# Patient Record
Sex: Male | Born: 1960 | Race: Black or African American | Hispanic: No | Marital: Single | Smoking: Current every day smoker
Health system: Southern US, Community
[De-identification: ages and names within clinical notes are randomized; demographics above are authoritative.]

## PROBLEM LIST (undated history)

## (undated) DIAGNOSIS — F209 Schizophrenia, unspecified: Secondary | ICD-10-CM

## (undated) DIAGNOSIS — I10 Essential (primary) hypertension: Secondary | ICD-10-CM

## (undated) DIAGNOSIS — J449 Chronic obstructive pulmonary disease, unspecified: Secondary | ICD-10-CM

---

## 2002-07-06 ENCOUNTER — Emergency Department (HOSPITAL_COMMUNITY): Admission: EM | Admit: 2002-07-06 | Discharge: 2002-07-06 | Payer: Self-pay | Admitting: Diagnostic Radiology

## 2002-07-06 ENCOUNTER — Encounter: Payer: Self-pay | Admitting: Emergency Medicine

## 2002-10-04 ENCOUNTER — Ambulatory Visit (HOSPITAL_COMMUNITY): Admission: RE | Admit: 2002-10-04 | Discharge: 2002-10-04 | Payer: Self-pay | Admitting: Neurology

## 2002-10-04 ENCOUNTER — Encounter: Payer: Self-pay | Admitting: Neurology

## 2002-10-21 ENCOUNTER — Encounter: Admission: RE | Admit: 2002-10-21 | Discharge: 2002-11-05 | Payer: Self-pay | Admitting: Neurology

## 2003-07-27 ENCOUNTER — Emergency Department (HOSPITAL_COMMUNITY): Admission: EM | Admit: 2003-07-27 | Discharge: 2003-07-27 | Payer: Self-pay | Admitting: Emergency Medicine

## 2004-01-07 ENCOUNTER — Emergency Department (HOSPITAL_COMMUNITY): Admission: EM | Admit: 2004-01-07 | Discharge: 2004-01-08 | Payer: Self-pay | Admitting: Emergency Medicine

## 2004-03-26 ENCOUNTER — Inpatient Hospital Stay (HOSPITAL_COMMUNITY): Admission: EM | Admit: 2004-03-26 | Discharge: 2004-03-31 | Payer: Self-pay | Admitting: Emergency Medicine

## 2004-03-29 ENCOUNTER — Ambulatory Visit: Payer: Self-pay | Admitting: Internal Medicine

## 2004-08-13 ENCOUNTER — Emergency Department (HOSPITAL_COMMUNITY): Admission: EM | Admit: 2004-08-13 | Discharge: 2004-08-13 | Payer: Self-pay | Admitting: Emergency Medicine

## 2006-06-28 ENCOUNTER — Ambulatory Visit: Payer: Self-pay | Admitting: Gastroenterology

## 2006-07-06 ENCOUNTER — Ambulatory Visit (HOSPITAL_COMMUNITY): Admission: RE | Admit: 2006-07-06 | Discharge: 2006-07-06 | Payer: Self-pay | Admitting: Gastroenterology

## 2006-07-10 ENCOUNTER — Ambulatory Visit: Payer: Self-pay | Admitting: Gastroenterology

## 2006-08-23 ENCOUNTER — Ambulatory Visit: Payer: Self-pay | Admitting: Gastroenterology

## 2006-08-30 ENCOUNTER — Ambulatory Visit (HOSPITAL_COMMUNITY): Admission: RE | Admit: 2006-08-30 | Discharge: 2006-08-30 | Payer: Self-pay | Admitting: Gastroenterology

## 2006-09-18 ENCOUNTER — Ambulatory Visit (HOSPITAL_COMMUNITY): Admission: RE | Admit: 2006-09-18 | Discharge: 2006-09-18 | Payer: Self-pay | Admitting: Gastroenterology

## 2006-10-10 ENCOUNTER — Ambulatory Visit: Payer: Self-pay | Admitting: Gastroenterology

## 2009-08-13 ENCOUNTER — Emergency Department (HOSPITAL_COMMUNITY): Admission: EM | Admit: 2009-08-13 | Discharge: 2009-08-13 | Payer: Self-pay | Admitting: Emergency Medicine

## 2010-07-28 ENCOUNTER — Emergency Department (HOSPITAL_COMMUNITY)
Admission: EM | Admit: 2010-07-28 | Discharge: 2010-07-28 | Disposition: A | Discharge status: Home / Self Care | Payer: Medicaid Other | Attending: Emergency Medicine | Admitting: Emergency Medicine

## 2010-07-28 DIAGNOSIS — M545 Low back pain, unspecified: Secondary | ICD-10-CM | POA: Insufficient documentation

## 2010-07-28 LAB — DIFFERENTIAL
Basophils Absolute: 0.1 10*3/uL (ref 0.0–0.1)
Basophils Relative: 1 % (ref 0–1)
Eosinophils Absolute: 0.1 10*3/uL (ref 0.0–0.7)
Eosinophils Relative: 1 % (ref 0–5)
Lymphocytes Relative: 25 % (ref 12–46)
Lymphs Abs: 2.5 10*3/uL (ref 0.7–4.0)
Monocytes Absolute: 0.8 10*3/uL (ref 0.1–1.0)
Monocytes Relative: 8 % (ref 3–12)
Neutro Abs: 6.5 10*3/uL (ref 1.7–7.7)
Neutrophils Relative %: 65 % (ref 43–77)

## 2010-07-28 LAB — URINALYSIS, ROUTINE W REFLEX MICROSCOPIC
Bilirubin Urine: NEGATIVE
Glucose, UA: 100 mg/dL — AB
Glucose, UA: NEGATIVE mg/dL
Hgb urine dipstick: NEGATIVE
Hgb urine dipstick: NEGATIVE
Ketones, ur: 15 mg/dL — AB
Ketones, ur: 15 mg/dL — AB
Leukocytes, UA: NEGATIVE
Nitrite: NEGATIVE
Nitrite: NEGATIVE
Protein, ur: NEGATIVE mg/dL
Protein, ur: 30 mg/dL — AB
Specific Gravity, Urine: 1.017 (ref 1.005–1.030)
Specific Gravity, Urine: 1.03 (ref 1.005–1.030)
Urobilinogen, UA: 1 mg/dL (ref 0.0–1.0)
Urobilinogen, UA: 1 mg/dL (ref 0.0–1.0)
pH: 6 (ref 5.0–8.0)
pH: 6.5 (ref 5.0–8.0)

## 2010-07-28 LAB — CBC
HCT: 47.5 % (ref 39.0–52.0)
Hemoglobin: 15.5 g/dL (ref 13.0–17.0)
MCHC: 32.7 g/dL (ref 30.0–36.0)
MCV: 93.6 fL (ref 78.0–100.0)
Platelets: 124 10*3/uL — ABNORMAL LOW (ref 150–400)
RBC: 5.07 MIL/uL (ref 4.22–5.81)
RDW: 14.2 % (ref 11.5–15.5)
WBC: 10 10*3/uL (ref 4.0–10.5)

## 2010-07-28 LAB — POCT I-STAT, CHEM 8
BUN: 11 mg/dL (ref 6–23)
Calcium, Ion: 1.28 mmol/L (ref 1.12–1.32)
Chloride: 109 mEq/L (ref 96–112)
Creatinine, Ser: 1.2 mg/dL (ref 0.4–1.5)
Glucose, Bld: 88 mg/dL (ref 70–99)
HCT: 52 % (ref 39.0–52.0)
Hemoglobin: 17.7 g/dL — ABNORMAL HIGH (ref 13.0–17.0)
Potassium: 4.5 mEq/L (ref 3.5–5.1)
Sodium: 145 mEq/L (ref 135–145)
TCO2: 29 mmol/L (ref 0–100)

## 2010-07-28 LAB — URINE MICROSCOPIC-ADD ON

## 2010-07-28 LAB — VALPROIC ACID LEVEL: Valproic Acid Lvl: 96.4 ug/mL (ref 50.0–100.0)

## 2010-09-21 NOTE — Op Note (Signed)
NAMELENIX, KIDD             ACCOUNT NO.:  [id number]   MEDICAL RECORD NO.:  [id number]          PATIENT TYPE:  AMB   LOCATION:  ENDO                         FACILITY:  [facility]   PHYSICIAN:  Barbette Hair. Arlyce Dice, MD,FACGDATE OF BIRTH:  06/18/1960   DATE OF PROCEDURE:  09/18/2006  DATE OF DISCHARGE:  09/18/2006                               OPERATIVE REPORT   PROCEDURE:  Esophageal monometry.   HISTORY:  The patient has a history of heartburn, chest discomfort,  dysphagia, and nausea with vomiting.   Esophageal monometry was performed utilizing the usual pull through  technique.   FINDINGS:  LES resting pressure was 26.8 mmHg.  Percent relaxation was  59%.  Residual pressure was 10.9 with normal less than 8 mm.   In the esophageal body, there were 47% peristaltic contractions and 53%  retrograde contractions.  Most contractions were normal amplitude and  duration.  The UES upper esophageal sphincter pressure contractions  during relaxation were normal.   IMPRESSION:  Abnormal study with less than 50% peristaltic contractions  and incomplete relaxation of the lower esophageal sphincter.  Findings  raise the question of achalasia.      Barbette Hair. Arlyce Dice, MD,FACG  Electronically Signed     RDK/MEDQ  D:  10/05/2006  T:  10/05/2006  Job:  [id number]

## 2010-09-24 NOTE — H&P (Signed)
NAMEMOHD., DERFLINGER NO.:  [id number]   MEDICAL RECORD NO.:  [id number]          PATIENT TYPE:  EMS   LOCATION:  ED                           FACILITY:  [facility]   PHYSICIAN:  Michaelyn Barter, M.D. DATE OF BIRTH:  [birth year]   DATE OF ADMISSION:  03/25/2004  DATE OF DISCHARGE:                                HISTORY & PHYSICAL   PRIMARY CARE PHYSICIAN:  Dr. Britt Bottom Blunt.   CHIEF COMPLAINT:  Swollen right leg.   HISTORY OF PRESENT ILLNESS:  The patient is a 50 year old male with a past  medical history of schizophrenia who states that he noticed his right leg  swelling initially 2 days ago.  He denies having any trauma to the right  lower extremity.  However, he states that he does scratch the dorsal surface  of his right foot often.  He states that his distal right leg does not hurt  very much and said that he has never had similar swelling of the leg before.  He denies having any nausea.  No emesis.  No fevers or chills.  No diarrhea  or other changes in his bowel habits.  Again, the patient's primary care  physician is Dr. Mervyn Skeeters. Blunt.   PAST MEDICAL HISTORY:  Schizophrenia.   PAST SURGICAL HISTORY:  None.   ALLERGIES:  None.   FAMILY HISTORY:  Father:  No illnesses.  Mother:  No illnesses.   SOCIAL HISTORY:  Cigarettes:  15 cigarette sticks per day since 1980.  Alcohol:  The patient stopped in 1985.  Street drugs:  The patient denies.   HOME MEDICATIONS:  The patient cannot remember the names of his medications  for his schizophrenia.   REVIEW OF SYSTEMS:  As per HPI.  Otherwise all other systems are negative.   PHYSICAL EXAMINATION:  GENERAL:  No obvious distress.  VITAL SIGNS:  Upon admission, the patient's vitals were recorded as the  following:  Temperature 100.6, heart rate 95, respirations 18, SPO2 99% on  room air.  HEENT:  Muddy sclerae bilaterally.  NECK:  Supple, no lymphadenopathy.  Thyroid not palpable.  CARDIAC:  S1 and S2 present.   Regular rate and rhythm.  No murmurs, no  gallops, no rubs.  RESPIRATORY:  Breath sounds are clear bilaterally.  No crackles.  No  wheezes.  ABDOMEN:  Soft, nontender, nondistended.  Positive bowel sounds.  No  palpable organomegaly.  EXTREMITIES:  Right anterior and lateral distal leg is red/erythematous,  edematous, and warm to touch.  The area of erythema measures approximately  16 cm in length and again is pronounced over the anterior and lateral  surfaces of the right lower extremity.  The left leg does not have any  similar findings.  It appears to be normal in appearance.  The patient has  dry, scaly white plaque-like rash over both of his dorsal feet areas which  is more than likely eczema.   LABORATORY DATA:  White blood cell count 14.3.   ASSESSMENT AND PLAN:  Right leg cellulitis.  The patient received  ceftriaxone while in the emergency room.  Will initiate nafcillin  2 g IV  piggyback q.4h.  Will order blood cultures x2 and provide Tylenol extra-  strength 1-2 tablets p.o. q.8h. p.r.n. for pain.  Continue IV fluid  hydration also.     Orla   OR/MEDQ  D:  03/26/2004  T:  03/26/2004  Job:  [id number]   cc:   Dr. Britt Bottom Blunt

## 2010-09-24 NOTE — Letter (Signed)
June 28, 2006    [name]   RE:  BURLON, CENTRELLA  MRN:  [id number]  /  DOB:  [birth year]   Dear Windy Fast:   It is my pleasure to have treated you recently as a new patient in my  office.  I appreciate your confidence and the opportunity to participate  in your care.   Since I do have a busy inpatient endoscopy schedule and office schedule,  my office hours vary weekly.  I am, however, available for emergency  calls every day through my office.  If I cannot promptly meet an urgent  office appointment, another one of our gastroenterologists will be able  to assist you.   My well-trained staff are prepared to help you at all times.  For  emergencies after office hours, a physician from our gastroenterology  section is always available through my 24-hour answering service.   While you are under my care, I encourage discussion of your questions  and concerns, and I will be happy to return your calls as soon as I am  available.   Once again, I welcome you as a new patient and I look forward to a happy  and healthy relationship.    Sincerely,      Barbette Hair. Arlyce Dice, MD,FACG  Electronically Signed   RDK/MedQ  DD: 06/28/2006  DT: 06/28/2006  Job #: [id number]

## 2010-09-24 NOTE — Assessment & Plan Note (Signed)
[facility]                         GASTROENTEROLOGY OFFICE NOTE   GERAL, COKER                    MRN:          [id number]  DATE:08/23/2006                            DOB:          [birth year]    PROBLEM:  Dysphagia, wheeze.   HISTORY OF PRESENT ILLNESS:  Mr. Iglesia has returned following upper  endoscopy.  Exam demonstrated a distal esophageal stricture that was  dilated to 18 mm.  He also had mild esophagitis.  Mr. Vanover reports  that he is still having difficulty with swallowing.  I believe he is  describing dysphagia.  He also has frequent postprandial vomiting within  5 or 10 minutes or sometimes longer.  It is not preceded by nausea, per  se.  He denies abdominal pain.  He has lost 40 pounds over the last  several months.  He has also complained of some dizziness.   PHYSICAL EXAMINATION:  VITAL SIGNS:  Pulse 96, blood pressure 114.86,  weight 171.  ABDOMEN:  Is without masses, tenderness or organomegaly. There is no  succussion splash.   IMPRESSION:  Persistent dysphagia with immediate postprandial vomiting.  I am suspicious that this may be a functional disorder. Thick stricture  is unlikely as is a motility disorder. Finally, he could have  gastroparesis which ought to be ruled out.   RECOMMENDATIONS:  Barium swallow and gastric emptying scan.     Barbette Hair. Arlyce Dice, MD,FACG  Electronically Signed    RDK/MedQ  DD: 08/23/2006  DT: 08/23/2006  Job #: [id number]   cc:   Clyda Greener

## 2010-09-24 NOTE — Letter (Signed)
June 28, 2006    Clyda Greener, MD   RE:  NYHEIM, SEUFERT  MRN:  [id number]  /  DOB:  1961-04-06   Dear Dr. Bruna Potter:   Upon your kind referral, I had the pleasure of evaluating your patient  and I am pleased to offer my findings.  I saw Mark [name] in the  office today.  Enclosed is a copy of my progress note that details my  findings and recommendations.   Thank you for the opportunity to participate in your patient's care.    Sincerely,      Barbette Hair. Arlyce Dice, MD,FACG  Electronically Signed    RDK/MedQ  DD: 06/28/2006  DT: 06/28/2006  Job #: [id number]

## 2010-09-24 NOTE — Discharge Summary (Signed)
Mark [name], Mark [name]             ACCOUNT NO.:  [id number]   MEDICAL RECORD NO.:  [id number]          PATIENT TYPE:  INP   LOCATION:  0366                         FACILITY:  [facility]   PHYSICIAN:  Jonna L. Robb Matar, M.D.DATE OF BIRTH:  [birth year]   DATE OF ADMISSION:  03/26/2004  DATE OF DISCHARGE:  03/31/2004                                 DISCHARGE SUMMARY   PRIMARY CARE PHYSICIAN:  Dr. Bruna Potter.   FINAL DIAGNOSES:  1.  Right lower extremity cellulitis.  2.  Tenia pedis.  3.  Onychomycosis.  4.  Chronic schizophrenia.  5.  Drug induced Parkinson's disease.   CONSULTATIONS:  Cliffton Asters, M.D.   OPERATION:  None.   ALLERGIES:  None.   HISTORY OF PRESENT ILLNESS:  This 50 year old African-American male had a  two day history of right lower extremity swelling and pain. The patient also  has had a fungal infection on his feet for several weeks. He has had a  previous history of cellulitis in the right thigh in September.   PHYSICAL EXAMINATION:  Notable for Parkinson's tremor, an area on the right  shin that was red, swollen, tender and fluctuant.  Dry, scaly, fungal rash  on both feet right worse than left and bilateral onychomycosis as well.   HOSPITAL COURSE:  The patient was first started on ampicillin IV and then  changed to vancomycin.  He was started on Mycolog cream to the feet with  some improvement in both of these, however, the right shin continued to  remain swollen and tender, white count came down from 14.3 to 10.1.  Dr.  Orvan Falconer was called on infectious disease consult. He agreed with treating  the tinea but suggested using Lamisil instead of Diflucan and so it was  changed.  On November 22, the small abscess finally popped and drained with  decrease in the fluctuance and resolution of the cellulitis.  Gram stains  with Gram positive cocci in pairs.   DISPOSITION:  The patient will be discharged on his previous medications  which were clozapine, propranolol,  Prevacid, lithium, Depakote, Colace,  Zyprexa.  He should take Keflex 500 q.i.d. for a week and Lamisil 250 mg  q.d. for three months.  Prescriptions have been given to both of those.  He  should be seen in followup with mental health.  He can clean the localized area with peroxide, cover it with a nonstick pad  and will ask him to arrange for followup appointment with a regular medical  doctor within a week to double check on the culture.  If the abscess grows  out MRSA, he should be switched to doxycycline 100 mg b.i.d.      JLB/MEDQ  D:  03/31/2004  T:  03/31/2004  Job:  [id number]   cc:   Dr. Bruna Potter

## 2010-09-24 NOTE — Assessment & Plan Note (Signed)
[facility]                         GASTROENTEROLOGY OFFICE NOTE   SNYDER, COLAVITO                    MRN:          [id number]  DATE:06/28/2006                            DOB:          [birth year]    REFERRING PHYSICIAN:  Clyda Greener, MD   REASON FOR CONSULTATION:  Difficulty swallowing.   Mark [name] is a 50 year old African-American male referred through the  courtesy of Dr. Bruna Potter for evaluation. History is limited since the  patient is a poor historian. According to he and his father, he has been  having what sounds like dysphagia to solids. He has had regurgitation of  gastric contents and burning chest discomfort. He has lost 40 pounds in  the past 2-3 months due to poor appetite. They are unsure of his  medications though he is not aware of taking any stomach medicines.   PAST MEDICAL HISTORY:  Pertinent for schizophrenia.   FAMILY HISTORY:  Noncontributory.   MEDICATIONS:  Known medications include lithium, clonazepam, Depakote,  propranolol and Prevacid.   ALLERGIES:  Not known.   He smokes a pack a day, he does not drink. He is single and unemployed.   REVIEW OF SYSTEMS:  Positive for sore throat and back pain.   PHYSICAL EXAMINATION:  VITAL SIGNS:  Pulse 84, blood pressure 112/76,  weight 172.  HEENT: EOMI. PERRLA. Sclerae are anicteric.  Conjunctivae are pink.  NECK:  Supple without thyromegaly, adenopathy or carotid bruits.  CHEST:  Clear to auscultation and percussion without adventitious  sounds.  CARDIAC:  Regular rhythm; normal S1 S2.  There are no murmurs, gallops  or rubs.  ABDOMEN:  Bowel sounds are normoactive.  Abdomen is soft, non-tender and  non-distended.  There are no abdominal masses, tenderness, splenic  enlargement or hepatomegaly.  EXTREMITIES:  Full range of motion.  No cyanosis, clubbing or edema.  RECTAL:  Deferred.   IMPRESSION:  1. Possible dysphagia in the setting of gastroesophageal reflux      disease. A fixed stricture ought to be ruled out.  2. Symptomatic gastroesophageal reflux disease.   RECOMMENDATION:  Upper endoscopy with dilatation as indicated. I will  review his medications again when his father submits them at his next  exam and I will make changes accordingly.     Barbette Hair. Arlyce Dice, MD,FACG  Electronically Signed    RDK/MedQ  DD: 06/28/2006  DT: 06/28/2006  Job #: [id number]   cc:   Clyda Greener, MD

## 2011-04-21 ENCOUNTER — Emergency Department (HOSPITAL_COMMUNITY)
Admission: EM | Admit: 2011-04-21 | Discharge: 2011-04-21 | Disposition: A | Discharge status: Home / Self Care | Payer: Medicaid Other | Attending: Emergency Medicine | Admitting: Emergency Medicine

## 2011-04-21 ENCOUNTER — Emergency Department (HOSPITAL_COMMUNITY): Payer: Medicaid Other

## 2011-04-21 ENCOUNTER — Encounter: Payer: Self-pay | Admitting: *Deleted

## 2011-04-21 DIAGNOSIS — R3589 Other polyuria: Secondary | ICD-10-CM | POA: Insufficient documentation

## 2011-04-21 DIAGNOSIS — R358 Other polyuria: Secondary | ICD-10-CM | POA: Insufficient documentation

## 2011-04-21 DIAGNOSIS — Z79899 Other long term (current) drug therapy: Secondary | ICD-10-CM | POA: Insufficient documentation

## 2011-04-21 DIAGNOSIS — R631 Polydipsia: Secondary | ICD-10-CM | POA: Insufficient documentation

## 2011-04-21 DIAGNOSIS — I10 Essential (primary) hypertension: Secondary | ICD-10-CM | POA: Insufficient documentation

## 2011-04-21 DIAGNOSIS — R5383 Other fatigue: Secondary | ICD-10-CM

## 2011-04-21 DIAGNOSIS — Z8659 Personal history of other mental and behavioral disorders: Secondary | ICD-10-CM | POA: Insufficient documentation

## 2011-04-21 DIAGNOSIS — R079 Chest pain, unspecified: Secondary | ICD-10-CM | POA: Insufficient documentation

## 2011-04-21 DIAGNOSIS — R5381 Other malaise: Secondary | ICD-10-CM | POA: Insufficient documentation

## 2011-04-21 HISTORY — DX: Essential (primary) hypertension: I10

## 2011-04-21 HISTORY — DX: Schizophrenia, unspecified: F20.9

## 2011-04-21 LAB — COMPREHENSIVE METABOLIC PANEL
ALT: 19 U/L (ref 0–53)
AST: 22 U/L (ref 0–37)
Albumin: 3.2 g/dL — ABNORMAL LOW (ref 3.5–5.2)
Alkaline Phosphatase: 63 U/L (ref 39–117)
BUN: 12 mg/dL (ref 6–23)
CO2: 25 mEq/L (ref 19–32)
Calcium: 9.5 mg/dL (ref 8.4–10.5)
Chloride: 108 mEq/L (ref 96–112)
Creatinine, Ser: 1.12 mg/dL (ref 0.50–1.35)
GFR calc Af Amer: 87 mL/min — ABNORMAL LOW (ref >90)
GFR calc non Af Amer: 75 mL/min — ABNORMAL LOW (ref >90)
Glucose, Bld: 102 mg/dL — ABNORMAL HIGH (ref 70–99)
Potassium: 4 mEq/L (ref 3.5–5.1)
Sodium: 141 mEq/L (ref 135–145)
Total Bilirubin: 0.2 mg/dL — ABNORMAL LOW (ref 0.3–1.2)
Total Protein: 6.9 g/dL (ref 6.0–8.3)

## 2011-04-21 LAB — CBC
HCT: 45.8 % (ref 39.0–52.0)
Hemoglobin: 15.1 g/dL (ref 13.0–17.0)
MCH: 29.8 pg (ref 26.0–34.0)
MCHC: 33 g/dL (ref 30.0–36.0)
MCV: 90.3 fL (ref 78.0–100.0)
Platelets: 190 10*3/uL (ref 150–400)
RBC: 5.07 MIL/uL (ref 4.22–5.81)
RDW: 13.6 % (ref 11.5–15.5)
WBC: 7.1 10*3/uL (ref 4.0–10.5)

## 2011-04-21 LAB — DIFFERENTIAL
Basophils Absolute: 0 10*3/uL (ref 0.0–0.1)
Basophils Relative: 0 % (ref 0–1)
Eosinophils Absolute: 0.1 10*3/uL (ref 0.0–0.7)
Eosinophils Relative: 1 % (ref 0–5)
Lymphocytes Relative: 42 % (ref 12–46)
Lymphs Abs: 3 10*3/uL (ref 0.7–4.0)
Monocytes Absolute: 0.6 10*3/uL (ref 0.1–1.0)
Monocytes Relative: 9 % (ref 3–12)
Neutro Abs: 3.4 10*3/uL (ref 1.7–7.7)
Neutrophils Relative %: 47 % (ref 43–77)

## 2011-04-21 LAB — VALPROIC ACID LEVEL: Valproic Acid Lvl: 61.9 ug/mL (ref 50.0–100.0)

## 2011-04-21 LAB — CARDIAC PANEL(CRET KIN+CKTOT+MB+TROPI)
CK, MB: 2.7 ng/mL (ref 0.3–4.0)
CK, MB: 2.9 ng/mL (ref 0.3–4.0)
Relative Index: 0.6 (ref 0.0–2.5)
Relative Index: 0.7 (ref 0.0–2.5)
Total CK: 441 U/L — ABNORMAL HIGH (ref 7–232)
Total CK: 424 U/L — ABNORMAL HIGH (ref 7–232)
Troponin I: <0.3 ng/mL (ref <0.30)
Troponin I: <0.3 ng/mL (ref <0.30)

## 2011-04-21 NOTE — ED Notes (Signed)
Pt aware of need of urine specimen, urinal handed to pt

## 2011-04-21 NOTE — ED Notes (Signed)
Pt undressed, in gown, on monitor, continuous pulse oximetry and blood pressure cuff and EKG performed by Malachi Bonds, NT

## 2011-04-21 NOTE — ED Notes (Signed)
Patient states his legs hurt like walking up stairs a lot and this morning he started feeling very tired and when he went to lay down his chest started to hurt. Patient denies N/V and denies chest pain at this time. Patient states that his speech is normal for him, patient sounds 'thick tounged' when he talks. Patient is talking with staff and resting with NAD at this time.

## 2011-04-21 NOTE — ED Provider Notes (Signed)
History     CSN: [id number] Arrival date & time: 04/21/2011 11:26 AM   First MD Initiated Contact with Patient 04/21/11 1131      Chief Complaint  Patient presents with  . Weakness  . Chest Pain    (Consider location/radiation/quality/duration/timing/severity/associated sxs/prior treatment) HPI Comments: Patient is a 50 year old male coming from [facility] with a history of not acting himself. Patient has a history of schizophrenia and is not a good historian. Apparently per EMS staff says he was not acting himself his normal self which is very reactive and energetic. They state he seemed extremely fatigued and complained to them about vague chest pain. Patient cannot give a number of pain per his chest pain and he denies having any pain currently. He denies any shortness of breath, fever, abdominal pain, headache, localized weakness. He states intermittently his legs will hurt but they don't hurt right now. He denies any cardiac history and states he has not started any new medication.  The history is provided by the patient and the EMS personnel.    Past Medical History  Diagnosis Date  . Schizophrenia   . Hypertension     History reviewed. No pertinent past surgical history.  History reviewed. No pertinent family history.  History  Substance Use Topics  . Smoking status: Never Smoker   . Smokeless tobacco: Not on file  . Alcohol Use: No      Review of Systems  Constitutional: Negative for fever.  HENT: Negative for neck pain and sinus pressure.   Respiratory: Negative for cough and shortness of breath.   Cardiovascular: Positive for chest pain.  Gastrointestinal: Negative for nausea, vomiting and abdominal pain.  Genitourinary: Negative for dysuria.       Polyuria and polydipsia  All other systems reviewed and are negative.    Allergies  Review of patient's allergies indicates no known allergies.  Home Medications   Current Outpatient Rx  Name Route  Sig Dispense Refill  . CLONAZEPAM 1 MG PO TABS Oral Take 1 mg by mouth at bedtime.      [name] Kitchen CLOZAPINE 100 MG PO TABS Oral Take 300 mg by mouth 2 (two) times daily.      [name] Kitchen DIVALPROEX SODIUM ER 500 MG PO TB24 Oral Take 2,000 mg by mouth at bedtime.      [name] Kitchen PROPRANOLOL HCL 20 MG PO TABS Oral Take 20 mg by mouth every morning.        BP 115/77  Pulse 78  Temp(Src) 98.1 F (36.7 C) (Oral)  Resp 16  SpO2 98%  Physical Exam  Nursing note and vitals reviewed. Constitutional: He is oriented to person, place, and time. He appears well-developed and well-nourished. No distress.  HENT:  Head: Normocephalic and atraumatic.  Mouth/Throat: Oropharynx is clear and moist.  Eyes: Conjunctivae and EOM are normal. Pupils are equal, round, and reactive to light.  Neck: Normal range of motion. Neck supple.  Cardiovascular: Normal rate, regular rhythm and intact distal pulses.   No murmur heard. Pulmonary/Chest: Effort normal and breath sounds normal. No respiratory distress. He has no wheezes. He has no rales. He exhibits tenderness. He exhibits no crepitus, no deformity and no swelling.    Abdominal: Soft. He exhibits no distension. There is no tenderness. There is no rebound and no guarding.  Musculoskeletal: Normal range of motion. He exhibits no edema and no tenderness.  Neurological: He is alert and oriented to person, place, and time.  Skin: Skin is warm and dry. No  rash noted. No erythema.  Psychiatric: He has a normal mood and affect. His behavior is normal.    ED Course  Procedures (including critical care time)  Results for orders placed during the hospital encounter of 04/21/11  CBC      Component Value Range   WBC 7.1  4.0 - 10.5 (K/uL)   RBC 5.07  4.22 - 5.81 (MIL/uL)   Hemoglobin 15.1  13.0 - 17.0 (g/dL)   HCT 16.1  09.6 - 04.5 (%)   MCV 90.3  78.0 - 100.0 (fL)   MCH 29.8  26.0 - 34.0 (pg)   MCHC 33.0  30.0 - 36.0 (g/dL)   RDW 40.9  81.1 - 91.4 (%)   Platelets 190  150 - 400  (K/uL)  DIFFERENTIAL      Component Value Range   Neutrophils Relative 47  43 - 77 (%)   Neutro Abs 3.4  1.7 - 7.7 (K/uL)   Lymphocytes Relative 42  12 - 46 (%)   Lymphs Abs 3.0  0.7 - 4.0 (K/uL)   Monocytes Relative 9  3 - 12 (%)   Monocytes Absolute 0.6  0.1 - 1.0 (K/uL)   Eosinophils Relative 1  0 - 5 (%)   Eosinophils Absolute 0.1  0.0 - 0.7 (K/uL)   Basophils Relative 0  0 - 1 (%)   Basophils Absolute 0.0  0.0 - 0.1 (K/uL)  COMPREHENSIVE METABOLIC PANEL      Component Value Range   Sodium 141  135 - 145 (mEq/L)   Potassium 4.0  3.5 - 5.1 (mEq/L)   Chloride 108  96 - 112 (mEq/L)   CO2 25  19 - 32 (mEq/L)   Glucose, Bld 102 (*) 70 - 99 (mg/dL)   BUN 12  6 - 23 (mg/dL)   Creatinine, Ser 7.82  0.50 - 1.35 (mg/dL)   Calcium 9.5  8.4 - 95.6 (mg/dL)   Total Protein 6.9  6.0 - 8.3 (g/dL)   Albumin 3.2 (*) 3.5 - 5.2 (g/dL)   AST 22  0 - 37 (U/L)   ALT 19  0 - 53 (U/L)   Alkaline Phosphatase 63  39 - 117 (U/L)   Total Bilirubin 0.2 (*) 0.3 - 1.2 (mg/dL)   GFR calc non Af Amer 75 (*) >90 (mL/min)   GFR calc Af Amer 87 (*) >90 (mL/min)  CARDIAC PANEL(CRET KIN+CKTOT+MB+TROPI)      Component Value Range   Total CK 441 (*) 7 - 232 (U/L)   CK, MB 2.7  0.3 - 4.0 (ng/mL)   Troponin I <0.30  <0.30 (ng/mL)   Relative Index 0.6  0.0 - 2.5   VALPROIC ACID LEVEL      Component Value Range   Valproic Acid Lvl 61.9  50.0 - 100.0 (ug/mL)  CARDIAC PANEL(CRET KIN+CKTOT+MB+TROPI)      Component Value Range   Total CK 424 (*) 7 - 232 (U/L)   CK, MB 2.9  0.3 - 4.0 (ng/mL)   Troponin I <0.30  <0.30 (ng/mL)   Relative Index 0.7  0.0 - 2.5    Dg Chest 2 View  04/21/2011  *RADIOLOGY REPORT*  Clinical Data: Chest pain, hypertension  CHEST - 2 VIEW  Comparison: July 27, 2003  Findings: The cardiac silhouette, mediastinum, pulmonary vasculature are within normal limits.  Both lungs are clear. There is no acute bony abnormality.  IMPRESSION: There is no evidence of acute cardiac or pulmonary process.   Original Report Authenticated By: Brandon Melnick, M.D.  Date: 04/21/2011  Rate: 73  Rhythm: normal sinus rhythm  QRS Axis: normal  Intervals: normal  ST/T Wave abnormalities: normal  Conduction Disutrbances:none  Narrative Interpretation:   Old EKG Reviewed: none available    No diagnosis found.    MDM   Patient with a history of schizophrenia so no best historian who was brought in [facility] today due to not acting himself. Patient states he feels very tired and had to go lay down. He describes a very vague sensation of pain in his chest but cannot further explained. He states it is mildly tender when I palpate. He denies any shortness of breath, cough, fever, nausea, vomiting but does complain of polydipsia and polyuria. Patient denies any history of cardiac disease and denies any new medications. He is well-appearing on exam with normal vital signs and normal exam. EKG is normal. Will evaluate with CBC, CMP, cardiac enzymes, UA, chest x-ray, Depakote level.  3:09 PM  patient last within normal limits. Cardiac enzymes 0 and 3 hours negative. Patient feels well will discharge home.  Gwyneth Sprout, MD 04/21/11 [phone]

## 2011-04-21 NOTE — ED Notes (Signed)
Patient resting with  NAD at this time. Patient denies chest pain or leg pain.

## 2011-04-21 NOTE — ED Notes (Signed)
EMS-pt is from [facility]. Staff states pt is not acting himself. Pt with hx of schizophrenia. Reports pt is not as active as normal. Pt reports bilateral lower leg pain. Mentioned intermittent chest pain to staff. ekg is unremarkable. 134/76, 70, 16, 99% RA.

## 2011-04-21 NOTE — ED Notes (Signed)
Pt still unable to urinate 

## 2011-04-21 NOTE — ED Notes (Signed)
Patient discharged home.

## 2011-08-29 ENCOUNTER — Encounter (HOSPITAL_COMMUNITY): Payer: Self-pay

## 2011-08-29 ENCOUNTER — Emergency Department (HOSPITAL_COMMUNITY): Payer: Medicaid Other

## 2011-08-29 ENCOUNTER — Emergency Department (HOSPITAL_COMMUNITY)
Admission: EM | Admit: 2011-08-29 | Discharge: 2011-08-29 | Disposition: A | Discharge status: Home / Self Care | Payer: Medicaid Other | Attending: Emergency Medicine | Admitting: Emergency Medicine

## 2011-08-29 DIAGNOSIS — I10 Essential (primary) hypertension: Secondary | ICD-10-CM | POA: Insufficient documentation

## 2011-08-29 DIAGNOSIS — Z79899 Other long term (current) drug therapy: Secondary | ICD-10-CM | POA: Insufficient documentation

## 2011-08-29 DIAGNOSIS — R112 Nausea with vomiting, unspecified: Secondary | ICD-10-CM | POA: Insufficient documentation

## 2011-08-29 DIAGNOSIS — R42 Dizziness and giddiness: Secondary | ICD-10-CM | POA: Insufficient documentation

## 2011-08-29 DIAGNOSIS — R079 Chest pain, unspecified: Secondary | ICD-10-CM | POA: Insufficient documentation

## 2011-08-29 DIAGNOSIS — R05 Cough: Secondary | ICD-10-CM | POA: Insufficient documentation

## 2011-08-29 DIAGNOSIS — J189 Pneumonia, unspecified organism: Secondary | ICD-10-CM | POA: Insufficient documentation

## 2011-08-29 DIAGNOSIS — R509 Fever, unspecified: Secondary | ICD-10-CM | POA: Insufficient documentation

## 2011-08-29 DIAGNOSIS — M546 Pain in thoracic spine: Secondary | ICD-10-CM | POA: Insufficient documentation

## 2011-08-29 DIAGNOSIS — R5381 Other malaise: Secondary | ICD-10-CM | POA: Insufficient documentation

## 2011-08-29 DIAGNOSIS — Z8659 Personal history of other mental and behavioral disorders: Secondary | ICD-10-CM | POA: Insufficient documentation

## 2011-08-29 DIAGNOSIS — R059 Cough, unspecified: Secondary | ICD-10-CM | POA: Insufficient documentation

## 2011-08-29 LAB — URINALYSIS, ROUTINE W REFLEX MICROSCOPIC
Hgb urine dipstick: NEGATIVE
Specific Gravity, Urine: 1.016 (ref 1.005–1.030)
Urobilinogen, UA: 1 mg/dL (ref 0.0–1.0)
pH: 6 (ref 5.0–8.0)

## 2011-08-29 LAB — COMPREHENSIVE METABOLIC PANEL
AST: 17 U/L (ref 0–37)
Albumin: 3.4 g/dL — ABNORMAL LOW (ref 3.5–5.2)
CO2: 25 mEq/L (ref 19–32)
Calcium: 9.7 mg/dL (ref 8.4–10.5)
Creatinine, Ser: 0.99 mg/dL (ref 0.50–1.35)
GFR calc non Af Amer: >90 mL/min (ref >90)
Sodium: 140 mEq/L (ref 135–145)
Total Protein: 7.4 g/dL (ref 6.0–8.3)

## 2011-08-29 LAB — RAPID URINE DRUG SCREEN, HOSP PERFORMED
Benzodiazepines: NOT DETECTED
Opiates: NOT DETECTED

## 2011-08-29 LAB — CBC
MCH: 28.9 pg (ref 26.0–34.0)
MCHC: 33.3 g/dL (ref 30.0–36.0)
MCV: 86.8 fL (ref 78.0–100.0)
Platelets: 238 10*3/uL (ref 150–400)
RDW: 13.9 % (ref 11.5–15.5)

## 2011-08-29 LAB — ETHANOL: Alcohol, Ethyl (B): <11 mg/dL (ref 0–11)

## 2011-08-29 LAB — D-DIMER, QUANTITATIVE: D-Dimer, Quant: <0.22 ug/mL-FEU (ref 0.00–0.48)

## 2011-08-29 MED ORDER — ALBUTEROL SULFATE HFA 108 (90 BASE) MCG/ACT IN AERS
2.0000 | INHALATION_SPRAY | RESPIRATORY_TRACT | Status: DC | PRN
  Administered 2011-08-29: 2 via RESPIRATORY_TRACT
  Filled 2011-08-29: qty 6.7

## 2011-08-29 MED ORDER — SODIUM CHLORIDE 0.9 % IV SOLN
Freq: Once | INTRAVENOUS | Status: AC
  Administered 2011-08-29: 11:00:00 via INTRAVENOUS

## 2011-08-29 MED ORDER — AEROCHAMBER Z-STAT PLUS/MEDIUM MISC
1.0000 | Freq: Once | Status: AC
  Administered 2011-08-29: 1
  Filled 2011-08-29: qty 1

## 2011-08-29 MED ORDER — ALBUTEROL SULFATE (5 MG/ML) 0.5% IN NEBU
5.0000 mg | INHALATION_SOLUTION | Freq: Once | RESPIRATORY_TRACT | Status: AC
  Administered 2011-08-29: 5 mg via RESPIRATORY_TRACT
  Filled 2011-08-29: qty 1

## 2011-08-29 MED ORDER — AZITHROMYCIN 250 MG PO TABS: ORAL_TABLET | ORAL | Status: AC

## 2011-08-29 MED ORDER — SODIUM CHLORIDE 0.9 % IV SOLN: 20.0000 mL | INTRAVENOUS | Status: DC

## 2011-08-29 MED ORDER — IPRATROPIUM BROMIDE 0.02 % IN SOLN
0.5000 mg | Freq: Once | RESPIRATORY_TRACT | Status: AC
  Administered 2011-08-29: 0.5 mg via RESPIRATORY_TRACT
  Filled 2011-08-29: qty 2.5

## 2011-08-29 NOTE — ED Notes (Signed)
PER EMS[facility] with reports of mid upper back pain and central chest pain since 0900 this AM.  324 ASA given PTA.

## 2011-08-29 NOTE — Discharge Instructions (Signed)
Use the inhaler, 2 puffs every 4 hours.    Pneumonia, Adult Pneumonia is an infection of the lungs.  CAUSES Pneumonia may be caused by bacteria or a virus. Usually, these infections are caused by breathing infectious particles into the lungs (respiratory tract). SYMPTOMS   Cough.   Fever.   Chest pain.   Increased rate of breathing.   Wheezing.   Mucus production.  DIAGNOSIS  If you have the common symptoms of pneumonia, your caregiver will typically confirm the diagnosis with a chest X-ray. The X-ray will show an abnormality in the lung (pulmonary infiltrate) if you have pneumonia. Other tests of your blood, urine, or sputum may be done to find the specific cause of your pneumonia. Your caregiver may also do tests (blood gases or pulse oximetry) to see how well your lungs are working. TREATMENT  Some forms of pneumonia may be spread to other people when you cough or sneeze. You may be asked to wear a mask before and during your exam. Pneumonia that is caused by bacteria is treated with antibiotic medicine. Pneumonia that is caused by the influenza virus may be treated with an antiviral medicine. Most other viral infections must run their course. These infections will not respond to antibiotics.  PREVENTION A pneumococcal shot (vaccine) is available to prevent a common bacterial cause of pneumonia. This is usually suggested for:  People over 76 years old.   Patients on chemotherapy.   People with chronic lung problems, such as bronchitis or emphysema.   People with immune system problems.  If you are over 65 or have a high risk condition, you may receive the pneumococcal vaccine if you have not received it before. In some countries, a routine influenza vaccine is also recommended. This vaccine can help prevent some cases of pneumonia.You may be offered the influenza vaccine as part of your care. If you smoke, it is time to quit. You may receive instructions on how to stop smoking.  Your caregiver can provide medicines and counseling to help you quit. HOME CARE INSTRUCTIONS   Cough suppressants may be used if you are losing too much rest. However, coughing protects you by clearing your lungs. You should avoid using cough suppressants if you can.   Your caregiver may have prescribed medicine if he or she thinks your pneumonia is caused by a bacteria or influenza. Finish your medicine even if you start to feel better.   Your caregiver may also prescribe an expectorant. This loosens the mucus to be coughed up.   Only take over-the-counter or prescription medicines for pain, discomfort, or fever as directed by your caregiver.   Do not smoke. Smoking is a common cause of bronchitis and can contribute to pneumonia. If you are a smoker and continue to smoke, your cough may last several weeks after your pneumonia has cleared.   A cold steam vaporizer or humidifier in your room or home may help loosen mucus.   Coughing is often worse at night. Sleeping in a semi-upright position in a recliner or using a couple pillows under your head will help with this.   Get rest as you feel it is needed. Your body will usually let you know when you need to rest.  SEEK IMMEDIATE MEDICAL CARE IF:   Your illness becomes worse. This is especially true if you are elderly or weakened from any other disease.   You cannot control your cough with suppressants and are losing sleep.   You begin coughing up  blood.   You develop pain which is getting worse or is uncontrolled with medicines.   You have a fever.   Any of the symptoms which initially brought you in for treatment are getting worse rather than better.   You develop shortness of breath or chest pain.  MAKE SURE YOU:   Understand these instructions.   Will watch your condition.   Will get help right away if you are not doing well or get worse.  Document Released: 04/25/2005 Document Revised: 04/14/2011 Document Reviewed:  07/15/2010 [facility] Patient Information 2012 [place], [place].Pneumonia, Adult Pneumonia is an infection of the lungs.  CAUSES Pneumonia may be caused by bacteria or a virus. Usually, these infections are caused by breathing infectious particles into the lungs (respiratory tract). SYMPTOMS   Cough.   Fever.   Chest pain.   Increased rate of breathing.   Wheezing.   Mucus production.  DIAGNOSIS  If you have the common symptoms of pneumonia, your caregiver will typically confirm the diagnosis with a chest X-ray. The X-ray will show an abnormality in the lung (pulmonary infiltrate) if you have pneumonia. Other tests of your blood, urine, or sputum may be done to find the specific cause of your pneumonia. Your caregiver may also do tests (blood gases or pulse oximetry) to see how well your lungs are working. TREATMENT  Some forms of pneumonia may be spread to other people when you cough or sneeze. You may be asked to wear a mask before and during your exam. Pneumonia that is caused by bacteria is treated with antibiotic medicine. Pneumonia that is caused by the influenza virus may be treated with an antiviral medicine. Most other viral infections must run their course. These infections will not respond to antibiotics.  PREVENTION A pneumococcal shot (vaccine) is available to prevent a common bacterial cause of pneumonia. This is usually suggested for:  People over 78 years old.   Patients on chemotherapy.   People with chronic lung problems, such as bronchitis or emphysema.   People with immune system problems.  If you are over 65 or have a high risk condition, you may receive the pneumococcal vaccine if you have not received it before. In some countries, a routine influenza vaccine is also recommended. This vaccine can help prevent some cases of pneumonia.You may be offered the influenza vaccine as part of your care. If you smoke, it is time to quit. You may receive instructions on how  to stop smoking. Your caregiver can provide medicines and counseling to help you quit. HOME CARE INSTRUCTIONS   Cough suppressants may be used if you are losing too much rest. However, coughing protects you by clearing your lungs. You should avoid using cough suppressants if you can.   Your caregiver may have prescribed medicine if he or she thinks your pneumonia is caused by a bacteria or influenza. Finish your medicine even if you start to feel better.   Your caregiver may also prescribe an expectorant. This loosens the mucus to be coughed up.   Only take over-the-counter or prescription medicines for pain, discomfort, or fever as directed by your caregiver.   Do not smoke. Smoking is a common cause of bronchitis and can contribute to pneumonia. If you are a smoker and continue to smoke, your cough may last several weeks after your pneumonia has cleared.   A cold steam vaporizer or humidifier in your room or home may help loosen mucus.   Coughing is often worse at night. Sleeping  in a semi-upright position in a recliner or using a couple pillows under your head will help with this.   Get rest as you feel it is needed. Your body will usually let you know when you need to rest.  SEEK IMMEDIATE MEDICAL CARE IF:   Your illness becomes worse. This is especially true if you are elderly or weakened from any other disease.   You cannot control your cough with suppressants and are losing sleep.   You begin coughing up blood.   You develop pain which is getting worse or is uncontrolled with medicines.   You have a fever.   Any of the symptoms which initially brought you in for treatment are getting worse rather than better.   You develop shortness of breath or chest pain.  MAKE SURE YOU:   Understand these instructions.   Will watch your condition.   Will get help right away if you are not doing well or get worse.  Document Released: 04/25/2005 Document Revised: 04/14/2011 Document  Reviewed: 07/15/2010 [facility] Patient Information 2012 [place], [place].

## 2011-08-29 NOTE — ED Notes (Signed)
Jaundice noted to bilateral eyes.  Patient reporting mid chest pain with radiation to mid upper back. Vitals stable, no acute distress noted; patient resting quietly

## 2011-08-29 NOTE — ED Provider Notes (Signed)
History     CSN: [id number]  Arrival date & time 08/29/11  7846   First MD Initiated Contact with Patient 08/29/11 1007      Chief Complaint  Patient presents with  . Chest Pain  . Back Pain    (Consider location/radiation/quality/duration/timing/severity/associated sxs/prior treatment) HPI Comments: Mark [name] is a 51 y.o. Male who was on his way to his group home today on a bus when he developed chest pain. The chest pain is only with deep inspiration and is felt radiating to the mid upper back. He denies shortness of. He does not have persistent chest pain. He had a fever, cough, weakness, dizziness, nausea, vomiting. He is not taking medication for the problem. He's had this problem previously. He is unaware of his usual medicines, but states he is taking them.  Patient is a 51 y.o. male presenting with chest pain and back pain. The history is provided by the patient.  Chest Pain    Back Pain  Associated symptoms include chest pain.    Past Medical History  Diagnosis Date  . Schizophrenia   . Hypertension     History reviewed. No pertinent past surgical history.  No family history on file.  History  Substance Use Topics  . Smoking status: Never Smoker   . Smokeless tobacco: Not on file  . Alcohol Use: No      Review of Systems  Cardiovascular: Positive for chest pain.  Musculoskeletal: Positive for back pain.  All other systems reviewed and are negative.    Allergies  Review of patient's allergies indicates no known allergies.  Home Medications   Current Outpatient Rx  Name Route Sig Dispense Refill  . AZITHROMYCIN 250 MG PO TABS  2 tabs on 08/29/11, then one each until gone beginning 08/30/11 6 each 0  . CLONAZEPAM 1 MG PO TABS Oral Take 1 mg by mouth at bedtime.      [name] Kitchen CLOZAPINE 100 MG PO TABS Oral Take 300 mg by mouth 2 (two) times daily.      [name] Kitchen DIVALPROEX SODIUM ER 500 MG PO TB24 Oral Take 2,000 mg by mouth at bedtime.      [name] Kitchen PROPRANOLOL  HCL 20 MG PO TABS Oral Take 20 mg by mouth every morning.        BP 142/95  Pulse 83  Temp(Src) 97.7 F (36.5 C) (Oral)  Resp 16  SpO2 100%  Physical Exam  Nursing note and vitals reviewed. Constitutional: He is oriented to person, place, and time. He appears well-developed and well-nourished.  HENT:  Head: Normocephalic and atraumatic.  Right Ear: External ear normal.  Left Ear: External ear normal.  Eyes: Conjunctivae and EOM are normal. Pupils are equal, round, and reactive to light.  Neck: Normal range of motion and phonation normal. Neck supple.  Cardiovascular: Normal rate, regular rhythm, normal heart sounds and intact distal pulses.   Pulmonary/Chest: Effort normal. He exhibits no tenderness and no bony tenderness.       He takes shallow breaths secondary to pain with inspiration. He has decreased breath sounds bilaterally. There are no audible wheezes.  Abdominal: Soft. Normal appearance. There is no tenderness.  Musculoskeletal: Normal range of motion.       No tenderness of the thoracic spine or paravertebral musculature  Neurological: He is alert and oriented to person, place, and time. He has normal strength. No cranial nerve deficit or sensory deficit. He exhibits normal muscle tone. Coordination normal.  Skin: Skin is warm,  dry and intact.  Psychiatric: He has a normal mood and affect. His behavior is normal. Judgment and thought content normal.    ED Course  Procedures (including critical care time)   Date: 08/29/2011  Rate: 91  Rhythm: normal sinus rhythm  QRS Axis: normal  Intervals: normal  ST/T Wave abnormalities: nonspecific ST changes  Conduction Disutrbances:none  Narrative Interpretation:   Old EKG Reviewed: unchanged Department treatment: Nebulizer with albuterol and Atrovent 1:54 PM Reevaluation with update and discussion. After initial assessment and treatment, an updated evaluation reveals he is now able to breathe without having pain.  Ambra Haverstick L    Labs Reviewed  CBC - Abnormal; Notable for the following:    WBC 11.4 (*)    All other components within normal limits  COMPREHENSIVE METABOLIC PANEL - Abnormal; Notable for the following:    Glucose, Bld 113 (*)    Albumin 3.4 (*)    All other components within normal limits  URINALYSIS, ROUTINE W REFLEX MICROSCOPIC  D-DIMER, QUANTITATIVE  ETHANOL  URINE RAPID DRUG SCREEN (HOSP PERFORMED)   Dg Chest 2 View  08/29/2011  *RADIOLOGY REPORT*  Clinical Data: Chest and back pain.  CHEST - 2 VIEW  Comparison: 04/21/2011.  Findings: Small amount of airspace opacity in the inferior right upper lobe, laterally.  Otherwise, clear lungs.  Normal sized heart.  Unremarkable bones.  IMPRESSION: Right upper lobe pneumonia.  Original Report Authenticated By: Darrol Angel, M.D.     1. Community acquired pneumonia       MDM  Her pneumonia with bronchospasm. Doubt serious bacterial illness, metabolic instability or pulmonary embolus.  Plan: Home Medications- Zithromax z Pack; Home Treatments- Inhaler; Recommended follow up- PCP 1 week      Flint Melter, MD 08/29/11 1356

## 2014-03-11 ENCOUNTER — Encounter (HOSPITAL_COMMUNITY): Payer: Self-pay | Admitting: Emergency Medicine

## 2014-03-11 DIAGNOSIS — Y9301 Activity, walking, marching and hiking: Secondary | ICD-10-CM | POA: Insufficient documentation

## 2014-03-11 DIAGNOSIS — S8991XA Unspecified injury of right lower leg, initial encounter: Secondary | ICD-10-CM | POA: Diagnosis present

## 2014-03-11 DIAGNOSIS — Y9289 Other specified places as the place of occurrence of the external cause: Secondary | ICD-10-CM | POA: Insufficient documentation

## 2014-03-11 DIAGNOSIS — F209 Schizophrenia, unspecified: Secondary | ICD-10-CM | POA: Insufficient documentation

## 2014-03-11 DIAGNOSIS — Z79899 Other long term (current) drug therapy: Secondary | ICD-10-CM | POA: Diagnosis not present

## 2014-03-11 DIAGNOSIS — X58XXXA Exposure to other specified factors, initial encounter: Secondary | ICD-10-CM | POA: Insufficient documentation

## 2014-03-11 DIAGNOSIS — S86911A Strain of unspecified muscle(s) and tendon(s) at lower leg level, right leg, initial encounter: Secondary | ICD-10-CM | POA: Diagnosis not present

## 2014-03-11 DIAGNOSIS — I10 Essential (primary) hypertension: Secondary | ICD-10-CM | POA: Diagnosis not present

## 2014-03-11 NOTE — ED Notes (Addendum)
Pt states that he has had sudden R knee pain x 3 hours. Alert and oriented. Pt from [facility]. [address] [place], [place]

## 2014-03-12 ENCOUNTER — Emergency Department (HOSPITAL_COMMUNITY)
Admission: EM | Admit: 2014-03-12 | Discharge: 2014-03-12 | Disposition: A | Discharge status: Home / Self Care | Payer: Medicaid Other | Attending: Emergency Medicine | Admitting: Emergency Medicine

## 2014-03-12 ENCOUNTER — Emergency Department (HOSPITAL_COMMUNITY): Payer: Medicaid Other

## 2014-03-12 DIAGNOSIS — S86911A Strain of unspecified muscle(s) and tendon(s) at lower leg level, right leg, initial encounter: Secondary | ICD-10-CM

## 2014-03-12 DIAGNOSIS — M25561 Pain in right knee: Secondary | ICD-10-CM

## 2014-03-12 DIAGNOSIS — M25461 Effusion, right knee: Secondary | ICD-10-CM

## 2014-03-12 MED ORDER — HYDROCODONE-ACETAMINOPHEN 5-325 MG PO TABS
1.0000 | ORAL_TABLET | Freq: Once | ORAL | Status: AC
  Administered 2014-03-12: 1 via ORAL
  Filled 2014-03-12: qty 1

## 2014-03-12 MED ORDER — DICLOFENAC SODIUM 1 % TD GEL: 4.0000 g | Freq: Four times a day (QID) | TRANSDERMAL | Status: DC

## 2014-03-12 MED ORDER — HYDROCODONE-ACETAMINOPHEN 5-325 MG PO TABS: 1.0000 | ORAL_TABLET | Freq: Four times a day (QID) | ORAL | Status: DC | PRN

## 2014-03-12 NOTE — Discharge Instructions (Signed)
Knee Sprain °A knee sprain is a tear in one of the strong, fibrous tissues that connect the bones (ligaments) in your knee. The severity of the sprain depends on how much of the ligament is torn. The tear can be either partial or complete. °CAUSES  °Often, sprains are a result of a fall or injury. The force of the impact causes the fibers of your ligament to stretch too much. This excess tension causes the fibers of your ligament to tear. °SIGNS AND SYMPTOMS  °You may have some loss of motion in your knee. Other symptoms include: °· Bruising. °· Pain in the knee area. °· Tenderness of the knee to the touch. °· Swelling. °DIAGNOSIS  °To diagnose a knee sprain, your health care provider will physically examine your knee. Your health care provider may also suggest an X-ray exam of your knee to make sure no bones are broken. °TREATMENT  °If your ligament is only partially torn, treatment usually involves keeping the knee in a fixed position (immobilization) or bracing your knee for activities that require movement for several weeks. To do this, your health care provider will apply a bandage, cast, or splint to keep your knee from moving and to support your knee during movement until it heals. For a partially torn ligament, the healing process usually takes 4-6 weeks. °If your ligament is completely torn, depending on which ligament it is, you may need surgery to reconnect the ligament to the bone or reconstruct it. After surgery, a cast or splint may be applied and will need to stay on your knee for 4-6 weeks while your ligament heals. °HOME CARE INSTRUCTIONS °· Keep your injured knee elevated to decrease swelling. °· To ease pain and swelling, apply ice to the injured area: °¨ Put ice in a plastic bag. °¨ Place a towel between your skin and the bag. °¨ Leave the ice on for 20 minutes, 2-3 times a day. °· Only take medicine for pain as directed by your health care provider. °· Do not leave your knee unprotected until  pain and stiffness go away (usually 4-6 weeks). °· If you have a cast or splint, do not allow it to get wet. If you have been instructed not to remove it, cover it with a plastic bag when you shower or bathe. Do not swim. °· Your health care provider may suggest exercises for you to do during your recovery to prevent or limit permanent weakness and stiffness. °SEEK IMMEDIATE MEDICAL CARE IF: °· Your cast or splint becomes damaged. °· Your pain becomes worse. °· You have significant pain, swelling, or numbness below the cast or splint. °MAKE SURE YOU: °· Understand these instructions. °· Will watch your condition. °· Will get help right away if you are not doing well or get worse. °Document Released: 04/25/2005 Document Revised: 02/13/2013 Document Reviewed: 12/05/2012 °[employer]® Patient Information ©2015 [employer]. This information is not intended to replace advice given to you by your health care provider. Make sure you discuss any questions you have with your health care provider. ° ° ° °RICE: Routine Care for Injuries °The routine care of many injuries includes Rest, Ice, Compression, and Elevation (RICE). °HOME CARE INSTRUCTIONS °· Rest is needed to allow your body to heal. Routine activities can usually be resumed when comfortable. Injured tendons and bones can take up to 6 weeks to heal. Tendons are the cord-like structures that attach muscle to bone. °· Ice following an injury helps keep the swelling down and reduces pain. °¨   Put ice in a plastic bag. °¨ Place a towel between your skin and the bag. °¨ Leave the ice on for 15-20 minutes, 3-4 times a day, or as directed by your health care provider. Do this while awake, for the first 24 to 48 hours. After that, continue as directed by your caregiver. °· Compression helps keep swelling down. It also gives support and helps with discomfort. If an elastic bandage has been applied, it should be removed and reapplied every 3 to 4 hours. It should not be applied  tightly, but firmly enough to keep swelling down. Watch fingers or toes for swelling, bluish discoloration, coldness, numbness, or excessive pain. If any of these problems occur, remove the bandage and reapply loosely. Contact your caregiver if these problems continue. °· Elevation helps reduce swelling and decreases pain. With extremities, such as the arms, hands, legs, and feet, the injured area should be placed near or above the level of the heart, if possible. °SEEK IMMEDIATE MEDICAL CARE IF: °· You have persistent pain and swelling. °· You develop redness, numbness, or unexpected weakness. °· Your symptoms are getting worse rather than improving after several days. °These symptoms may indicate that further evaluation or further X-rays are needed. Sometimes, X-rays may not show a small broken bone (fracture) until 1 week or 10 days later. Make a follow-up appointment with your caregiver. Ask when your X-ray results will be ready. Make sure you get your X-ray results. °Document Released: 08/07/2000 Document Revised: 04/30/2013 Document Reviewed: 09/24/2010 °[employer]® Patient Information ©2015 [employer]. This information is not intended to replace advice given to you by your health care provider. Make sure you discuss any questions you have with your health care provider. ° °

## 2014-03-12 NOTE — ED Notes (Signed)
Pt is here with caregiver from group home where pt resides,he c/o right knee swelling and pain that gets worse with movement. Pt's caregiver is very upset that pt had to sit in the waiting room and that he is in hallway bed instead of room. Apologized to pt and caregiver for longer than usual wait time, pt voiced understanding, however caregiver is still upset and is trying to convince pt to leave.

## 2014-03-12 NOTE — ED Notes (Signed)
Please call Ms Melvyn NethLewis from [facility]  when pt is ready for discharge. [phone]

## 2014-03-12 NOTE — ED Provider Notes (Signed)
CSN: [id number]     Arrival date & time 03/11/14  2343 History   First MD Initiated Contact with Patient 03/12/14 0100     Chief Complaint  Patient presents with  . Knee Pain    (Consider location/radiation/quality/duration/timing/severity/associated sxs/prior Treatment) HPI Comments: Patient is a 53 year old male who presents to the emergency department for further evaluation of 3 hours of right knee pain. Patient states that he was walking when he felt his knee give slightly. This was followed by onset of pain. Pain is worse with movement, especially flexion. It is also worse when weightbearing and ambulating. Patient denies taking any medications for symptoms prior to arrival. He states that  The pain will intermittently travel to his medial right thigh. Patient denies associated fever, direct trauma or injury to knee, prior knee injuries, extremity numbness or paresthesias, and weakness.  Patient is a 53 y.o. male presenting with knee pain. The history is provided by the patient. No language interpreter was used.  Knee Pain Associated symptoms: no fever     Past Medical History  Diagnosis Date  . Schizophrenia   . Hypertension    History reviewed. No pertinent past surgical history. History reviewed. No pertinent family history. History  Substance Use Topics  . Smoking status: Never Smoker   . Smokeless tobacco: Not on file  . Alcohol Use: No    Review of Systems  Constitutional: Negative for fever.  Musculoskeletal: Positive for arthralgias. Negative for joint swelling.  Skin: Negative for color change.  Neurological: Negative for weakness and numbness.  All other systems reviewed and are negative.   Allergies  Review of patient's allergies indicates no known allergies.  Home Medications   Prior to Admission medications   Medication Sig Start Date End Date Taking? Authorizing Provider  atropine 1 % ophthalmic solution Place 2 drops under the tongue daily. For  drooling   Yes Historical Provider, MD  cloZAPine (CLOZARIL) 100 MG tablet Take 300 mg by mouth 2 (two) times daily.     Yes Historical Provider, MD  cyclobenzaprine (FLEXERIL) 5 MG tablet Take 5 mg by mouth at bedtime as needed (for pain.).   Yes Historical Provider, MD  divalproex (DEPAKOTE ER) 250 MG 24 hr tablet Take 500 mg by mouth at bedtime.   Yes Historical Provider, MD  ibuprofen (ADVIL,MOTRIN) 800 MG tablet Take 800 mg by mouth every 8 (eight) hours as needed (for pain.).   Yes Historical Provider, MD  omeprazole (PRILOSEC) 20 MG capsule Take 20 mg by mouth daily.   Yes Historical Provider, MD  propranolol (INDERAL) 20 MG tablet Take 10 mg by mouth every morning.    Yes Historical Provider, MD  clonazePAM (KLONOPIN) 1 MG tablet Take 1 mg by mouth at bedtime.      Historical Provider, MD  diclofenac sodium (VOLTAREN) 1 % GEL Apply 4 g topically 4 (four) times daily. Apply to knee joint 03/12/14   Antony MaduraKelly Haji Delaine, PA-C  divalproex (DEPAKOTE ER) 500 MG 24 hr tablet Take 2,000 mg by mouth at bedtime.      Historical Provider, MD  HYDROcodone-acetaminophen (NORCO/VICODIN) 5-325 MG per tablet Take 1 tablet by mouth every 6 (six) hours as needed for moderate pain or severe pain. 03/12/14   Antony MaduraKelly Karlee Staff, PA-C   BP 148/89 mmHg  Pulse 89  Temp(Src) 97.6 F (36.4 C) (Oral)  Resp 22  SpO2 94%   Physical Exam  Constitutional: He is oriented to person, place, and time. He appears well-developed and well-nourished. No  distress.  Nontoxic/nonseptic appearing  HENT:  Head: Normocephalic and atraumatic.  Eyes: Conjunctivae and EOM are normal. No scleral icterus.  Neck: Normal range of motion.  Cardiovascular: Normal rate, regular rhythm and intact distal pulses.   DP and PT pulses 2+ in right lower extremity  Pulmonary/Chest: Effort normal. No respiratory distress.  Musculoskeletal: Normal range of motion. He exhibits tenderness.  Tenderness to palpation to medial right knee joint. Patient has near  full passive range of motion with decreased active range of motion with flexion of right knee. No redness, effusion, crepitus, deformity, or swelling noted.  Neurological: He is alert and oriented to person, place, and time. He exhibits normal muscle tone. Coordination normal.  Sensation to light touch intact. Patient ambulatory with antalgic gait.  Skin: Skin is warm and dry. No rash noted. He is not diaphoretic. No erythema. No pallor.  Psychiatric: He has a normal mood and affect. His behavior is normal.  Nursing note and vitals reviewed.   ED Course  Procedures (including critical care time) Labs Review Labs Reviewed - No data to display  Imaging Review Dg Knee Complete 4 Views Right  03/12/2014   CLINICAL DATA:  Pain and swelling of the right knee. Pain at the patella area.  EXAM: RIGHT KNEE - COMPLETE 4+ VIEW  COMPARISON:  03/29/2004  FINDINGS: Negative for fracture or dislocation. Question a small joint effusion. Degenerative changes involving the patellofemoral compartment of the knee.  IMPRESSION: Mild degenerative changes.  No acute bone abnormality.   Electronically Signed   By: Richarda OverlieAdam  Henn M.D.   On: 03/12/2014 01:53     EKG Interpretation None      MDM   Final diagnoses:  Pain and swelling of right knee  Knee strain, right, initial encounter    53 year old male presents to the emergency department for further evaluation of right knee pain. Pain began after knee shifted while ambulating. Patient has had pain since this time which is worse with flexion. He is neurovascularly intact on exam. No evidence of septic joint. No evidence of crepitus, effusion, or deformity. Imaging is negative today. Patient has been able to ambulate in the ED unassisted. He will be given knee sleeve for comfort and stability. Have offered crutches which patient declines. He will be sent home with Voltaren gel prescription and Norco for pain as needed. Return precautions discussed and provided.  Patient is agreeable to plan with no unaddressed concerns. Patient discharged in good condition; VSS.   Filed Vitals:   03/12/14 0012 03/12/14 0240  BP: 151/98 148/89  Pulse: 114 89  Temp: 98 F (36.7 C) 97.6 F (36.4 C)  TempSrc: Oral Oral  Resp: 16 22  SpO2: 96% 94%     Antony MaduraKelly Durward Matranga, PA-C 03/12/14 [id number]  Derwood KaplanAnkit Nanavati, MD 03/12/14 2225

## 2014-03-18 ENCOUNTER — Emergency Department (HOSPITAL_COMMUNITY)
Admission: EM | Admit: 2014-03-18 | Discharge: 2014-03-18 | Disposition: A | Discharge status: Home / Self Care | Payer: Medicaid Other | Source: Home / Self Care | Attending: Family Medicine | Admitting: Family Medicine

## 2014-03-18 ENCOUNTER — Encounter (HOSPITAL_COMMUNITY): Payer: Self-pay | Admitting: Emergency Medicine

## 2014-03-18 DIAGNOSIS — M545 Low back pain, unspecified: Secondary | ICD-10-CM

## 2014-03-18 MED ORDER — CYCLOBENZAPRINE HCL 5 MG PO TABS: 5.0000 mg | ORAL_TABLET | Freq: Every evening | ORAL | Status: DC | PRN

## 2014-03-18 MED ORDER — IBUPROFEN 800 MG PO TABS
800.0000 mg | ORAL_TABLET | Freq: Three times a day (TID) | ORAL | Status: DC | PRN
Start: 2014-03-18 — End: 2017-09-19

## 2014-03-18 NOTE — ED Provider Notes (Signed)
Al CorpusRonald E Lipsett is a 53 y.o. male who presents to Urgent Care today for Low back pain. Patient has a one-day history of mild diffuse low back pain. No radiating pain weakness or numbness. No medications tried yet. No bowel bladder dysfunction or difficulty walking. Pain is worse with activity.   Past Medical History  Diagnosis Date  . Schizophrenia   . Hypertension    History reviewed. No pertinent past surgical history. History  Substance Use Topics  . Smoking status: Never Smoker   . Smokeless tobacco: Not on file  . Alcohol Use: No   ROS as above Medications: No current facility-administered medications for this encounter.   Current Outpatient Prescriptions  Medication Sig Dispense Refill  . atropine 1 % ophthalmic solution Place 2 drops under the tongue daily. For drooling    . clonazePAM (KLONOPIN) 1 MG tablet Take 1 mg by mouth at bedtime.      . cloZAPine (CLOZARIL) 100 MG tablet Take 300 mg by mouth 2 (two) times daily.      . cyclobenzaprine (FLEXERIL) 5 MG tablet Take 1 tablet (5 mg total) by mouth at bedtime as needed (for pain.). 30 tablet 0  . diclofenac sodium (VOLTAREN) 1 % GEL Apply 4 g topically 4 (four) times daily. Apply to knee joint 100 g 0  . divalproex (DEPAKOTE ER) 250 MG 24 hr tablet Take 500 mg by mouth at bedtime.    . divalproex (DEPAKOTE ER) 500 MG 24 hr tablet Take 2,000 mg by mouth at bedtime.      [name] Kitchen. HYDROcodone-acetaminophen (NORCO/VICODIN) 5-325 MG per tablet Take 1 tablet by mouth every 6 (six) hours as needed for moderate pain or severe pain. 7 tablet 0  . ibuprofen (ADVIL,MOTRIN) 800 MG tablet Take 1 tablet (800 mg total) by mouth every 8 (eight) hours as needed (for pain.). 30 tablet 0  . omeprazole (PRILOSEC) 20 MG capsule Take 20 mg by mouth daily.    . propranolol (INDERAL) 20 MG tablet Take 10 mg by mouth every morning.      No Known Allergies   Exam:  BP 135/90 mmHg  Pulse 77  Temp(Src) 98.3 F (36.8 C) (Oral)  Resp 16  SpO2  100% Gen: Well NAD Back: nontender to spinal midline. Mildly tender bilateral lower extremities Normal back range of motion. Negative straight leg raise test bilaterally. Normal lower extremity strength. Reflexes are equal and intact bilaterally Sensation is intact throughout  No results found for this or any previous visit (from the past 24 hour(s)). No results found.  Assessment and Plan: 53 y.o. male with lumbago. Treatment with diclofenac and ibuprofen  Discussed warning signs or symptoms. Please see discharge instructions. Patient expresses understanding.     Rodolph BongEvan S Naquita Nappier, MD 03/18/14 [phone]

## 2014-03-18 NOTE — ED Notes (Signed)
Pt is here today for lower back pain that started today, pt said that the pain is in his tailbone area

## 2014-03-18 NOTE — Discharge Instructions (Signed)
Thank you for coming in today. Restart ibuprofen and cyclobenzaprine as needed. Follow-up with primary care doctor. Come back or go to the emergency room if you notice new weakness new numbness problems walking or bowel or bladder problems.    Back Exercises These exercises may help you when beginning to rehabilitate your injury. Your symptoms may resolve with or without further involvement from your physician, physical therapist or athletic trainer. While completing these exercises, remember:   Restoring tissue flexibility helps normal motion to return to the joints. This allows healthier, less painful movement and activity.  An effective stretch should be held for at least 30 seconds.  A stretch should never be painful. You should only feel a gentle lengthening or release in the stretched tissue. STRETCH - Extension, Prone on Elbows   Lie on your stomach on the floor, a bed will be too soft. Place your palms about shoulder width apart and at the height of your head.  Place your elbows under your shoulders. If this is too painful, stack pillows under your chest.  Allow your body to relax so that your hips drop lower and make contact more completely with the floor.  Hold this position for __________ seconds.  Slowly return to lying flat on the floor. Repeat __________ times. Complete this exercise __________ times per day.  RANGE OF MOTION - Extension, Prone Press Ups   Lie on your stomach on the floor, a bed will be too soft. Place your palms about shoulder width apart and at the height of your head.  Keeping your back as relaxed as possible, slowly straighten your elbows while keeping your hips on the floor. You may adjust the placement of your hands to maximize your comfort. As you gain motion, your hands will come more underneath your shoulders.  Hold this position __________ seconds.  Slowly return to lying flat on the floor. Repeat __________ times. Complete this exercise  __________ times per day.  RANGE OF MOTION- Quadruped, Neutral Spine   Assume a hands and knees position on a firm surface. Keep your hands under your shoulders and your knees under your hips. You may place padding under your knees for comfort.  Drop your head and point your tail bone toward the ground below you. This will round out your low back like an angry cat. Hold this position for __________ seconds.  Slowly lift your head and release your tail bone so that your back sags into a large arch, like an old horse.  Hold this position for __________ seconds.  Repeat this until you feel limber in your low back.  Now, find your "sweet spot." This will be the most comfortable position somewhere between the two previous positions. This is your neutral spine. Once you have found this position, tense your stomach muscles to support your low back.  Hold this position for __________ seconds. Repeat __________ times. Complete this exercise __________ times per day.  STRETCH - Flexion, Single Knee to Chest   Lie on a firm bed or floor with both legs extended in front of you.  Keeping one leg in contact with the floor, bring your opposite knee to your chest. Hold your leg in place by either grabbing behind your thigh or at your knee.  Pull until you feel a gentle stretch in your low back. Hold __________ seconds.  Slowly release your grasp and repeat the exercise with the opposite side. Repeat __________ times. Complete this exercise __________ times per day.  STRETCH -  Hamstrings, Standing  Stand or sit and extend your right / left leg, placing your foot on a chair or foot stool  Keeping a slight arch in your low back and your hips straight forward.  Lead with your chest and lean forward at the waist until you feel a gentle stretch in the back of your right / left knee or thigh. (When done correctly, this exercise requires leaning only a small distance.)  Hold this position for __________  seconds. Repeat __________ times. Complete this stretch __________ times per day. STRENGTHENING - Deep Abdominals, Pelvic Tilt   Lie on a firm bed or floor. Keeping your legs in front of you, bend your knees so they are both pointed toward the ceiling and your feet are flat on the floor.  Tense your lower abdominal muscles to press your low back into the floor. This motion will rotate your pelvis so that your tail bone is scooping upwards rather than pointing at your feet or into the floor.  With a gentle tension and even breathing, hold this position for __________ seconds. Repeat __________ times. Complete this exercise __________ times per day.  STRENGTHENING - Abdominals, Crunches   Lie on a firm bed or floor. Keeping your legs in front of you, bend your knees so they are both pointed toward the ceiling and your feet are flat on the floor. Cross your arms over your chest.  Slightly tip your chin down without bending your neck.  Tense your abdominals and slowly lift your trunk high enough to just clear your shoulder blades. Lifting higher can put excessive stress on the low back and does not further strengthen your abdominal muscles.  Control your return to the starting position. Repeat __________ times. Complete this exercise __________ times per day.  STRENGTHENING - Quadruped, Opposite UE/LE Lift   Assume a hands and knees position on a firm surface. Keep your hands under your shoulders and your knees under your hips. You may place padding under your knees for comfort.  Find your neutral spine and gently tense your abdominal muscles so that you can maintain this position. Your shoulders and hips should form a rectangle that is parallel with the floor and is not twisted.  Keeping your trunk steady, lift your right hand no higher than your shoulder and then your left leg no higher than your hip. Make sure you are not holding your breath. Hold this position __________  seconds.  Continuing to keep your abdominal muscles tense and your back steady, slowly return to your starting position. Repeat with the opposite arm and leg. Repeat __________ times. Complete this exercise __________ times per day. Document Released: 05/13/2005 Document Revised: 07/18/2011 Document Reviewed: 08/07/2008 [facility] Patient Information 2015 [place], [place]. This information is not intended to replace advice given to you by your health care provider. Make sure you discuss any questions you have with your health care provider.

## 2016-02-14 ENCOUNTER — Emergency Department (HOSPITAL_COMMUNITY): Payer: Medicaid Other

## 2016-02-14 ENCOUNTER — Emergency Department (HOSPITAL_COMMUNITY)
Admission: EM | Admit: 2016-02-14 | Discharge: 2016-02-14 | Disposition: A | Discharge status: Home / Self Care | Payer: Medicaid Other | Attending: Emergency Medicine | Admitting: Emergency Medicine

## 2016-02-14 ENCOUNTER — Encounter (HOSPITAL_COMMUNITY): Payer: Self-pay

## 2016-02-14 DIAGNOSIS — Z79899 Other long term (current) drug therapy: Secondary | ICD-10-CM | POA: Insufficient documentation

## 2016-02-14 DIAGNOSIS — I10 Essential (primary) hypertension: Secondary | ICD-10-CM | POA: Insufficient documentation

## 2016-02-14 DIAGNOSIS — M549 Dorsalgia, unspecified: Secondary | ICD-10-CM | POA: Diagnosis present

## 2016-02-14 DIAGNOSIS — M5432 Sciatica, left side: Secondary | ICD-10-CM | POA: Diagnosis not present

## 2016-02-14 MED ORDER — CYCLOBENZAPRINE HCL 10 MG PO TABS
5.0000 mg | ORAL_TABLET | Freq: Once | ORAL | Status: AC
  Administered 2016-02-14: 5 mg via ORAL
  Filled 2016-02-14: qty 1

## 2016-02-14 MED ORDER — CYCLOBENZAPRINE HCL 5 MG PO TABS: 5.0000 mg | ORAL_TABLET | Freq: Three times a day (TID) | ORAL | 0 refills | Status: DC | PRN

## 2016-02-14 MED ORDER — DICLOFENAC SODIUM 1 % TD GEL: 2.0000 g | Freq: Four times a day (QID) | TRANSDERMAL | 0 refills | Status: DC

## 2016-02-14 NOTE — ED Provider Notes (Signed)
[facility]-EMERGENCY DEPT Provider Note   CSN: [id number] Arrival date & time: 02/14/16  1709  By signing my name below, I, Clovis Pu, attest that this documentation has been prepared under the direction and in the presence of  Kerrie Buffalo, NP. Electronically Signed: Clovis Pu, ED Scribe. 02/14/16. 7:20 PM.   History   Chief Complaint Chief Complaint  Patient presents with  . Back Pain   Went to see pt at ~ 6:30 PM and pt was not present in room. Pt was seen at 7:12 PM.  The history is provided by the patient. No language interpreter was used.   HPI Comments:  Mark [name] is a 55 y.o. male, with a hx of schizophrenia, back problems and HTN, who presents to the Emergency Department complaining of pain to his left buttock and back x a few days. Pt notes the pain radiates down his left leg. Pt's companion reports he thinks it is a boil and notes the swelling has decreased from 1 day ago. He denies any recent injury. Pt has an elevated BP in ED. No alleviating factors noted. Pt is not followed by an orthopedist.   Past Medical History:  Diagnosis Date  . Hypertension   . Schizophrenia (HCC)     There are no active problems to display for this patient.   History reviewed. No pertinent surgical history.     Home Medications    Prior to Admission medications   Medication Sig Start Date End Date Taking? Authorizing Provider  atropine 1 % ophthalmic solution Place 2 drops under the tongue daily. For drooling    Historical Provider, MD  clonazePAM (KLONOPIN) 1 MG tablet Take 1 mg by mouth at bedtime.      Historical Provider, MD  cloZAPine (CLOZARIL) 100 MG tablet Take 300 mg by mouth 2 (two) times daily.      Historical Provider, MD  cyclobenzaprine (FLEXERIL) 5 MG tablet Take 1 tablet (5 mg total) by mouth 3 (three) times daily as needed for muscle spasms. 02/14/16   Hope Orlene Och, NP  diclofenac sodium (VOLTAREN) 1 % GEL Apply 2 g topically 4 (four) times daily. 02/14/16   Hope  Orlene Och, NP  divalproex (DEPAKOTE ER) 250 MG 24 hr tablet Take 500 mg by mouth at bedtime.    Historical Provider, MD  divalproex (DEPAKOTE ER) 500 MG 24 hr tablet Take 2,000 mg by mouth at bedtime.      Historical Provider, MD  HYDROcodone-acetaminophen (NORCO/VICODIN) 5-325 MG per tablet Take 1 tablet by mouth every 6 (six) hours as needed for moderate pain or severe pain. 03/12/14   Antony Madura, PA-C  ibuprofen (ADVIL,MOTRIN) 800 MG tablet Take 1 tablet (800 mg total) by mouth every 8 (eight) hours as needed (for pain.). 03/18/14   Rodolph Bong, MD  omeprazole (PRILOSEC) 20 MG capsule Take 20 mg by mouth daily.    Historical Provider, MD  propranolol (INDERAL) 20 MG tablet Take 10 mg by mouth every morning.     Historical Provider, MD    Family History No family history on file.  Social History Social History  Substance Use Topics  . Smoking status: Never Smoker  . Smokeless tobacco: Never Used  . Alcohol use No     Allergies   Review of patient's allergies indicates no known allergies.   Review of Systems Review of Systems  Constitutional: Negative for activity change.  HENT: Negative.   Musculoskeletal: Positive for back pain.  Skin:  Cystic area buttock     Physical Exam Updated Vital Signs BP (!) 160/110   Pulse 90   Temp 97.9 F (36.6 C)   Resp 16   SpO2 99%   Physical Exam  Constitutional: He is oriented to person, place, and time. He appears well-developed and well-nourished. No distress.  HENT:  Head: Normocephalic and atraumatic.  Eyes: Conjunctivae and EOM are normal. Pupils are equal, round, and reactive to light.  Neck: Normal range of motion. Neck supple.  Cardiovascular: Normal rate and regular rhythm.   Pulmonary/Chest: Effort normal. No respiratory distress. He has no wheezes. He has no rales.  Abdominal: Soft. Bowel sounds are normal. He exhibits no distension. There is no tenderness.  Musculoskeletal: Normal range of motion. He exhibits  tenderness. He exhibits no edema.       Lumbar back: He exhibits tenderness. He exhibits normal range of motion, no deformity, no spasm and normal pulse.   Pain to left buttock upon ambulation. TTP over L sciatic nerve.   Neurological: He is alert and oriented to person, place, and time. He has normal strength. No cranial nerve deficit or sensory deficit. Coordination and gait normal.  Reflex Scores:      Bicep reflexes are 2+ on the right side and 2+ on the left side.      Brachioradialis reflexes are 2+ on the right side and 2+ on the left side.      Patellar reflexes are 2+ on the right side and 2+ on the left side.      Achilles reflexes are 2+ on the right side and 2+ on the left side. Skin: Skin is warm and dry.  1 cm cystic area to the L buttocks. Radial and pedal pulses 2+.  Nursing note and vitals reviewed.    ED Treatments / Results  DIAGNOSTIC STUDIES:  Oxygen Saturation is 97% on RA, normal by my interpretation.    COORDINATION OF CARE:  7:16 PM Discussed monitoring BP and advised to follow up with PCP in 3 days. Discussed treatment plan with pt at bedside and pt agreed to plan.  Labs (all labs ordered are listed, but only abnormal results are displayed) Labs Reviewed - No data to display  Radiology Dg Lumbar Spine Complete  Result Date: 02/14/2016 CLINICAL DATA:  Acute onset of left leg pain.  Initial encounter. EXAM: LUMBAR SPINE - COMPLETE 4+ VIEW COMPARISON:  None. FINDINGS: There is no evidence of fracture or subluxation. Vertebral bodies demonstrate normal height and alignment. Intervertebral disc spaces are preserved. The visualized neural foramina are grossly unremarkable in appearance. The visualized bowel gas pattern is unremarkable in appearance; air and stool are noted within the colon. The sacroiliac joints are within normal limits. IMPRESSION: No evidence of fracture or subluxation along the lumbar spine. Electronically Signed   By: Roanna Raider M.D.   On:  02/14/2016 20:21    Procedures Needle aspiration to area on the left buttock, no fluid aspirated.   Procedures (including critical care time)  Medications Ordered in ED Medications  cyclobenzaprine (FLEXERIL) tablet 5 mg (5 mg Oral Given 02/14/16 2036)     Initial Impression / Assessment and Plan / ED Course  I have reviewed the triage vital signs and the nursing notes.  Clinical Course   Patient with back pain.  No neurological deficits and normal neuro exam.  Patient is ambulatory.  No loss of bowel or bladder control.  No concern for cauda equina.  No fever, night sweats, weight  loss, h/o cancer, IVDA, no recent procedure to back. No urinary symptoms suggestive of UTI.  Supportive care and return precaution discussed. Appears safe for discharge at this time. Follow up as indicated in discharge paperwork.    Final Clinical Impressions(s) / ED Diagnoses   Final diagnoses:  Sciatica of left side    New Prescriptions Discharge Medication List as of 02/14/2016  8:31 PM     I personally performed the services described in this documentation, which was scribed in my presence. The recorded information has been reviewed and is accurate.     [id number]Hope AnchorM Neese, NP 02/16/16 0122    Alvira MondayErin Schlossman, MD 02/21/16 [phone]

## 2016-02-14 NOTE — ED Triage Notes (Signed)
Patient complains of left buttock pain radiating down left leg x 2-3 days. Hx of back problems. Denies trauma. Pain worse with any change in position

## 2016-04-06 ENCOUNTER — Ambulatory Visit: Payer: Medicaid Other | Admitting: Physical Therapy

## 2016-04-07 ENCOUNTER — Encounter: Payer: Self-pay | Admitting: Physical Therapy

## 2016-04-07 ENCOUNTER — Ambulatory Visit: Payer: Medicaid Other | Attending: Internal Medicine | Admitting: Physical Therapy

## 2016-04-07 DIAGNOSIS — M79605 Pain in left leg: Secondary | ICD-10-CM | POA: Insufficient documentation

## 2016-04-07 NOTE — Therapy (Signed)
[facility] [address] [place], [place], [address] Phone: [phone]   Fax:  [phone]  Physical Therapy Evaluation  Patient Details  Name: Mark CorpusRonald E Mccaffrey MRN: [id number] Date of Birth: 04/01/1961 Referring Provider: Fleet ContrasEdwin Avbuere, MD  Encounter Date: 04/07/2016      PT End of Session - 04/07/16 1607    Visit Number 1   Authorization Type medicaid one-time evaluation   PT Start Time 1502   PT Stop Time 1540   PT Time Calculation (min) 38 min   Activity Tolerance Patient tolerated treatment well   Behavior During Therapy [facility] for tasks assessed/performed      Past Medical History:  Diagnosis Date  . Hypertension   . Schizophrenia (HCC)     History reviewed. No pertinent surgical history.  There were no vitals filed for this visit.       Subjective Assessment - 04/07/16 1506    Subjective Began having pain in L buttock and traveled down to L calf and under foot. Began about 2 weeks ago. Pain seems to be subsiding, no longer walking with AD.    Currently in Pain? Yes   Pain Score 0-No pain  pt reports burning and pain but reports 0/10   Pain Location Buttocks   Pain Orientation Left   Pain Descriptors / Indicators Burning   Pain Radiating Towards L calf and plantar aspect of foot   Aggravating Factors  sit<>stand too many times   Pain Relieving Factors stretch HS            [facility] PT Assessment - 04/07/16 0001      Assessment   Medical Diagnosis L sciatica   Referring Provider Fleet ContrasEdwin Avbuere, MD   Hand Dominance Left   Prior Therapy no     Precautions   Precautions None     Restrictions   Weight Bearing Restrictions No     Balance Screen   Has the patient fallen in the past 6 months No     Home Environment   Living Environment Private residence   Living Arrangements Parent   Additional Comments stairs at home     Sensation   Additional Comments impaired in L calf/foot     ROM /  Strength   AROM / PROM / Strength PROM;Strength                           PT Education - 04/07/16 1607    Education provided Yes   Education Details anatomy of condition, POC, HEP, exercise form/rationale, importance of performing HEP regularly   Person(s) Educated Patient;Parent(s)   Methods Explanation;Demonstration;Tactile cues;Verbal cues;Handout   Comprehension Verbalized understanding;Returned demonstration;Verbal cues required;Tactile cues required                    Plan - 04/07/16 1608    Clinical Impression Statement Pt presents to PT with complaints of pain that began in his L buttock and traveled to plantar surface of L foot. Pt noted to be very mobile and strong but lacks flexibility, particularly in L hip. Pt tolerated exercises well and verbalized resolution of pain following treatment. Pt was provided with HEP focused on stretching and we discussed exercises to perform at the gym independently. I believe that, if the patient performs his HEP, he will be able to resolve this issue independently.    PT Treatment/Interventions ADLs/Self Care Home Management;Therapeutic exercise;Patient/family education   Consulted and Agree with  Plan of Care Patient;Family member/caregiver   Family Member Consulted dad      Patient will benefit from skilled therapeutic intervention in order to improve the following deficits and impairments:  Pain, Impaired flexibility  Visit Diagnosis: Pain in left leg - Plan: PT plan of care cert/re-cert     Problem List There are no active problems to display for this patient.   Veena Sturgess C. Kayl Stogdill PT, DPT 04/07/16 4:19 PM   [facility] [address] [place], [place], [address] Phone: [phone]   Fax:  [phone]  Name: Mark [name] MRN: [id number] Date of Birth: 08/29/1960

## 2017-06-05 ENCOUNTER — Encounter (HOSPITAL_COMMUNITY): Payer: Self-pay

## 2017-06-05 ENCOUNTER — Emergency Department (HOSPITAL_COMMUNITY)
Admission: EM | Admit: 2017-06-05 | Discharge: 2017-06-05 | Disposition: A | Discharge status: Home / Self Care | Payer: Medicaid Other | Attending: Emergency Medicine | Admitting: Emergency Medicine

## 2017-06-05 ENCOUNTER — Emergency Department (HOSPITAL_COMMUNITY): Payer: Medicaid Other

## 2017-06-05 DIAGNOSIS — Z79899 Other long term (current) drug therapy: Secondary | ICD-10-CM | POA: Insufficient documentation

## 2017-06-05 DIAGNOSIS — R05 Cough: Secondary | ICD-10-CM | POA: Insufficient documentation

## 2017-06-05 DIAGNOSIS — I10 Essential (primary) hypertension: Secondary | ICD-10-CM | POA: Diagnosis not present

## 2017-06-05 DIAGNOSIS — R059 Cough, unspecified: Secondary | ICD-10-CM

## 2017-06-05 DIAGNOSIS — J449 Chronic obstructive pulmonary disease, unspecified: Secondary | ICD-10-CM | POA: Diagnosis not present

## 2017-06-05 HISTORY — DX: Chronic obstructive pulmonary disease, unspecified: J44.9

## 2017-06-05 MED ORDER — ALBUTEROL SULFATE HFA 108 (90 BASE) MCG/ACT IN AERS: 1.0000 | INHALATION_SPRAY | Freq: Four times a day (QID) | RESPIRATORY_TRACT | 0 refills | Status: DC | PRN

## 2017-06-05 MED ORDER — ALBUTEROL SULFATE HFA 108 (90 BASE) MCG/ACT IN AERS
2.0000 | INHALATION_SPRAY | Freq: Once | RESPIRATORY_TRACT | Status: AC
  Administered 2017-06-05: 2 via RESPIRATORY_TRACT
  Filled 2017-06-05: qty 6.7

## 2017-06-05 MED ORDER — IPRATROPIUM-ALBUTEROL 0.5-2.5 (3) MG/3ML IN SOLN
3.0000 mL | Freq: Once | RESPIRATORY_TRACT | Status: AC
  Administered 2017-06-05: 3 mL via RESPIRATORY_TRACT
  Filled 2017-06-05: qty 3

## 2017-06-05 MED ORDER — AZITHROMYCIN 250 MG PO TABS: ORAL_TABLET | ORAL | 0 refills | Status: DC

## 2017-06-05 NOTE — ED Notes (Signed)
Dr. Rodena MedinMessick aware of VS.

## 2017-06-05 NOTE — ED Provider Notes (Signed)
[facility] EMERGENCY DEPARTMENT Provider Note   CSN: [id number] Arrival date & time: 06/05/17  8657     History   Chief Complaint No chief complaint on file.   HPI Mark [name] is a 57 y.o. male.  57 year old male with prior history of COPD, hypertension, and schizophrenia presents with complaint of cough and upper respiratory congestion.  Patient reports that over the last 2-3 days he has noticed increased cough with mild production.  He denies fever.  He denies other complaint.  He denies needing to use regular bronchodilators such as albuterol.  He is currently taking prednisone -30 mg daily.  He denies recent antibiotic use.  He denies chest pain, abdominal pain, or other acute complaint.   The history is provided by the patient.  Cough  This is a new problem. The current episode started more than 2 days ago. The problem occurs every few hours. The problem has not changed since onset.The cough is non-productive. There has been no fever. The fever has been present for 3 to 4 days. Pertinent negatives include no chest pain, no chills, no shortness of breath and no wheezing. He has tried nothing for the symptoms. He is a smoker. His past medical history is significant for COPD.    Past Medical History:  Diagnosis Date  . COPD (chronic obstructive pulmonary disease) (HCC)   . Hypertension   . Schizophrenia (HCC)     There are no active problems to display for this patient.   History reviewed. No pertinent surgical history.     Home Medications    Prior to Admission medications   Medication Sig Start Date End Date Taking? Authorizing Provider  atropine 1 % ophthalmic solution Place 2 drops under the tongue daily. For drooling   Yes [provider]  clonazePAM (KLONOPIN) 1 MG tablet Take 1 mg by mouth at bedtime.     Yes [provider]  cloZAPine (CLOZARIL) 100 MG tablet Take 300 mg by mouth 2 (two) times daily.     Yes [provider]  cyclobenzaprine (FLEXERIL) 5 MG tablet Take 1 tablet (5 mg total) by mouth 3 (three) times daily as needed for muscle spasms. 02/14/16  Yes Neese, Hope M, NP  divalproex (DEPAKOTE ER) 250 MG 24 hr tablet Take 500 mg by mouth at bedtime.   Yes [provider]  gabapentin (NEURONTIN) 100 MG capsule Take 100 mg by mouth 2 (two) times daily.   Yes [provider]  ibuprofen (ADVIL,MOTRIN) 800 MG tablet Take 1 tablet (800 mg total) by mouth every 8 (eight) hours as needed (for pain.). 03/18/14  Yes Rodolph Bong, MD  omeprazole (PRILOSEC) 20 MG capsule Take 20 mg by mouth daily.   Yes [provider]  predniSONE (DELTASONE) 10 MG tablet Take 30 mg by mouth daily. 05/19/17  Yes [provider]  propranolol (INDERAL) 10 MG tablet Take 10 mg by mouth every morning.    Yes [provider]  diclofenac sodium (VOLTAREN) 1 % GEL Apply 2 g topically 4 (four) times daily. Patient not taking: Reported on 06/05/2017 02/14/16   Janne Napoleon, NP  divalproex (DEPAKOTE ER) 500 MG 24 hr tablet Take 2,000 mg by mouth at bedtime.      [provider]    Family History No family history on file.  Social History Social History   Tobacco Use  . Smoking status: Never Smoker  . Smokeless tobacco: Never Used  Substance Use  Topics  . Alcohol use: No  . Drug use: Not on file     Allergies   Patient has no known allergies.   Review of Systems Review of Systems  Constitutional: Negative for chills.  Respiratory: Positive for cough. Negative for shortness of breath and wheezing.   Cardiovascular: Negative for chest pain.  All other systems reviewed and are negative.    Physical Exam Updated Vital Signs BP 109/70 (BP Location: Right Arm)   Pulse 86   Temp 98.6 F (37 C) (Oral)   Resp 20   SpO2 96%   Physical Exam  Constitutional: He is oriented to person, place, and time. He appears well-developed and well-nourished. No distress.    HENT:  Head: Normocephalic and atraumatic.  Mouth/Throat: Oropharynx is clear and moist.  Eyes: Conjunctivae and EOM are normal. Pupils are equal, round, and reactive to light.  Neck: Normal range of motion. Neck supple.  Cardiovascular: Normal rate, regular rhythm and normal heart sounds.  Pulmonary/Chest: Effort normal and breath sounds normal. No respiratory distress.  Abdominal: Soft. He exhibits no distension. There is no tenderness.  Musculoskeletal: Normal range of motion. He exhibits no edema or deformity.  Neurological: He is alert and oriented to person, place, and time.  Skin: Skin is warm and dry.  Psychiatric: He has a normal mood and affect.  Nursing note and vitals reviewed.    ED Treatments / Results  Labs (all labs ordered are listed, but only abnormal results are displayed) Labs Reviewed - No data to display  EKG  EKG Interpretation  Date/Time:  Monday June 05 2017 08:21:18 EST Ventricular Rate:  110 PR Interval:  144 QRS Duration: 70 QT Interval:  324 QTC Calculation: 438 R Axis:   90 Text Interpretation:  Sinus tachycardia with Premature atrial complexes Rightward axis Cannot rule out Anterior infarct , age undetermined T wave abnormality, consider inferolateral ischemia Abnormal ECG Confirmed by Kristine RoyalMessick, Quenna Doepke [phone]([id number]) on 06/05/2017 9:08:27 AM       Radiology Dg Chest 2 View  Result Date: 06/05/2017 CLINICAL DATA:  Cough. EXAM: CHEST  2 VIEW COMPARISON:  08/29/2011 FINDINGS: The heart size appears normal. There is no pleural effusion identified. Chronic appearing interstitial coarsening noted bilaterally. Bilateral and lower lung zone peribronchial opacities are identified. No pleural effusion or edema. IMPRESSION: 1. Chronic interstitial coarsening with superimposed lower lobe predominant peribronchial opacities which may reflect superimposed bronchopneumonia. Followup PA and lateral chest X-ray is recommended in 3-4 weeks following trial of antibiotic  therapy to ensure resolution and exclude underlying malignancy. Electronically Signed   By: Signa Kellaylor  Stroud M.D.   On: 06/05/2017 08:50    Procedures Procedures (including critical care time)  Medications Ordered in ED Medications  ipratropium-albuterol (DUONEB) 0.5-2.5 (3) MG/3ML nebulizer solution 3 mL (3 mLs Nebulization Given 06/05/17 1000)     Initial Impression / Assessment and Plan / ED Course  I have reviewed the triage vital signs and the nursing notes.  Pertinent labs & imaging results that were available during my care of the patient were reviewed by me and considered in my medical decision making (see chart for details).     MDM screen complete  Patient is presenting with mild cough.  Following breathing treatment he feels improved.  Screening exam and chest x-ray do not suggest more acute pathology.  However, given patient's long-standing history of COPD will treat with a short course of antibiotics to cover the possibility of an early bacterial infection.  Patient is already on  prednisone and will continue that at his current dose.  Albuterol MDI given to patient as well given that he does not routinely use bronchodilators.  I suspect that his probable pathology secondary to a viral infection with concurrent mild COPD exacerbation.  Strict return precautions given.  Close follow-up is advised.  Final Clinical Impressions(s) / ED Diagnoses   Final diagnoses:  Cough    ED Discharge Orders        Ordered    azithromycin (ZITHROMAX) 250 MG tablet     06/05/17 1054    albuterol (PROVENTIL HFA;VENTOLIN HFA) 108 (90 Base) MCG/ACT inhaler  Every 6 hours PRN     06/05/17 1054       Wynetta Fines, MD 06/05/17 1057

## 2017-06-05 NOTE — ED Notes (Signed)
Pt stes he understands instructions. Home stabled with mother.with steady gait.

## 2017-06-05 NOTE — ED Triage Notes (Signed)
Patient complains of cough and increased congestion for several days. Care giver reports that its all related to COPD. Patient alert and oriented,NAD

## 2017-06-21 DIAGNOSIS — Z79899 Other long term (current) drug therapy: Secondary | ICD-10-CM | POA: Insufficient documentation

## 2017-06-21 DIAGNOSIS — K682 Retroperitoneal fibrosis: Secondary | ICD-10-CM | POA: Insufficient documentation

## 2017-06-21 DIAGNOSIS — N135 Crossing vessel and stricture of ureter without hydronephrosis: Secondary | ICD-10-CM | POA: Insufficient documentation

## 2017-07-26 ENCOUNTER — Encounter (HOSPITAL_COMMUNITY): Payer: Self-pay

## 2017-07-26 ENCOUNTER — Other Ambulatory Visit: Payer: Self-pay

## 2017-07-26 ENCOUNTER — Emergency Department (HOSPITAL_COMMUNITY)
Admission: EM | Admit: 2017-07-26 | Discharge: 2017-07-26 | Disposition: A | Discharge status: Home / Self Care | Payer: Medicaid Other | Attending: Emergency Medicine | Admitting: Emergency Medicine

## 2017-07-26 DIAGNOSIS — I10 Essential (primary) hypertension: Secondary | ICD-10-CM | POA: Diagnosis not present

## 2017-07-26 DIAGNOSIS — Z79899 Other long term (current) drug therapy: Secondary | ICD-10-CM | POA: Diagnosis not present

## 2017-07-26 DIAGNOSIS — J449 Chronic obstructive pulmonary disease, unspecified: Secondary | ICD-10-CM | POA: Insufficient documentation

## 2017-07-26 DIAGNOSIS — R040 Epistaxis: Secondary | ICD-10-CM | POA: Diagnosis present

## 2017-07-26 DIAGNOSIS — Z87898 Personal history of other specified conditions: Secondary | ICD-10-CM

## 2017-07-26 MED ORDER — OXYMETAZOLINE HCL 0.05 % NA SOLN: 1.0000 | Freq: Two times a day (BID) | NASAL | 0 refills | Status: DC | PRN

## 2017-07-26 MED ORDER — SILVER NITRATE-POT NITRATE 75-25 % EX MISC
1.0000 | Freq: Once | CUTANEOUS | Status: AC
  Administered 2017-07-26: 1 via TOPICAL
  Filled 2017-07-26: qty 1

## 2017-07-26 NOTE — ED Triage Notes (Signed)
Patient complains of recurrent intermittent nosebleeds since last night, no bleeding on arrival, NAd

## 2017-07-26 NOTE — ED Provider Notes (Signed)
[facility] EMERGENCY DEPARTMENT Provider Note   CSN: [id number] Arrival date & time: 07/26/17  0802     History   Chief Complaint No chief complaint on file.   HPI Mark [name] is a 57 y.o. male with past medical history of hypertension, COPD, who presents to ED for evaluation of intermittent nosebleeds since last night.  Admits to picking his nose last night and then experiencing bleeding from the right nare.  Bleeding stopped with supportive measures including cold rag on the forehead and leaning back.  It has bled intermittently twice since then.  Patient does report some nasal congestion.  Denies any blood thinner use, prior history of similar symptoms, lightheadedness, loss of consciousness, head injuries, trouble breathing.  HPI  Past Medical History:  Diagnosis Date  . COPD (chronic obstructive pulmonary disease) (HCC)   . Hypertension   . Schizophrenia (HCC)     There are no active problems to display for this patient.   History reviewed. No pertinent surgical history.     Home Medications    Prior to Admission medications   Medication Sig Start Date End Date Taking? Authorizing Provider  albuterol (PROVENTIL HFA;VENTOLIN HFA) 108 (90 Base) MCG/ACT inhaler Inhale 1-2 puffs into the lungs every 6 (six) hours as needed for wheezing or shortness of breath. 06/05/17   Wynetta Fines, MD  atropine 1 % ophthalmic solution Place 2 drops under the tongue daily. For drooling    [provider]  azithromycin (ZITHROMAX) 250 MG tablet Z-pack - take 500mg  on day 1, thereafter take 250mg  daily for 4 additional days 06/05/17   Wynetta Fines, MD  clonazePAM (KLONOPIN) 1 MG tablet Take 1 mg by mouth at bedtime.      [provider]  cloZAPine (CLOZARIL) 100 MG tablet Take 300 mg by mouth 2 (two) times daily.      [provider]  cyclobenzaprine (FLEXERIL) 5 MG tablet Take 1 tablet (5 mg total) by mouth 3 (three) times daily as  needed for muscle spasms. 02/14/16   Janne Napoleon, NP  diclofenac sodium (VOLTAREN) 1 % GEL Apply 2 g topically 4 (four) times daily. Patient not taking: Reported on 06/05/2017 02/14/16   Janne Napoleon, NP  divalproex (DEPAKOTE ER) 250 MG 24 hr tablet Take 500 mg by mouth at bedtime.    [provider]  divalproex (DEPAKOTE ER) 500 MG 24 hr tablet Take 2,000 mg by mouth at bedtime.      [provider]  gabapentin (NEURONTIN) 100 MG capsule Take 100 mg by mouth 2 (two) times daily.    [provider]  ibuprofen (ADVIL,MOTRIN) 800 MG tablet Take 1 tablet (800 mg total) by mouth every 8 (eight) hours as needed (for pain.). 03/18/14   Rodolph Bong, MD  omeprazole (PRILOSEC) 20 MG capsule Take 20 mg by mouth daily.    [provider]  oxymetazoline (AFRIN NASAL SPRAY) 0.05 % nasal spray Place 1 spray into both nostrils 2 (two) times daily as needed for congestion (nosebleeds). 07/26/17   Dajon Lazar, PA-C  predniSONE (DELTASONE) 10 MG tablet Take 30 mg by mouth daily. 05/19/17   [provider]  propranolol (INDERAL) 10 MG tablet Take 10 mg by mouth every morning.     [provider]    Family History No family history on file.  Social History Social History   Tobacco Use  . Smoking status: Never Smoker  . Smokeless tobacco: Never Used  Substance Use Topics  . Alcohol use: No  . Drug use: Not on file     Allergies   Patient has no known allergies.   Review of Systems Review of Systems  Constitutional: Negative for chills and fever.  HENT: Positive for congestion and nosebleeds. Negative for rhinorrhea, sinus pain and sore throat.   Neurological: Negative for light-headedness.     Physical Exam Updated Vital Signs BP (!) 154/99 (BP Location: Right Arm)   Pulse 96   Temp 98.7 F (37.1 C) (Oral)   Resp 17   SpO2 94%   Physical Exam  Constitutional: He appears well-developed and well-nourished. No distress.  HENT:  Head:  Normocephalic and atraumatic.  Nose: Nose normal. No rhinorrhea or nasal septal hematoma. No epistaxis.  Punctuate area of superficial bleeding noted on the septum wall of the right nare.  No active bleeding or septal hematoma noted at this time.  Eyes: Conjunctivae and EOM are normal. No scleral icterus.  Neck: Normal range of motion.  Pulmonary/Chest: Effort normal. No respiratory distress.  Neurological: He is alert.  Skin: No rash noted. He is not diaphoretic.  Psychiatric: He has a normal mood and affect.  Nursing note and vitals reviewed.    ED Treatments / Results  Labs (all labs ordered are listed, but only abnormal results are displayed) Labs Reviewed - No data to display  EKG  EKG Interpretation None       Radiology No results found.  Procedures Procedures (including critical care time)  Medications Ordered in ED Medications  silver nitrate applicators applicator 1 Stick (not administered)     Initial Impression / Assessment and Plan / ED Course  I have reviewed the triage vital signs and the nursing notes.  Pertinent labs & imaging results that were available during my care of the patient were reviewed by me and considered in my medical decision making (see chart for details).     Patient presents to ED for evaluation of intermittent nosebleed since last night.  He does report digital trauma to the area as well as congestion and dryness.  He denies any blood thinner use.  No active bleeding during my evaluation but did notice area of punctuate bleeding on the right nare.  No septal hematoma noted.  This was cauterized with silver nitrate.  We will also give patient information regarding steps to take should the nosebleed return as well as prescription for Afrin for any future use.  Patient appears stable for discharge at this time.  Strict return precautions given.  Portions of this note were generated with Scientist, clinical (histocompatibility and immunogenetics)Dragon dictation software. Dictation errors may occur  despite best attempts at proofreading.   Final Clinical Impressions(s) / ED Diagnoses   Final diagnoses:  History of epistaxis    ED Discharge Orders        Ordered    oxymetazoline [facility](AFRIN NASAL SPRAY) 0.05 % nasal spray  2 times daily PRN     07/26/17 0942       Dietrich PatesKhatri, Kelechi Orgeron, PA-C 07/26/17 [id number]    Jacalyn LefevreHaviland, Julie, MD 07/26/17 1045

## 2017-07-26 NOTE — ED Notes (Signed)
ED Provider at bedside. 

## 2017-09-15 ENCOUNTER — Encounter (HOSPITAL_COMMUNITY): Payer: Self-pay | Admitting: Emergency Medicine

## 2017-09-15 ENCOUNTER — Other Ambulatory Visit: Payer: Self-pay

## 2017-09-15 ENCOUNTER — Inpatient Hospital Stay (HOSPITAL_COMMUNITY): Payer: Medicaid Other

## 2017-09-15 ENCOUNTER — Emergency Department (HOSPITAL_COMMUNITY)
Admit: 2017-09-15 | Discharge: 2017-09-15 | Disposition: A | Discharge status: Home / Self Care | Payer: Medicaid Other | Attending: Emergency Medicine | Admitting: Emergency Medicine

## 2017-09-15 ENCOUNTER — Inpatient Hospital Stay (HOSPITAL_COMMUNITY)
Admission: EM | Admit: 2017-09-15 | Discharge: 2017-09-19 | DRG: 271 | Disposition: A | Discharge status: Home / Self Care | Payer: Medicaid Other | Attending: Internal Medicine | Admitting: Internal Medicine

## 2017-09-15 DIAGNOSIS — D72829 Elevated white blood cell count, unspecified: Secondary | ICD-10-CM

## 2017-09-15 DIAGNOSIS — N183 Chronic kidney disease, stage 3 (moderate): Secondary | ICD-10-CM | POA: Diagnosis not present

## 2017-09-15 DIAGNOSIS — I774 Celiac artery compression syndrome: Secondary | ICD-10-CM | POA: Diagnosis present

## 2017-09-15 DIAGNOSIS — I82432 Acute embolism and thrombosis of left popliteal vein: Secondary | ICD-10-CM | POA: Diagnosis not present

## 2017-09-15 DIAGNOSIS — F319 Bipolar disorder, unspecified: Secondary | ICD-10-CM | POA: Diagnosis present

## 2017-09-15 DIAGNOSIS — I10 Essential (primary) hypertension: Secondary | ICD-10-CM

## 2017-09-15 DIAGNOSIS — G629 Polyneuropathy, unspecified: Secondary | ICD-10-CM | POA: Diagnosis present

## 2017-09-15 DIAGNOSIS — I82422 Acute embolism and thrombosis of left iliac vein: Secondary | ICD-10-CM | POA: Diagnosis present

## 2017-09-15 DIAGNOSIS — N281 Cyst of kidney, acquired: Secondary | ICD-10-CM | POA: Diagnosis present

## 2017-09-15 DIAGNOSIS — N135 Crossing vessel and stricture of ureter without hydronephrosis: Secondary | ICD-10-CM | POA: Diagnosis present

## 2017-09-15 DIAGNOSIS — I739 Peripheral vascular disease, unspecified: Secondary | ICD-10-CM | POA: Diagnosis present

## 2017-09-15 DIAGNOSIS — I129 Hypertensive chronic kidney disease with stage 1 through stage 4 chronic kidney disease, or unspecified chronic kidney disease: Secondary | ICD-10-CM | POA: Diagnosis present

## 2017-09-15 DIAGNOSIS — F209 Schizophrenia, unspecified: Secondary | ICD-10-CM | POA: Diagnosis present

## 2017-09-15 DIAGNOSIS — N132 Hydronephrosis with renal and ureteral calculous obstruction: Secondary | ICD-10-CM | POA: Diagnosis present

## 2017-09-15 DIAGNOSIS — N269 Renal sclerosis, unspecified: Secondary | ICD-10-CM | POA: Diagnosis present

## 2017-09-15 DIAGNOSIS — I712 Thoracic aortic aneurysm, without rupture: Secondary | ICD-10-CM | POA: Diagnosis present

## 2017-09-15 DIAGNOSIS — N182 Chronic kidney disease, stage 2 (mild): Secondary | ICD-10-CM | POA: Diagnosis present

## 2017-09-15 DIAGNOSIS — I776 Arteritis, unspecified: Secondary | ICD-10-CM | POA: Diagnosis present

## 2017-09-15 DIAGNOSIS — K219 Gastro-esophageal reflux disease without esophagitis: Secondary | ICD-10-CM | POA: Diagnosis present

## 2017-09-15 DIAGNOSIS — I80202 Phlebitis and thrombophlebitis of unspecified deep vessels of left lower extremity: Secondary | ICD-10-CM

## 2017-09-15 DIAGNOSIS — Z7952 Long term (current) use of systemic steroids: Secondary | ICD-10-CM | POA: Diagnosis not present

## 2017-09-15 DIAGNOSIS — Z720 Tobacco use: Secondary | ICD-10-CM

## 2017-09-15 DIAGNOSIS — Z79899 Other long term (current) drug therapy: Secondary | ICD-10-CM | POA: Diagnosis not present

## 2017-09-15 DIAGNOSIS — M7989 Other specified soft tissue disorders: Secondary | ICD-10-CM | POA: Diagnosis not present

## 2017-09-15 DIAGNOSIS — F17210 Nicotine dependence, cigarettes, uncomplicated: Secondary | ICD-10-CM | POA: Diagnosis present

## 2017-09-15 DIAGNOSIS — J449 Chronic obstructive pulmonary disease, unspecified: Secondary | ICD-10-CM

## 2017-09-15 DIAGNOSIS — I82409 Acute embolism and thrombosis of unspecified deep veins of unspecified lower extremity: Secondary | ICD-10-CM | POA: Diagnosis present

## 2017-09-15 DIAGNOSIS — I82502 Chronic embolism and thrombosis of unspecified deep veins of left lower extremity: Secondary | ICD-10-CM | POA: Diagnosis not present

## 2017-09-15 LAB — CBC
HCT: 46.7 % (ref 39.0–52.0)
Hemoglobin: 14.7 g/dL (ref 13.0–17.0)
MCH: 28.4 pg (ref 26.0–34.0)
MCHC: 31.5 g/dL (ref 30.0–36.0)
MCV: 90.3 fL (ref 78.0–100.0)
Platelets: 160 10*3/uL (ref 150–400)
RBC: 5.17 MIL/uL (ref 4.22–5.81)
RDW: 17.1 % — ABNORMAL HIGH (ref 11.5–15.5)
WBC: 13.2 10*3/uL — ABNORMAL HIGH (ref 4.0–10.5)

## 2017-09-15 LAB — CBC WITH DIFFERENTIAL/PLATELET
Basophils Absolute: 0 10*3/uL (ref 0.0–0.1)
Basophils Relative: 0 %
EOS ABS: 0 10*3/uL (ref 0.0–0.7)
EOS PCT: 0 %
HCT: 46.9 % (ref 39.0–52.0)
Hemoglobin: 15.3 g/dL (ref 13.0–17.0)
LYMPHS PCT: 16 %
Lymphs Abs: 2 10*3/uL (ref 0.7–4.0)
MCH: 29.3 pg (ref 26.0–34.0)
MCHC: 32.6 g/dL (ref 30.0–36.0)
MCV: 89.7 fL (ref 78.0–100.0)
MONO ABS: 0.8 10*3/uL (ref 0.1–1.0)
Monocytes Relative: 6 %
Neutro Abs: 10.3 10*3/uL — ABNORMAL HIGH (ref 1.7–7.7)
Neutrophils Relative %: 78 %
PLATELETS: 161 10*3/uL (ref 150–400)
RBC: 5.23 MIL/uL (ref 4.22–5.81)
RDW: 17.2 % — AB (ref 11.5–15.5)
WBC: 13.1 10*3/uL — ABNORMAL HIGH (ref 4.0–10.5)

## 2017-09-15 LAB — TYPE AND SCREEN
ABO/RH(D): A POS
Antibody Screen: NEGATIVE

## 2017-09-15 LAB — BASIC METABOLIC PANEL
Anion gap: 14 (ref 5–15)
BUN: 12 mg/dL (ref 6–20)
CO2: 21 mmol/L — ABNORMAL LOW (ref 22–32)
Calcium: 9.3 mg/dL (ref 8.9–10.3)
Chloride: 106 mmol/L (ref 101–111)
Creatinine, Ser: 1.34 mg/dL — ABNORMAL HIGH (ref 0.61–1.24)
GFR calc Af Amer: >60 mL/min (ref >60)
GFR calc non Af Amer: 58 mL/min — ABNORMAL LOW (ref >60)
Glucose, Bld: 135 mg/dL — ABNORMAL HIGH (ref 65–99)
Potassium: 4.2 mmol/L (ref 3.5–5.1)
Sodium: 141 mmol/L (ref 135–145)

## 2017-09-15 LAB — I-STAT TROPONIN, ED: Troponin i, poc: 0 ng/mL (ref 0.00–0.08)

## 2017-09-15 LAB — PROTIME-INR
INR: 0.96
PROTHROMBIN TIME: 12.7 s (ref 11.4–15.2)

## 2017-09-15 LAB — ABO/RH: ABO/RH(D): A POS

## 2017-09-15 MED ORDER — HEPARIN (PORCINE) IN NACL 100-0.45 UNIT/ML-% IJ SOLN
1500.0000 [IU]/h | INTRAMUSCULAR | Status: DC
  Administered 2017-09-15 – 2017-09-16 (×2): 1500 [IU]/h via INTRAVENOUS
  Filled 2017-09-15 (×4): qty 250

## 2017-09-15 MED ORDER — PREDNISONE 10 MG PO TABS
10.0000 mg | ORAL_TABLET | Freq: Every day | ORAL | Status: DC
Start: 2017-09-16 — End: 2017-09-19
  Administered 2017-09-16 – 2017-09-19 (×4): 10 mg via ORAL
  Filled 2017-09-15 (×4): qty 1

## 2017-09-15 MED ORDER — HYDROCODONE-ACETAMINOPHEN 5-325 MG PO TABS: 1.0000 | ORAL_TABLET | ORAL | Status: DC | PRN

## 2017-09-15 MED ORDER — CLOZAPINE 100 MG PO TABS
300.0000 mg | ORAL_TABLET | Freq: Two times a day (BID) | ORAL | Status: DC
  Administered 2017-09-15 – 2017-09-19 (×7): 300 mg via ORAL
  Filled 2017-09-15 (×9): qty 3

## 2017-09-15 MED ORDER — CYCLOBENZAPRINE HCL 10 MG PO TABS: 5.0000 mg | ORAL_TABLET | Freq: Three times a day (TID) | ORAL | Status: DC | PRN

## 2017-09-15 MED ORDER — ATROPINE SULFATE 1 % OP SOLN
2.0000 [drp] | Freq: Every day | OPHTHALMIC | Status: DC
  Administered 2017-09-16 – 2017-09-19 (×3): 2 [drp] via SUBLINGUAL
  Filled 2017-09-15: qty 2

## 2017-09-15 MED ORDER — ACETAMINOPHEN 325 MG PO TABS: 650.0000 mg | ORAL_TABLET | Freq: Four times a day (QID) | ORAL | Status: DC | PRN

## 2017-09-15 MED ORDER — DIVALPROEX SODIUM ER 500 MG PO TB24: 2000.0000 mg | ORAL_TABLET | Freq: Every day | ORAL | Status: DC

## 2017-09-15 MED ORDER — ACETAMINOPHEN 650 MG RE SUPP: 650.0000 mg | Freq: Four times a day (QID) | RECTAL | Status: DC | PRN

## 2017-09-15 MED ORDER — PANTOPRAZOLE SODIUM 40 MG PO TBEC
40.0000 mg | DELAYED_RELEASE_TABLET | Freq: Every day | ORAL | Status: DC
  Administered 2017-09-16 – 2017-09-19 (×4): 40 mg via ORAL
  Filled 2017-09-15 (×4): qty 1

## 2017-09-15 MED ORDER — CLONAZEPAM 1 MG PO TABS
1.0000 mg | ORAL_TABLET | Freq: Every day | ORAL | Status: DC
  Administered 2017-09-15 – 2017-09-18 (×5): 1 mg via ORAL
  Filled 2017-09-15 (×2): qty 2
  Filled 2017-09-15: qty 1
  Filled 2017-09-15: qty 2
  Filled 2017-09-15: qty 1

## 2017-09-15 MED ORDER — SODIUM CHLORIDE 0.9 % IV SOLN
INTRAVENOUS | Status: DC
  Administered 2017-09-15: 19:00:00 via INTRAVENOUS
  Administered 2017-09-16: 75 mL/h via INTRAVENOUS

## 2017-09-15 MED ORDER — ONDANSETRON HCL 4 MG/2ML IJ SOLN: 4.0000 mg | Freq: Four times a day (QID) | INTRAMUSCULAR | Status: DC | PRN

## 2017-09-15 MED ORDER — HYDRALAZINE HCL 20 MG/ML IJ SOLN
5.0000 mg | Freq: Once | INTRAMUSCULAR | Status: AC
  Administered 2017-09-15: 5 mg via INTRAVENOUS
  Filled 2017-09-15: qty 1

## 2017-09-15 MED ORDER — IOPAMIDOL (ISOVUE-370) INJECTION 76%
100.0000 mL | Freq: Once | INTRAVENOUS | Status: AC | PRN
  Administered 2017-09-15: 100 mL via INTRAVENOUS

## 2017-09-15 MED ORDER — DICLOFENAC SODIUM 1 % TD GEL
2.0000 g | Freq: Four times a day (QID) | TRANSDERMAL | Status: DC
  Administered 2017-09-15 – 2017-09-17 (×5): 2 g via TOPICAL
  Filled 2017-09-15 (×2): qty 100

## 2017-09-15 MED ORDER — PREDNISONE 20 MG PO TABS: 30.0000 mg | ORAL_TABLET | Freq: Every day | ORAL | Status: DC

## 2017-09-15 MED ORDER — GABAPENTIN 100 MG PO CAPS
100.0000 mg | ORAL_CAPSULE | Freq: Two times a day (BID) | ORAL | Status: DC
  Administered 2017-09-15 – 2017-09-19 (×8): 100 mg via ORAL
  Filled 2017-09-15 (×8): qty 1

## 2017-09-15 MED ORDER — HEPARIN BOLUS VIA INFUSION
5000.0000 [IU] | Freq: Once | INTRAVENOUS | Status: AC
  Administered 2017-09-15: 5000 [IU] via INTRAVENOUS
  Filled 2017-09-15: qty 5000

## 2017-09-15 MED ORDER — BISACODYL 10 MG RE SUPP: 10.0000 mg | Freq: Every day | RECTAL | Status: DC | PRN

## 2017-09-15 MED ORDER — DIVALPROEX SODIUM ER 500 MG PO TB24
500.0000 mg | ORAL_TABLET | Freq: Every day | ORAL | Status: DC
  Administered 2017-09-15 – 2017-09-18 (×4): 500 mg via ORAL
  Filled 2017-09-15 (×5): qty 1

## 2017-09-15 MED ORDER — PROPRANOLOL HCL 10 MG PO TABS
10.0000 mg | ORAL_TABLET | Freq: Every day | ORAL | Status: DC
  Administered 2017-09-16 – 2017-09-18 (×3): 10 mg via ORAL
  Filled 2017-09-15 (×3): qty 1

## 2017-09-15 MED ORDER — ONDANSETRON HCL 4 MG PO TABS: 4.0000 mg | ORAL_TABLET | Freq: Four times a day (QID) | ORAL | Status: DC | PRN

## 2017-09-15 MED ORDER — HEPARIN (PORCINE) IN NACL 100-0.45 UNIT/ML-% IJ SOLN: 12.0000 [IU]/kg/h | INTRAMUSCULAR | Status: DC

## 2017-09-15 MED ORDER — ALBUTEROL SULFATE (2.5 MG/3ML) 0.083% IN NEBU: 3.0000 mL | INHALATION_SOLUTION | Freq: Four times a day (QID) | RESPIRATORY_TRACT | Status: DC | PRN

## 2017-09-15 MED ORDER — IOPAMIDOL (ISOVUE-370) INJECTION 76%
INTRAVENOUS | Status: AC
  Filled 2017-09-15: qty 100

## 2017-09-15 MED ORDER — SODIUM CHLORIDE 0.9 % IV SOLN
INTRAVENOUS | Status: DC
  Administered 2017-09-15: 16:00:00 via INTRAVENOUS

## 2017-09-15 MED ORDER — SENNOSIDES-DOCUSATE SODIUM 8.6-50 MG PO TABS: 1.0000 | ORAL_TABLET | Freq: Every evening | ORAL | Status: DC | PRN

## 2017-09-15 NOTE — H&P (Addendum)
History and Physical    Mark [name]:[id number] DOB: 1961/02/13 DOA: 09/15/2017   PCP: Fleet Contras, MD   Patient coming from:  Home    Chief Complaint: Left leg swelling   HPI: Mark [name] is a 57 y.o. 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  History is obtained by his father, the patient is  unable to provide detailed history due to psychiatric issues.  His mother reports that yesterday, the patient did his family, and the father noted that he was favoring his left lower extremity when walking, and the mother noted that his leg was severely swollen, with tense edema.  The patient denies any worsening chest pain, pleuritic chest pain, nausea, vomiting, diaphoresis, hemoptysis.  He denies any headaches or vision changes.  He denies any abdominal pain, dysuria or gross hematuria.  Of note, he was told by his caregiver at the group home, that he has a "growth in his abdomen on the left ".  She has chronic neuropathy, but there are no worsening paresthesias.  He is leg is significantly larger than the right.  He denies any calf pain.  The patient is somewhat compliant with his medications, at times forgetting to take them.  He denies taking an aspirin a day.  He denies any alcohol or recreational drug use.  He does partake tobacco.  No recent long distance trips.  No recent surgeries.  No prior history of PE or DVT.  He is not on hormonal products.  No family history of clotting disorders.     ED Course:  BP (!) 142/98 (BP Location: Left Arm)   Pulse 92   Temp 98.1 F (36.7 C) (Oral)   Resp 20   Ht  (1.803 m)   Wt 91.6 kg (202 lb)   SpO2 98%   BMI 28.17 kg/m   Of note his last CT angiogram of the abdomen and pelvis in  2018 showed  Inflammatory thickening around the infrarenal abdominal and common iliac arteries. This is consistent with chronic periaortitis which is a spectrum of disease that includes idiopathic retroperitoneal fibrosis and isolated periaortitis as well as aneurysmal types. 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 Idiopathic retroperitoneal fibrosis usually involves other retroperitoneal structures as well, including the IVC and ureters. Sodium 141, potassium 4.2.  Bicarb 21.  Glucose 135.  Creatinine 1.34, with essentially normal GFR. Troponin negative Count 13.2 hemoglobin 14.7 platelets 160. Preliminary vascular studies show positive DVT for acute DVT involving the left entire left lower extremity, and the distal external iliac vein, ileum vein and IVC were not clearly visualized. PT 12.7, INR 0.96. She was placed on heparin infusion by pharmacy.    Review of Systems:  As per HPI otherwise all other systems reviewed and are negative  Past Medical History:  Diagnosis Date  . COPD (chronic obstructive pulmonary disease) (HCC)   . Hypertension   . Schizophrenia (HCC)     History reviewed. No pertinent surgical history.  Social History Social History   Socioeconomic History  . Marital status: Single    Spouse name: Not on file  . Number of children: Not on file  . Years of education: Not on file  . Highest education level: Not on  file  Occupational History  . Not on file  Social Needs  . Financial resource strain: Not on file  . Food insecurity:    Worry: Not on file    Inability: Not on file  . Transportation needs:    Medical: Not on file    Non-medical: Not on file  Tobacco Use  . Smoking status: Current Every Day Smoker    Packs/day: 0.50    Types: Cigarettes  . Smokeless tobacco: Never Used  Substance and Sexual Activity  . Alcohol use: No  . Drug use: Never  . Sexual activity: Not on file  Lifestyle  . Physical activity:     Days per week: Not on file    Minutes per session: Not on file  . Stress: Not on file  Relationships  . Social connections:    Talks on phone: Not on file    Gets together: Not on file    Attends religious service: Not on file    Active member of club or organization: Not on file    Attends meetings of clubs or organizations: Not on file    Relationship status: Not on file  . Intimate partner violence:    Fear of current or ex partner: Not on file    Emotionally abused: Not on file    Physically abused: Not on file    Forced sexual activity: Not on file  Other Topics Concern  . Not on file  Social History Narrative  . Not on file     No Known Allergies  No family history on file.    Prior to Admission medications   Medication Sig Start Date End Date Taking? Authorizing Provider  albuterol (PROVENTIL HFA;VENTOLIN HFA) 108 (90 Base) MCG/ACT inhaler Inhale 1-2 puffs into the lungs every 6 (six) hours as needed for wheezing or shortness of breath. 06/05/17   Wynetta Fines, MD  atropine 1 % ophthalmic solution Place 2 drops under the tongue daily. For drooling    [provider]  azithromycin (ZITHROMAX) 250 MG tablet Z-pack - take  on day 1, thereafter take  daily for 4 additional days 06/05/17   Wynetta Fines, MD  clonazePAM (KLONOPIN) 1 MG tablet Take 1 mg by mouth at bedtime.      [provider]  cloZAPine (CLOZARIL) 100 MG tablet Take 300 mg by mouth 2 (two) times daily.      [provider]  cyclobenzaprine (FLEXERIL) 5 MG tablet Take 1 tablet (5 mg total) by mouth 3 (three) times daily as needed for muscle spasms. 02/14/16   Janne Napoleon, NP  diclofenac sodium (VOLTAREN) 1 % GEL Apply 2 g topically 4 (four) times daily. Patient not taking: Reported on 06/05/2017 02/14/16   Janne Napoleon, NP  divalproex (DEPAKOTE ER) 250 MG 24 hr tablet Take 500 mg by mouth at bedtime.    [provider]  divalproex (DEPAKOTE ER) 500 MG 24 hr  tablet Take 2,000 mg by mouth at bedtime.      [provider]  gabapentin (NEURONTIN) 100 MG capsule Take 100 mg by mouth 2 (two) times daily.    [provider]  ibuprofen (ADVIL,MOTRIN) 800 MG tablet Take 1 tablet (800 mg total) by mouth every 8 (eight) hours as needed (for pain.). 03/18/14   Rodolph Bong, MD  omeprazole (PRILOSEC) 20 MG capsule Take 20 mg by mouth daily.    [provider]  oxymetazoline (AFRIN NASAL SPRAY) 0.05 % nasal  spray Place 1 spray into both nostrils 2 (two) times daily as needed for congestion (nosebleeds). 07/26/17   Khatri, Hina, PA-C  predniSONE (DELTASONE) 10 MG tablet Take 30 mg by mouth daily. 05/19/17   [provider]  propranolol (INDERAL) 10 MG tablet Take 10 mg by mouth every morning.     [provider]    Physical Exam:  Vitals:   09/15/17 1002 09/15/17 1003 09/15/17 1406 09/15/17 1442  BP: 117/84  (!) 142/102 (!) 142/98  Pulse: (!) 103  (!) 101 92  Resp: 18  (!) 22 20  Temp: 98 F (36.7 C)  98.1 F (36.7 C)   TempSrc: Oral  Oral   SpO2: 97%  94% 98%  Weight:  91.6 kg (202 lb)    Height:   (1.803 m)     Constitutional: NAD, calm, comfortable Eyes: PERRL, lids and conjunctivae normal.  Sclera muddy. ENMT: Mucous membranes are moist, without exudate or lesions  Neck: normal, supple, no masses, no thyromegaly Respiratory: clear to auscultation bilaterally, no wheezing, no crackles. Normal respiratory effort  Cardiovascular: Regular rate and rhythm, no murmur, rubs or gallops.  Massive tense left lower extremity extremity edema, warm, no cords are appreciated.  The area is nontender.. 2+ pedal pulses. No carotid bruits.  Abdomen: Soft, non tender, No hepatosplenomegaly. Bowel sounds positive.  Cannot appreciate any fullness or CVAT Musculoskeletal: no clubbing / cyanosis. Moves all extremities Skin: no jaundice, No lesions.  Neurologic: Sensation intact  Strength equal in all  extremities Psychiatric:   Alert and oriented x 3. Normal mood.   Severe swelling of his left lower extremity from his toes all the way through the groin.  This is woody and edematous, there are pulses at the left foot, right lower extremity with no edema     Labs on Admission: I have personally reviewed following labs and imaging studies  CBC: Recent Labs  Lab 09/15/17 1117  WBC 13.2*  HGB 14.7  HCT 46.7  MCV 90.3  PLT 160    Basic Metabolic Panel: Recent Labs  Lab 09/15/17 1117  NA 141  K 4.2  CL 106  CO2 21*  GLUCOSE 135*  BUN 12  CREATININE 1.34*  CALCIUM 9.3    GFR: Estimated Creatinine Clearance: 72.1 mL/min (A) (by C-G formula based on SCr of 1.34 mg/dL (H)).  Liver Function Tests: No results for input(s): AST, ALT, ALKPHOS, BILITOT, PROT, ALBUMIN in the last 168 hours. No results for input(s): LIPASE, AMYLASE in the last 168 hours. No results for input(s): AMMONIA in the last 168 hours.  Coagulation Profile: Recent Labs  Lab 09/15/17 1440  INR 0.96    Cardiac Enzymes: No results for input(s): CKTOTAL, CKMB, CKMBINDEX, TROPONINI in the last 168 hours.  BNP (last 3 results) No results for input(s): PROBNP in the last 8760 hours.  HbA1C: No results for input(s): HGBA1C in the last 72 hours.  CBG: No results for input(s): GLUCAP in the last 168 hours.  Lipid Profile: No results for input(s): CHOL, HDL, LDLCALC, TRIG, CHOLHDL, LDLDIRECT in the last 72 hours.  Thyroid Function Tests: No results for input(s): TSH, T4TOTAL, FREET4, T3FREE, THYROIDAB in the last 72 hours.  Anemia Panel: No results for input(s): VITAMINB12, FOLATE, FERRITIN, TIBC, IRON, RETICCTPCT in the last 72 hours.  Urine analysis:    Component Value Date/Time   COLORURINE YELLOW 08/29/2011 1059   APPEARANCEUR CLEAR 08/29/2011 1059   LABSPEC 1.016 08/29/2011 1059   PHURINE 6.0 08/29/2011 1059  GLUCOSEU NEGATIVE 08/29/2011 1059   HGBUR NEGATIVE 08/29/2011 1059    BILIRUBINUR NEGATIVE 08/29/2011 1059   KETONESUR NEGATIVE 08/29/2011 1059   PROTEINUR NEGATIVE 08/29/2011 1059   UROBILINOGEN 1.0 08/29/2011 1059   NITRITE NEGATIVE 08/29/2011 1059   LEUKOCYTESUR NEGATIVE 08/29/2011 1059    Sepsis Labs: (procalcitonin:4,lacticidven:4) )No results found for this or any previous visit (from the past 240 hour(s)).   Radiological Exams on Admission: No results found.  EKG: Independently reviewed.  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  CT angiogram of the chest and abdomen and pelvis are currently pending.  2D echo is pending.  EKG pending.  Other risk factors include tobacco abuse.  PESI  Score 85-95, Admit to SDU   2Decho to rule out heart strain  Continue heparin per pharmacy VVS to see possible thrombectomy . Will keep patient NPO Discuss non-oral anticoagulants vs coumadin versus new anticoagulant Follow-up results of EKG, CT angios of chest, abdomen and pelvis Bed rest   Leukocytosis, likely reactive due to above, steroids, COPD  ,  WBC 13 Afebrile    IVF   Repeat CBC in AM  History of renal fibrosis, the patient is on Imuran, Prednisone  followed by Rheumatology at [facility] Follow as OP with specialty   Bipolar disorder  Continue Klonopin, Clozaril, Depakote     Peripheral neuropathy, continue Neurontin  GERD, no acute symptoms Continue PPI   Hypertension BP   142/98    Pulse 92   Continue home anti-hypertensive medications   COPD without exacerbation Osats normal   WBC 13 (likely  reactive and pt on prednisone)   Continue nebs       DVT prophylaxis:  Heparin per pharmacy  Code Status:    Full Code Family Communication:  Discussed with patient and father  Disposition Plan: Expect patient to be discharged to home after condition improves Consults called:    VVS  Admission status:    Marlowe Kays, PA-C [facility]   Amion text  [phone]   09/15/2017, 4:06 PM   I have examined the patient, reviewed the chart and discussed the plan with Marlowe Kays, NP.  57 y/o schizophrenic with retroperitoneal fibrosis under the care of rheumatology and on Imuran and Prednisone who lives in a group home for 7 yrs and is and unreliable historian.  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   DVT  - Heparin infusion-  - ED has ordered CTA chest and a Vascular surgery eval to assess for TPA or thrombectomy.  - His nicotine abuse further increases his risk for thrombosis - d/c ECHO and Troponin - if CT shows extensive PE, they can be ordered.  Addendum: Dr Rondel Baton note states no call back from Vascular. I have spoken with Dr Randie Heinz who did not receive a call. He will evaluate the patient.  Retroperitoneal fibrosis Repeat CTA abd/ pelvis ordered to assess extent of fibrosis. - cont Imuran- Methotrexate and Folic acid stopped in Feb per notes due to interaction  with clozaril. NOTE: there are 2 prednisone doses on med rec (20 and 30 mg) and the rheum note (Dr Sharmon Revere) which I have reviewed in care everywhere from 4/23 states he was to continue 10 mg daily only. It was reduced from 30 mg to 20 in Feb (per note in Feb) and the plan was to decrease to 10 mg after 1 mo.   I have ordered 10 mg daily.    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 Agree with slow IVF.  Calvert Cantor, MD Pager: [email or web address]

## 2017-09-15 NOTE — ED Provider Notes (Signed)
[facility] EMERGENCY DEPARTMENT Provider Note   CSN: [id number] Arrival date & time: 09/15/17  0932     History   Chief Complaint Chief Complaint  Patient presents with  . Leg Swelling    HPI Mark [name] is a 57 y.o. male.  HPI  The patient is a 57 year old male, he has a history of COPD, high blood pressure and schizophrenia.  He presents to the hospital today in the care of his father with a complaint of a unilaterally swollen leg on the left.  Reportedly the patient had been at his parents house yesterday and was noted to be favoring his left leg when he walked.  The mother noted that he was severely swollen, thus they have come to the hospital today for further evaluation.  The patient is difficult to get history from due to his underlying schizophrenia but states that he has no chest pain shortness of breath or coughing.  He does report persistent swelling and pain of his leg which is been going on for approximately 1 week though he is not exact on the days of the details.  Level 5 caveat applies due to underlying schizophrenia and poor historian.  Past Medical History:  Diagnosis Date  . COPD (chronic obstructive pulmonary disease) (HCC)   . Hypertension   . Schizophrenia (HCC)     There are no active problems to display for this patient.   History reviewed. No pertinent surgical history.      Home Medications    Prior to Admission medications   Medication Sig Start Date End Date Taking? Authorizing Provider  albuterol (PROVENTIL HFA;VENTOLIN HFA) 108 (90 Base) MCG/ACT inhaler Inhale 1-2 puffs into the lungs every 6 (six) hours as needed for wheezing or shortness of breath. 06/05/17   Wynetta Fines, MD  atropine 1 % ophthalmic solution Place 2 drops under the tongue daily. For drooling    [provider]  azithromycin (ZITHROMAX) 250 MG tablet Z-pack - take  on day 1, thereafter take  daily for 4 additional days 06/05/17    Wynetta Fines, MD  clonazePAM (KLONOPIN) 1 MG tablet Take 1 mg by mouth at bedtime.      [provider]  cloZAPine (CLOZARIL) 100 MG tablet Take 300 mg by mouth 2 (two) times daily.      [provider]  cyclobenzaprine (FLEXERIL) 5 MG tablet Take 1 tablet (5 mg total) by mouth 3 (three) times daily as needed for muscle spasms. 02/14/16   Janne Napoleon, NP  diclofenac sodium (VOLTAREN) 1 % GEL Apply 2 g topically 4 (four) times daily. Patient not taking: Reported on 06/05/2017 02/14/16   Janne Napoleon, NP  divalproex (DEPAKOTE ER) 250 MG 24 hr tablet Take 500 mg by mouth at bedtime.    [provider]  divalproex (DEPAKOTE ER) 500 MG 24 hr tablet Take 2,000 mg by mouth at bedtime.      [provider]  gabapentin (NEURONTIN) 100 MG capsule Take 100 mg by mouth 2 (two) times daily.    [provider]  ibuprofen (ADVIL,MOTRIN) 800 MG tablet Take 1 tablet (800 mg total) by mouth every 8 (eight) hours as needed (for pain.). 03/18/14   Rodolph Bong, MD  omeprazole (PRILOSEC) 20 MG capsule Take 20 mg by mouth daily.    [provider]  oxymetazoline (AFRIN NASAL SPRAY) 0.05 % nasal spray Place 1 spray into both nostrils 2 (two) times daily as needed  for congestion (nosebleeds). 07/26/17   Khatri, Hina, PA-C  predniSONE (DELTASONE) 10 MG tablet Take 30 mg by mouth daily. 05/19/17   [provider]  propranolol (INDERAL) 10 MG tablet Take 10 mg by mouth every morning.     [provider]    Family History No family history on file.  Social History Social History   Tobacco Use  . Smoking status: Current Every Day Smoker    Packs/day: 0.50    Types: Cigarettes  . Smokeless tobacco: Never Used  Substance Use Topics  . Alcohol use: No  . Drug use: Never     Allergies   Patient has no known allergies.   Review of Systems Review of Systems  Unable to perform ROS: Psychiatric disorder     Physical Exam Updated Vital  Signs BP (!) 142/102 (BP Location: Right Arm)   Pulse (!) 101   Temp 98.1 F (36.7 C) (Oral)   Resp (!) 22   Ht  (1.803 m)   Wt 91.6 kg (202 lb)   SpO2 94%   BMI 28.17 kg/m   Physical Exam  Constitutional: He appears well-developed and well-nourished. No distress.  HENT:  Head: Normocephalic and atraumatic.  Mouth/Throat: Oropharynx is clear and moist. No oropharyngeal exudate.  Eyes: Pupils are equal, round, and reactive to light. Conjunctivae and EOM are normal. Right eye exhibits no discharge. Left eye exhibits no discharge. No scleral icterus.  Neck: Normal range of motion. Neck supple. No JVD present. No thyromegaly present.  Cardiovascular: Regular rhythm, normal heart sounds and intact distal pulses. Exam reveals no gallop and no friction rub.  No murmur heard. Borderline tachycardia  Pulmonary/Chest: Effort normal and breath sounds normal. No respiratory distress. He has no wheezes. He has no rales.  Abdominal: Soft. Bowel sounds are normal. He exhibits no distension and no mass. There is no tenderness.  Musculoskeletal: Normal range of motion. He exhibits edema. He exhibits no tenderness.  Severe swelling of his left lower extremity from his toes all the way through the groin.  This is woody and edematous, there are pulses at the left foot, right lower extremity with no edema  Lymphadenopathy:    He has no cervical adenopathy.  Neurological: He is alert. Coordination normal.  Skin: Skin is warm and dry. No rash noted. No erythema.  Psychiatric: He has a normal mood and affect. His behavior is normal.  Nursing note and vitals reviewed.    ED Treatments / Results  Labs (all labs ordered are listed, but only abnormal results are displayed) Labs Reviewed  BASIC METABOLIC PANEL - Abnormal; Notable for the following components:      Result Value   CO2 21 (*)    Glucose, Bld 135 (*)    Creatinine, Ser 1.34 (*)    GFR calc non Af Amer 58 (*)    All other components  within normal limits  CBC - Abnormal; Notable for the following components:   WBC 13.2 (*)    RDW 17.1 (*)    All other components within normal limits  PROTIME-INR  I-STAT TROPONIN, ED  TYPE AND SCREEN    EKG None  Radiology No results found.  Procedures .Critical Care Performed by: Eber Hong, MD Authorized by: Eber Hong, MD   Critical care provider statement:    Critical care time (minutes):  35   Critical care time was exclusive of:  Separately billable procedures and treating other patients and teaching time   Critical care  was necessary to treat or prevent imminent or life-threatening deterioration of the following conditions: Phlegmasia / large DVT.   Critical care was time spent personally by me on the following activities:  Blood draw for specimens, development of treatment plan with patient or surrogate, discussions with consultants, evaluation of patient's response to treatment, examination of patient, obtaining history from patient or surrogate, ordering and performing treatments and interventions, ordering and review of laboratory studies, ordering and review of radiographic studies, pulse oximetry, re-evaluation of patient's condition and review of old charts   (including critical care time)  Medications Ordered in ED Medications  heparin ADULT infusion 100 units/mL (25000 units/287mL sodium chloride 0.45%) (has no administration in time range)  0.9 %  sodium chloride infusion (has no administration in time range)     Initial Impression / Assessment and Plan / ED Course  I have reviewed the triage vital signs and the nursing notes.  Pertinent labs & imaging results that were available during my care of the patient were reviewed by me and considered in my medical decision making (see chart for details).  Clinical Course as of Sep 16 1427  Fri Sep 15, 2017  1357 Positive DVT- entire left leg, shortness of breath per [place].    [EH]  1400 Attempted to call  charge x2, no answer, informed triage RN needs to be next back.    [EH]    Clinical Course User Index [EH] Cristina Gong, PA-C   The patient has what appears to be phlegmasia.  He has near complete occlusion of his left lower extremity venous drainage system.  He does have pulses, I will consult with vascular, start heparin drip and send the patient for a CT angiogram to evaluate for pulmonary embolism.  I have paged vascular, discussed the case with the hospitalist who has been kind enough to admit the patient to the hospital.  Heparin drip has been started  CT angiogram pending  Vascular has not called back at change of shift -   Appreciate the hospitalist for admitting the patient to the hospital and continuing the Heparin drip.  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

## 2017-09-15 NOTE — Progress Notes (Signed)
Preliminary results by tech - Left Lower Ext. Venous Duplex Completed. Positive for acute deep vein thrombosis involving the entire left leg and the distal external iliac vein. Common iliac vein and IVC were not clearly visualized. Results given to Lyndel Safe, PA. Marilynne Halsted, BS, RDMS, RVT

## 2017-09-15 NOTE — ED Triage Notes (Signed)
Patient to ED c/o L leg swelling, from L upper leg to L ankle x 2 days. Patient denies injury, reports increased with pain with ambulation and movement, no calf tenderness or redness noted. Patient also reports numbness to bottom of L foot that is not new. Denies long road or plane trips. Denies SOB or CP.

## 2017-09-15 NOTE — Progress Notes (Signed)
ANTICOAGULATION CONSULT NOTE - Initial Consult  Pharmacy Consult for Heparin Indication: DVT  No Known Allergies  Patient Measurements: Height:  (180.3 cm) Weight: 202 lb (91.6 kg) IBW/kg (Calculated) : 75.3 Heparin Dosing Weight: 91 kg   Vital Signs: Temp: 98.1 F (36.7 C) (05/10 1406) Temp Source: Oral (05/10 1406) BP: 142/98 (05/10 1442) Pulse Rate: 92 (05/10 1442)  Labs: Recent Labs    09/15/17 1117 09/15/17 1440  HGB 14.7  --   HCT 46.7  --   PLT 160  --   LABPROT  --  12.7  INR  --  0.96  CREATININE 1.34*  --     Estimated Creatinine Clearance: 72.1 mL/min (A) (by C-G formula based on SCr of 1.34 mg/dL (H)).   Medical History: Past Medical History:  Diagnosis Date  . COPD (chronic obstructive pulmonary disease) (HCC)   . Hypertension   . Schizophrenia (HCC)     Medications:   (Not in a hospital admission)  Assessment: 62 YOM with new LLE DVT to start IV heparin. H/H and Plt wnl. Patient is not on any anticoagulation prior to admission  Goal of Therapy:  Heparin level 0.3-0.7 units/ml Monitor platelets by anticoagulation protocol: Yes   Plan:  -Heparin 5000 units IV bolus, then start IV heparin infusion at 1500 units/hr -F/u 6 hr HL -Monitor daily HL, CBC and s/s of bleeding  Vinnie Level, PharmD., BCPS Clinical Pharmacist

## 2017-09-15 NOTE — ED Notes (Signed)
Patient transported to Ultrasound 

## 2017-09-15 NOTE — ED Notes (Signed)
PAGED ADMITTING PER RN  

## 2017-09-15 NOTE — Consult Note (Addendum)
Hospital Consult    Reason for Consult:  Extensive left lower extremity dvt Referring Physician:  Dr. Butler Denmark MRN #:  [id number]  History of Present Illness: This is a 57 y.o. male with h/o copd, htn and schizophrenia.  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  Patient is limited historian as his father is not present but does not know of any previous history of DVT and does not know of any blood thinners.  Cannot tell me the exact timing of his left leg  swelling does say that it feels swollen.  His foot does not give him any issues in fact he does not have any pain at this time.  He has never had issues like this before that he can remember.  Past Medical History:  Diagnosis Date  . COPD (chronic obstructive pulmonary disease) (HCC)   . Hypertension   . Schizophrenia (HCC)     History reviewed. No pertinent surgical history.  No Known Allergies  Prior to Admission medications   Medication Sig Start Date End Date Taking? Authorizing Provider  albuterol (PROVENTIL HFA;VENTOLIN HFA) 108 (90 Base) MCG/ACT inhaler Inhale 1-2 puffs into the lungs every 6 (six) hours as needed for wheezing or shortness of breath. 06/05/17  Yes Wynetta Fines, MD  azaTHIOprine (IMURAN) 50 MG tablet Take 50 mg by mouth 2 (two) times daily. 08/29/17  Yes [provider]  cloZAPine (CLOZARIL) 100 MG tablet Take 300 mg by mouth 2 (two) times daily.     Yes [provider]  divalproex (DEPAKOTE ER) 500 MG 24 hr tablet Take 500 mg by mouth at bedtime.    Yes [provider]  gabapentin (NEURONTIN) 100 MG capsule Take 100 mg by mouth 2 (two) times daily.   Yes [provider]  ibuprofen (ADVIL,MOTRIN) 800 MG tablet Take 1 tablet (800 mg total) by mouth every 8 (eight) hours as needed (for pain.). Patient taking differently: Take 800 mg by mouth 3 (three) times daily as needed (for pain).  03/18/14  Yes Rodolph Bong, MD  omeprazole (PRILOSEC) 20 MG capsule Take 20 mg by mouth daily before breakfast.    Yes [provider]  predniSONE (DELTASONE) 10 MG tablet Take 20 mg by mouth daily. 08/29/17  Yes [provider]  propranolol (INDERAL) 10 MG tablet Take 10 mg by mouth every morning.    Yes [provider]  atropine 1 % ophthalmic solution Place 2 drops under the tongue daily. For drooling    [provider]  clonazePAM (KLONOPIN) 1 MG tablet Take 1 mg by mouth at bedtime.      [provider]  cyclobenzaprine (FLEXERIL) 5 MG tablet Take 1 tablet (5 mg total) by mouth 3 (three) times daily as needed for muscle spasms. 02/14/16   Janne Napoleon, NP  diclofenac sodium (VOLTAREN) 1 % GEL Apply 2 g topically 4 (four) times daily. Patient not taking: Reported on 09/15/2017 02/14/16   Janne Napoleon, NP    Social History   Socioeconomic History  . Marital status: Single    Spouse name: Not on file  . Number of children: Not on file  . Years of education: Not on file  . Highest education level: Not on file  Occupational History  . Not on file  Social Needs  . Financial resource strain: Not on file  . Food insecurity:    Worry: Not on file  Inability: Not on file  . Transportation needs:    Medical: Not on file    Non-medical: Not on file  Tobacco Use  . Smoking status: Current Every Day Smoker    Packs/day: 0.50    Types: Cigarettes  . Smokeless tobacco: Never Used  Substance and Sexual Activity  . Alcohol use: No  . Drug use: Never  . Sexual activity: Not on file  Lifestyle  . Physical activity:    Days per week: Not on file    Minutes per session: Not on file  . Stress: Not on file  Relationships  . Social connections:    Talks on phone: Not on file    Gets together: Not on file    Attends religious service: Not on file    Active member of club or organization: Not on file    Attends meetings of clubs or organizations: Not on file     Relationship status: Not on file  . Intimate partner violence:    Fear of current or ex partner: Not on file    Emotionally abused: Not on file    Physically abused: Not on file    Forced sexual activity: Not on file  Other Topics Concern  . Not on file  Social History Narrative  . Not on file     No family history on file.  ROS:  Positive    Negative    All sytems reviewed and are negative  Cardiovascular:  chest pain/pressure  palpitations  SOB lying flat  DOE  pain in legs while walking  pain in legs at rest  pain in legs at night  non-healing ulcers  hx of DVT  swelling in legs  Pulmonary:  productive cough  asthma/wheezing  home O2  Neurologic:  weakness in  arms  legs  numbness in  arms  legs  hx of CVA  mini stroke difficulty speaking or slurred speech  temporary loss of vision in one eye  dizziness  Hematologic:  hx of cancer  bleeding problems  problems with blood clotting easily  Endocrine:    diabetes  thyroid disease  GI  vomiting blood  blood in stool  GU:  CKD/renal failure  HD--[]  M/W/F or  T/T/S  burning with urination  blood in urine  Psychiatric:  anxiety  depression  Musculoskeletal:  arthritis  joint pain  Integumentary:  rashes  ulcers  Constitutional:  fever  chills   Physical Examination  Vitals:   09/15/17 1515 09/15/17 1545  BP: 103/86 (!) 160/102  Pulse: 98 94  Resp: (!) 29 18  Temp:    SpO2: 98% 97%   Body mass index is 28.17 kg/m.  General:  WDWN in NAD HENT: WNL, normocephalic Pulmonary: normal non-labored breathing, without Rales, rhonchi,  wheezing Cardiac: palpable femoral pulses bilaterally 2+ left dp pulse Abdomen:  soft, NT/ND, no masses Extremities: Right leg is normal Left leg has significant edema that is nonpitting Both legs have similar color. Neurologic: A&O X 3; he has full range of  motion of his left foot and ankle and can feel his left foot. Psychiatric: He is appropriate on exam.  CBC    Component Value Date/Time   WBC 13.2 (H) 09/15/2017 1117   RBC 5.17 09/15/2017 1117   HGB 14.7 09/15/2017 1117   HCT 46.7 09/15/2017 1117   PLT 160 09/15/2017 1117   MCV 90.3 09/15/2017 1117   MCH 28.4 09/15/2017 1117   MCHC 31.5 09/15/2017 1117  RDW 17.1 (H) 09/15/2017 1117   LYMPHSABS 3.0 04/21/2011 1206   MONOABS 0.6 04/21/2011 1206   EOSABS 0.1 04/21/2011 1206   BASOSABS 0.0 04/21/2011 1206    BMET    Component Value Date/Time   NA 141 09/15/2017 1117   K 4.2 09/15/2017 1117   CL 106 09/15/2017 1117   CO2 21 (L) 09/15/2017 1117   GLUCOSE 135 (H) 09/15/2017 1117   BUN 12 09/15/2017 1117   CREATININE 1.34 (H) 09/15/2017 1117   CALCIUM 9.3 09/15/2017 1117   GFRNONAA 58 (L) 09/15/2017 1117   GFRAA >60 09/15/2017 1117    COAGS: Lab Results  Component Value Date   INR 0.96 09/15/2017     Non-Invasive Vascular Imaging:   IMPRESSION: No evidence of aortic dissection.  Abnormal soft tissue surrounding mid and distal abdominal aorta extending into the proximal common iliac arteries, appears fairly concentric, question aortic wall thickening such as from a large vessel vasculitis though this could also be seen with retroperitoneal fibrosis; the smooth concentric nature makes this less likely to represent thrombus.  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  Recommend annual imaging followup by CTA or MRA. This recommendation follows 2010 ACCF/AHA/AATS/ACR/ASA/SCA/SCAI/SIR/STS/SVM Guidelines for the Diagnosis and Management of Patients with Thoracic Aortic Disease. Circulation. 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  Dilated LEFT common and external iliac veins extending into LEFT common femoral vein with surrounding soft tissue  edema and associated significant edema of the proximal LEFT lower extremity highly suspicious for deep venous thrombosis; venous duplex imaging recommended.  BILATERAL renal cysts.  Mild RIGHT hydronephrosis and proximal hydroureter with 2 mm mid RIGHT ureteral calculus.  I reviewed the CT scan and the above findings.  He does have a notable dilatation of his left common and external iliac veins on down to the left common femoral vein.  There is soft tissue edema in the retroperitoneum consistent with his diagnosis of retroperitoneal fibrosis.  I also reviewed his lower extremity venous duplex which demonstrates acute DVT involving the entire left lower extremity from the distal external iliac vein to the tibial veins.   ASSESSMENT/PLAN: This is a 57 y.o. male with extensive left lower extremity dvt.  He does not appear to have phlegmasia on exam but will likely need endovascular intervention for this extensive DVT.  Given his recent contrast load as well as his elevated creatinine I would prefer to hydrate him first.  I will speak with his family tomorrow regarding timing of intervention to ensure that he is a candidate for lytic therapy.  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  Agree with heparin drip and hydration.  He is safe for ambulation after he achieved therapeutic aptt levels.    Gumecindo Hopkin C. Randie Heinz, MD [facility] Office: [phone] Pager: [phone]

## 2017-09-16 DIAGNOSIS — I80202 Phlebitis and thrombophlebitis of unspecified deep vessels of left lower extremity: Principal | ICD-10-CM

## 2017-09-16 LAB — CBC
HEMATOCRIT: 43.1 % (ref 39.0–52.0)
Hemoglobin: 13.7 g/dL (ref 13.0–17.0)
MCH: 28.5 pg (ref 26.0–34.0)
MCHC: 31.8 g/dL (ref 30.0–36.0)
MCV: 89.8 fL (ref 78.0–100.0)
Platelets: 146 10*3/uL — ABNORMAL LOW (ref 150–400)
RBC: 4.8 MIL/uL (ref 4.22–5.81)
RDW: 16.8 % — AB (ref 11.5–15.5)
WBC: 12.7 10*3/uL — AB (ref 4.0–10.5)

## 2017-09-16 LAB — BASIC METABOLIC PANEL
ANION GAP: 8 (ref 5–15)
BUN: 10 mg/dL (ref 6–20)
CALCIUM: 8.5 mg/dL — AB (ref 8.9–10.3)
CO2: 26 mmol/L (ref 22–32)
Chloride: 110 mmol/L (ref 101–111)
Creatinine, Ser: 1.03 mg/dL (ref 0.61–1.24)
GFR calc Af Amer: >60 mL/min (ref >60)
GLUCOSE: 89 mg/dL (ref 65–99)
POTASSIUM: 4 mmol/L (ref 3.5–5.1)
Sodium: 144 mmol/L (ref 135–145)

## 2017-09-16 LAB — APTT: aPTT: 100 seconds — ABNORMAL HIGH (ref 24–36)

## 2017-09-16 LAB — HEPARIN LEVEL (UNFRACTIONATED)
Heparin Unfractionated: 0.46 IU/mL (ref 0.30–0.70)
Heparin Unfractionated: 0.69 IU/mL (ref 0.30–0.70)

## 2017-09-16 LAB — HIV ANTIBODY (ROUTINE TESTING W REFLEX): HIV Screen 4th Generation wRfx: NONREACTIVE

## 2017-09-16 LAB — PROTIME-INR
INR: 1.13
PROTHROMBIN TIME: 14.4 s (ref 11.4–15.2)

## 2017-09-16 NOTE — ED Notes (Signed)
Attempted report x1. 

## 2017-09-16 NOTE — Progress Notes (Signed)
  Progress Note    09/16/2017 9:56 AM * No surgery found *  Subjective: Left leg still without pain  Vitals:   09/16/17 0845 09/16/17 0900  BP: (!) 106/93 129/89  Pulse: (!) 103 (!) 105  Resp: 19 15  Temp:    SpO2: 95% 97%    Physical Exam: Awake alert oriented Nonlabored respirations Abdomen is soft nontender nondistended Left leg about twice the size of right leg with nonpitting edema from thigh all the way through calf Color of both legs is equal Palpable dorsalis pedis on the left Left foot is sensorimotor intact   CBC    Component Value Date/Time   WBC 12.7 (H) 09/16/2017 0645   RBC 4.80 09/16/2017 0645   HGB 13.7 09/16/2017 0645   HCT 43.1 09/16/2017 0645   PLT 146 (L) 09/16/2017 0645   MCV 89.8 09/16/2017 0645   MCH 28.5 09/16/2017 0645   MCHC 31.8 09/16/2017 0645   RDW 16.8 (H) 09/16/2017 0645   LYMPHSABS 2.0 09/15/2017 2151   MONOABS 0.8 09/15/2017 2151   EOSABS 0.0 09/15/2017 2151   BASOSABS 0.0 09/15/2017 2151    BMET    Component Value Date/Time   NA 144 09/16/2017 0645   K 4.0 09/16/2017 0645   CL 110 09/16/2017 0645   CO2 26 09/16/2017 0645   GLUCOSE 89 09/16/2017 0645   BUN 10 09/16/2017 0645   CREATININE 1.03 09/16/2017 0645   CALCIUM 8.5 (L) 09/16/2017 0645   GFRNONAA >60 09/16/2017 0645   GFRAA >60 09/16/2017 0645    INR    Component Value Date/Time   INR 1.13 09/16/2017 0645    No intake or output data in the 24 hours ending 09/16/17 0956   Assessment:  57 y.o. male is here with extensive left lower extremity DVT.  Plan: Continue hydration and heparin drip N.p.o. past midnight for possible lysis initiation tomorrow and will likely be 2 stages. I have communicated this to both the patient and his mother via telephone.  They demonstrate good understanding.   Francesco Provencal C. Randie Heinz, MD [facility] Office: [phone] Pager: [phone]  09/16/2017 9:56 AM

## 2017-09-16 NOTE — Progress Notes (Signed)
PROGRESS NOTE    Mark [name]  ZOX:[id number] DOB: 03/03/1961 DOA: 09/15/2017 PCP: Fleet Contras, MD  Brief Narrative: 57 y.o. 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  History is obtained by his father, the patient is  unable to provide detailed history due to psychiatric issues.  His mother reports that yesterday, the patient did his family, and the father noted that he was favoring his left lower extremity when walking, and the mother noted that his leg was severely swollen, with tense edema.  The patient denies any worsening chest pain, pleuritic chest pain, nausea, vomiting, diaphoresis, hemoptysis.  He denies any headaches or vision changes.  He denies any abdominal pain, dysuria or gross hematuria.  Of note, he was told by his caregiver at the group home, that he has a "growth in his abdomen on the left ".  She has chronic neuropathy, but there are no worsening paresthesias.  He is leg is significantly larger than the right.  He denies any calf pain.  The patient is somewhat compliant with his medications, at times forgetting to take them.  He denies taking an aspirin a day.  He denies any alcohol or recreational drug use.  He does partake tobacco.  No recent long distance trips.  No recent surgeries.  No prior history of PE or DVT.  He is not on hormonal products.  No family history of clotting disorders.    ED Course:  BP (!) 142/98 (BP Location: Left Arm)   Pulse 92   Temp 98.1 F (36.7 C) (Oral)   Resp 20   Ht 5\' 11"  (1.803 m)   Wt 91.6 kg (202 lb)   SpO2 98%   BMI 28.17 kg/m   Of note his last CT angiogram of the abdomen and pelvis in 2018 showed  Inflammatory thickening around the infrarenal abdominal and common iliac arteries.  This is consistent with chronic periaortitis which is a spectrum of disease that includes idiopathic retroperitoneal fibrosis and isolated periaortitis as well as aneurysmal types. 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 Idiopathic retroperitoneal fibrosis usually involves other retroperitoneal structures as well, including the IVC and ureters. Sodium 141, potassium 4.2.  Bicarb 21.  Glucose 135.  Creatinine 1.34, with essentially normal GFR. Troponin negative Count 13.2 hemoglobin 14.7 platelets 160. Preliminary vascular studies show positive DVT for acute DVT involving the left entire left lower extremity, and the distal external iliac vein, ileum vein and IVC were not clearly visualized. PT 12.7, INR 0.96. She was placed on heparin infusion by pharmacy.      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  CT angiogram of the chest and abdomen and pelvis  No evidence of aortic dissection.  Abnormal soft tissue surrounding mid and distal abdominal aorta extending into the proximal  common iliac arteries, appears fairly concentric, question aortic wall thickening such as from a large vessel vasculitis though this could also be seen with retroperitoneal fibrosis; the smooth concentric nature makes this less likely to represent thrombus.  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  Recommend annual imaging followup by CTA or MRA. This recommendation follows 2010  ACCF/AHA/AATS/ACR/ASA/SCA/SCAI/SIR/STS/SVM Guidelines for the Diagnosis and Management of Patients with Thoracic Aortic Disease. Circulation. 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  Dilated LEFT common and external iliac veins extending into LEFT common femoral vein with surrounding soft tissue edema and associated significant edema of the proximal LEFT lower extremity highly suspicious for deep venous thrombosiBILATERAL renal cysts.  Mild RIGHT hydronephrosis and proximal hydroureter with 2 mm mid RIGHT ureteral calculuss;  2Decho to rule out heart strain  Continue heparin per pharmacy [facility] to see possible thrombectomy TOMORROW. Discuss non-oral anticoagulants vs coumadin versus new anticoagulant Follow-up results of EKG, CT angios of chest, abdomen and pelvis Bed rest   Leukocytosis, likely reactive due to above, steroids, COPD  ,  WBC 13 Afebrile    IVF   Repeat CBC in AM  History of renal fibrosis, the patient is on Imuran, Prednisone  followed [facility]ology [facility] Follow as OP with specialty   Bipolar disorder  Continue Klonopin, Clozaril, Depakote     Peripheral neuropathy, continue Neurontin  GERD, no acute symptoms Continue PPI   Hypertension BP   142/98    Pulse 92   Continue home anti-hypertensive medications   COPD without exacerbation Osats normal   WBC 13 (likely reactive and pt on prednisone)   Continue nebs       DVT prophylaxis: Heparin Code Status: Full code Family Communication: None Disposition Plan TBD Consultants:  Vascular Procedures: None Antimicrobials: None Subjective: Resting in no acute distress  Objective: Vitals:   09/16/17 1200 09/16/17 1215 09/16/17 1230 09/16/17 1245  BP:   (!) 116/56 (!) 111/53  Pulse: 97 92 87 91  Resp: 15 (!) Temp:      TempSrc:      SpO2: 98% 100% 96% 96%  Weight:      Height:       No intake or  output data in the 24 hours ending 09/16/17 1251 Filed Weights   09/15/17 1003  Weight: 91.6 kg (202 lb)    Examination:  General exam: Appears calm and comfortable  Respiratory system: Clear to auscultation. Respiratory effort normal. Cardiovascular system: S1 & S2 heard, RRR. No JVD, murmurs, rubs, gallops or clicks. No pedal edema. Gastrointestinal system: Abdomen is nondistended, soft and nontender. No organomegaly or masses felt. Normal bowel sounds heard. Central nervous system: Alert and oriented. No focal neurological deficits. Extremities: Symmetric 5 x 5 power. Skin: No rashes, lesions or ulcers Psychiatry: Judgement and insight appear normal. Mood & affect appropriate.     Data Reviewed: I have personally reviewed following labs and imaging studies  CBC: Recent Labs  Lab 09/15/17 1117 09/15/17 2151 09/16/17 0645  WBC 13.2* 13.1* 12.7*  NEUTROABS  --  10.3*  --   HGB 14.7 15.3 13.7  HCT 46.7 46.9 43.1  MCV 90.3 89.7 89.8  PLT 160 161 146*   Basic Metabolic Panel: Recent Labs  Lab 09/15/17 1117 09/16/17 0645  NA 141 144  K 4.2 4.0  CL 106 110  CO2 21* 26  GLUCOSE 135* 89  BUN 12 10  CREATININE 1.34* 1.03  CALCIUM 9.3 8.5*   GFR: Estimated Creatinine Clearance: 93.8 mL/min (by C-G formula based on SCr of 1.03 mg/dL). Liver Function Tests: No results for input(s): AST, ALT, ALKPHOS, BILITOT, PROT, ALBUMIN in the last 168 hours. No results for input(s): LIPASE, AMYLASE in the last 168 hours. No results for input(s): AMMONIA in the last 168 hours. Coagulation Profile: Recent Labs  Lab 09/15/17 1440 09/16/17 0645  INR 0.96 1.13   Cardiac Enzymes: No results for input(s): CKTOTAL, CKMB, CKMBINDEX, TROPONINI in the last 168 hours. BNP (last 3 results) No results for input(s): PROBNP in the last 8760 hours. HbA1C: No results for input(s): HGBA1C in the last 72 hours. CBG: No results for input(s): GLUCAP in the last 168 hours. Lipid Profile: No  results for input(s): CHOL, HDL, LDLCALC, TRIG, CHOLHDL, LDLDIRECT in the last 72 hours. Thyroid Function Tests: No results for input(s): TSH, T4TOTAL, FREET4, T3FREE, THYROIDAB in the last 72 hours. Anemia Panel: No results for input(s): VITAMINB12, FOLATE, FERRITIN, TIBC, IRON, RETICCTPCT in the last 72 hours. Sepsis Labs: No results for input(s): PROCALCITON, LATICACIDVEN in the last 168 hours.  No results found for this or any previous visit (from the past 240 hour(s)).       Radiology Studies: Ct Angio Chest Pe W And/or Wo Contrast  Result Date: 09/15/2017 CLINICAL DATA:  LEFT lower extremity swelling, growth of abdomen, COPD, hypertension, smoker EXAM: CT ANGIOGRAPHY CHEST, ABDOMEN AND PELVIS TECHNIQUE: Multidetector CT imaging through the chest, abdomen and pelvis was performed using the standard protocol during bolus administration of intravenous contrast. Multiplanar reconstructed images and MIPs were obtained and reviewed to evaluate the vascular anatomy. CONTRAST:  ISOVUE-370 IOPAMIDOL (ISOVUE-370) INJECTION 76% IV. No oral contrast. COMPARISON:  None FINDINGS: CTA CHEST FINDINGS Cardiovascular: Mild aneurysmal dilatation of the ascending thoracic aorta 4.0 cm transverse image 53. No aortic dissection. No pericardial effusion. Pulmonary arteries well opacified and patent. No evidence of pulmonary embolism. Mediastinum/Nodes: Scattered air within esophagus without gross wall thickening. Base of cervical region normal appearance. No thoracic adenopathy. Lungs/Pleura: Dependent atelectasis in the posterior lungs bilaterally. No acute infiltrate, pleural effusion or pneumothorax. Musculoskeletal: Osseous structures unremarkable. Review of the MIP images confirms the above findings. CTA ABDOMEN AND PELVIS FINDINGS VASCULAR Aorta: Normal caliber without aneurysm or dissection. Abnormal soft tissue density surrounding the contrast opacified aortic lumen, favor aortic wall thickening over  thrombus but retroperitoneal fibrosis not completely excluded. Question of vasculitis is raised. 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 Vessel otherwise patent. SMA: Widely patent Renals: Patent. Single LEFT renal artery. On RIGHT a main renal artery and a small accessory renal artery are noted. IMA: Patent Inflow: Widely patent, normal appearance Veins: Portal, superior mesenteric and splenic veins patent. IVC appears decompressed. Dilated LEFT common iliac and external iliac veins extending into LEFT common femoral vein with surrounding infiltrative changes and impaired enhancement on delayed images, highly suspicious for deep venous thrombosis. Review of the MIP images confirms the above findings. NON-VASCULAR Hepatobiliary: Gallbladder and liver normal appearance Pancreas: Normal appearance Spleen: Normal appearance Adrenals/Urinary Tract: BILATERAL renal cysts. Mild RIGHT hydronephrosis and hydroureter. 2 mm mid RIGHT ureteral calculus image 148. No LEFT side hydronephrosis or ureteral dilatation. Bladder is mildly distended, slightly asymmetric into the upper RIGHT pelvis without obvious mass or wall thickening. Stomach/Bowel: Upper normal caliber appendix without inflammation or wall thickening. Stomach and bowel loops otherwise unremarkable. Lymphatic: No adenopathy Reproductive: Unremarkable prostate  gland and seminal vesicles Other: Associated retroperitoneal edema adjacent to the LEFT common and external iliac veins extent. Significant soft tissue edema including both intermuscular and intramuscular edema at the proximal LEFT lower extremity. No free intraperitoneal air or fluid. Small umbilical hernia containing fat. Musculoskeletal: Retrolisthesis at L5-S1 with disc degeneration, vacuum phenomenon, and mild bulging disc. Narrowing of spinal canal at L4-L5 with question broad-based disc herniation versus bulge. Review of the MIP images  confirms the above findings. IMPRESSION: No evidence of aortic dissection. Abnormal soft tissue surrounding mid and distal abdominal aorta extending into the proximal common iliac arteries, appears fairly concentric, question aortic wall thickening such as from a large vessel vasculitis though this could also be seen with retroperitoneal fibrosis; the smooth concentric nature makes this less likely to represent thrombus. 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 Recommend annual imaging followup by CTA or MRA. This recommendation follows 2010 ACCF/AHA/AATS/ACR/ASA/SCA/SCAI/SIR/STS/SVM Guidelines for the Diagnosis and Management of Patients with Thoracic Aortic Disease. Circulation. 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 Dilated LEFT common and external iliac veins extending into LEFT common femoral vein with surrounding soft tissue edema and associated significant edema of the proximal LEFT lower extremity highly suspicious for deep venous thrombosis; venous duplex imaging recommended. BILATERAL renal cysts. Mild RIGHT hydronephrosis and proximal hydroureter with 2 mm mid RIGHT ureteral calculus. Findings called to Dr. Scotty Court on 09/15/2017 at 1920 hours. Electronically Signed   By: Ulyses Southward M.D.   On: 09/15/2017 19:23   Ct Angio Abd/pel W/ And/or W/o  Result Date: 09/15/2017 CLINICAL DATA:  LEFT lower extremity swelling, growth of abdomen, COPD, hypertension, smoker EXAM: CT ANGIOGRAPHY CHEST, ABDOMEN AND PELVIS TECHNIQUE: Multidetector CT imaging through the chest, abdomen and pelvis was performed using the standard protocol during bolus administration of intravenous contrast. Multiplanar reconstructed images and MIPs were obtained and reviewed to evaluate the vascular anatomy. CONTRAST:  ISOVUE-370 IOPAMIDOL (ISOVUE-370) INJECTION 76% IV. No oral contrast. COMPARISON:  None  FINDINGS: CTA CHEST FINDINGS Cardiovascular: Mild aneurysmal dilatation of the ascending thoracic aorta 4.0 cm transverse image 53. No aortic dissection. No pericardial effusion. Pulmonary arteries well opacified and patent. No evidence of pulmonary embolism. Mediastinum/Nodes: Scattered air within esophagus without gross wall thickening. Base of cervical region normal appearance. No thoracic adenopathy. Lungs/Pleura: Dependent atelectasis in the posterior lungs bilaterally. No acute infiltrate, pleural effusion or pneumothorax. Musculoskeletal: Osseous structures unremarkable. Review of the MIP images confirms the above findings. CTA ABDOMEN AND PELVIS FINDINGS VASCULAR Aorta: Normal caliber without aneurysm or dissection. Abnormal soft tissue density surrounding the contrast opacified aortic lumen, favor aortic wall thickening over thrombus but retroperitoneal fibrosis not completely excluded. Question of vasculitis is raised. 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 Vessel otherwise patent. SMA: Widely patent Renals: Patent. Single LEFT renal artery. On RIGHT a main renal artery and a small accessory renal artery are noted. IMA: Patent Inflow: Widely patent, normal appearance Veins: Portal, superior mesenteric and splenic veins patent. IVC appears decompressed. Dilated LEFT common iliac and external iliac veins extending into LEFT common femoral vein with surrounding infiltrative changes and impaired enhancement on delayed images, highly suspicious for deep venous thrombosis. Review of the MIP images confirms the above findings. NON-VASCULAR Hepatobiliary: Gallbladder and liver normal appearance Pancreas: Normal appearance Spleen: Normal appearance Adrenals/Urinary Tract: BILATERAL renal cysts. Mild RIGHT hydronephrosis and hydroureter. 2 mm mid RIGHT ureteral  calculus image 148. No LEFT side hydronephrosis or ureteral dilatation. Bladder is  mildly distended, slightly asymmetric into the upper RIGHT pelvis without obvious mass or wall thickening. Stomach/Bowel: Upper normal caliber appendix without inflammation or wall thickening. Stomach and bowel loops otherwise unremarkable. Lymphatic: No adenopathy Reproductive: Unremarkable prostate gland and seminal vesicles Other: Associated retroperitoneal edema adjacent to the LEFT common and external iliac veins extent. Significant soft tissue edema including both intermuscular and intramuscular edema at the proximal LEFT lower extremity. No free intraperitoneal air or fluid. Small umbilical hernia containing fat. Musculoskeletal: Retrolisthesis at L5-S1 with disc degeneration, vacuum phenomenon, and mild bulging disc. Narrowing of spinal canal at L4-L5 with question broad-based disc herniation versus bulge. Review of the MIP images confirms the above findings. IMPRESSION: No evidence of aortic dissection. Abnormal soft tissue surrounding mid and distal abdominal aorta extending into the proximal common iliac arteries, appears fairly concentric, question aortic wall thickening such as from a large vessel vasculitis though this could also be seen with retroperitoneal fibrosis; the smooth concentric nature makes this less likely to represent thrombus. 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 Recommend annual imaging followup by CTA or MRA. This recommendation follows 2010 ACCF/AHA/AATS/ACR/ASA/SCA/SCAI/SIR/STS/SVM Guidelines for the Diagnosis and Management of Patients with Thoracic Aortic Disease. Circulation. 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 Dilated LEFT common and external iliac veins extending into LEFT common femoral vein with surrounding soft tissue edema and associated significant edema of the proximal LEFT lower extremity highly suspicious for deep venous thrombosis; venous duplex  imaging recommended. BILATERAL renal cysts. Mild RIGHT hydronephrosis and proximal hydroureter with 2 mm mid RIGHT ureteral calculus. Findings called to Dr. Scotty Court on 09/15/2017 at 1920 hours. Electronically Signed   By: Ulyses Southward M.D.   On: 09/15/2017 19:23        Scheduled Meds: . atropine  2 drop Sublingual Daily  . clonazePAM  1 mg Oral QHS  . cloZAPine  300 mg Oral BID  . diclofenac sodium  2 g Topical QID  . divalproex  500 mg Oral QHS  . gabapentin  100 mg Oral BID  . pantoprazole  40 mg Oral Daily  . predniSONE  10 mg Oral Daily  . propranolol  10 mg Oral Daily   Continuous Infusions: . sodium chloride 75 mL/hr at 09/15/17 1841  . heparin 1,500 Units/hr (09/15/17 1547)     LOS: 1 day     Alwyn Ren, MD [facility] Pager [phone]  If 7PM-7AM, please contact night-coverage [email or web address] Password [facility] 09/16/2017, 12:51 PM

## 2017-09-16 NOTE — Progress Notes (Signed)
ANTICOAGULATION CONSULT NOTE   Pharmacy Consult for Heparin Indication: DVT  No Known Allergies  Patient Measurements: Height:  (180.3 cm) Weight: 202 lb (91.6 kg) IBW/kg (Calculated) : 75.3 Heparin Dosing Weight: 91 kg   Vital Signs: Temp: 98.1 F (36.7 C) (05/10 1406) Temp Source: Oral (05/10 1406) BP: 129/93 (05/11 0000) Pulse Rate: 103 (05/11 0000)  Labs: Recent Labs    09/15/17 1117 09/15/17 1440 09/15/17 2151 09/15/17 2339  HGB 14.7  --  15.3  --   HCT 46.7  --  46.9  --   PLT 160  --  161  --   LABPROT  --  12.7  --   --   INR  --  0.96  --   --   HEPARINUNFRC  --   --   --  0.46  CREATININE 1.34*  --   --   --     Estimated Creatinine Clearance: 72.1 mL/min (A) (by C-G formula based on SCr of 1.34 mg/dL (H)).   Medical History: Past Medical History:  Diagnosis Date  . COPD (chronic obstructive pulmonary disease) (HCC)   . Hypertension   . Schizophrenia (HCC)      Assessment: 43 YOM with new LLE DVT to start IV heparin. H/H and Plt wnl. Patient is not on any anticoagulation prior to admission  5/11 AM update:: initial heparin level is therapeutic   Goal of Therapy:  Heparin level 0.3-0.7 units/ml Monitor platelets by anticoagulation protocol: Yes   Plan:  -Cont heparin at 1500 units/hr -Confirmatory heparin level with AM labs  Abran Duke, PharmD, BCPS Clinical Pharmacist Phone: [phone]

## 2017-09-16 NOTE — Progress Notes (Signed)
ANTICOAGULATION CONSULT NOTE   Pharmacy Consult for Heparin Indication: DVT  No Known Allergies  Patient Measurements: Height:  (180.3 cm) Weight: 202 lb (91.6 kg) IBW/kg (Calculated) : 75.3 Heparin Dosing Weight: 91 kg   Vital Signs: BP: 117/69 (05/11 1315) Pulse Rate: 96 (05/11 1315)  Labs: Recent Labs    09/15/17 1117 09/15/17 1440 09/15/17 2151 09/15/17 2339 09/16/17 0645  HGB 14.7  --  15.3  --  13.7  HCT 46.7  --  46.9  --  43.1  PLT 160  --  161  --  146*  APTT  --   --   --   --  100*  LABPROT  --  12.7  --   --  14.4  INR  --  0.96  --   --  1.13  HEPARINUNFRC  --   --   --  0.46 0.69  CREATININE 1.34*  --   --   --  1.03    Estimated Creatinine Clearance: 93.8 mL/min (by C-G formula based on SCr of 1.03 mg/dL).   Medical History: Past Medical History:  Diagnosis Date  . COPD (chronic obstructive pulmonary disease) (HCC)   . Hypertension   . Schizophrenia (HCC)      Assessment: 2 YOM with new LLE DVT to start IV heparin. H/H and Plt wnl. Patient is not on any anticoagulation prior to admission  Follow up level within therapeutic range at 0.6. No bleeding issues noted. Hgb down slightly to 13.7.   Plan for possible lysis tomorrow.   Goal of Therapy:  Heparin level 0.3-0.7 units/ml Monitor platelets by anticoagulation protocol: Yes   Plan:  -Cont heparin at 1500 units/hr -Daily heparin level  Sheppard Coil PharmD., BCPS Clinical Pharmacist 09/16/2017 1:32 PM

## 2017-09-17 ENCOUNTER — Encounter (HOSPITAL_COMMUNITY)
Admission: EM | Disposition: A | Discharge status: Home / Self Care | Payer: Self-pay | Source: Home / Self Care | Attending: Internal Medicine

## 2017-09-17 ENCOUNTER — Inpatient Hospital Stay (HOSPITAL_COMMUNITY): Payer: Medicaid Other | Admitting: Certified Registered"

## 2017-09-17 HISTORY — PX: VENOGRAM: SHX5497

## 2017-09-17 HISTORY — PX: THROMBECTOMY FEMORAL ARTERY: SHX6406

## 2017-09-17 LAB — CBC
HCT: 45.5 % (ref 39.0–52.0)
HCT: 46.2 % (ref 39.0–52.0)
HEMATOCRIT: 45 % (ref 39.0–52.0)
HEMOGLOBIN: 14.6 g/dL (ref 13.0–17.0)
HEMOGLOBIN: 15 g/dL (ref 13.0–17.0)
Hemoglobin: 14.4 g/dL (ref 13.0–17.0)
MCH: 28.5 pg (ref 26.0–34.0)
MCH: 28.9 pg (ref 26.0–34.0)
MCH: 29.2 pg (ref 26.0–34.0)
MCHC: 31.6 g/dL (ref 30.0–36.0)
MCHC: 32.4 g/dL (ref 30.0–36.0)
MCHC: 32.5 g/dL (ref 30.0–36.0)
MCV: 90.1 fL (ref 78.0–100.0)
MCV: 88.9 fL (ref 78.0–100.0)
MCV: 89.9 fL (ref 78.0–100.0)
PLATELETS: 164 10*3/uL (ref 150–400)
Platelets: 162 10*3/uL (ref 150–400)
Platelets: 147 10*3/uL — ABNORMAL LOW (ref 150–400)
RBC: 5.05 MIL/uL (ref 4.22–5.81)
RBC: 5.06 MIL/uL (ref 4.22–5.81)
RBC: 5.14 MIL/uL (ref 4.22–5.81)
RDW: 16.9 % — AB (ref 11.5–15.5)
RDW: 16.7 % — ABNORMAL HIGH (ref 11.5–15.5)
RDW: 16.8 % — ABNORMAL HIGH (ref 11.5–15.5)
WBC: 12.5 10*3/uL — ABNORMAL HIGH (ref 4.0–10.5)
WBC: 10.8 10*3/uL — ABNORMAL HIGH (ref 4.0–10.5)
WBC: 14.8 10*3/uL — ABNORMAL HIGH (ref 4.0–10.5)

## 2017-09-17 LAB — MRSA PCR SCREENING: MRSA BY PCR: NEGATIVE

## 2017-09-17 LAB — HEPARIN LEVEL (UNFRACTIONATED)
HEPARIN UNFRACTIONATED: 0.5 [IU]/mL (ref 0.30–0.70)
Heparin Unfractionated: 1.1 IU/mL — ABNORMAL HIGH (ref 0.30–0.70)
Heparin Unfractionated: 0.2 IU/mL — ABNORMAL LOW (ref 0.30–0.70)

## 2017-09-17 LAB — FIBRINOGEN
Fibrinogen: 497 mg/dL — ABNORMAL HIGH (ref 210–475)
Fibrinogen: 482 mg/dL — ABNORMAL HIGH (ref 210–475)

## 2017-09-17 LAB — GLUCOSE, CAPILLARY: Glucose-Capillary: 136 mg/dL — ABNORMAL HIGH (ref 65–99)

## 2017-09-17 SURGERY — VENOGRAM
Anesthesia: General | Laterality: Left

## 2017-09-17 MED ORDER — SODIUM CHLORIDE 0.9 % IV SOLN
1.0000 mg/h | INTRAVENOUS | Status: DC
  Administered 2017-09-17 (×2): 1 mg/h
  Filled 2017-09-17 (×3): qty 10

## 2017-09-17 MED ORDER — MIDAZOLAM HCL 2 MG/2ML IJ SOLN
INTRAMUSCULAR | Status: AC
  Filled 2017-09-17: qty 2

## 2017-09-17 MED ORDER — PHENYLEPHRINE HCL 10 MG/ML IJ SOLN
INTRAMUSCULAR | Status: DC | PRN
  Administered 2017-09-17 (×3): 80 ug via INTRAVENOUS

## 2017-09-17 MED ORDER — SUGAMMADEX SODIUM 200 MG/2ML IV SOLN
INTRAVENOUS | Status: DC | PRN
  Administered 2017-09-17: 100 mg via INTRAVENOUS
  Administered 2017-09-17: 200 mg via INTRAVENOUS

## 2017-09-17 MED ORDER — CEFAZOLIN SODIUM-DEXTROSE 1-4 GM/50ML-% IV SOLN
1.0000 g | Freq: Three times a day (TID) | INTRAVENOUS | Status: DC
  Administered 2017-09-17 – 2017-09-18 (×2): 1 g via INTRAVENOUS
  Filled 2017-09-17 (×4): qty 50

## 2017-09-17 MED ORDER — SODIUM CHLORIDE 0.9% FLUSH: 3.0000 mL | INTRAVENOUS | Status: DC | PRN

## 2017-09-17 MED ORDER — LACTATED RINGERS IV SOLN
INTRAVENOUS | Status: DC
Start: 2017-09-17 — End: 2017-09-17
  Administered 2017-09-17: 13:00:00 via INTRAVENOUS

## 2017-09-17 MED ORDER — LABETALOL HCL 5 MG/ML IV SOLN
INTRAVENOUS | Status: AC
  Filled 2017-09-17: qty 4

## 2017-09-17 MED ORDER — NALOXONE HCL 0.4 MG/ML IJ SOLN
INTRAMUSCULAR | Status: DC | PRN
  Administered 2017-09-17: 80 ug via INTRAVENOUS

## 2017-09-17 MED ORDER — MIDAZOLAM HCL 2 MG/2ML IJ SOLN
INTRAMUSCULAR | Status: DC | PRN
  Administered 2017-09-17: 1 mg via INTRAVENOUS

## 2017-09-17 MED ORDER — SODIUM CHLORIDE 0.9 % IV SOLN
INTRAVENOUS | Status: DC | PRN
  Administered 2017-09-17: 500 mL

## 2017-09-17 MED ORDER — MIDAZOLAM HCL 2 MG/2ML IJ SOLN: 1.0000 mg | INTRAMUSCULAR | Status: DC | PRN

## 2017-09-17 MED ORDER — FENTANYL CITRATE (PF) 250 MCG/5ML IJ SOLN
INTRAMUSCULAR | Status: DC | PRN
  Administered 2017-09-17: 100 ug via INTRAVENOUS

## 2017-09-17 MED ORDER — IODIXANOL 320 MG/ML IV SOLN
INTRAVENOUS | Status: DC | PRN
  Administered 2017-09-17: 10 mL via INTRA_ARTERIAL

## 2017-09-17 MED ORDER — SODIUM CHLORIDE 0.9 % IV SOLN
INTRAVENOUS | Status: AC
  Filled 2017-09-17: qty 1.2

## 2017-09-17 MED ORDER — FENTANYL CITRATE (PF) 250 MCG/5ML IJ SOLN
INTRAMUSCULAR | Status: AC
  Filled 2017-09-17: qty 5

## 2017-09-17 MED ORDER — ALBUTEROL SULFATE HFA 108 (90 BASE) MCG/ACT IN AERS
INHALATION_SPRAY | RESPIRATORY_TRACT | Status: AC
  Filled 2017-09-17: qty 6.7

## 2017-09-17 MED ORDER — HEPARIN (PORCINE) IN NACL 100-0.45 UNIT/ML-% IJ SOLN
800.0000 [IU]/h | INTRAMUSCULAR | Status: DC
  Administered 2017-09-17 (×2): 800 [IU]/h via INTRAVENOUS

## 2017-09-17 MED ORDER — MORPHINE SULFATE (PF) 4 MG/ML IV SOLN
5.0000 mg | INTRAVENOUS | Status: DC | PRN
  Administered 2017-09-17: 5 mg via INTRAVENOUS
  Filled 2017-09-17: qty 2

## 2017-09-17 MED ORDER — SODIUM CHLORIDE 0.9% FLUSH
3.0000 mL | Freq: Two times a day (BID) | INTRAVENOUS | Status: DC
  Administered 2017-09-17: 10 mL via INTRAVENOUS
  Administered 2017-09-18: 3 mL via INTRAVENOUS

## 2017-09-17 MED ORDER — SODIUM CHLORIDE 0.9 % IJ SOLN
INTRAMUSCULAR | Status: AC
  Filled 2017-09-17: qty 20

## 2017-09-17 MED ORDER — ONDANSETRON HCL 4 MG/2ML IJ SOLN: 4.0000 mg | Freq: Four times a day (QID) | INTRAMUSCULAR | Status: DC | PRN

## 2017-09-17 MED ORDER — HEMOSTATIC AGENTS (NO CHARGE) OPTIME
TOPICAL | Status: DC | PRN
  Administered 2017-09-17: 1 via TOPICAL

## 2017-09-17 MED ORDER — DEXTROSE 5 % IV SOLN
INTRAVENOUS | Status: DC | PRN
  Administered 2017-09-17: 25 ug/min via INTRAVENOUS

## 2017-09-17 MED ORDER — SUGAMMADEX SODIUM 200 MG/2ML IV SOLN
INTRAVENOUS | Status: AC
  Filled 2017-09-17: qty 2

## 2017-09-17 MED ORDER — ONDANSETRON HCL 4 MG/2ML IJ SOLN
INTRAMUSCULAR | Status: DC | PRN
  Administered 2017-09-17: 4 mg via INTRAVENOUS

## 2017-09-17 MED ORDER — SODIUM CHLORIDE 0.9 % IV SOLN: 250.0000 mL | INTRAVENOUS | Status: DC | PRN

## 2017-09-17 MED ORDER — EPHEDRINE SULFATE 50 MG/ML IJ SOLN
INTRAMUSCULAR | Status: AC
  Filled 2017-09-17: qty 2

## 2017-09-17 MED ORDER — ROCURONIUM BROMIDE 10 MG/ML (PF) SYRINGE
PREFILLED_SYRINGE | INTRAVENOUS | Status: DC | PRN
  Administered 2017-09-17: 40 mg via INTRAVENOUS

## 2017-09-17 MED ORDER — 0.9 % SODIUM CHLORIDE (POUR BTL) OPTIME
TOPICAL | Status: DC | PRN
  Administered 2017-09-17: 1000 mL

## 2017-09-17 MED ORDER — PROTAMINE SULFATE 10 MG/ML IV SOLN
INTRAVENOUS | Status: AC
  Filled 2017-09-17: qty 5

## 2017-09-17 MED ORDER — SODIUM CHLORIDE 0.9 % IV SOLN
INTRAVENOUS | Status: DC
  Administered 2017-09-17 – 2017-09-18 (×3): via INTRAVENOUS

## 2017-09-17 MED ORDER — FENTANYL CITRATE (PF) 100 MCG/2ML IJ SOLN: 25.0000 ug | INTRAMUSCULAR | Status: DC | PRN

## 2017-09-17 MED ORDER — LIDOCAINE 2% (20 MG/ML) 5 ML SYRINGE
INTRAMUSCULAR | Status: DC | PRN
  Administered 2017-09-17: 80 mg via INTRAVENOUS

## 2017-09-17 MED ORDER — HEPARIN SODIUM (PORCINE) 1000 UNIT/ML IJ SOLN
INTRAMUSCULAR | Status: AC
  Filled 2017-09-17: qty 2

## 2017-09-17 MED ORDER — HEPARIN SODIUM (PORCINE) 1000 UNIT/ML IJ SOLN
INTRAMUSCULAR | Status: DC | PRN
  Administered 2017-09-17: 5000 [IU] via INTRAVENOUS

## 2017-09-17 MED ORDER — ONDANSETRON HCL 4 MG/2ML IJ SOLN: 4.0000 mg | Freq: Once | INTRAMUSCULAR | Status: DC | PRN

## 2017-09-17 MED ORDER — PROPOFOL 10 MG/ML IV BOLUS
INTRAVENOUS | Status: DC | PRN
  Administered 2017-09-17: 150 mg via INTRAVENOUS

## 2017-09-17 MED ORDER — CEFAZOLIN SODIUM-DEXTROSE 2-3 GM-%(50ML) IV SOLR
INTRAVENOUS | Status: DC | PRN
  Administered 2017-09-17: 2 g via INTRAVENOUS

## 2017-09-17 MED ORDER — EPHEDRINE SULFATE 50 MG/ML IJ SOLN
INTRAMUSCULAR | Status: DC | PRN
  Administered 2017-09-17 (×2): 5 mg via INTRAVENOUS

## 2017-09-17 SURGICAL SUPPLY — 100 items
BAG SNAP BAND KOVER 36X36 (MISCELLANEOUS) ×2 IMPLANT
BANDAGE ACE 4X5 VEL STRL LF (GAUZE/BANDAGES/DRESSINGS) IMPLANT
BANDAGE ESMARK 6X9 LF (GAUZE/BANDAGES/DRESSINGS) IMPLANT
BENZOIN TINCTURE PRP APPL 2/3 (GAUZE/BANDAGES/DRESSINGS) ×2 IMPLANT
BIOPATCH RED 1 DISK 7.0 (GAUZE/BANDAGES/DRESSINGS) ×2 IMPLANT
BLADE SURG 11 STRL SS (BLADE) ×2 IMPLANT
BNDG ESMARK 6X9 LF (GAUZE/BANDAGES/DRESSINGS)
CANISTER SUCT 3000ML PPV (MISCELLANEOUS) ×2 IMPLANT
CANNULA VESSEL 3MM 2 BLNT TIP (CANNULA) IMPLANT
CATH ANGIO 5F BER2 65CM (CATHETERS) ×2 IMPLANT
CATH EMB 3FR 80CM (CATHETERS) IMPLANT
CATH EMB 4FR 80CM (CATHETERS) IMPLANT
CATH EMB 5FR 80CM (CATHETERS) IMPLANT
CATH INFUS 135CMX50CM (CATHETERS) ×4 IMPLANT
CATH OMNI FLUSH .035X70CM (CATHETERS) IMPLANT
CATH QUICKCROSS SUPP .035X90CM (MICROCATHETER) ×2 IMPLANT
CATH VISIONS PV .035 IVUS (CATHETERS) ×2 IMPLANT
CLIP VESOCCLUDE MED 24/CT (CLIP) IMPLANT
CLIP VESOCCLUDE MED 6/CT (CLIP) IMPLANT
CLIP VESOCCLUDE SM WIDE 24/CT (CLIP) ×2 IMPLANT
CLIP VESOCCLUDE SM WIDE 6/CT (CLIP) IMPLANT
COVER DOME SNAP 22 D (MISCELLANEOUS) ×2 IMPLANT
COVER PROBE W GEL 5X96 (DRAPES) IMPLANT
COVER SURGICAL LIGHT HANDLE (MISCELLANEOUS) IMPLANT
CUFF TOURNIQUET SINGLE 24IN (TOURNIQUET CUFF) IMPLANT
CUFF TOURNIQUET SINGLE 34IN LL (TOURNIQUET CUFF) IMPLANT
CUFF TOURNIQUET SINGLE 44IN (TOURNIQUET CUFF) IMPLANT
DERMABOND ADVANCED (GAUZE/BANDAGES/DRESSINGS) ×1
DERMABOND ADVANCED .7 DNX12 (GAUZE/BANDAGES/DRESSINGS) ×1 IMPLANT
DEVICE TORQUE H2O (MISCELLANEOUS) ×2 IMPLANT
DRAIN CHANNEL 15F RND FF W/TCR (WOUND CARE) IMPLANT
DRAPE HALF SHEET 40X57 (DRAPES) IMPLANT
DRAPE X-RAY CASS 24X20 (DRAPES) IMPLANT
DRSG TEGADERM 4X4.75 (GAUZE/BANDAGES/DRESSINGS) ×2 IMPLANT
ELECT REM PT RETURN 9FT ADLT (ELECTROSURGICAL) ×2
ELECTRODE REM PT RTRN 9FT ADLT (ELECTROSURGICAL) ×1 IMPLANT
EVACUATOR SILICONE 100CC (DRAIN) IMPLANT
GAUZE SPONGE 4X4 12PLY STRL LF (GAUZE/BANDAGES/DRESSINGS) ×2 IMPLANT
GLIDEWIRE ANGLED SS 035X260CM (WIRE) ×2 IMPLANT
GLOVE BIO SURGEON STRL SZ 6.5 (GLOVE) ×2 IMPLANT
GLOVE BIO SURGEON STRL SZ7.5 (GLOVE) ×4 IMPLANT
GLOVE BIOGEL PI IND STRL 6.5 (GLOVE) ×1 IMPLANT
GLOVE BIOGEL PI IND STRL 7.0 (GLOVE) ×1 IMPLANT
GLOVE BIOGEL PI INDICATOR 6.5 (GLOVE) ×1
GLOVE BIOGEL PI INDICATOR 7.0 (GLOVE) ×1
GOWN STRL REUS W/ TWL LRG LVL3 (GOWN DISPOSABLE) ×2 IMPLANT
GOWN STRL REUS W/ TWL XL LVL3 (GOWN DISPOSABLE) ×1 IMPLANT
GOWN STRL REUS W/TWL LRG LVL3 (GOWN DISPOSABLE) ×2
GOWN STRL REUS W/TWL XL LVL3 (GOWN DISPOSABLE) ×1
HEMOSTAT SNOW SURGICEL 2X4 (HEMOSTASIS) IMPLANT
INSERT FOGARTY SM (MISCELLANEOUS) IMPLANT
INTRODUCER AVANTI 5FR (MISCELLANEOUS) ×2 IMPLANT
INTRODUCER AVANTI 6FR (MISCELLANEOUS) ×2 IMPLANT
KIT BASIN OR (CUSTOM PROCEDURE TRAY) ×2 IMPLANT
KIT TURNOVER KIT B (KITS) ×2 IMPLANT
MARKER GRAFT CORONARY BYPASS (MISCELLANEOUS) IMPLANT
NEEDLE PERC 18GX7CM (NEEDLE) ×2 IMPLANT
NS IRRIG 1000ML POUR BTL (IV SOLUTION) ×4 IMPLANT
PACK CV ACCESS (CUSTOM PROCEDURE TRAY) IMPLANT
PACK GENERAL/GYN (CUSTOM PROCEDURE TRAY) IMPLANT
PACK PERIPHERAL VASCULAR (CUSTOM PROCEDURE TRAY) ×4 IMPLANT
PAD ARMBOARD 7.5X6 YLW CONV (MISCELLANEOUS) ×4 IMPLANT
PADDING CAST ABS 6INX4YD NS (CAST SUPPLIES)
PADDING CAST ABS COTTON 6X4 NS (CAST SUPPLIES) IMPLANT
PROTECTION STATION PRESSURIZED (MISCELLANEOUS) ×2
SET COLLECT BLD 21X3/4 12 (NEEDLE) IMPLANT
SET MICROPUNCTURE 5F STIFF (MISCELLANEOUS) ×2 IMPLANT
SHEATH AVANTI 11CM 5FR (SHEATH) ×2 IMPLANT
SHEATH PINNACLE 8F 10CM (SHEATH) ×2 IMPLANT
SLEEVE ISOL F/PACE RF HD COVER (MISCELLANEOUS) ×4 IMPLANT
STATION PROTECTION PRESSURIZED (MISCELLANEOUS) ×1 IMPLANT
STOPCOCK 4 WAY LG BORE MALE ST (IV SETS) IMPLANT
STOPCOCK MORSE 400PSI 3WAY (MISCELLANEOUS) IMPLANT
SUT ETHILON 3 0 PS 1 (SUTURE) IMPLANT
SUT GORETEX 6.0 TT13 (SUTURE) IMPLANT
SUT GORETEX 6.0 TT9 (SUTURE) IMPLANT
SUT MNCRL AB 4-0 PS2 18 (SUTURE) ×4 IMPLANT
SUT PROLENE 5 0 C 1 24 (SUTURE) ×2 IMPLANT
SUT PROLENE 6 0 BV (SUTURE) ×2 IMPLANT
SUT PROLENE 7 0 BV 1 (SUTURE) IMPLANT
SUT SILK 2 0 SH (SUTURE) IMPLANT
SUT SILK 3 0 (SUTURE)
SUT SILK 3-0 18XBRD TIE 12 (SUTURE) IMPLANT
SUT VIC AB 2-0 CT1 27 (SUTURE)
SUT VIC AB 2-0 CT1 TAPERPNT 27 (SUTURE) IMPLANT
SUT VIC AB 3-0 SH 27 (SUTURE)
SUT VIC AB 3-0 SH 27X BRD (SUTURE) IMPLANT
SYR 10ML LL (SYRINGE) ×2 IMPLANT
SYR 20CC LL (SYRINGE) ×2 IMPLANT
SYR 30ML LL (SYRINGE) ×2 IMPLANT
SYR MEDRAD MARK V 150ML (SYRINGE) IMPLANT
TAPE UMBILICAL COTTON 1/8X30 (MISCELLANEOUS) IMPLANT
TOWEL GREEN STERILE (TOWEL DISPOSABLE) ×2 IMPLANT
TRAY FOLEY MTR SLVR 16FR STAT (SET/KITS/TRAYS/PACK) IMPLANT
TUBING EXTENTION W/L.L. (IV SETS) IMPLANT
TUBING HIGH PRESSURE 120CM (CONNECTOR) IMPLANT
UNDERPAD 30X30 (UNDERPADS AND DIAPERS) ×2 IMPLANT
WATER STERILE IRR 1000ML POUR (IV SOLUTION) ×2 IMPLANT
WIRE BENTSON .035X145CM (WIRE) IMPLANT
WIRE TORQFLEX AUST .018X40CM (WIRE) ×2 IMPLANT

## 2017-09-17 NOTE — Progress Notes (Addendum)
PROGRESS NOTE    Mark [name]  JXB:[id number] DOB: [birth year] DOA: 09/15/2017 PCP: Fleet Contras, MD   Brief Narrative:57 y.o.malewith medical history significant forCOPD, hypertension,bipolar disorder, and a recent diagnosis in July 2018 of renal fibrosis, onImuran,to the emergency department by his father, after presenting with increasing lower extremity swelling over the last week, worse over the last 24 hours. History is obtained by his father, the patient is unable to provide detailed history due to psychiatric issues. His mother reports that yesterday, the patientdid his family, and the father noted that he was favoring his left lower extremity when walking, and the mother noted that his leg was severely swollen, with tense edema. The patient denies any worsening chest pain, pleuritic chest pain, nausea, vomiting, diaphoresis, hemoptysis. He denies any headaches or vision changes. He denies any abdominal pain, dysuria or gross hematuria. Of note, he was told by his caregiver at the group home, that he has a "growth in his abdomen on the left ". She has chronic neuropathy, but there are no worsening paresthesias. He is leg is significantly larger than the right. He denies any calf pain. The patient is somewhat compliant with his medications, at times forgetting to take them. He denies taking an aspirin a day. He denies any alcohol or recreational drug use. He does partake tobacco. No recent long distance trips. No recent surgeries. No prior history of PE or DVT. He is not on hormonal products. No family history of clotting disorders.   ED Course:BP (!) 142/98 (BP Location: Left Arm)  Pulse 92  Temp 98.1 F (36.7 C) (Oral)  Resp 20  Ht  (1.803 m)  Wt 91.6 kg (202 lb)  SpO2 98%  BMI 28.17 kg/m  Of note his last CT angiogram of the abdomen and pelvis in 2018 showedInflammatory thickening around the infrarenal abdominal and common iliac arteries.  This is consistent with chronic periaortitis which is a spectrum of disease that includes idiopathic retroperitoneal fibrosis and isolated periaortitis as well as aneurysmal types. 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 Idiopathic retroperitoneal fibrosis usually involves other retroperitoneal structures as well, including the IVC and ureters. Sodium 141, potassium 4.2. Bicarb 21. Glucose 135. Creatinine 1.34, with essentially normal GFR. Troponin negative Count 13.2 hemoglobin 14.7 platelets 160. Preliminary vascular studies show positive DVT for acute DVT involving the left entire left lower extremity, and the distal external iliac vein, ileum vein and IVC were not clearly visualized. PT 12.7, INR 0.96. She was placed on heparin infusion by pharmacy.     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 CT angiogram of the chest and abdomen and pelvis  No evidence of aortic dissection.  Abnormal soft tissue surrounding mid and distal abdominal aorta extending into the proximal common iliac arteries, appears fairly concentric, question aortic wall thickening such as from a large vessel vasculitis though this could also be seen with retroperitoneal fibrosis; the smooth concentric nature makes this less likely to represent thrombus.  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  Recommend annual imaging followup by CTA or MRA. This recommendation follows 2010  ACCF/AHA/AATS/ACR/ASA/SCA/SCAI/SIR/STS/SVM Guidelines for the Diagnosis and Management of Patients with Thoracic Aortic Disease. Circulation. 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   Dilated LEFT common and external iliac veins extending into LEFT common femoral vein with surrounding soft tissue edema and associated significant edema of the proximal LEFT lower extremity highly suspicious for deep venous thrombosiBILATERAL renal cysts.  Mild RIGHT hydronephrosis and proximal hydroureter with 2 mm mid RIGHT ureteral calculuss; 2Decho to rule out heart strain  Continue heparin per pharmacy  thrombectomy today. Leukocytosis, likely reactivedue to above, steroids, COPD, WBC 13   History of renal fibrosis, the patient is onImuran, Prednisone followed by Rheumatology at [facility] Follow as OP with specialty   Bipolar disorder  Continue Klonopin, Clozaril, Depakote Peripheral neuropathy, continue Neurontin  GERD,no acute symptoms Continue PPI   HypertensionBP 142/98  Pulse 92  Continue home anti-hypertensive medications  COPD without exacerbationOsats normalWBC 13 (likely reactive and pt on prednisone) Continue nebs  ckdstage 2 continue slow ivf      DVT prophylaxis: heparin Code Status:full Family Communication:none Disposition Plan:tbd Consultants: vascular  Procedures:none Antimicrobialsnone  Subjective: Co left leg pain  Objective: Vitals:   09/16/17 1506 09/16/17 1951 09/17/17 0019 09/17/17 0614  BP: 103/66 128/74 124/84 (!) 144/94  Pulse: 98 100 95 99  Resp: 20 20 18 14   Temp: 98.4 F (36.9 C) 98 F (36.7 C) 98.4 F (36.9 C) 98.2 F (36.8 C)  TempSrc: Oral Oral Oral Oral  SpO2: 96% 99% 99% 97%  Weight: 94.3 kg (208 lb)   94.8 kg (209 lb 1.6 oz)  Height: 5\' 11"  (1.803 m)       Intake/Output Summary (Last 24 hours) at 09/17/2017 1353 Last data  filed at 09/17/2017 1200 Gross per 24 hour  Intake 2269.5 ml  Output 2350 ml  Net -80.5 ml   Filed Weights   09/15/17 1003 09/16/17 1506 09/17/17 0614  Weight: 91.6 kg (202 lb) 94.3 kg (208 lb) 94.8 kg (209 lb 1.6 oz)    Examination:  General exam: Appears calm and comfortable  Respiratory system: Clear to auscultation. Respiratory effort normal. Cardiovascular system: S1 & S2 heard, RRR. No JVD, murmurs, rubs, gallops or clicks. No pedal edema. Gastrointestinal system: Abdomen is nondistended, soft and nontender. No organomegaly or masses felt. Normal bowel sounds heard. Central nervous system: Alert and oriented. No focal neurological deficits. Extremities: 3plus edema left leg no edemaright leg Skin: No rashes, lesions or ulcers Psychiatry: Judgement and insight appear normal. Mood & affect appropriate.     Data Reviewed: I have personally reviewed following labs and imaging studies  CBC: Recent Labs  Lab 09/15/17 1117 09/15/17 2151 09/16/17 0645 09/17/17 0455  WBC 13.2* 13.1* 12.7* 12.5*  NEUTROABS  --  10.3*  --   --   HGB 14.7 15.3 13.7 14.4  HCT 46.7 46.9 43.1 45.5  MCV 90.3 89.7 89.8 90.1  PLT 160 161 146* 164   Basic Metabolic Panel: Recent Labs  Lab 09/15/17 1117 09/16/17 0645  NA 141 144  K 4.2 4.0  CL 106 110  CO2 21* 26  GLUCOSE 135* 89  BUN 12 10  CREATININE 1.34* 1.03  CALCIUM 9.3 8.5*   GFR: Estimated Creatinine Clearance: 95.2 mL/min (by C-G formula based on SCr of 1.03 mg/dL). Liver Function Tests: No results for input(s): AST, ALT, ALKPHOS, BILITOT, PROT, ALBUMIN in the last 168  hours. No results for input(s): LIPASE, AMYLASE in the last 168 hours. No results for input(s): AMMONIA in the last 168 hours. Coagulation Profile: Recent Labs  Lab 09/15/17 1440 09/16/17 0645  INR 0.96 1.13   Cardiac Enzymes: No results for input(s): CKTOTAL, CKMB, CKMBINDEX, TROPONINI in the last 168 hours. BNP (last 3 results) No results for input(s):  PROBNP in the last 8760 hours. HbA1C: No results for input(s): HGBA1C in the last 72 hours. CBG: No results for input(s): GLUCAP in the last 168 hours. Lipid Profile: No results for input(s): CHOL, HDL, LDLCALC, TRIG, CHOLHDL, LDLDIRECT in the last 72 hours. Thyroid Function Tests: No results for input(s): TSH, T4TOTAL, FREET4, T3FREE, THYROIDAB in the last 72 hours. Anemia Panel: No results for input(s): VITAMINB12, FOLATE, FERRITIN, TIBC, IRON, RETICCTPCT in the last 72 hours. Sepsis Labs: No results for input(s): PROCALCITON, LATICACIDVEN in the last 168 hours.  No results found for this or any previous visit (from the past 240 hour(s)).       Radiology Studies: Ct Angio Chest Pe W And/or Wo Contrast  Result Date: 09/15/2017 CLINICAL DATA:  LEFT lower extremity swelling, growth of abdomen, COPD, hypertension, smoker EXAM: CT ANGIOGRAPHY CHEST, ABDOMEN AND PELVIS TECHNIQUE: Multidetector CT imaging through the chest, abdomen and pelvis was performed using the standard protocol during bolus administration of intravenous contrast. Multiplanar reconstructed images and MIPs were obtained and reviewed to evaluate the vascular anatomy. CONTRAST:  ISOVUE-370 IOPAMIDOL (ISOVUE-370) INJECTION 76% IV. No oral contrast. COMPARISON:  None FINDINGS: CTA CHEST FINDINGS Cardiovascular: Mild aneurysmal dilatation of the ascending thoracic aorta 4.0 cm transverse image 53. No aortic dissection. No pericardial effusion. Pulmonary arteries well opacified and patent. No evidence of pulmonary embolism. Mediastinum/Nodes: Scattered air within esophagus without gross wall thickening. Base of cervical region normal appearance. No thoracic adenopathy. Lungs/Pleura: Dependent atelectasis in the posterior lungs bilaterally. No acute infiltrate, pleural effusion or pneumothorax. Musculoskeletal: Osseous structures unremarkable. Review of the MIP images confirms the above findings. CTA ABDOMEN AND PELVIS FINDINGS  VASCULAR Aorta: Normal caliber without aneurysm or dissection. Abnormal soft tissue density surrounding the contrast opacified aortic lumen, favor aortic wall thickening over thrombus but retroperitoneal fibrosis not completely excluded. Question of vasculitis is raised. 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 Vessel otherwise patent. SMA: Widely patent Renals: Patent. Single LEFT renal artery. On RIGHT a main renal artery and a small accessory renal artery are noted. IMA: Patent Inflow: Widely patent, normal appearance Veins: Portal, superior mesenteric and splenic veins patent. IVC appears decompressed. Dilated LEFT common iliac and external iliac veins extending into LEFT common femoral vein with surrounding infiltrative changes and impaired enhancement on delayed images, highly suspicious for deep venous thrombosis. Review of the MIP images confirms the above findings. NON-VASCULAR Hepatobiliary: Gallbladder and liver normal appearance Pancreas: Normal appearance Spleen: Normal appearance Adrenals/Urinary Tract: BILATERAL renal cysts. Mild RIGHT hydronephrosis and hydroureter. 2 mm mid RIGHT ureteral calculus image 148. No LEFT side hydronephrosis or ureteral dilatation. Bladder is mildly distended, slightly asymmetric into the upper RIGHT pelvis without obvious mass or wall thickening. Stomach/Bowel: Upper normal caliber appendix without inflammation or wall thickening. Stomach and bowel loops otherwise unremarkable. Lymphatic: No adenopathy Reproductive: Unremarkable prostate gland and seminal vesicles Other: Associated retroperitoneal edema adjacent to the LEFT common and external iliac veins extent. Significant soft tissue edema including both intermuscular and intramuscular edema at the proximal LEFT lower extremity. No free intraperitoneal air or fluid. Small  umbilical hernia containing fat. Musculoskeletal: Retrolisthesis at L5-S1 with  disc degeneration, vacuum phenomenon, and mild bulging disc. Narrowing of spinal canal at L4-L5 with question broad-based disc herniation versus bulge. Review of the MIP images confirms the above findings. IMPRESSION: No evidence of aortic dissection. Abnormal soft tissue surrounding mid and distal abdominal aorta extending into the proximal common iliac arteries, appears fairly concentric, question aortic wall thickening such as from a large vessel vasculitis though this could also be seen with retroperitoneal fibrosis; the smooth concentric nature makes this less likely to represent thrombus. 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 Recommend annual imaging followup by CTA or MRA. This recommendation follows 2010 ACCF/AHA/AATS/ACR/ASA/SCA/SCAI/SIR/STS/SVM Guidelines for the Diagnosis and Management of Patients with Thoracic Aortic Disease. Circulation. 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 Dilated LEFT common and external iliac veins extending into LEFT common femoral vein with surrounding soft tissue edema and associated significant edema of the proximal LEFT lower extremity highly suspicious for deep venous thrombosis; venous duplex imaging recommended. BILATERAL renal cysts. Mild RIGHT hydronephrosis and proximal hydroureter with 2 mm mid RIGHT ureteral calculus. Findings called to Dr. Scotty Court on 09/15/2017 at 1920 hours. Electronically Signed   By: Ulyses Southward M.D.   On: 09/15/2017 19:23   Ct Angio Abd/pel W/ And/or W/o  Result Date: 09/15/2017 CLINICAL DATA:  LEFT lower extremity swelling, growth of abdomen, COPD, hypertension, smoker EXAM: CT ANGIOGRAPHY CHEST, ABDOMEN AND PELVIS TECHNIQUE: Multidetector CT imaging through the chest, abdomen and pelvis was performed using the standard protocol during bolus administration of intravenous contrast. Multiplanar reconstructed images and  MIPs were obtained and reviewed to evaluate the vascular anatomy. CONTRAST:  ISOVUE-370 IOPAMIDOL (ISOVUE-370) INJECTION 76% IV. No oral contrast. COMPARISON:  None FINDINGS: CTA CHEST FINDINGS Cardiovascular: Mild aneurysmal dilatation of the ascending thoracic aorta 4.0 cm transverse image 53. No aortic dissection. No pericardial effusion. Pulmonary arteries well opacified and patent. No evidence of pulmonary embolism. Mediastinum/Nodes: Scattered air within esophagus without gross wall thickening. Base of cervical region normal appearance. No thoracic adenopathy. Lungs/Pleura: Dependent atelectasis in the posterior lungs bilaterally. No acute infiltrate, pleural effusion or pneumothorax. Musculoskeletal: Osseous structures unremarkable. Review of the MIP images confirms the above findings. CTA ABDOMEN AND PELVIS FINDINGS VASCULAR Aorta: Normal caliber without aneurysm or dissection. Abnormal soft tissue density surrounding the contrast opacified aortic lumen, favor aortic wall thickening over thrombus but retroperitoneal fibrosis not completely excluded. Question of vasculitis is raised. 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 Vessel otherwise patent. SMA: Widely patent Renals: Patent. Single LEFT renal artery. On RIGHT a main renal artery and a small accessory renal artery are noted. IMA: Patent Inflow: Widely patent, normal appearance Veins: Portal, superior mesenteric and splenic veins patent. IVC appears decompressed. Dilated LEFT common iliac and external iliac veins extending into LEFT common femoral vein with surrounding infiltrative changes and impaired enhancement on delayed images, highly suspicious for deep venous thrombosis. Review of the MIP images confirms the above findings. NON-VASCULAR Hepatobiliary: Gallbladder and liver normal appearance Pancreas: Normal appearance Spleen: Normal appearance Adrenals/Urinary Tract:  BILATERAL renal cysts. Mild RIGHT hydronephrosis and hydroureter. 2 mm mid RIGHT ureteral calculus image 148. No LEFT side hydronephrosis or ureteral dilatation. Bladder is mildly distended, slightly asymmetric into the upper RIGHT pelvis without obvious mass or wall thickening. Stomach/Bowel: Upper normal caliber appendix without inflammation or wall thickening. Stomach and bowel  loops otherwise unremarkable. Lymphatic: No adenopathy Reproductive: Unremarkable prostate gland and seminal vesicles Other: Associated retroperitoneal edema adjacent to the LEFT common and external iliac veins extent. Significant soft tissue edema including both intermuscular and intramuscular edema at the proximal LEFT lower extremity. No free intraperitoneal air or fluid. Small umbilical hernia containing fat. Musculoskeletal: Retrolisthesis at L5-S1 with disc degeneration, vacuum phenomenon, and mild bulging disc. Narrowing of spinal canal at L4-L5 with question broad-based disc herniation versus bulge. Review of the MIP images confirms the above findings. IMPRESSION: No evidence of aortic dissection. Abnormal soft tissue surrounding mid and distal abdominal aorta extending into the proximal common iliac arteries, appears fairly concentric, question aortic wall thickening such as from a large vessel vasculitis though this could also be seen with retroperitoneal fibrosis; the smooth concentric nature makes this less likely to represent thrombus. 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 Recommend annual imaging followup by CTA or MRA. This recommendation follows 2010 ACCF/AHA/AATS/ACR/ASA/SCA/SCAI/SIR/STS/SVM Guidelines for the Diagnosis and Management of Patients with Thoracic Aortic Disease. Circulation. 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 Dilated LEFT common and external iliac veins extending into LEFT common femoral  vein with surrounding soft tissue edema and associated significant edema of the proximal LEFT lower extremity highly suspicious for deep venous thrombosis; venous duplex imaging recommended. BILATERAL renal cysts. Mild RIGHT hydronephrosis and proximal hydroureter with 2 mm mid RIGHT ureteral calculus. Findings called to Dr. Scotty Court on 09/15/2017 at 1920 hours. Electronically Signed   By: Ulyses Southward M.D.   On: 09/15/2017 19:23        Scheduled Meds: . [MAR Hold] atropine  2 drop Sublingual Daily  . [MAR Hold] clonazePAM  1 mg Oral QHS  . [MAR Hold] cloZAPine  300 mg Oral BID  . [MAR Hold] diclofenac sodium  2 g Topical QID  . [MAR Hold] divalproex  500 mg Oral QHS  . [MAR Hold] gabapentin  100 mg Oral BID  . [MAR Hold] pantoprazole  40 mg Oral Daily  . [MAR Hold] predniSONE  10 mg Oral Daily  . [MAR Hold] propranolol  10 mg Oral Daily   Continuous Infusions: . sodium chloride 75 mL/hr (09/16/17 2004)  . heparin 1,500 Units/hr (09/16/17 2220)  . lactated ringers 50 mL/hr at 09/17/17 1234     LOS: 2 days     Alwyn Ren, MD [facility] If 7PM-7AM, please contact night-coverage [email or web address] Password [email or web address] 09/17/2017, 1:53 PM

## 2017-09-17 NOTE — Transfer of Care (Signed)
Immediate Anesthesia Transfer of Care Note  Patient: Mark [name]  Procedure(s) Performed: ULTRASOUND POPLITEAL ACCESS; CENTRAL VENOGRAM, IVVS, LYSIS CATHETER PLACEMENT (Left ) POSSIBLE THROMBECTOMY (Left )  Patient Location: PACU  Anesthesia Type:General  Level of Consciousness: awake  Airway & Oxygen Therapy: Patient Spontanous Breathing and Patient connected to nasal cannula oxygen  Post-op Assessment: Report given to RN and Post -op Vital signs reviewed and stable  Post vital signs: Reviewed and stable  Last Vitals:  Vitals Value Taken Time  BP    Temp    Pulse    Resp    SpO2      Last Pain:  Vitals:   09/17/17 1030  TempSrc:   PainSc: 0-No pain         Complications: No apparent anesthesia complications

## 2017-09-17 NOTE — Op Note (Signed)
    Patient name: Mark [name] MRN: [id number] DOB: 10-29-1960 Sex: male  09/17/2017 Pre-operative Diagnosis: extensive left lower extremity DVT Post-operative diagnosis:  Same Surgeon:  Luanna Salk. Randie Heinz, MD Procedure Performed: 1.  Ultrasound-guided cannulation left small saphenous vein 2.  Central venogram 3.  Intravascular ultrasound of IVC, left common iliac vein, left external iliac vein, left common femoral vein, left femoral vein, left popliteal vein 4.  Placement of 50 cm treatment length UniFuse catheter from IVC to mid femoral vein  Indications: 57 year old male without history of DVT presents with extensive left lower extremity edema.  He is relatively free of pain and has full use of his left foot.  He is occluded from at least his left external iliac vein to the level of his popliteal vein.  He is now indicated for venogram with intravascular ultrasound and possible intervention.  Findings: The IVC is patent at the level of the renal veins.  By intravascular ultrasound there appears to be compression at the level of the confluence of the IVC at the beginning of the left common iliac vein.  This appears to be chronic the thrombosed at the level and has a very narrow lumen that could not be measured.  Veins distal to the common iliac vein are all dilated with acute appearing thrombus from the external iliac vein distally to the level of the popliteal.  A 50 cm treatment UniFuse catheter was left from the IVC to the mid femoral vein on the left.   Procedure:  The patient was identified in the holding area and taken to the operating room where he was placed supine on the operating table and general anesthesia was induced.  He was then flipped prone.  He was sterilely prepped and draped in his bilateral popliteal fossae given antibiotics and a timeout was called.  We began with ultrasound-guided cannulation of the left small saphenous vein which was patent although minimally compressible.   A micropuncture needle was placed followed by micropuncture wire.  We then placed a stiff Glidewire into the common femoral vein and used fluoroscopy to guide this.  A 5 French sheath was then placed.  We used a Berenstein catheter to get the Glidewire centrally and then used a quick cross catheter to exchanged for an Amplatz wire into the terminal IVC.  We then exchanged for an 8 [place] sheath.  This time the patient was given an additional 5000 units of heparin and he was already on a heparin drip.  We performed intravascular ultrasound with the above findings.  We then exchanged for a UniFuse catheter 50 cm treatment length from the IVC to the mid femoral vein on the left.  The sheath was then affixed to the leg with a 2-0 silk suture.  Sheath and catheter were flushed.  Patient tolerated procedure well without immediate complication.  EBL 10 cc.  Lord Lancour C. Randie Heinz, MD [facility] Office: [phone] Pager: [phone]

## 2017-09-17 NOTE — Progress Notes (Signed)
Arrived from PACU, right lower leg sheath. Bleeding around site. Leg elevated on pillow. Started TPA via blue port and heparin via intro, NS at 150 peripherally. Patient somewhat somnolent but will respond to questions, knows where he is. pulses palpable in lower extremities. Left leg swollen. Voided 900 ml clear yellow urine. Instructed patient not to bend/move left leg. Father at bedside updated. Dr Randie Heinz aware father is here, states will round in a little while.

## 2017-09-17 NOTE — Progress Notes (Signed)
ANTICOAGULATION CONSULT NOTE - Follow Up Consult  Pharmacy Consult for heparin Indication: DVT  No Known Allergies  Patient Measurements: Height:  (180.3 cm) Weight: 209 lb 1.6 oz (94.8 kg) IBW/kg (Calculated) : 75.3 Heparin Dosing Weight: 91 kg  Vital Signs: Temp: 98.2 F (36.8 C) (05/12 0614) Temp Source: Oral (05/12 0614) BP: 144/94 (05/12 0614) Pulse Rate: 99 (05/12 0614)  Labs: Recent Labs    09/15/17 1117 09/15/17 1440 09/15/17 2151 09/15/17 2339 09/16/17 0645 09/17/17 0455  HGB 14.7  --  15.3  --  13.7 14.4  HCT 46.7  --  46.9  --  43.1 45.5  PLT 160  --  161  --  146* 164  APTT  --   --   --   --  100*  --   LABPROT  --  12.7  --   --  14.4  --   INR  --  0.96  --   --  1.13  --   HEPARINUNFRC  --   --   --  0.46 0.69 0.50  CREATININE 1.34*  --   --   --  1.03  --     Estimated Creatinine Clearance: 95.2 mL/min (by C-G formula based on SCr of 1.03 mg/dL).   Medications:  Scheduled:  . atropine  2 drop Sublingual Daily  . clonazePAM  1 mg Oral QHS  . cloZAPine  300 mg Oral BID  . diclofenac sodium  2 g Topical QID  . divalproex  500 mg Oral QHS  . gabapentin  100 mg Oral BID  . pantoprazole  40 mg Oral Daily  . predniSONE  10 mg Oral Daily  . propranolol  10 mg Oral Daily   Infusions:  . sodium chloride 75 mL/hr (09/16/17 2004)  . heparin 1,500 Units/hr (09/16/17 2220)    Assessment: 56 YOM with new acute LLE DVT continues on IV heparin. VVS planning 2 stage thrombolysis procedure, possibly today. Heparin level is at goal 0.5, CBC is stable, no bleeding noted.  Goal of Therapy:  Heparin level 0.3-0.7 units/ml Monitor platelets by anticoagulation protocol: Yes   Plan:  Continue heparin at 1500 units/hr Daily heparin level and CBC while on heparin Follow [facility]VS plans - possible thrombolysis today  [name], PharmD PGY1 Pharmacy Resident Phone: [phone] After 3:30PM please call Main Pharmacy [phone] 09/17/2017,9:05 AM

## 2017-09-17 NOTE — Anesthesia Procedure Notes (Signed)
Procedure Name: Intubation Date/Time: 09/17/2017 3:02 PM Performed by: Clearnce Sorrel, CRNA Pre-anesthesia Checklist: Patient identified, Emergency Drugs available, Suction available, Patient being monitored and Timeout performed Patient Re-evaluated:Patient Re-evaluated prior to induction Oxygen Delivery Method: Circle system utilized Preoxygenation: Pre-oxygenation with 100% oxygen Induction Type: IV induction Ventilation: Mask ventilation without difficulty Laryngoscope Size: Mac and 4 Grade View: Grade I Tube type: Oral Tube size: 7.5 mm Number of attempts: 1 Airway Equipment and Method: Stylet Placement Confirmation: ETT inserted through vocal cords under direct vision,  positive ETCO2 and breath sounds checked- equal and bilateral Secured at: 23 cm Tube secured with: Tape Dental Injury: Teeth and Oropharynx as per pre-operative assessment

## 2017-09-17 NOTE — Anesthesia Preprocedure Evaluation (Addendum)
Anesthesia Evaluation  Patient identified by MRN, date of birth, ID band Patient awake    Reviewed: Allergy & Precautions, NPO status , Patient's Chart, lab work & pertinent test results, reviewed documented beta blocker date and time   Airway Mallampati: II  TM Distance: >3 FB Neck ROM: Full    Dental  (+) Dental Advisory Given, Poor Dentition, Missing   Pulmonary COPD, Current Smoker,    Pulmonary exam normal breath sounds clear to auscultation       Cardiovascular hypertension, Pt. on home beta blockers + Peripheral Vascular Disease (extensive left lower extremity dvt)  Normal cardiovascular exam Rhythm:Regular Rate:Normal     Neuro/Psych PSYCHIATRIC DISORDERS Bipolar Disorder Schizophrenia negative neurological ROS     GI/Hepatic Neg liver ROS, GERD  Medicated,  Endo/Other  negative endocrine ROS  Renal/GU negative Renal ROS     Musculoskeletal negative musculoskeletal ROS (+)   Abdominal   Peds  Hematology negative hematology ROS (+)   Anesthesia Other Findings Day of surgery medications reviewed with the patient.  Reproductive/Obstetrics                            Anesthesia Physical Anesthesia Plan  ASA: III  Anesthesia Plan: General   Post-op Pain Management:    Induction: Intravenous  PONV Risk Score and Plan: 1 and Dexamethasone and Ondansetron  Airway Management Planned: Oral ETT  Additional Equipment:   Intra-op Plan:   Post-operative Plan: Extubation in OR  Informed Consent: I have reviewed the patients History and Physical, chart, labs and discussed the procedure including the risks, benefits and alternatives for the proposed anesthesia with the patient or authorized representative who has indicated his/her understanding and acceptance.   Dental advisory given  Plan Discussed with: CRNA  Anesthesia Plan Comments:       Anesthesia Quick Evaluation

## 2017-09-17 NOTE — Progress Notes (Signed)
  Progress Note    09/17/2017 2:47 PM Day of Surgery  Subjective: Left leg less swollen today.  Vitals:   09/17/17 0019 09/17/17 0614  BP: 124/84 (!) 144/94  Pulse: 95 99  Resp: 18 14  Temp: 98.4 F (36.9 C) 98.2 F (36.8 C)  SpO2: 99% 97%    Physical Exam: Awake and alert Nonlabored respirations Abdomen is soft Left leg is edematous but improved and nonpitting Left DP is palpable  CBC    Component Value Date/Time   WBC 12.5 (H) 09/17/2017 0455   RBC 5.05 09/17/2017 0455   HGB 14.4 09/17/2017 0455   HCT 45.5 09/17/2017 0455   PLT 164 09/17/2017 0455   MCV 90.1 09/17/2017 0455   MCH 28.5 09/17/2017 0455   MCHC 31.6 09/17/2017 0455   RDW 16.9 (H) 09/17/2017 0455   LYMPHSABS 2.0 09/15/2017 2151   MONOABS 0.8 09/15/2017 2151   EOSABS 0.0 09/15/2017 2151   BASOSABS 0.0 09/15/2017 2151    BMET    Component Value Date/Time   NA 144 09/16/2017 0645   K 4.0 09/16/2017 0645   CL 110 09/16/2017 0645   CO2 26 09/16/2017 0645   GLUCOSE 89 09/16/2017 0645   BUN 10 09/16/2017 0645   CREATININE 1.03 09/16/2017 0645   CALCIUM 8.5 (L) 09/16/2017 0645   GFRNONAA >60 09/16/2017 0645   GFRAA >60 09/16/2017 0645    INR    Component Value Date/Time   INR 1.13 09/16/2017 0645     Intake/Output Summary (Last 24 hours) at 09/17/2017 1447 Last data filed at 09/17/2017 1200 Gross per 24 hour  Intake 2269.5 ml  Output 2350 ml  Net -80.5 ml     Assessment:  57 y.o. male is here with extensive left lower extremity DVT.  Plan: Left lower extremity venogram with IVIS and possible lytic catheter placement.  I discussed this with his mother via the telephone as well as with the patient and they understand the risk benefits and are willing to proceed.    Mark [name] C. Mark Heinz, MD [facility] Office: [phone] Pager: [phone]  09/17/2017 2:47 PM

## 2017-09-17 NOTE — Anesthesia Postprocedure Evaluation (Signed)
Anesthesia Post Note  Patient: Mark [name]  Procedure(s) Performed: ULTRASOUND POPLITEAL ACCESS; CENTRAL VENOGRAM, IVVS, LYSIS CATHETER PLACEMENT (Left ) POSSIBLE THROMBECTOMY (Left )     Patient location during evaluation: PACU Anesthesia Type: General Level of consciousness: awake and alert Pain management: pain level controlled Vital Signs Assessment: post-procedure vital signs reviewed and stable Respiratory status: spontaneous breathing, nonlabored ventilation and respiratory function stable Cardiovascular status: blood pressure returned to baseline and stable Postop Assessment: no apparent nausea or vomiting Anesthetic complications: no    Last Vitals:  Vitals:   09/17/17 2130 09/17/17 2145  BP: (!) 158/104 (!) 168/113  Pulse: (!) 105 (!) 115  Resp: 17 (!) 22  Temp:    SpO2: 97% 95%    Last Pain:  Vitals:   09/17/17 2145  TempSrc:   PainSc: 8                  Cecile Hearing

## 2017-09-18 ENCOUNTER — Encounter (HOSPITAL_COMMUNITY): Payer: Self-pay | Admitting: Vascular Surgery

## 2017-09-18 ENCOUNTER — Inpatient Hospital Stay (HOSPITAL_COMMUNITY): Payer: Medicaid Other

## 2017-09-18 ENCOUNTER — Encounter (HOSPITAL_COMMUNITY)
Admission: EM | Disposition: A | Discharge status: Home / Self Care | Payer: Self-pay | Source: Home / Self Care | Attending: Internal Medicine

## 2017-09-18 HISTORY — PX: PERIPHERAL VASCULAR INTERVENTION: CATH118257

## 2017-09-18 HISTORY — PX: LOWER EXTREMITY ANGIOGRAPHY: CATH118251

## 2017-09-18 LAB — CBC WITH DIFFERENTIAL/PLATELET
Basophils Absolute: 0 10*3/uL (ref 0.0–0.1)
Basophils Relative: 0 %
EOS ABS: 0 10*3/uL (ref 0.0–0.7)
EOS PCT: 0 %
HCT: 44 % (ref 39.0–52.0)
Hemoglobin: 14 g/dL (ref 13.0–17.0)
LYMPHS ABS: 2.8 10*3/uL (ref 0.7–4.0)
Lymphocytes Relative: 18 %
MCH: 28.5 pg (ref 26.0–34.0)
MCHC: 31.8 g/dL (ref 30.0–36.0)
MCV: 89.6 fL (ref 78.0–100.0)
MONO ABS: 0.8 10*3/uL (ref 0.1–1.0)
Monocytes Relative: 5 %
Neutro Abs: 12.2 10*3/uL — ABNORMAL HIGH (ref 1.7–7.7)
Neutrophils Relative %: 77 %
PLATELETS: 122 10*3/uL — AB (ref 150–400)
RBC: 4.91 MIL/uL (ref 4.22–5.81)
RDW: 16.7 % — AB (ref 11.5–15.5)
WBC: 15.8 10*3/uL — AB (ref 4.0–10.5)

## 2017-09-18 LAB — CBC
HCT: 43.7 % (ref 39.0–52.0)
HEMOGLOBIN: 14.1 g/dL (ref 13.0–17.0)
MCH: 29.2 pg (ref 26.0–34.0)
MCHC: 32.3 g/dL (ref 30.0–36.0)
MCV: 90.5 fL (ref 78.0–100.0)
PLATELETS: 96 10*3/uL — AB (ref 150–400)
RBC: 4.83 MIL/uL (ref 4.22–5.81)
RDW: 17.1 % — AB (ref 11.5–15.5)
WBC: 12.5 10*3/uL — ABNORMAL HIGH (ref 4.0–10.5)

## 2017-09-18 LAB — FIBRINOGEN
FIBRINOGEN: 231 mg/dL (ref 210–475)
Fibrinogen: 340 mg/dL (ref 210–475)

## 2017-09-18 LAB — BASIC METABOLIC PANEL
Anion gap: 8 (ref 5–15)
BUN: 9 mg/dL (ref 6–20)
CALCIUM: 8.9 mg/dL (ref 8.9–10.3)
CO2: 23 mmol/L (ref 22–32)
CREATININE: 1.35 mg/dL — AB (ref 0.61–1.24)
Chloride: 110 mmol/L (ref 101–111)
GFR calc non Af Amer: 58 mL/min — ABNORMAL LOW (ref >60)
Glucose, Bld: 127 mg/dL — ABNORMAL HIGH (ref 65–99)
Potassium: 4.1 mmol/L (ref 3.5–5.1)
SODIUM: 141 mmol/L (ref 135–145)

## 2017-09-18 LAB — HEPARIN LEVEL (UNFRACTIONATED): Heparin Unfractionated: <0.1 IU/mL — ABNORMAL LOW (ref 0.30–0.70)

## 2017-09-18 SURGERY — LOWER EXTREMITY ANGIOGRAPHY
Anesthesia: LOCAL

## 2017-09-18 MED ORDER — HEPARIN SODIUM (PORCINE) 1000 UNIT/ML IJ SOLN
INTRAMUSCULAR | Status: AC
  Filled 2017-09-18: qty 1

## 2017-09-18 MED ORDER — FENTANYL CITRATE (PF) 100 MCG/2ML IJ SOLN
INTRAMUSCULAR | Status: AC
  Filled 2017-09-18: qty 2

## 2017-09-18 MED ORDER — HEPARIN (PORCINE) IN NACL 1000-0.9 UT/500ML-% IV SOLN
INTRAVENOUS | Status: AC
  Filled 2017-09-18: qty 500

## 2017-09-18 MED ORDER — HEPARIN (PORCINE) IN NACL 2-0.9 UNITS/ML
INTRAMUSCULAR | Status: AC | PRN
  Administered 2017-09-18: 500 mL

## 2017-09-18 MED ORDER — SODIUM CHLORIDE 0.9% FLUSH
3.0000 mL | Freq: Two times a day (BID) | INTRAVENOUS | Status: DC
  Administered 2017-09-18: 3 mL via INTRAVENOUS

## 2017-09-18 MED ORDER — ONDANSETRON HCL 4 MG/2ML IJ SOLN: 4.0000 mg | Freq: Four times a day (QID) | INTRAMUSCULAR | Status: DC | PRN

## 2017-09-18 MED ORDER — SODIUM CHLORIDE 0.9% FLUSH: 3.0000 mL | INTRAVENOUS | Status: DC | PRN

## 2017-09-18 MED ORDER — FENTANYL CITRATE (PF) 100 MCG/2ML IJ SOLN
INTRAMUSCULAR | Status: DC | PRN
  Administered 2017-09-18 (×3): 50 ug via INTRAVENOUS

## 2017-09-18 MED ORDER — HEPARIN (PORCINE) IN NACL 100-0.45 UNIT/ML-% IJ SOLN: 800.0000 [IU]/h | INTRAMUSCULAR | Status: DC

## 2017-09-18 MED ORDER — HYDRALAZINE HCL 20 MG/ML IJ SOLN: 5.0000 mg | INTRAMUSCULAR | Status: DC | PRN

## 2017-09-18 MED ORDER — SODIUM CHLORIDE 0.9 % IV SOLN: INTRAVENOUS | Status: AC

## 2017-09-18 MED ORDER — SODIUM CHLORIDE 0.9 % IV SOLN
250.0000 mL | INTRAVENOUS | Status: DC | PRN
Start: 2017-09-18 — End: 2017-09-19

## 2017-09-18 MED ORDER — HEPARIN (PORCINE) IN NACL 100-0.45 UNIT/ML-% IJ SOLN
1250.0000 [IU]/h | INTRAMUSCULAR | Status: DC
  Administered 2017-09-18: 1000 [IU]/h via INTRAVENOUS
  Filled 2017-09-18 (×2): qty 250

## 2017-09-18 MED ORDER — LABETALOL HCL 5 MG/ML IV SOLN
10.0000 mg | INTRAVENOUS | Status: DC | PRN
  Administered 2017-09-18: 10 mg via INTRAVENOUS
  Filled 2017-09-18: qty 4

## 2017-09-18 MED ORDER — SODIUM CHLORIDE 0.9 % IV SOLN
1.0000 mg/h | INTRAVENOUS | Status: DC
  Administered 2017-09-18: 1 mg/h
  Filled 2017-09-18 (×2): qty 10

## 2017-09-18 MED ORDER — ACETAMINOPHEN 325 MG PO TABS: 650.0000 mg | ORAL_TABLET | ORAL | Status: DC | PRN

## 2017-09-18 MED ORDER — LIDOCAINE HCL (PF) 1 % IJ SOLN
INTRAMUSCULAR | Status: AC
  Filled 2017-09-18: qty 30

## 2017-09-18 MED ORDER — HEPARIN SODIUM (PORCINE) 1000 UNIT/ML IJ SOLN
INTRAMUSCULAR | Status: DC | PRN
  Administered 2017-09-18: 10000 [IU] via INTRAVENOUS

## 2017-09-18 MED ORDER — PROPRANOLOL HCL 10 MG PO TABS
10.0000 mg | ORAL_TABLET | Freq: Two times a day (BID) | ORAL | Status: DC
  Administered 2017-09-18 – 2017-09-19 (×2): 10 mg via ORAL
  Filled 2017-09-18 (×3): qty 1

## 2017-09-18 MED ORDER — ORAL CARE MOUTH RINSE
15.0000 mL | Freq: Two times a day (BID) | OROMUCOSAL | Status: DC
  Administered 2017-09-18 (×2): 15 mL via OROMUCOSAL

## 2017-09-18 MED ORDER — HEPARIN (PORCINE) IN NACL 100-0.45 UNIT/ML-% IJ SOLN: 1500.0000 [IU]/h | INTRAMUSCULAR | Status: DC

## 2017-09-18 MED ORDER — ASPIRIN EC 81 MG PO TBEC
81.0000 mg | DELAYED_RELEASE_TABLET | Freq: Every day | ORAL | Status: DC
  Administered 2017-09-19: 81 mg via ORAL
  Filled 2017-09-18: qty 1

## 2017-09-18 MED ORDER — LIDOCAINE HCL (PF) 1 % IJ SOLN
INTRAMUSCULAR | Status: DC | PRN
  Administered 2017-09-18: 10 mL

## 2017-09-18 MED ORDER — IODIXANOL 320 MG/ML IV SOLN
INTRAVENOUS | Status: DC | PRN
  Administered 2017-09-18: 35 mL via INTRAVENOUS

## 2017-09-18 MED ORDER — OXYCODONE HCL 5 MG PO TABS: 5.0000 mg | ORAL_TABLET | ORAL | Status: DC | PRN

## 2017-09-18 SURGICAL SUPPLY — 20 items
BAG SNAP BAND KOVER 36X36 (MISCELLANEOUS) ×3 IMPLANT
BALLN ATLAS 14X40X75 (BALLOONS) ×3
BALLN MUSTANG 10X80X75 (BALLOONS) ×3
BALLN MUSTANG 12X80X75 (BALLOONS) ×3
BALLOON ATLAS 14X40X75 (BALLOONS) ×2 IMPLANT
BALLOON MUSTANG 10X80X75 (BALLOONS) ×2 IMPLANT
BALLOON MUSTANG 12X80X75 (BALLOONS) ×2 IMPLANT
CATH ANGIO 5F BER 100CM (CATHETERS) ×3 IMPLANT
CATH RETRIEVER CLOT 16MMX105CM (CATHETERS) ×3 IMPLANT
CATH VISIONS PV .035 IVUS (CATHETERS) ×3 IMPLANT
COVER DOME SNAP 22 D (MISCELLANEOUS) ×3 IMPLANT
HEMOSTASIS PAD V PLUS (HEMOSTASIS) ×3 IMPLANT
KIT ENCORE 26 ADVANTAGE (KITS) ×3 IMPLANT
KIT PV (KITS) ×3 IMPLANT
SHEATH CLOT RETRIEVER (SHEATH) ×3 IMPLANT
STENT WALLSTENT 18X90X75 (Permanent Stent) ×6 IMPLANT
SYR MEDRAD MARK V 150ML (SYRINGE) ×3 IMPLANT
TRANSDUCER W/STOPCOCK (MISCELLANEOUS) ×3 IMPLANT
TRAY PV CATH (CUSTOM PROCEDURE TRAY) ×3 IMPLANT
WIRE AMPLATZ SS-J .035X260CM (WIRE) ×3 IMPLANT

## 2017-09-18 NOTE — Progress Notes (Signed)
ANTICOAGULATION CONSULT NOTE   Pharmacy Consult for Heparin Indication: DVT  No Known Allergies  Patient Measurements: Height:  (180.3 cm) Weight: 209 lb 10.5 oz (95.1 kg) IBW/kg (Calculated) : 75.3 Heparin Dosing Weight: 91 kg   Vital Signs: Temp: 98.6 F (37 C) (05/13 0342) Temp Source: Oral (05/13 0342) BP: 124/82 (05/13 0500) Pulse Rate: 119 (05/13 0500)  Labs: Recent Labs    09/15/17 1117 09/15/17 1440  09/16/17 0645 09/17/17 0455 09/17/17 1719 09/17/17 2228 09/18/17 0450  HGB 14.7  --    < > 13.7 14.4 14.6 15.0 14.0  HCT 46.7  --    < > 43.1 45.5 45.0 46.2 44.0  PLT 160  --    < > 146* 164 162 147* 122*  APTT  --   --   --  100*  --   --   --   --   LABPROT  --  12.7  --  14.4  --   --   --   --   INR  --  0.96  --  1.13  --   --   --   --   HEPARINUNFRC  --   --    < > 0.69 0.50 1.10* 0.20*  --   CREATININE 1.34*  --   --  1.03  --   --   --   --    < > = values in this interval not displayed.    Estimated Creatinine Clearance: 95.4 mL/min (by C-G formula based on SCr of 1.03 mg/dL).   Assessment: 57 y.o. male with DVT, alteplase via lysis catheter now stopped, for heparin  Goal of Therapy:  Heparin level 0.3-0.7 units/ml Monitor platelets by anticoagulation protocol: Yes   Plan:  Increase Heparin 1500 units/hr Check heparin level in 6 hours.  Geannie Risen, PharmD, BCPS  09/18/2017 5:25 AM

## 2017-09-18 NOTE — Progress Notes (Signed)
Patient provided bed bath as part of routine care. While working with patient and providing more stimulation patient woke up with increase pain with his left leg. Patient pupils remain uneven and unchanged. Advised by MD prior to hold off on sedating medications to be able to perform thorough neuro assessments; however, patient restless and moaning with increase left leg pain. Patients HR noted to continue to increase to 125-128. On call MD notified and updated. Orders obtained for STAT head CT to rule out any changes before treating patients pain. Advised to stop TPA infusion through sheath. Continue Heparin infusion. And initiate NS 49mL/her as replacement infusion to maintain sheath patency. Orders received and carried out. Patient transported for scan without complication. Patient noted to fall back asleep after having time to rest without pain medication. Phlebotomy at bedside to collect lab work. Will notified MD and provide update once blood work resulted.

## 2017-09-18 NOTE — Progress Notes (Signed)
  Progress Note    09/18/2017 7:41 AM 1 Day Post-Op  Subjective: He does not have any complaints at this time  Vitals:   09/18/17 0500 09/18/17 0600  BP: 124/82 118/81  Pulse: (!) 119 (!) 114  Resp: 16 16  Temp:    SpO2: 96% 94%    Physical Exam: He is awake and alert Nonlabored respirations He is moving all 4 extremities Abdomen is soft Left leg with stable edema that is nonpitting Palpable dorsalis pedis pulse on the left  CBC    Component Value Date/Time   WBC 15.8 (H) 09/18/2017 0450   RBC 4.91 09/18/2017 0450   HGB 14.0 09/18/2017 0450   HCT 44.0 09/18/2017 0450   PLT 122 (L) 09/18/2017 0450   MCV 89.6 09/18/2017 0450   MCH 28.5 09/18/2017 0450   MCHC 31.8 09/18/2017 0450   RDW 16.7 (H) 09/18/2017 0450   LYMPHSABS 2.8 09/18/2017 0450   MONOABS 0.8 09/18/2017 0450   EOSABS 0.0 09/18/2017 0450   BASOSABS 0.0 09/18/2017 0450    BMET    Component Value Date/Time   NA 144 09/16/2017 0645   K 4.0 09/16/2017 0645   CL 110 09/16/2017 0645   CO2 26 09/16/2017 0645   GLUCOSE 89 09/16/2017 0645   BUN 10 09/16/2017 0645   CREATININE 1.03 09/16/2017 0645   CALCIUM 8.5 (L) 09/16/2017 0645   GFRNONAA >60 09/16/2017 0645   GFRAA >60 09/16/2017 0645    INR    Component Value Date/Time   INR 1.13 09/16/2017 0645     Intake/Output Summary (Last 24 hours) at 09/18/2017 0741 Last data filed at 09/18/2017 0500 Gross per 24 hour  Intake 3228.33 ml  Output 3050 ml  Net 178.33 ml     Assessment:  57 y.o. male is status post lysis for extensive DVT of his left lower extremity.  Overnight he had change in his pupillary reaction and was confused underwent CT scan of his head which demonstrated no acute changes.  TPA has been restarted his leg remains edematous.  Plan: Continue TPA and fibrinogen checks.  N.p.o. for lysis recheck today.   Oneika Simonian C. Randie Heinz, MD [facility] [place] Office: [phone] Pager:  [phone]  09/18/2017 7:41 AM

## 2017-09-18 NOTE — Progress Notes (Signed)
ANTICOAGULATION CONSULT NOTE   Pharmacy Consult for Heparin Indication: DVT  No Known Allergies  Patient Measurements: Height:  (180.3 cm) Weight: 209 lb 10.5 oz (95.1 kg) IBW/kg (Calculated) : 75.3 Heparin Dosing Weight: 91 kg   Vital Signs: Temp: 99.4 F (37.4 C) (05/13 1153) Temp Source: Oral (05/13 1153) BP: 144/90 (05/13 1210) Pulse Rate: 96 (05/13 1210)  Labs: Recent Labs    09/16/17 0645  09/17/17 2228 09/18/17 0450 09/18/17 0957 09/18/17 1138  HGB 13.7   < > 15.0 14.0 14.1  --   HCT 43.1   < > 46.2 44.0 43.7  --   PLT 146*   < > 147* 122* 96*  --   APTT 100*  --   --   --   --   --   LABPROT 14.4  --   --   --   --   --   INR 1.13  --   --   --   --   --   HEPARINUNFRC 0.69   < > 0.20* <0.10*  --  <0.10*  CREATININE 1.03  --   --   --   --  1.35*   < > = values in this interval not displayed.    Estimated Creatinine Clearance: 72.8 mL/min (A) (by C-G formula based on SCr of 1.35 mg/dL (H)).   Assessment: 57 y.o. male with DVT, alteplase via lysis catheter now stopped, on IV heparin at 800 units/hr.  Heparin level remains undetectable.  Spoke to Dr. Randie Heinz - patient going to OR now for thrombectomy.    Now s/p thrombectomy with plans to resume heparin 2 hours post sheath removal at 1800 pm  Goal of Therapy:  Heparin level 0.3-0.7 units/ml Monitor platelets by anticoagulation protocol: Yes   Plan:  Heparin at 1000 units / hr starting at 1800 pm Daily heparin level, CBC  Thank you Okey Regal, PharmD [phone]  09/18/2017 4:02 PM

## 2017-09-18 NOTE — Progress Notes (Signed)
Patient noted with unequal pupil sizes this morning during routine assessment. Right Pupil 5 mm, Left Pupil 1mm. Patient drowsy with RASS -2 but able to wake up and follow commands. Patients ROM remains intact with all extremities. Patients vision remains unchanged. No eye deviations noted. Patient without complaints of headache or any nausea. On call MD notified and updated. No further orders received at this time.

## 2017-09-18 NOTE — Progress Notes (Signed)
PROGRESS NOTE    Mark [name]  ZOX:[id number] DOB: Jun 06, 1960 DOA: 09/15/2017 PCP: Fleet Contras, MD   Brief Narrative 57 y.o. 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  History is obtained by his father, the patient is  unable to provide detailed history due to psychiatric issues.  His mother reports that yesterday, the patient did his family, and the father noted that he was favoring his left lower extremity when walking, and the mother noted that his leg was severely swollen, with tense edema.  The patient denies any worsening chest pain, pleuritic chest pain, nausea, vomiting, diaphoresis, hemoptysis.  He denies any headaches or vision changes.  He denies any abdominal pain, dysuria or gross hematuria.  Of note, he was told by his caregiver at the group home, that he has a "growth in his abdomen on the left ".  She has chronic neuropathy, but there are no worsening paresthesias.  He is leg is significantly larger than the right.  He denies any calf pain.  The patient is somewhat compliant with his medications, at times forgetting to take them.  He denies taking an aspirin a day.  He denies any alcohol or recreational drug use.  He does partake tobacco.  No recent long distance trips.  No recent surgeries.  No prior history of PE or DVT.  He is not on hormonal products.  No family history of clotting disorders.     ED Course:  BP (!) 142/98 (BP Location: Left Arm)   Pulse 92   Temp 98.1 F (36.7 C) (Oral)   Resp 20   Ht  (1.803 m)   Wt 91.6 kg (202 lb)   SpO2 98%   BMI 28.17 kg/m   Of note his last CT angiogram of the abdomen and pelvis in 2018 showed  Inflammatory thickening around the infrarenal abdominal and common iliac  arteries. This is consistent with chronic periaortitis which is a spectrum of disease that includes idiopathic retroperitoneal fibrosis and isolated periaortitis as well as aneurysmal types. 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 Idiopathic retroperitoneal fibrosis usually involves other retroperitoneal structures as well, including the IVC and ureters. Sodium 141, potassium 4.2.  Bicarb 21.  Glucose 135.  Creatinine 1.34, with essentially normal GFR. Troponin negative Count 13.2 hemoglobin 14.7 platelets 160. Preliminary vascular studies show positive DVT for acute DVT involving the left entire left lower extremity, and the distal external iliac vein, ileum vein and IVC were not clearly visualized. PT 12.7, INR 0.96. She was placed on heparin infusion by pharmacy.  09/18/2017 overnight events noted patient had an unequal pupil sizes and confusion so a CT scan of the head was ordered which revealed no evidence of hemorrhage.  Patient's TPA was on hold overnight to the CT scan came back and has been restarted on TPA and heparin.  Patient to go back to the OR today for further lysis of clots.     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  Patient also remains on IV heparin drip with slow IV hydration.  Patient has a known history of retroperitoneal fibrosis which is being treated at [facility] with Imuran and steroids.  Patient to go back to the OR for further treatment today.  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  Dilated LEFT common and external iliac veins extending into LEFT common femoral vein with surrounding soft tissue edema and associated significant edema of the proximal LEFT lower extremity highly suspicious for deep venous thrombosis; venous duplex imaging recommended.Abnormal soft tissue surrounding mid and distal abdominal aorta extending into the proximal common iliac arteries, appears fairly concentric, question aortic wall thickening such as from a large vessel vasculitis though this could also be seen with retroperitoneal fibrosis; the smooth concentric nature makes this less likely to represent thrombus.   BILATERAL renal cysts.  Mild RIGHT hydronephrosis and proximal hydroureter with 2 mm mid RIGHT ureteral calculus.  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  3] history of bipolar disorder continue Klonopin Depakote and Clozaril.  4] history of hypertension continue Inderal.   5] history of CKD stage III 2 stable  6] history of COPD stable  7] sinus tachycardia increase Inderal to 10 twice daily question related to pain follow-up closely.  8] leukocytosis secondary to chronic steroids.  No evidence of infection as of today.   DVT prophylaxis: Heparin Code Status: Full code Family Communication: No family available Disposition Plan: To be determined patient is in the hospital for lysis of his clots in his left lower extremity.  He had one procedure done already he has another procedure today for the lysis of blood clots  extensive left lower extremity blood clots.  Patient lives at home with his parents.   Consultants: Vascular    Procedures: Lysis of clots in the left lower extremity 09/17/2017. Antimicrobials: None Subjective: Resting in bed in no acute distress     Objective: Vitals:   09/18/17 0600 09/18/17 0700 09/18/17 0730 09/18/17 0800  BP: 118/81 108/79 134/89 120/85  Pulse: (!) 114 (!) 114 (!) 118 (!) 114  Resp: Temp:   98.9 F (37.2 C)   TempSrc:   Oral   SpO2: 94% 93% 96% 96%  Weight:      Height:        Intake/Output Summary (Last 24 hours) at 09/18/2017 0946 Last data filed at 09/18/2017 0800 Gross per 24 hour  Intake 3997.23 ml  Output 2150 ml  Net 1847.23 ml   Filed Weights   09/17/17 0614 09/17/17 1810 09/18/17 0500  Weight: 94.8 kg (209 lb 1.6 oz) 96.3 kg (212 lb 4.9 oz) 95.1 kg (209 lb 10.5 oz)    Examination:  General exam: Appears calm and comfortable  Respiratory system: Clear to auscultation. Respiratory effort normal. Cardiovascular system: S1 & S2 heard, RRR. No JVD, murmurs, rubs, gallops or clicks. No pedal edema. Gastrointestinal system: Abdomen is nondistended, soft and nontender. No organomegaly or masses felt. Normal bowel sounds heard. Central nervous system: Alert and oriented. No focal neurological deficits. Extremities: extensive left lower extremity swelling and edema.   Skin: No rashes, lesions or ulcers Psychiatry: Judgement and insight appear normal. Mood &  affect appropriate.     Data Reviewed: I have personally reviewed following labs and imaging studies  CBC: Recent Labs  Lab 09/15/17 2151 09/16/17 0645 09/17/17 0455 09/17/17 1719 09/17/17 2228 09/18/17 0450  WBC 13.1* 12.7* 12.5* 10.8* 14.8* 15.8*  NEUTROABS 10.3*  --   --   --   --  12.2*  HGB 15.3 13.7 14.4 14.6 15.0 14.0  HCT 46.9 43.1 45.5 45.0 46.2 44.0  MCV 89.7 89.8 90.1 88.9 89.9 89.6  PLT 161 146* 164 162 147* 122*   Basic Metabolic Panel: Recent Labs  Lab 09/15/17 1117 09/16/17 0645  NA 141 144  K 4.2 4.0  CL 106 110  CO2 21* 26  GLUCOSE 135* 89  BUN 12 10  CREATININE 1.34* 1.03  CALCIUM 9.3 8.5*   GFR: Estimated Creatinine Clearance: 95.4 mL/min (by C-G formula based on SCr of 1.03  mg/dL). Liver Function Tests: No results for input(s): AST, ALT, ALKPHOS, BILITOT, PROT, ALBUMIN in the last 168 hours. No results for input(s): LIPASE, AMYLASE in the last 168 hours. No results for input(s): AMMONIA in the last 168 hours. Coagulation Profile: Recent Labs  Lab 09/15/17 1440 09/16/17 0645  INR 0.96 1.13   Cardiac Enzymes: No results for input(s): CKTOTAL, CKMB, CKMBINDEX, TROPONINI in the last 168 hours. BNP (last 3 results) No results for input(s): PROBNP in the last 8760 hours. HbA1C: No results for input(s): HGBA1C in the last 72 hours. CBG: Recent Labs  Lab 09/17/17 1638  GLUCAP 136*   Lipid Profile: No results for input(s): CHOL, HDL, LDLCALC, TRIG, CHOLHDL, LDLDIRECT in the last 72 hours. Thyroid Function Tests: No results for input(s): TSH, T4TOTAL, FREET4, T3FREE, THYROIDAB in the last 72 hours. Anemia Panel: No results for input(s): VITAMINB12, FOLATE, FERRITIN, TIBC, IRON, RETICCTPCT in the last 72 hours. Sepsis Labs: No results for input(s): PROCALCITON, LATICACIDVEN in the last 168 hours.  Recent Results (from the past 240 hour(s))  MRSA PCR Screening     Status: None   Collection Time: 09/17/17  6:14 PM  Result Value Ref Range Status   MRSA by PCR NEGATIVE NEGATIVE Final    Comment:        The GeneXpert MRSA Assay (FDA approved for NASAL specimens only), is one component of a comprehensive MRSA colonization surveillance program. It is not intended to diagnose MRSA infection nor to guide or monitor treatment for MRSA infections. Performed at [facility], [address] [place], [place] [address]          Radiology Studies: Ct Head Wo Contrast  Result Date: 09/18/2017 CLINICAL DATA:  Altered level of consciousness. Pupillary change. Status post tPA for DVT. EXAM: CT HEAD WITHOUT CONTRAST TECHNIQUE: Contiguous axial images were obtained from the base of the skull through the vertex without intravenous contrast. COMPARISON:   CT HEAD Sep 15, 2017 FINDINGS: BRAIN: No intraparenchymal hemorrhage, mass effect nor midline shift. The ventricles and sulci are normal. No acute large vascular territory infarcts. No abnormal extra-axial fluid collections. Basal cisterns are patent. VASCULAR: Unremarkable. SKULL/SOFT TISSUES: No skull fracture. No significant soft tissue swelling. ORBITS/SINUSES: The included ocular globes and orbital contents are normal.The mastoid aircells and included paranasal sinuses are well-aerated. OTHER: None. IMPRESSION: Negative noncontrast CT HEAD. Electronically Signed   By: Awilda Metro M.D.   On: 09/18/2017 04:32        Scheduled Meds: . atropine  2 drop Sublingual Daily  . clonazePAM  1 mg Oral QHS  . cloZAPine  300 mg Oral BID  . divalproex  500 mg Oral QHS  . gabapentin  100 mg Oral BID  . mouth rinse  15 mL Mouth Rinse BID  . pantoprazole  40 mg Oral Daily  . predniSONE  10 mg Oral Daily  . propranolol  10 mg Oral Daily  . sodium chloride flush  3 mL Intravenous Q12H   Continuous Infusions: . sodium chloride 150 mL/hr at 09/18/17 0800  . sodium chloride    . alteplase (LIMB ISCHEMIA) 10 mg in normal saline (0.02 mg/mL) infusion 1 mg/hr (09/18/17 0800)  .  ceFAZolin (ANCEF) IV Stopped (09/18/17 0645)  . heparin 800 Units/hr (09/18/17 0800)     LOS: 3 days     Alwyn Ren, MD [facility] If 7PM-7AM, please contact night-coverage [email or web address] Password [facility] 09/18/2017, 9:46 AM

## 2017-09-18 NOTE — Progress Notes (Signed)
On call MD notified and updated regarding CT result as well as lab work. MD advised to restart TPA infusion via sheath and maintain Heparin gtt at 800 units/hr. Patient resting comfortably at this time.

## 2017-09-18 NOTE — Op Note (Signed)
    Patient name: Mark [name] MRN: [id number] DOB: 1961/01/27 Sex: male  09/18/2017 Pre-operative Diagnosis: Extensive left lower extremity DVT Post-operative diagnosis:  Same Surgeon:  Apolinar Junes C. Randie Heinz, MD Procedure Performed: 1.  IVUS of IVC left common and external iliac veins left common femoral and femoral veins and left popliteal vein  2.  Mechanical thrombectomy of left common and external iliac veins with Inari clottriever 3.  Left lower extremity venogram 4.  Stent of the left common and external iliac veins with 18 x 90 Wallstent x2  Indications: 57 year old male with recent history of extensive left lower extremity DVT. He has a history of retroperitoneal fibrosis.  He underwent lysis of the left lower extremity DVT the day prior to this procedure.  Findings: The clot had dissolved from the left popliteal into the left external iliac veins.  In the left common iliac vein there was in the evidence of acute on chronic appearing clot common iliac vein and left remained occluded.  Following mechanical thrombectomy we removed all of the acute clot with 2 passes.  We then placed a stent in the left common and external iliac vein but the patient began moving and the stent did jump back after balloon dilatation.  We then placed another stent proximally.  At completion where we are once occluded we had a 6 mm lumen on average diameter and at the terminal aspect of the stent centrally we had a 10 mm lumen by volume average.   Procedure:  The patient was identified in the holding area and taken to room 8.  He was placed prone on the table sterilely prepped and draped in the left popliteal fossae with the sheath in catheter in place and timeout was called.  We then exchanged the occluder wire for an Amplatz wire which was placed into his innominate vein on the right.  We then gave the patient 10,000 units of heparin.  Intravascular ultrasound was performed from the IVC back to the popliteal vein  with the above findings.  We elected to perform mechanical thrombectomy.  The Inari sheath was placed.  We then performed 2 passes with the NR catheter and removed both acute and chronic appearing thrombus.  IVIS demonstrated resolution of all the acute appearing thrombus.  We then balloon dilated our occluded common iliac vein with a 10 mm balloon.  We then placed an 18 x 90 stent.  We attempted to balloon dilate this with a 14 mm balloon but the patient became quite agitated.  On moving the stent then jumped back outside of the occluded area.  We then elected to place a second stent which was also 18 x 90 terminating in the confluence of the IVC.  This was dilated with a first a 10 mm balloon and a 12 mm balloon.  Completion demonstrated that we only had a 6 mm lumen where we were once occluded we did a 10 mm lumen at the IVC.  We did attempted multiple balloon dilatations this is the best we could achieve.  We performed venography which demonstrated flow channel all the way from the femoral vein through the IVC.  Satisfied with this the catheter and wire were removed.  The sheath was pulled and pressure held until hemostasis obtained.  He did tolerate this procedure well without immediate complication.  Next  Contrast: 35 cc.  Jahson Emanuele C. Randie Heinz, MD [facility] [place] Office: [phone] Pager: [phone]

## 2017-09-18 NOTE — Progress Notes (Signed)
ANTICOAGULATION CONSULT NOTE   Pharmacy Consult for Heparin Indication: DVT  No Known Allergies  Patient Measurements: Height:  (180.3 cm) Weight: 209 lb 10.5 oz (95.1 kg) IBW/kg (Calculated) : 75.3 Heparin Dosing Weight: 91 kg   Vital Signs: Temp: 99.4 F (37.4 C) (05/13 1153) Temp Source: Oral (05/13 1153) BP: 144/90 (05/13 1210) Pulse Rate: 96 (05/13 1210)  Labs: Recent Labs    09/15/17 1440  09/16/17 0645  09/17/17 2228 09/18/17 0450 09/18/17 0957 09/18/17 1138  HGB  --    < > 13.7   < > 15.0 14.0 14.1  --   HCT  --    < > 43.1   < > 46.2 44.0 43.7  --   PLT  --    < > 146*   < > 147* 122* 96*  --   APTT  --   --  100*  --   --   --   --   --   LABPROT 12.7  --  14.4  --   --   --   --   --   INR 0.96  --  1.13  --   --   --   --   --   HEPARINUNFRC  --    < > 0.69   < > 0.20* <0.10*  --  <0.10*  CREATININE  --   --  1.03  --   --   --   --  1.35*   < > = values in this interval not displayed.    Estimated Creatinine Clearance: 72.8 mL/min (A) (by C-G formula based on SCr of 1.35 mg/dL (H)).   Assessment: 57 y.o. male with DVT, alteplase via lysis catheter now stopped, on IV heparin at 800 units/hr.  Heparin level remains undetectable.  Spoke to Dr. Randie Heinz - patient going to OR now for thrombectomy.    Goal of Therapy:  Heparin level 0.3-0.7 units/ml Monitor platelets by anticoagulation protocol: Yes   Plan:  F/u any plans for heparin after OR.  Jenetta Downer, [facility] Clinical Pharmacist Pager [phone]  09/18/2017 1:34 PM

## 2017-09-19 ENCOUNTER — Encounter (HOSPITAL_COMMUNITY): Payer: Self-pay | Admitting: Vascular Surgery

## 2017-09-19 ENCOUNTER — Telehealth: Payer: Self-pay | Admitting: Vascular Surgery

## 2017-09-19 DIAGNOSIS — F319 Bipolar disorder, unspecified: Secondary | ICD-10-CM

## 2017-09-19 DIAGNOSIS — I10 Essential (primary) hypertension: Secondary | ICD-10-CM

## 2017-09-19 DIAGNOSIS — I82432 Acute embolism and thrombosis of left popliteal vein: Secondary | ICD-10-CM

## 2017-09-19 DIAGNOSIS — J449 Chronic obstructive pulmonary disease, unspecified: Secondary | ICD-10-CM

## 2017-09-19 LAB — BASIC METABOLIC PANEL
ANION GAP: 7 (ref 5–15)
BUN: 11 mg/dL (ref 6–20)
CALCIUM: 9.1 mg/dL (ref 8.9–10.3)
CO2: 26 mmol/L (ref 22–32)
Chloride: 111 mmol/L (ref 101–111)
Creatinine, Ser: 1.28 mg/dL — ABNORMAL HIGH (ref 0.61–1.24)
GFR calc Af Amer: >60 mL/min (ref >60)
GLUCOSE: 105 mg/dL — AB (ref 65–99)
Potassium: 4.2 mmol/L (ref 3.5–5.1)
Sodium: 144 mmol/L (ref 135–145)

## 2017-09-19 LAB — CBC
HCT: 38.2 % — ABNORMAL LOW (ref 39.0–52.0)
HEMATOCRIT: 41.3 % (ref 39.0–52.0)
HEMOGLOBIN: 12.1 g/dL — AB (ref 13.0–17.0)
Hemoglobin: 12.9 g/dL — ABNORMAL LOW (ref 13.0–17.0)
MCH: 28.3 pg (ref 26.0–34.0)
MCH: 28 pg (ref 26.0–34.0)
MCHC: 31.7 g/dL (ref 30.0–36.0)
MCHC: 31.2 g/dL (ref 30.0–36.0)
MCV: 89.3 fL (ref 78.0–100.0)
MCV: 89.6 fL (ref 78.0–100.0)
PLATELETS: 95 10*3/uL — AB (ref 150–400)
Platelets: 103 10*3/uL — ABNORMAL LOW (ref 150–400)
RBC: 4.28 MIL/uL (ref 4.22–5.81)
RBC: 4.61 MIL/uL (ref 4.22–5.81)
RDW: 16.7 % — AB (ref 11.5–15.5)
RDW: 16.5 % — AB (ref 11.5–15.5)
WBC: 10.6 10*3/uL — ABNORMAL HIGH (ref 4.0–10.5)
WBC: 11.1 10*3/uL — ABNORMAL HIGH (ref 4.0–10.5)

## 2017-09-19 LAB — HEPARIN LEVEL (UNFRACTIONATED)
HEPARIN UNFRACTIONATED: 0.19 [IU]/mL — AB (ref 0.30–0.70)
HEPARIN UNFRACTIONATED: 0.25 [IU]/mL — AB (ref 0.30–0.70)

## 2017-09-19 MED ORDER — APIXABAN 5 MG PO TABS
10.0000 mg | ORAL_TABLET | Freq: Two times a day (BID) | ORAL | Status: DC
  Administered 2017-09-19: 10 mg via ORAL
  Filled 2017-09-19 (×2): qty 2

## 2017-09-19 MED ORDER — ASPIRIN 81 MG PO TBEC: 81.0000 mg | DELAYED_RELEASE_TABLET | Freq: Every day | ORAL | 2 refills | Status: DC

## 2017-09-19 MED ORDER — APIXABAN 5 MG PO TABS: ORAL_TABLET | ORAL | 3 refills | Status: DC

## 2017-09-19 MED ORDER — PREDNISONE 10 MG PO TABS: 10.0000 mg | ORAL_TABLET | Freq: Every day | ORAL | Status: DC

## 2017-09-19 MED ORDER — APIXABAN 5 MG PO TABS: 5.0000 mg | ORAL_TABLET | Freq: Two times a day (BID) | ORAL | Status: DC

## 2017-09-19 NOTE — Care Management Note (Addendum)
Case Management Note Donn Pierini RN,BSN Unit [facility] 1-22 Case Manager  [phone]  Patient Details  Name: Mark [name] MRN: [id number] Date of Birth: [birth year]  Subjective/Objective:  PT admitted s/p lysis followed by mechanical thrombectomy and stenting of his left common and external iliac veins for extensive left lower extremity DVT with retroperitoneal fibrosis.                    Action/Plan: PTA pt lived at home- anticipate return home,(per bedside RN- pt lives with parents wed-sat-and has an aide that comes 3hr/day- then Sun-Tues pt lives at a group home)- referral received for new eliquis at discharge- pt has active medicaid- eliquis is on the preferred drug list for Medicaid $3.70 copay- spoke with pt at bedside- coverage info shared and provided pt with 30 day free card to use on discharge. Pt uses [facility].  Update-1400- order placed for RW- call placed to [facility] with [facility] for DME need- RW to be delivered to room prior to discharge.   Expected Discharge Date:                  Expected Discharge Plan:  Home/Self Care  In-House Referral:  NA  Discharge planning Services  CM Consult, Medication Assistance  Post Acute Care Choice:  NA Choice offered to:     DME Arranged:    DME Agency:     HH Arranged:    HH Agency:     Status of Service:  Completed, signed off  If discussed at Long Length of Stay Meetings, dates discussed:    Discharge Disposition: home/self care   Additional Comments:  Darrold Span, RN 09/19/2017, 12:56 PM

## 2017-09-19 NOTE — Progress Notes (Signed)
  Progress Note    09/19/2017 9:20 AM 1 Day Post-Op  Subjective: No acute complaints today.  Vitals:   09/19/17 0600 09/19/17 0700  BP: (!) 153/90 (!) 143/85  Pulse: (!) 101 99  Resp: (!) 21 16  Temp:    SpO2: 97% 97%    Physical Exam: He is awake and alert Nonlabored respirations His left lower extremity edema has significantly improved.  CBC    Component Value Date/Time   WBC 10.6 (H) 09/19/2017 0228   RBC 4.28 09/19/2017 0228   HGB 12.1 (L) 09/19/2017 0228   HCT 38.2 (L) 09/19/2017 0228   PLT 95 (L) 09/19/2017 0228   MCV 89.3 09/19/2017 0228   MCH 28.3 09/19/2017 0228   MCHC 31.7 09/19/2017 0228   RDW 16.7 (H) 09/19/2017 0228   LYMPHSABS 2.8 09/18/2017 0450   MONOABS 0.8 09/18/2017 0450   EOSABS 0.0 09/18/2017 0450   BASOSABS 0.0 09/18/2017 0450    BMET    Component Value Date/Time   NA 144 09/19/2017 0228   K 4.2 09/19/2017 0228   CL 111 09/19/2017 0228   CO2 26 09/19/2017 0228   GLUCOSE 105 (H) 09/19/2017 0228   BUN 11 09/19/2017 0228   CREATININE 1.28 (H) 09/19/2017 0228   CALCIUM 9.1 09/19/2017 0228   GFRNONAA >60 09/19/2017 0228   GFRAA >60 09/19/2017 0228    INR    Component Value Date/Time   INR 1.13 09/16/2017 0645     Intake/Output Summary (Last 24 hours) at 09/19/2017 0920 Last data filed at 09/19/2017 0800 Gross per 24 hour  Intake 1608.29 ml  Output 2150 ml  Net -541.71 ml     Assessment:  57 y.o. male is status post lysis followed by mechanical thrombectomy and stenting of his left common and external iliac veins for extensive left lower extremity DVT with retroperitoneal fibrosis.    Plan: Okay for discharge from vascular standpoint Will need anticoagulation and aspirin as outpatient TED hose today and will fit for compression stockings as outpatient Follow-up in 4 to 6 weeks with duplex.   Shanik Brookshire C. Randie Heinz, MD [facility] [place] Office: [phone] Pager: [phone]  09/19/2017 9:20 AM

## 2017-09-19 NOTE — Telephone Encounter (Signed)
-----   Message from Westley Hummer, RN sent at 09/19/2017  9:27 AM EDT ----- Regarding: Appointment   ----- Message ----- From: Maeola Harman, MD Sent: 09/19/2017   9:21 AM To: [address]  Mark [name] [id number] 11-12-1960  F/u 4-6 weeks with me. Needs ivc/iliac duplex

## 2017-09-19 NOTE — Progress Notes (Signed)
ANTICOAGULATION CONSULT NOTE   Pharmacy Consult for Heparin Indication: DVT  No Known Allergies  Patient Measurements: Height:  (180.3 cm) Weight: 209 lb 10.5 oz (95.1 kg) IBW/kg (Calculated) : 75.3 Heparin Dosing Weight: 91 kg   Vital Signs: Temp: 98.4 F (36.9 C) (05/13 1900) Temp Source: Oral (05/13 1900) BP: 151/91 (05/14 0300) Pulse Rate: 97 (05/14 0300)  Labs: Recent Labs    09/16/17 0645  09/18/17 0450 09/18/17 0957 09/18/17 1138 09/19/17 0228  HGB 13.7   < > 14.0 14.1  --  12.1*  HCT 43.1   < > 44.0 43.7  --  38.2*  PLT 146*   < > 122* 96*  --  95*  APTT 100*  --   --   --   --   --   LABPROT 14.4  --   --   --   --   --   INR 1.13  --   --   --   --   --   HEPARINUNFRC 0.69   < > <0.10*  --  <0.10* 0.19*  CREATININE 1.03  --   --   --  1.35* 1.28*   < > = values in this interval not displayed.    Estimated Creatinine Clearance: 76.7 mL/min (A) (by C-G formula based on SCr of 1.28 mg/dL (H)).   Assessment: 57 y.o. male with DVT, alteplase via lysis catheter now stopped and s/p thrombectomy 5/13.   Heparin level below goal: 0.19, CBC down slightly 14.1> 12.1, PLT remain low 95, no infusion issues; RN reports slight bleeding from patient's mouth earlier in her shift, but none at this time.   Goal of Therapy:  Heparin level 0.3-0.7 units/ml Monitor platelets by anticoagulation protocol: Yes   Plan:  Increase heparin gtt to 1250 units/hr  6 hour heparin level Daily heparin level, CBC   Ruben Im, PharmD Clinical Pharmacist 09/19/2017 3:26 AM

## 2017-09-19 NOTE — Telephone Encounter (Signed)
sch appt 10/16/12 9am  IVC/ILIAC Duplex    10/20/17 1030am P/O spk to pt mother about appt

## 2017-09-19 NOTE — Progress Notes (Signed)
Spoke with physician about Eliquis prescription needing to be sent to [facility] on [address] d/t regular pharmacy that he uses already closed for the day.  Prescription sent over to [facility] on [address] per physician.  Family notified of this.

## 2017-09-19 NOTE — Progress Notes (Signed)
Called mom and Dad to let them know that patient will be going home when they can come to get him.  Dad states he will be here after work around 4 or shortly thereafter

## 2017-09-19 NOTE — Progress Notes (Signed)
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  On IV heparin drip.  Vascular intervention with mechanical thrombectomy left common external iliac veins 09/18/2017.  Vascular surgery to follow  History of retroperitoneal fibrosis.  Patient receives Imuran and steroids.  Follows up with [facility] rheumatologist.  History of bipolar disorder continue Klonopin Depakote and Clozaril.  hypertension continue Inderal.   History of CKD.  Closely monitor BMP.  Creatinine one-point   COPD stable.  Continue oxygen nebulizers as needed  leukocytosis secondary to chronic steroids.    No evidence of infection   DVT prophylaxis: Heparin  Code Status: Full code  Family Communication: No family available  Disposition Plan: To be determined   Consultants: Vascular  Procedures: Excision of clots left lower extremity on 09/17/2017  Antimicrobials: None  Subjective:  Patient feels okay today denies any pain no nausea vomiting  Objective: Vitals:   09/19/17 0400 09/19/17 0500 09/19/17 0600 09/19/17 0700  BP: 134/85 136/86 (!) 153/90 (!) 143/85  Pulse: 97 97 (!) 101 99  Resp: 16 15  (!) 21 16  Temp:      TempSrc:      SpO2: 96% 95% 97% 97%  Weight:      Height:        Intake/Output Summary (Last 24 hours) at 09/19/2017 0751 Last data filed at 09/19/2017 0700 Gross per 24 hour  Intake 2077.19 ml  Output 2150 ml  Net -72.81 ml   Filed Weights   09/17/17 0614 09/17/17 1810 09/18/17 0500  Weight: 94.8 kg (209 lb 1.6 oz) 96.3 kg (212 lb 4.9 oz) 95.1 kg (209 lb 10.5 oz)    Examination:  General exam: Appears calm and comfortable ,Not in distress,average built HEENT:PERRL,Oral mucosa moist, Ear/Nose normal on gross exam Respiratory system: Bilateral equal air entry, normal vesicular breath sounds, no wheezes or crackles  Cardiovascular system: S1 & S2 heard, RRR. No JVD, murmurs, rubs, gallops or clicks. No pedal edema. Gastrointestinal system: Abdomen is nondistended, soft and nontender. No organomegaly or masses felt. Normal bowel sounds heard. Central nervous system: Alert and oriented. No focal neurological deficits. Extremities: No edema, no clubbing ,no cyanosis, distal peripheral pulses palpable. Skin: No rashes, lesions or ulcers,no icterus ,no pallor MSK: Normal muscle bulk,tone ,power.  Lower extremity compression psychiatry: Judgement and insight appear normal. Mood & affect appropriate.   Data Reviewed: I have personally reviewed following labs and imaging studies  CBC: Recent Labs  Lab 09/15/17 2151  09/17/17 1719 09/17/17 2228 09/18/17 0450 09/18/17 0957 09/19/17 0228  WBC 13.1*   < > 10.8* 14.8* 15.8* 12.5* 10.6*  NEUTROABS 10.3*  --   --   --  12.2*  --   --   HGB 15.3   < >  14.6 15.0 14.0 14.1 12.1*  HCT 46.9   < > 45.0 46.2 44.0 43.7 38.2*  MCV 89.7   < > 88.9 89.9 89.6 90.5 89.3  PLT 161   < > 162 147* 122* 96* 95*   < > = values in this interval not displayed.   Basic Metabolic Panel: Recent Labs  Lab 09/15/17 1117 09/16/17 0645 09/18/17 1138 09/19/17 0228  NA 141 144 141 144  K 4.2 4.0 4.1 4.2  CL 106 110 110 111  CO2 21*  GLUCOSE 135* 89 127* 105*  BUN CREATININE 1.34* 1.03 1.35* 1.28*  CALCIUM 9.3 8.5* 8.9 9.1   GFR: Estimated Creatinine Clearance: 76.7 mL/min (A) (by C-G formula based on SCr of 1.28 mg/dL (H)). Liver Function Tests: No results for input(s): AST, ALT, ALKPHOS, BILITOT, PROT, ALBUMIN in the last 168 hours. No results for input(s): LIPASE, AMYLASE in the last 168 hours. No results for input(s): AMMONIA in the last 168 hours. Coagulation Profile: Recent Labs  Lab 09/15/17 1440 09/16/17 0645  INR 0.96 1.13   Cardiac Enzymes: No results for input(s): CKTOTAL, CKMB, CKMBINDEX, TROPONINI in the last 168 hours. BNP (last 3 results) No results for input(s): PROBNP in the last 8760 hours. HbA1C: No results for input(s): HGBA1C in the last 72 hours. CBG: Recent Labs  Lab 09/17/17 1638  GLUCAP 136*   Lipid Profile: No results for input(s): CHOL, HDL, LDLCALC, TRIG, CHOLHDL, LDLDIRECT in the last 72 hours. Thyroid Function Tests: No results for input(s): TSH, T4TOTAL, FREET4, T3FREE, THYROIDAB in the last 72 hours. Anemia Panel: No results for input(s): VITAMINB12, FOLATE, FERRITIN, TIBC, IRON, RETICCTPCT in the last 72 hours. Sepsis Labs: No results for input(s): PROCALCITON, LATICACIDVEN in the last 168 hours.  Recent Results (from the past 240 hour(s))  MRSA PCR Screening     Status: None   Collection Time: 09/17/17  6:14 PM  Result Value Ref Range Status   MRSA by PCR NEGATIVE NEGATIVE Final    Comment:        The GeneXpert MRSA Assay (FDA approved for NASAL specimens only), is one component of a comprehensive MRSA colonization surveillance program. It is not intended to diagnose MRSA infection nor to guide or monitor treatment for MRSA infections. Performed at [facility], [address], [place], [place] [address]       Radiology Studies: Ct Head Wo Contrast  Result Date: 09/18/2017 CLINICAL DATA:  Altered level of consciousness.  Pupillary change. Status post tPA for DVT. EXAM: CT HEAD WITHOUT CONTRAST TECHNIQUE: Contiguous axial images were obtained from the base of the skull through the vertex without intravenous contrast. COMPARISON:  CT HEAD Sep 15, 2017 FINDINGS: BRAIN: No intraparenchymal hemorrhage, mass effect nor midline shift. The ventricles and sulci are normal. No acute large vascular territory infarcts. No abnormal extra-axial fluid collections. Basal cisterns are patent. VASCULAR: Unremarkable. SKULL/SOFT TISSUES: No skull fracture. No significant soft tissue swelling. ORBITS/SINUSES: The included ocular globes and orbital contents are normal.The mastoid aircells and included paranasal sinuses are well-aerated. OTHER: None. IMPRESSION: Negative noncontrast CT HEAD. Electronically Signed   By: Awilda Metro M.D.   On: 09/18/2017 04:32     Scheduled Meds: . aspirin EC  81 mg Oral Daily  . atropine  2 drop Sublingual Daily  . clonazePAM  1 mg Oral QHS  . cloZAPine  300 mg Oral BID  . divalproex  500 mg Oral QHS  .  gabapentin  100 mg Oral BID  . mouth rinse  15 mL Mouth Rinse BID  . pantoprazole  40 mg Oral Daily  . predniSONE  10 mg Oral Daily  . propranolol  10 mg Oral BID  . sodium chloride flush  3 mL Intravenous Q12H  . sodium chloride flush  3 mL Intravenous Q12H   Continuous Infusions: . sodium chloride    . sodium chloride    . heparin 1,250 Units/hr (09/19/17 0349)     LOS: 4 days    Time spent: More than 50% of that time was spent in counseling and/or coordination of care.   Joycelyn Das, MD [facility] Pager [phone]  If 7PM-7AM, please contact night-coverage [email or web address] Password [facility] 09/19/2017, 7:51 AM

## 2017-09-19 NOTE — Progress Notes (Signed)
Discharge instructions given to caregiver.  Questions answered.

## 2017-09-19 NOTE — Discharge Summary (Signed)
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 Patient was noted to have acute deep vein thrombosis and was admitted to the hospital.   Following conditions were addressed during hospitalization,  Acute extensive DVT of the entire left lower leg-status post lytic therapy with TPA and vascular intervention with mechanical thrombectomy left common external iliac veins 09/18/2017.    Vascular surgery has seen the patient today recommend anticoagulation on discharge with vascular surgery follow-up as outpatient.   History of retroperitoneal fibrosis.  Patient receives Imuran and steroids.  Follows up with [facility] rheumatologist.  History of bipolar disorder continue Klonopin Depakote and Clozaril.  hypertension continue Inderal.   History of CKD, COPD remained stable.  Disposition. Home.   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   apixaban 5 MG Tabs tablet Commonly known as:  ELIQUIS 10 mg PO BID x 7 days then 5 mg PO BID to continue   aspirin 81 MG EC tablet Take 1 tablet (81 mg total) by mouth daily. Start taking on:  09/20/2017   atropine 1 % ophthalmic solution Place 2 drops under the tongue daily. For drooling   azaTHIOprine 50 MG tablet Commonly known as:  IMURAN Take 50 mg by mouth 2 (two) times daily.   cloZAPine 100 MG tablet Commonly known as:  CLOZARIL Take 300 mg by mouth 2 (two) times daily.   cyclobenzaprine 5 MG tablet Commonly known as:  FLEXERIL Take 1 tablet (5 mg total) by mouth 3 (three) times daily as needed for muscle spasms.   diclofenac sodium 1 % Gel Commonly known as:  VOLTAREN Apply 2 g topically 4 (four) times daily.   divalproex 500 MG 24 hr tablet Commonly known as:  DEPAKOTE ER Take 500 mg by mouth at bedtime.   gabapentin 100 MG capsule Commonly known as:  NEURONTIN Take 100 mg by mouth 2 (two) times daily.   omeprazole 20 MG capsule Commonly known as:  PRILOSEC Take 20 mg by mouth daily before breakfast.   predniSONE 10 MG tablet Commonly known as:  DELTASONE Take 20 mg by mouth daily. What changed:  Another medication with the same name was changed. Make sure you understand how and when to take each.   predniSONE 10 MG tablet Commonly known as:  DELTASONE Take 1 tablet (10  mg total) by mouth daily. What changed:  how much to take   propranolol 10 MG tablet Commonly known as:  INDERAL Take 10 mg by mouth every morning.      Follow-up Information    Fleet Contras, MD. Call in 1 week(s).   Specialty:  Internal Medicine Contact information: [address] [facility] [address] [place] [address] [phone]        Maeola Harman, MD. Call in 1  week(s).   Specialties:  Vascular Surgery, Cardiology Contact information: [address] [place] [address] [phone]          No Known Allergies  Consultations: vascular surgery   Procedures/Studies: Ct Head Wo Contrast  Result Date: 09/18/2017 CLINICAL DATA:  Altered level of consciousness. Pupillary change. Status post tPA for DVT. EXAM: CT HEAD WITHOUT CONTRAST TECHNIQUE: Contiguous axial images were obtained from the base of the skull through the vertex without intravenous contrast. COMPARISON:  CT HEAD Sep 15, 2017 FINDINGS: BRAIN: No intraparenchymal hemorrhage, mass effect nor midline shift. The ventricles and sulci are normal. No acute large vascular territory infarcts. No abnormal extra-axial fluid collections. Basal cisterns are patent. VASCULAR: Unremarkable. SKULL/SOFT TISSUES: No skull fracture. No significant soft tissue swelling. ORBITS/SINUSES: The included ocular globes and orbital contents are normal.The mastoid aircells and included paranasal sinuses are well-aerated. OTHER: None. IMPRESSION: Negative noncontrast CT HEAD. Electronically Signed   By: Awilda Metro M.D.   On: 09/18/2017 04:32   Ct Angio Chest Pe W And/or Wo Contrast  Result Date: 09/15/2017 CLINICAL DATA:  LEFT lower extremity swelling, growth of abdomen, COPD, hypertension, smoker EXAM: CT ANGIOGRAPHY CHEST, ABDOMEN AND PELVIS TECHNIQUE: Multidetector CT imaging through the chest, abdomen and pelvis was performed using the standard protocol during bolus administration of intravenous contrast. Multiplanar reconstructed images and MIPs were obtained and reviewed to evaluate the vascular anatomy. CONTRAST:  ISOVUE-370 IOPAMIDOL (ISOVUE-370) INJECTION 76% IV. No oral contrast. COMPARISON:  None FINDINGS: CTA CHEST FINDINGS Cardiovascular: Mild aneurysmal dilatation of the ascending thoracic aorta 4.0 cm transverse image 53. No aortic dissection. No pericardial effusion. Pulmonary arteries well  opacified and patent. No evidence of pulmonary embolism. Mediastinum/Nodes: Scattered air within esophagus without gross wall thickening. Base of cervical region normal appearance. No thoracic adenopathy. Lungs/Pleura: Dependent atelectasis in the posterior lungs bilaterally. No acute infiltrate, pleural effusion or pneumothorax. Musculoskeletal: Osseous structures unremarkable. Review of the MIP images confirms the above findings. CTA ABDOMEN AND PELVIS FINDINGS VASCULAR Aorta: Normal caliber without aneurysm or dissection. Abnormal soft tissue density surrounding the contrast opacified aortic lumen, favor aortic wall thickening over thrombus but retroperitoneal fibrosis not completely excluded. Question of vasculitis is raised. 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 Vessel otherwise patent. SMA: Widely patent Renals: Patent. Single LEFT renal artery. On RIGHT a main renal artery and a small accessory renal artery are noted. IMA: Patent Inflow: Widely patent, normal appearance Veins: Portal, superior mesenteric and splenic veins patent. IVC appears decompressed. Dilated LEFT common iliac and external iliac veins extending into LEFT common femoral vein with surrounding infiltrative changes and impaired enhancement on delayed images, highly suspicious for deep venous thrombosis. Review of the MIP images confirms the above findings. NON-VASCULAR Hepatobiliary: Gallbladder and liver normal appearance Pancreas: Normal appearance Spleen: Normal appearance Adrenals/Urinary Tract: BILATERAL renal cysts. Mild RIGHT hydronephrosis and hydroureter. 2 mm mid RIGHT ureteral calculus image 148. No LEFT side hydronephrosis or ureteral dilatation. Bladder is mildly distended, slightly asymmetric into the  upper RIGHT pelvis without obvious mass or wall thickening. Stomach/Bowel: Upper normal caliber appendix without inflammation or wall thickening. Stomach and  bowel loops otherwise unremarkable. Lymphatic: No adenopathy Reproductive: Unremarkable prostate gland and seminal vesicles Other: Associated retroperitoneal edema adjacent to the LEFT common and external iliac veins extent. Significant soft tissue edema including both intermuscular and intramuscular edema at the proximal LEFT lower extremity. No free intraperitoneal air or fluid. Small umbilical hernia containing fat. Musculoskeletal: Retrolisthesis at L5-S1 with disc degeneration, vacuum phenomenon, and mild bulging disc. Narrowing of spinal canal at L4-L5 with question broad-based disc herniation versus bulge. Review of the MIP images confirms the above findings. IMPRESSION: No evidence of aortic dissection. Abnormal soft tissue surrounding mid and distal abdominal aorta extending into the proximal common iliac arteries, appears fairly concentric, question aortic wall thickening such as from a large vessel vasculitis though this could also be seen with retroperitoneal fibrosis; the smooth concentric nature makes this less likely to represent thrombus. 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 Recommend annual imaging followup by CTA or MRA. This recommendation follows 2010 ACCF/AHA/AATS/ACR/ASA/SCA/SCAI/SIR/STS/SVM Guidelines for the Diagnosis and Management of Patients with Thoracic Aortic Disease. Circulation. 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 Dilated LEFT common and external iliac veins extending into LEFT common femoral vein with surrounding soft tissue edema and associated significant edema of the proximal LEFT lower extremity highly suspicious for deep venous thrombosis; venous duplex imaging recommended. BILATERAL renal cysts. Mild RIGHT hydronephrosis and proximal hydroureter with 2 mm mid RIGHT ureteral calculus. Findings called to Dr. Scotty Court on 09/15/2017 at 1920 hours.  Electronically Signed   By: Ulyses Southward M.D.   On: 09/15/2017 19:23   Ct Angio Abd/pel W/ And/or W/o  Result Date: 09/15/2017 CLINICAL DATA:  LEFT lower extremity swelling, growth of abdomen, COPD, hypertension, smoker EXAM: CT ANGIOGRAPHY CHEST, ABDOMEN AND PELVIS TECHNIQUE: Multidetector CT imaging through the chest, abdomen and pelvis was performed using the standard protocol during bolus administration of intravenous contrast. Multiplanar reconstructed images and MIPs were obtained and reviewed to evaluate the vascular anatomy. CONTRAST:  ISOVUE-370 IOPAMIDOL (ISOVUE-370) INJECTION 76% IV. No oral contrast. COMPARISON:  None FINDINGS: CTA CHEST FINDINGS Cardiovascular: Mild aneurysmal dilatation of the ascending thoracic aorta 4.0 cm transverse image 53. No aortic dissection. No pericardial effusion. Pulmonary arteries well opacified and patent. No evidence of pulmonary embolism. Mediastinum/Nodes: Scattered air within esophagus without gross wall thickening. Base of cervical region normal appearance. No thoracic adenopathy. Lungs/Pleura: Dependent atelectasis in the posterior lungs bilaterally. No acute infiltrate, pleural effusion or pneumothorax. Musculoskeletal: Osseous structures unremarkable. Review of the MIP images confirms the above findings. CTA ABDOMEN AND PELVIS FINDINGS VASCULAR Aorta: Normal caliber without aneurysm or dissection. Abnormal soft tissue density surrounding the contrast opacified aortic lumen, favor aortic wall thickening over thrombus but retroperitoneal fibrosis not completely excluded. Question of vasculitis is raised. 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 Vessel otherwise patent. SMA: Widely patent Renals: Patent. Single LEFT renal artery. On RIGHT a main renal artery and a small accessory renal artery are noted. IMA: Patent Inflow: Widely patent, normal appearance Veins: Portal, superior mesenteric  and splenic veins patent. IVC appears decompressed. Dilated LEFT common iliac and external iliac veins extending into LEFT common femoral vein with surrounding infiltrative changes and impaired enhancement on delayed images, highly suspicious for deep venous thrombosis. Review of the MIP images  confirms the above findings. NON-VASCULAR Hepatobiliary: Gallbladder and liver normal appearance Pancreas: Normal appearance Spleen: Normal appearance Adrenals/Urinary Tract: BILATERAL renal cysts. Mild RIGHT hydronephrosis and hydroureter. 2 mm mid RIGHT ureteral calculus image 148. No LEFT side hydronephrosis or ureteral dilatation. Bladder is mildly distended, slightly asymmetric into the upper RIGHT pelvis without obvious mass or wall thickening. Stomach/Bowel: Upper normal caliber appendix without inflammation or wall thickening. Stomach and bowel loops otherwise unremarkable. Lymphatic: No adenopathy Reproductive: Unremarkable prostate gland and seminal vesicles Other: Associated retroperitoneal edema adjacent to the LEFT common and external iliac veins extent. Significant soft tissue edema including both intermuscular and intramuscular edema at the proximal LEFT lower extremity. No free intraperitoneal air or fluid. Small umbilical hernia containing fat. Musculoskeletal: Retrolisthesis at L5-S1 with disc degeneration, vacuum phenomenon, and mild bulging disc. Narrowing of spinal canal at L4-L5 with question broad-based disc herniation versus bulge. Review of the MIP images confirms the above findings. IMPRESSION: No evidence of aortic dissection. Abnormal soft tissue surrounding mid and distal abdominal aorta extending into the proximal common iliac arteries, appears fairly concentric, question aortic wall thickening such as from a large vessel vasculitis though this could also be seen with retroperitoneal fibrosis; the smooth concentric nature makes this less likely to represent thrombus. Aneurysmal dilatation  ascending thoracic aorta 4.0 cm diameter, recommendation below. Recommend annual imaging followup by CTA or MRA. This recommendation follows 2010 ACCF/AHA/AATS/ACR/ASA/SCA/SCAI/SIR/STS/SVM Guidelines for the Diagnosis and Management of Patients with Thoracic Aortic Disease. Circulation. 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 Dilated LEFT common and external iliac veins extending into LEFT common femoral vein with surrounding soft tissue edema and associated significant edema of the proximal LEFT lower extremity highly suspicious for deep venous thrombosis; venous duplex imaging recommended. BILATERAL renal cysts. Mild RIGHT hydronephrosis and proximal hydroureter with 2 mm mid RIGHT ureteral calculus. Findings called to Dr. Scotty Court on 09/15/2017 at 1920 hours. Electronically Signed   By: Ulyses Southward M.D.   On: 09/15/2017 19:23       Subjective:   Discharge Exam: Vitals:   09/19/17 0600 09/19/17 0700  BP: (!) 153/90 (!) 143/85  Pulse: (!) 101 99  Resp: (!) 21 16  Temp:    SpO2: 97% 97%   Vitals:   09/19/17 0400 09/19/17 0500 09/19/17 0600 09/19/17 0700  BP: 134/85 136/86 (!) 153/90 (!) 143/85  Pulse: 97 97 (!) 101 99  Resp: 16 15 (!) 21 16  Temp:      TempSrc:      SpO2: 96% 95% 97% 97%  Weight:      Height:        General: Pt is alert, awake, not in acute distress Cardiovascular: RRR, S1/S2 +, no rubs, no gallops Respiratory: CTA bilaterally, no wheezing, no rhonchi Abdominal: Soft, NT, ND, bowel sounds + Extremities: no edema, no cyanosis    The results of significant diagnostics from this hospitalization (including imaging, microbiology, ancillary and laboratory) are listed below for reference.     Microbiology: Recent Results (from the past 240 hour(s))  MRSA PCR Screening     Status: None   Collection Time: 09/17/17  6:14 PM  Result Value Ref Range Status   MRSA by PCR NEGATIVE NEGATIVE Final     Comment:        The GeneXpert MRSA Assay (FDA approved for NASAL specimens only), is one component of a comprehensive MRSA colonization surveillance program. It is not intended to diagnose MRSA infection  nor to guide or monitor treatment for MRSA infections. Performed at [facility], [address], [place], [place] [address]      Labs: BNP (last 3 results) No results for input(s): BNP in the last 8760 hours. Basic Metabolic Panel: Recent Labs  Lab 09/15/17 1117 09/16/17 0645 09/18/17 1138 09/19/17 0228  NA 141 144 141 144  K 4.2 4.0 4.1 4.2  CL 106 110 110 111  CO2 21* GLUCOSE 135* 89 127* 105*  BUN CREATININE 1.34* 1.03 1.35* 1.28*  CALCIUM 9.3 8.5* 8.9 9.1   Liver Function Tests: No results for input(s): AST, ALT, ALKPHOS, BILITOT, PROT, ALBUMIN in the last 168 hours. No results for input(s): LIPASE, AMYLASE in the last 168 hours. No results for input(s): AMMONIA in the last 168 hours. CBC: Recent Labs  Lab 09/15/17 2151  09/17/17 2228 09/18/17 0450 09/18/17 0957 09/19/17 0228 09/19/17 0925  WBC 13.1*   < > 14.8* 15.8* 12.5* 10.6* 11.1*  NEUTROABS 10.3*  --   --  12.2*  --   --   --   HGB 15.3   < > 15.0 14.0 14.1 12.1* 12.9*  HCT 46.9   < > 46.2 44.0 43.7 38.2* 41.3  MCV 89.7   < > 89.9 89.6 90.5 89.3 89.6  PLT 161   < > 147* 122* 96* 95* 103*   < > = values in this interval not displayed.   Cardiac Enzymes: No results for input(s): CKTOTAL, CKMB, CKMBINDEX, TROPONINI in the last 168 hours. BNP: Invalid input(s): POCBNP CBG: Recent Labs  Lab 09/17/17 1638  GLUCAP 136*   D-Dimer No results for input(s): DDIMER in the last 72 hours. Hgb A1c No results for input(s): HGBA1C in the last 72 hours. Lipid Profile No results for input(s): CHOL, HDL, LDLCALC, TRIG, CHOLHDL, LDLDIRECT in the last 72 hours. Thyroid function studies No results for input(s): TSH, T4TOTAL, T3FREE, THYROIDAB in the last 72 hours.  Invalid  input(s): FREET3 Anemia work up No results for input(s): VITAMINB12, FOLATE, FERRITIN, TIBC, IRON, RETICCTPCT in the last 72 hours. Urinalysis    Component Value Date/Time   COLORURINE YELLOW 08/29/2011 1059   APPEARANCEUR CLEAR 08/29/2011 1059   LABSPEC 1.016 08/29/2011 1059   PHURINE 6.0 08/29/2011 1059   GLUCOSEU NEGATIVE 08/29/2011 1059   HGBUR NEGATIVE 08/29/2011 1059   BILIRUBINUR NEGATIVE 08/29/2011 1059   KETONESUR NEGATIVE 08/29/2011 1059   PROTEINUR NEGATIVE 08/29/2011 1059   UROBILINOGEN 1.0 08/29/2011 1059   NITRITE NEGATIVE 08/29/2011 1059   LEUKOCYTESUR NEGATIVE 08/29/2011 1059   Sepsis Labs Invalid input(s): PROCALCITONIN,  WBC,  LACTICIDVEN Microbiology Recent Results (from the past 240 hour(s))  MRSA PCR Screening     Status: None   Collection Time: 09/17/17  6:14 PM  Result Value Ref Range Status   MRSA by PCR NEGATIVE NEGATIVE Final    Comment:        The GeneXpert MRSA Assay (FDA approved for NASAL specimens only), is one component of a comprehensive MRSA colonization surveillance program. It is not intended to diagnose MRSA infection nor to guide or monitor treatment for MRSA infections. Performed at [facility], [address], [place], [place] [address]      Time coordinating discharge: 35 minutes  SIGNED:   Joycelyn Das, MD  [facility] 09/19/2017, 12:05 PM Pager [phone]  If 7PM-7AM, please contact night-coverage [email or web address] Password [email or web address]

## 2017-09-19 NOTE — Discharge Instructions (Signed)
Information on my medicine - ELIQUIS (apixaban)  Why was Eliquis prescribed for you? Eliquis was prescribed to treat blood clots that may have been found in the veins of your legs (deep vein thrombosis) or in your lungs (pulmonary embolism) and to reduce the risk of them occurring again.  What do You need to know about Eliquis ? The starting dose is 10 mg (two 5 mg tablets) taken TWICE daily for the FIRST SEVEN (7) DAYS, then on (enter date)  09/26/17  the dose is reduced to ONE 5 mg tablet taken TWICE daily.  Eliquis may be taken with or without food.   Try to take the dose about the same time in the morning and in the evening. If you have difficulty swallowing the tablet whole please discuss with your pharmacist how to take the medication safely.  Take Eliquis exactly as prescribed and DO NOT stop taking Eliquis without talking to the doctor who prescribed the medication.  Stopping may increase your risk of developing a new blood clot.  Refill your prescription before you run out.  After discharge, you should have regular check-up appointments with your healthcare provider that is prescribing your Eliquis.    What do you do if you miss a dose? If a dose of ELIQUIS is not taken at the scheduled time, take it as soon as possible on the same day and twice-daily administration should be resumed. The dose should not be doubled to make up for a missed dose.  Important Safety Information A possible side effect of Eliquis is bleeding. You should call your healthcare provider right away if you experience any of the following: ? Bleeding from an injury or your nose that does not stop. ? Unusual colored urine (red or dark brown) or unusual colored stools (red or black). ? Unusual bruising for unknown reasons. ? A serious fall or if you hit your head (even if there is no bleeding).  Some medicines may interact with Eliquis and might increase your risk of bleeding or clotting while on  Eliquis. To help avoid this, consult your healthcare provider or pharmacist prior to using any new prescription or non-prescription medications, including herbals, vitamins, non-steroidal anti-inflammatory drugs (NSAIDs) and supplements.  This website has more information on Eliquis (apixaban): http://[email or web address].com/eliquis/home

## 2017-09-20 ENCOUNTER — Other Ambulatory Visit: Payer: Self-pay

## 2017-09-20 DIAGNOSIS — I82401 Acute embolism and thrombosis of unspecified deep veins of right lower extremity: Secondary | ICD-10-CM

## 2017-10-16 ENCOUNTER — Ambulatory Visit (HOSPITAL_COMMUNITY)
Admit: 2017-10-16 | Discharge: 2017-10-16 | Disposition: A | Discharge status: Home / Self Care | Payer: Medicaid Other | Attending: Vascular Surgery | Admitting: Vascular Surgery

## 2017-10-16 DIAGNOSIS — I82401 Acute embolism and thrombosis of unspecified deep veins of right lower extremity: Secondary | ICD-10-CM | POA: Insufficient documentation

## 2017-10-16 DIAGNOSIS — Z95828 Presence of other vascular implants and grafts: Secondary | ICD-10-CM | POA: Insufficient documentation

## 2017-10-20 ENCOUNTER — Ambulatory Visit (INDEPENDENT_AMBULATORY_CARE_PROVIDER_SITE_OTHER): Payer: Medicaid Other | Admitting: Vascular Surgery

## 2017-10-20 ENCOUNTER — Other Ambulatory Visit: Payer: Self-pay

## 2017-10-20 ENCOUNTER — Encounter: Payer: Self-pay | Admitting: Vascular Surgery

## 2017-10-20 VITALS — BP 136/84 | HR 88 | Temp 97.4°F | Resp 18 | Ht 71.0 in | Wt 205.0 lb

## 2017-10-20 DIAGNOSIS — I82401 Acute embolism and thrombosis of unspecified deep veins of right lower extremity: Secondary | ICD-10-CM

## 2017-10-20 NOTE — Progress Notes (Signed)
Patient ID: Mark [name], male   DOB: 01/28/1961, 57 y.o.   MRN: [id number]  Reason for Consult: Routine Post Op   Referred by Fleet ContrasAvbuere, Edwin, MD  Subjective:     HPI:  Mark CorpusRonald E Younge is a 57 y.o. male with history of left lower extremity extensive DVT underwent mechanical thrombectomy after lysis and stenting of his common and external iliac veins with 2 stents.  He now follows up remains on his blood thinner is doing very well.  He denies any further swelling of his leg.  He is not wearing compression stockings.  Currently lives in a group home.  Past Medical History:  Diagnosis Date  . COPD (chronic obstructive pulmonary disease) (HCC)   . Hypertension   . Schizophrenia (HCC)    History reviewed. No pertinent family history. Past Surgical History:  Procedure Laterality Date  . LOWER EXTREMITY ANGIOGRAPHY N/A 09/18/2017   Procedure: LOWER EXTREMITY ANGIOGRAPHY- Recheck lysis;  Surgeon: Maeola Harmanain, Bobbi Yount Christopher, MD;  [facility];  Service: Cardiovascular;  Laterality: N/A;  . PERIPHERAL VASCULAR INTERVENTION Left 09/18/2017   Procedure: PERIPHERAL VASCULAR INTERVENTION;  Surgeon: Maeola Harmanain, Caycee Wanat Christopher, MD;  [facility];  Service: Cardiovascular;  Laterality: Left;  . THROMBECTOMY FEMORAL ARTERY Left 09/17/2017   Procedure: POSSIBLE THROMBECTOMY;  Surgeon: Maeola Harmanain, Josimar Corning Christopher, MD;  [facility];  Service: Vascular;  Laterality: Left;  [name] Kitchen. VENOGRAM Left 09/17/2017   Procedure: ULTRASOUND POPLITEAL ACCESS; CENTRAL VENOGRAM, IVVS, LYSIS CATHETER PLACEMENT;  Surgeon: Maeola Harmanain, Rafiq Bucklin Christopher, MD;  [facility];  Service: Vascular;  Laterality: Left;    Short Social History:  Social History   Tobacco Use  . Smoking status: Current Every Day Smoker    Packs/day: 0.50    Types: Cigarettes  . Smokeless tobacco: Never Used  Substance Use Topics  . Alcohol use: No    No Known Allergies  Current Outpatient Medications    Medication Sig Dispense Refill  . albuterol (PROVENTIL HFA;VENTOLIN HFA) 108 (90 Base) MCG/ACT inhaler Inhale 1-2 puffs into the lungs every 6 (six) hours as needed for wheezing or shortness of breath. 1 Inhaler 0  . apixaban (ELIQUIS) 5 MG TABS tablet 10 mg PO BID x 7 days then 5 mg PO BID to continue 60 tablet 3  . aspirin EC 81 MG EC tablet Take 1 tablet (81 mg total) by mouth daily. 30 tablet 2  . atropine 1 % ophthalmic solution Place 2 drops under the tongue daily. For drooling    . azaTHIOprine (IMURAN) 50 MG tablet Take 50 mg by mouth 2 (two) times daily.    . cloZAPine (CLOZARIL) 100 MG tablet Take 300 mg by mouth 2 (two) times daily.      . cyclobenzaprine (FLEXERIL) 5 MG tablet Take 1 tablet (5 mg total) by mouth 3 (three) times daily as needed for muscle spasms. 30 tablet 0  . diclofenac sodium (VOLTAREN) 1 % GEL Apply 2 g topically 4 (four) times daily. 1 Tube 0  . divalproex (DEPAKOTE ER) 500 MG 24 hr tablet Take 500 mg by mouth at bedtime.     . gabapentin (NEURONTIN) 100 MG capsule Take 100 mg by mouth 2 (two) times daily.    [name] Kitchen. omeprazole (PRILOSEC) 20 MG capsule Take 20 mg by mouth daily before breakfast.     . predniSONE (DELTASONE) 10 MG tablet Take 20 mg by mouth daily.    . predniSONE (DELTASONE) 10 MG tablet Take 1 tablet (10 mg total)  by mouth daily.    . propranolol (INDERAL) 10 MG tablet Take 10 mg by mouth every morning.      No current facility-administered medications for this visit.     Review of Systems  Constitutional:  Constitutional negative. HENT: HENT negative.  Eyes: Eyes negative.  Respiratory: Respiratory negative.  Cardiovascular: Cardiovascular negative.  GI: Gastrointestinal negative.  Musculoskeletal: Musculoskeletal negative.  Skin: Skin negative.  Neurological: Neurological negative. Hematologic: Hematologic/lymphatic negative.  Psychiatric: Psychiatric negative.        Objective:  Objective   Vitals:   10/20/17 1010  BP: 136/84   Pulse: 88  Resp: 18  Temp: (!) 97.4 F (36.3 C)  TempSrc: Oral  SpO2: 97%  Weight: 205 lb (93 kg)  Height: 5\' 11"  (1.803 m)   Body mass index is 28.59 kg/m.  Physical Exam  Constitutional: He appears well-developed.  HENT:  Head: Normocephalic.  Eyes: Pupils are equal, round, and reactive to light.  Neck: Normal range of motion.  Cardiovascular: Normal rate.  Pulses:      Radial pulses are 2+ on the right side, and 2+ on the left side.       Femoral pulses are 2+ on the right side, and 2+ on the left side.      Popliteal pulses are 2+ on the right side, and 2+ on the left side.  Abdominal: Soft.  Musculoskeletal: He exhibits no edema.  Neurological: He is alert.  Skin: Skin is warm and dry.  Psychiatric: He has a normal mood and affect.    Data: I reviewed his recent IVC iliac duplex which demonstrates patent common femoral veins bilaterally and stent on the left common and external iliac veins.     Assessment/Plan:     57 year old male follows up after stenting of his left common and external iliac veins following mechanical thrombectomy.  He no longer has any swelling continues on his Eliquis.  There is difficulty in obtaining compression stockings for him given his status of living in a group home and his schizophrenia but he states that he will try.  He should continue ambulation we will continue his Eliquis at least until next follow-up in 6 months.     Maeola Harman MD [facility]

## 2017-10-25 ENCOUNTER — Other Ambulatory Visit: Payer: Self-pay

## 2017-10-25 DIAGNOSIS — I82401 Acute embolism and thrombosis of unspecified deep veins of right lower extremity: Secondary | ICD-10-CM

## 2018-04-13 ENCOUNTER — Ambulatory Visit: Payer: Medicaid Other | Admitting: Vascular Surgery

## 2018-04-13 ENCOUNTER — Encounter (HOSPITAL_COMMUNITY): Payer: Medicaid Other

## 2018-05-07 ENCOUNTER — Other Ambulatory Visit: Payer: Self-pay

## 2018-05-07 ENCOUNTER — Ambulatory Visit (HOSPITAL_COMMUNITY)
Admission: RE | Admit: 2018-05-07 | Discharge: 2018-05-07 | Disposition: A | Discharge status: Home / Self Care | Payer: Medicaid Other | Source: Ambulatory Visit | Attending: Vascular Surgery | Admitting: Vascular Surgery

## 2018-05-07 ENCOUNTER — Ambulatory Visit (INDEPENDENT_AMBULATORY_CARE_PROVIDER_SITE_OTHER): Payer: Medicaid Other | Admitting: Vascular Surgery

## 2018-05-07 ENCOUNTER — Encounter: Payer: Self-pay | Admitting: Vascular Surgery

## 2018-05-07 VITALS — BP 152/92 | HR 101 | Resp 16 | Ht 71.0 in | Wt 210.0 lb

## 2018-05-07 DIAGNOSIS — I82401 Acute embolism and thrombosis of unspecified deep veins of right lower extremity: Secondary | ICD-10-CM | POA: Insufficient documentation

## 2018-05-07 NOTE — Progress Notes (Signed)
Patient ID: Mark [name], male   DOB: 05/19/1960, 57 y.o.   MRN: [id number]  Reason for Consult: Follow-up (6 month f/u )   Referred by Fleet ContrasAvbuere, Edwin, MD  Subjective:     HPI:  Mark [name] is a 57 y.o. 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  He has not been compliant with compression stockings given that he lives in a group home and has schizophrenia.  He does have compression stockings according to his care provider.  He remains on Eliquis daily.  He does walk.  Chief complaint today is left buttock and left knee pain.  He does not have any swelling in his left lower extremity.  He has no skin changes at this time does not have any evidence of venous insufficiency.  Past Medical History:  Diagnosis Date  . COPD (chronic obstructive pulmonary disease) (HCC)   . Hypertension   . Schizophrenia (HCC)    History reviewed. No pertinent family history. Past Surgical History:  Procedure Laterality Date  . LOWER EXTREMITY ANGIOGRAPHY N/A 09/18/2017   Procedure: LOWER EXTREMITY ANGIOGRAPHY- Recheck lysis;  Surgeon: Maeola Harmanain, Brandon Christopher, MD;  [facility];  Service: Cardiovascular;  Laterality: N/A;  . PERIPHERAL VASCULAR INTERVENTION Left 09/18/2017   Procedure: PERIPHERAL VASCULAR INTERVENTION;  Surgeon: Maeola Harmanain, Brandon Christopher, MD;  [facility];  Service: Cardiovascular;  Laterality: Left;  . THROMBECTOMY FEMORAL ARTERY Left 09/17/2017   Procedure: POSSIBLE THROMBECTOMY;  Surgeon: Maeola Harmanain, Brandon Christopher, MD;  [facility];  Service: Vascular;  Laterality: Left;  [name] Kitchen. VENOGRAM Left 09/17/2017   Procedure: ULTRASOUND POPLITEAL ACCESS; CENTRAL VENOGRAM, IVVS, LYSIS CATHETER PLACEMENT;  Surgeon: Maeola Harmanain, Brandon Christopher, MD;  [facility];  Service: Vascular;  Laterality: Left;    Short Social History:  Social History   Tobacco Use    . Smoking status: Current Every Day Smoker    Packs/day: 0.50    Types: Cigarettes  . Smokeless tobacco: Never Used  Substance Use Topics  . Alcohol use: No    No Known Allergies  Current Outpatient Medications  Medication Sig Dispense Refill  . albuterol (PROVENTIL HFA;VENTOLIN HFA) 108 (90 Base) MCG/ACT inhaler Inhale 1-2 puffs into the lungs every 6 (six) hours as needed for wheezing or shortness of breath. 1 Inhaler 0  . apixaban (ELIQUIS) 5 MG TABS tablet 10 mg PO BID x 7 days then 5 mg PO BID to continue 60 tablet 3  . aspirin EC 81 MG EC tablet Take 1 tablet (81 mg total) by mouth daily. 30 tablet 2  . atropine 1 % ophthalmic solution Place 2 drops under the tongue daily. For drooling    . azaTHIOprine (IMURAN) 50 MG tablet Take 50 mg by mouth 2 (two) times daily.    . cloZAPine (CLOZARIL) 100 MG tablet Take 300 mg by mouth 2 (two) times daily.      . cyclobenzaprine (FLEXERIL) 5 MG tablet Take 1 tablet (5 mg total) by mouth 3 (three) times daily as needed for muscle spasms. 30 tablet 0  . diclofenac sodium (VOLTAREN) 1 % GEL Apply 2 g topically 4 (four) times daily. 1 Tube 0  . divalproex (DEPAKOTE ER) 500 MG 24 hr tablet Take 500 mg by mouth at bedtime.     . gabapentin (NEURONTIN) 100 MG capsule Take 100 mg by mouth 2 (two) times daily.    [name] Kitchen. omeprazole (PRILOSEC) 20 MG  capsule Take 20 mg by mouth daily before breakfast.     . predniSONE (DELTASONE) 10 MG tablet Take 20 mg by mouth daily.    . predniSONE (DELTASONE) 10 MG tablet Take 1 tablet (10 mg total) by mouth daily.    . propranolol (INDERAL) 10 MG tablet Take 10 mg by mouth every morning.      No current facility-administered medications for this visit.     Review of Systems  Constitutional:  Constitutional negative. HENT: HENT negative.  Eyes: Eyes negative.  Cardiovascular: Cardiovascular negative.  GI: Gastrointestinal negative.  Musculoskeletal: Positive for joint pain.       Left buttock pain Skin: Skin  negative.  Neurological: Neurological negative. Hematologic: Hematologic/lymphatic negative.  Psychiatric: Psychiatric negative.        Objective:  Objective   Vitals:   05/07/18 0948  BP: (!) 152/92  Pulse: (!) 101  Resp: 16  SpO2: 96%  Weight: 210 lb (95.3 kg)  Height: 5\' 11"  (1.803 m)   Body mass index is 29.29 kg/m.  Physical Exam Constitutional:      Appearance: Normal appearance.  Eyes:     Pupils: Pupils are equal, round, and reactive to light.  Cardiovascular:     Rate and Rhythm: Normal rate.     Pulses:          Radial pulses are 2+ on the right side and 2+ on the left side.       Popliteal pulses are 2+ on the right side and 2+ on the left side.  Pulmonary:     Effort: Pulmonary effort is normal.  Abdominal:     General: Abdomen is flat.     Palpations: Abdomen is soft.  Musculoskeletal:        General: No swelling or deformity.  Skin:    General: Skin is warm and dry.     Capillary Refill: Capillary refill takes less than 2 seconds.  Neurological:     General: No focal deficit present.     Mental Status: He is alert and oriented to person, place, and time.  Psychiatric:        Mood and Affect: Mood normal.        Behavior: Behavior normal.        Thought Content: Thought content normal.        Judgment: Judgment normal.     Data: I have independently interpreted his IVC iliac duplex which demonstrates patency of his left common and external iliac vein stents with no evidence of thrombus     Assessment/Plan:     57 year old male status post lysis of his left lower extremity as well as stenting of his left common and external iliac veins for what was thought to be secondary to retroperitoneal fibrosis.  He continues on Eliquis at this time.  I have recommended he does wear his compression stockings that his father bought for him although I am unsure that he will be compliant.  He does not have any evidence of venous insufficiency at this time with  no further swelling.  We will follow him up in 6 further months with repeat studies at which time if they are normal he can go out to 1 year.  We can probably stop Eliquis at that time and start him on daily aspirin.  He is listed as taking aspirin at this time but states that he is not.  He has palpable pulses in his bilateral lower extremities to level of the popliteals  suggesting that his buttock and knee pain may be musculoskeletal.  If there are issues prior to this we can certainly see him sooner.     Maeola Harman MD [facility]

## 2018-11-04 DIAGNOSIS — F209 Schizophrenia, unspecified: Secondary | ICD-10-CM | POA: Insufficient documentation

## 2018-11-04 DIAGNOSIS — Z86010 Personal history of colonic polyps: Secondary | ICD-10-CM | POA: Insufficient documentation

## 2018-11-04 DIAGNOSIS — Z860101 Personal history of adenomatous and serrated colon polyps: Secondary | ICD-10-CM | POA: Insufficient documentation

## 2018-11-06 ENCOUNTER — Other Ambulatory Visit: Payer: Self-pay

## 2018-11-06 DIAGNOSIS — I82401 Acute embolism and thrombosis of unspecified deep veins of right lower extremity: Secondary | ICD-10-CM

## 2018-11-16 ENCOUNTER — Ambulatory Visit: Payer: Medicaid Other | Admitting: Family

## 2018-11-16 ENCOUNTER — Encounter (HOSPITAL_COMMUNITY): Payer: Medicaid Other

## 2018-12-14 ENCOUNTER — Ambulatory Visit (INDEPENDENT_AMBULATORY_CARE_PROVIDER_SITE_OTHER): Payer: Medicaid Other | Admitting: Family

## 2018-12-14 ENCOUNTER — Encounter: Payer: Self-pay | Admitting: Family

## 2018-12-14 ENCOUNTER — Ambulatory Visit (HOSPITAL_COMMUNITY)
Admission: RE | Admit: 2018-12-14 | Discharge: 2018-12-14 | Disposition: A | Discharge status: Home / Self Care | Payer: Medicaid Other | Source: Ambulatory Visit | Attending: Family | Admitting: Family

## 2018-12-14 ENCOUNTER — Other Ambulatory Visit: Payer: Self-pay

## 2018-12-14 VITALS — BP 136/84 | HR 88 | Temp 97.9°F | Resp 20 | Ht 71.0 in | Wt 219.0 lb

## 2018-12-14 DIAGNOSIS — I82401 Acute embolism and thrombosis of unspecified deep veins of right lower extremity: Secondary | ICD-10-CM | POA: Diagnosis present

## 2018-12-14 DIAGNOSIS — F17200 Nicotine dependence, unspecified, uncomplicated: Secondary | ICD-10-CM | POA: Diagnosis not present

## 2018-12-14 NOTE — Patient Instructions (Addendum)
Before your next abdominal ultrasound:  Avoid gas forming foods and beverages the day before the test.   Take two Extra-Strength Gas-X capsules at bedtime the night before the test. Take another two Extra-Strength Gas-X capsules in the middle of the night if you get up to the restroom, if not, first thing in the morning with water.  Do not chew gum.      Steps to Quit Smoking Smoking tobacco is the leading cause of preventable death. It can affect almost every organ in the body. Smoking puts you and people around you at risk for many serious, long-lasting (chronic) diseases. Quitting smoking can be hard, but it is one of the best things that you can do for your health. It is never too late to quit. How do I get ready to quit? When you decide to quit smoking, make a plan to help you succeed. Before you quit:  Pick a date to quit. Set a date within the next 2 weeks to give you time to prepare.  Write down the reasons why you are quitting. Keep this list in places where you will see it often.  Tell your family, friends, and co-workers that you are quitting. Their support is important.  Talk with your doctor about the choices that may help you quit.  Find out if your health insurance will pay for these treatments.  Know the people, places, things, and activities that make you want to smoke (triggers). Avoid them. What first steps can I take to quit smoking?  Throw away all cigarettes at home, at work, and in your car.  Throw away the things that you use when you smoke, such as ashtrays and lighters.  Clean your car. Make sure to empty the ashtray.  Clean your home, including curtains and carpets. What can I do to help me quit smoking? Talk with your doctor about taking medicines and seeing a counselor at the same time. You are more likely to succeed when you do both.  If you are pregnant or breastfeeding, talk with your doctor about counseling or other ways to quit smoking. Do not  take medicine to help you quit smoking unless your doctor tells you to do so. To quit smoking: Quit right away  Quit smoking totally, instead of slowly cutting back on how much you smoke over a period of time.  Go to counseling. You are more likely to quit if you go to counseling sessions regularly. Take medicine You may take medicines to help you quit. Some medicines need a prescription, and some you can buy over-the-counter. Some medicines may contain a drug called nicotine to replace the nicotine in cigarettes. Medicines may:  Help you to stop having the desire to smoke (cravings).  Help to stop the problems that come when you stop smoking (withdrawal symptoms). Your doctor may ask you to use:  Nicotine patches, gum, or lozenges.  Nicotine inhalers or sprays.  Non-nicotine medicine that is taken by mouth. Find resources Find resources and other ways to help you quit smoking and remain smoke-free after you quit. These resources are most helpful when you use them often. They include:  Online chats with a counselor.  Phone quitlines.  Printed self-help materials.  Support groups or group counseling.  Text messaging programs.  Mobile phone apps. Use apps on your mobile phone or tablet that can help you stick to your quit plan. There are many free apps for mobile phones and tablets as well as websites. Examples include   Quit Guide from the [employer] and smokefree.gov  What things can I do to make it easier to quit?   Talk to your family and friends. Ask them to support and encourage you.  Call a phone quitline (1-800-QUIT-NOW), reach out to support groups, or work with a Social worker.  Ask people who smoke to not smoke around you.  Avoid places that make you want to smoke, such as: ? Bars. ? Parties. ? Smoke-break areas at work.  Spend time with people who do not smoke.  Lower the stress in your life. Stress can make you want to smoke. Try these things to help your stress: ?  Getting regular exercise. ? Doing deep-breathing exercises. ? Doing yoga. ? Meditating. ? Doing a body scan. To do this, close your eyes, focus on one area of your body at a time from head to toe. Notice which parts of your body are tense. Try to relax the muscles in those areas. How will I feel when I quit smoking? Day 1 to 3 weeks Within the first 24 hours, you may start to have some problems that come from quitting tobacco. These problems are very bad 2-3 days after you quit, but they do not often last for more than 2-3 weeks. You may get these symptoms:  Mood swings.  Feeling restless, nervous, angry, or annoyed.  Trouble concentrating.  Dizziness.  Strong desire for high-sugar foods and nicotine.  Weight gain.  Trouble pooping (constipation).  Feeling like you may vomit (nausea).  Coughing or a sore throat.  Changes in how the medicines that you take for other issues work in your body.  Depression.  Trouble sleeping (insomnia). Week 3 and afterward After the first 2-3 weeks of quitting, you may start to notice more positive results, such as:  Better sense of smell and taste.  Less coughing and sore throat.  Slower heart rate.  Lower blood pressure.  Clearer skin.  Better breathing.  Fewer sick days. Quitting smoking can be hard. Do not give up if you fail the first time. Some people need to try a few times before they succeed. Do your best to stick to your quit plan, and talk with your doctor if you have any questions or concerns. Summary  Smoking tobacco is the leading cause of preventable death. Quitting smoking can be hard, but it is one of the best things that you can do for your health.  When you decide to quit smoking, make a plan to help you succeed.  Quit smoking right away, not slowly over a period of time.  When you start quitting, seek help from your doctor, family, or friends. This information is not intended to replace advice given to you by  your health care provider. Make sure you discuss any questions you have with your health care provider. Document Released: 02/19/2009 Document Revised: 07/13/2018 Document Reviewed: 07/14/2018 Elsevier Patient Education  [place].     Deep Vein Thrombosis  Deep vein thrombosis (DVT) is a condition in which a blood clot forms in a deep vein, such as a lower leg, thigh, or arm vein. A clot is blood that has thickened into a gel or solid. This condition is dangerous. It can lead to serious and even life-threatening complications if the clot travels to the lungs and causes a blockage (pulmonary embolism). It can also damage veins in the leg. This can result in leg pain, swelling, discoloration, and sores (post-thrombotic syndrome). What are the causes? This condition may  be caused by:  A slowdown of blood flow.  Damage to a vein.  A condition that causes blood to clot more easily, such as an inherited clotting disorder. What increases the risk? The following factors may make you more likely to develop this condition:  Being overweight.  Being older, especially over age 57.  Sitting or lying down for more than four hours.  Being in the hospital.  Lack of physical activity (sedentary lifestyle).  Pregnancy, being in childbirth, or having recently given birth.  Taking medicines that contain estrogen, such as medicines to prevent pregnancy.  Smoking.  A history of any of the following: ? Blood clots or a blood clotting disease. ? Peripheral vascular disease. ? Inflammatory bowel disease. ? Cancer. ? Heart disease. ? Genetic conditions that affect how your blood clots, such as Factor V Leiden mutation. ? Neurological diseases that affect your legs (leg paresis). ? A recent injury, such as a car accident. ? Major or lengthy surgery. ? A central line placed inside a large vein. What are the signs or symptoms? Symptoms of this condition include:  Swelling, pain, or  tenderness in an arm or leg.  Warmth, redness, or discoloration in an arm or leg. If the clot is in your leg, symptoms may be more noticeable or worse when you stand or walk. Some people may not develop any symptoms. How is this diagnosed? This condition is diagnosed with:  A medical history and physical exam.  Tests, such as: ? Blood tests. These are done to check how well your blood clots. ? Ultrasound. This is done to check for clots. ? Venogram. For this test, contrast dye is injected into a vein and X-rays are taken to check for any clots. How is this treated? Treatment for this condition depends on:  The cause of your DVT.  Your risk for bleeding or developing more clots.  Any other medical conditions that you have. Treatment may include:  Taking a blood thinner (anticoagulant). This type of medicine prevents clots from forming. It may be taken by mouth, injected under the skin, or injected through an IV (catheter).  Injecting clot-dissolving medicines into the affected vein (catheter-directed thrombolysis).  Having surgery. Surgery may be done to: ? Remove the clot. ? Place a filter in a large vein to catch blood clots before they reach the lungs. Some treatments may be continued for up to six months. Follow these instructions at home: If you are taking blood thinners:  Take the medicine exactly as told by your health care provider. Some blood thinners need to be taken at the same time every day. Do not skip a dose.  Talk with your health care provider before you take any medicines that contain aspirin or NSAIDs. These medicines increase your risk for dangerous bleeding.  Ask your health care provider about foods and drugs that could change the way the medicine works (may interact). Avoid those things if your health care provider tells you to do so.  Blood thinners can cause easy bruising and may make it difficult to stop bleeding. Because of this: ? Be very careful  when using knives, scissors, or other sharp objects. ? Use an electric razor instead of a blade. ? Avoid activities that could cause injury or bruising, and follow instructions about how to prevent falls.  Wear a medical alert bracelet or carry a card that lists what medicines you take. General instructions  Take over-the-counter and prescription medicines only as told by your health  care provider.  Return to your normal activities as told by your health care provider. Ask your health care provider what activities are safe for you.  Wear compression stockings if recommended by your health care provider.  Keep all follow-up visits as told by your health care provider. This is important. How is this prevented? To lower your risk of developing this condition again:  For 30 or more minutes every day, do an activity that: ? Involves moving your arms and legs. ? Increases your heart rate.  When traveling for longer than four hours: ? Exercise your arms and legs every hour. ? Drink plenty of water. ? Avoid drinking alcohol.  Avoid sitting or lying for a long time without moving your legs.  If you have surgery or you are hospitalized, ask about ways to prevent blood clots. These may include taking frequent walks or using anticoagulants.  Stay at a healthy weight.  If you are a woman who is older than age 57, avoid unnecessary use of medicines that contain estrogen, such as some birth control pills.  Do not use any products that contain nicotine or tobacco, such as cigarettes and e-cigarettes. This is especially important if you take estrogen medicines. If you need help quitting, ask your health care provider. Contact a health care provider if:  You miss a dose of your blood thinner.  Your menstrual period is heavier than usual.  You have unusual bruising. Get help right away if:  You have: ? New or increased pain, swelling, or redness in an arm or leg. ? Numbness or tingling in  an arm or leg. ? Shortness of breath. ? Chest pain. ? A rapid or irregular heartbeat. ? A severe headache or confusion. ? A cut that will not stop bleeding.  There is blood in your vomit, stool, or urine.  You have a serious fall or accident, or you hit your head.  You feel light-headed or dizzy.  You cough up blood. These symptoms may represent a serious problem that is an emergency. Do not wait to see if the symptoms will go away. Get medical help right away. Call your local emergency services (911 in the [place] Do not drive yourself to the hospital. Summary  Deep vein thrombosis (DVT) is a condition in which a blood clot forms in a deep vein, such as a lower leg, thigh, or arm vein.  Symptoms can include swelling, warmth, pain, and redness in your leg or arm.  This condition may be treated with a blood thinner (anticoagulant medicine), medicine that is injected to dissolve blood clots,compression stockings, or surgery.  If you are prescribed blood thinners, take them exactly as told. This information is not intended to replace advice given to you by your health care provider. Make sure you discuss any questions you have with your health care provider. Document Released: 04/25/2005 Document Revised: 04/07/2017 Document Reviewed: 09/23/2016 Elsevier Patient Education  2020 [employer]

## 2018-12-14 NOTE — Progress Notes (Signed)
CC: Follow up left iliac vein DVT  History of Present Illness  [name] is a 58 y.o. (07/22/1960) male who previously underwent stenting of his left common external iliac veins in May 2019 by Dr. 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  He has not been compliant with compression stockings given that he lives in a group home and has schizophrenia.  He does have compression stockings according to his care provider.    Dr. Randie Heinzain last evaluated pt on 05-07-18. At that time he he recommended that pt wear his compression stockings that his father bought for him although unsure that he will be compliant.  He did not have any evidence of venous insufficiency at that time with no further swelling. Dr. Randie Heinzain advised follow up in 6 further months with repeat studies at which time if they are normal he can go out to 1 year.  We can probably stop Eliquis at that time and start him on daily aspirin.  He is listed as taking aspirin at this time but states that he is not.  He had palpable pulses in his bilateral lower extremities to level of the popliteals suggesting that his buttock and knee pain may be musculoskeletal.  If there are issues prior to this we can certainly see him sooner.  He remains on Eliquis, and not taking ASA daily.  He does walk.   He does not c/o pain or fatigue in his legs with walking.  His caregiver Drinda Buttsnnette states that he is not wearing the knee high compression hose that he has.  Pt and Drinda Buttsnnette state that pt has no bleeding problems.   Pt states he is not drinking enough water, states he will try to drink more water.   He states the soles of his feet are numb and warm.   He has no problems breathing unless he walks fast. He denies chest pain.   Pt stays with Mark [name], his caretaker, phone number [phone].   He does not have any swelling in his left lower extremity.  He has no skin changes at this time does not have any evidence  of venous insufficiency   Past Medical History:  Diagnosis Date  . COPD (chronic obstructive pulmonary disease) (HCC)   . Hypertension   . Schizophrenia [name](HCC)     Social History Social History   Tobacco Use  . Smoking status: Current Every Day Smoker    Packs/day: 0.50    Types: Cigarettes  . Smokeless tobacco: Never Used  Substance Use Topics  . Alcohol use: No  . Drug use: Never    Family History History reviewed. No pertinent family history.  Surgical History Past Surgical History:  Procedure Laterality Date  . LOWER EXTREMITY ANGIOGRAPHY N/A 09/18/2017   Procedure: LOWER EXTREMITY ANGIOGRAPHY- Recheck lysis;  Surgeon: Maeola Harmanain, Brandon Christopher, MD;  [facility];  Service: Cardiovascular;  Laterality: N/A;  . PERIPHERAL VASCULAR INTERVENTION Left 09/18/2017   Procedure: PERIPHERAL VASCULAR INTERVENTION;  Surgeon: Maeola Harmanain, Brandon Christopher, MD;  [facility];  Service: Cardiovascular;  Laterality: Left;  . THROMBECTOMY FEMORAL ARTERY Left 09/17/2017   Procedure: POSSIBLE THROMBECTOMY;  Surgeon: Maeola Harmanain, Brandon Christopher, MD;  [facility];  Service: Vascular;  Laterality: Left;  Mark [name]. VENOGRAM Left 09/17/2017   Procedure: ULTRASOUND POPLITEAL ACCESS; CENTRAL VENOGRAM, IVVS, LYSIS CATHETER PLACEMENT;  Surgeon: Maeola Harmanain, Brandon Christopher, MD;  [facility];  Service: Vascular;  Laterality: Left;    No  Known Allergies  Current Outpatient Medications  Medication Sig Dispense Refill  . albuterol (PROVENTIL HFA;VENTOLIN HFA) 108 (90 Base) MCG/ACT inhaler Inhale 1-2 puffs into the lungs every 6 (six) hours as needed for wheezing or shortness of breath. 1 Inhaler 0  . apixaban (ELIQUIS) 5 MG TABS tablet 10 mg PO BID x 7 days then 5 mg PO BID to continue 60 tablet 3  . aspirin EC 81 MG EC tablet Take 1 tablet (81 mg total) by mouth daily. 30 tablet 2  . atropine 1 % ophthalmic solution Place 2 drops under the tongue daily. For drooling    .  azaTHIOprine (IMURAN) 50 MG tablet Take 50 mg by mouth 2 (two) times daily.    . cloZAPine (CLOZARIL) 100 MG tablet Take 300 mg by mouth 2 (two) times daily.      . cyclobenzaprine (FLEXERIL) 5 MG tablet Take 1 tablet (5 mg total) by mouth 3 (three) times daily as needed for muscle spasms. 30 tablet 0  . diclofenac sodium (VOLTAREN) 1 % GEL Apply 2 g topically 4 (four) times daily. 1 Tube 0  . divalproex (DEPAKOTE ER) 500 MG 24 hr tablet Take 500 mg by mouth at bedtime.     . gabapentin (NEURONTIN) 100 MG capsule Take 100 mg by mouth 2 (two) times daily.    Mark [name] omeprazole (PRILOSEC) 20 MG capsule Take 20 mg by mouth daily before breakfast.     . predniSONE (DELTASONE) 10 MG tablet Take 20 mg by mouth daily.    . predniSONE (DELTASONE) 10 MG tablet Take 1 tablet (10 mg total) by mouth daily.    . propranolol (INDERAL) 10 MG tablet Take 10 mg by mouth every morning.      No current facility-administered medications for this visit.     REVIEW OF SYSTEMS: see HPI for pertinent positives and negatives   Physical Examination  Vitals:   12/14/18 0907  BP: 136/84  Pulse: 88  Resp: 20  Temp: 97.9 F (36.6 C)  SpO2: 98%  Weight: 219 lb (99.3 kg)  Height: 5\' 11"  (1.803 m)   Body mass index is 30.54 kg/m.  General: Obese male in NAD.  HEENT:  No gross abnormalities Pulmonary: Respirations are non-labored, limited air movement in all fields, no rales, rhonchi, or wheezes.   Abdomen: Soft and non-tender with normal pitched bowel sounds.  Musculoskeletal: There are no major deformities.   Neurologic: No focal weakness or paresthesias are detected. Skin: There are no ulcer or rashes noted. Psychiatric: The patient has normal affect. Cardiovascular: There is a regular rate and rhythm without significant murmur appreciated.   Trace left pretibial pitting edema, no edema in right LE.    Vascular: Vessel Right Left  Radial 2+Palpable 2+Palpable  Carotid  without bruit  without bruit  Aorta  Not palpable N/A  Femoral 2+Palpable 2+Palpable  Popliteal 2+ palpable 2+ palpable  PT 2+Palpable 2+Palpable  DP 2+Palpable 2+Palpable     DATA  Left Iliac Vein Duplex (12-14-18): +-------------------------+---------+-----------+---------+-----------+--------+            EIV           [id number]-[id number]-[id number]-PatentLT-ThrombusComments +-------------------------+---------+-----------+---------+-----------+--------+ External Iliac Vein Prox                      patent                      +-------------------------+---------+-----------+---------+-----------+--------+ External Iliac Vein Mid  patent                      +-------------------------+---------+-----------+---------+-----------+--------+ External Iliac Vein                           patent                      Distal                                                                    +-------------------------+---------+-----------+---------+-----------+--------+ Summary: IVC/Iliac: No evidence of thrombus in the left external Iliac vein. Visualization of proximal Inferior Vena Cava, distal Inferior Vena Cava, mid inferior vena cava, proximal common Iliac, mid common Iliac and distal common Iliac was limited.    Medical Decision Making  Al CorpusRonald E Stenner is a 58 y.o. male who previously underwent stenting of his left common external iliac veins in May 2019 by Dr. 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  He ate breakfast this morning, and bowel gas obscured much of his left iliac vein system on ultrasound. No evidence of thrombus in the left external Iliac vein. Visualization of proximal Inferior Vena Cava, distal Inferior Vena Cava, mid inferior vena cava, proximal common Iliac, mid common Iliac and distal common Iliac was limited.  He has trace left pretibial edema.  He continues to smoke about 3/4 ppd, which is a risk factor for DVT,  PAD, stroke, and CAD. We discussed this. Over 3 minutes was spent counseling patient re smoking cessation, and patient was given several free resources re smoking cessation.   Return in 6 months with left iliac vein duplex. Continue Eliquis until he is seen in 6 months, he has no bleeding problems and he continues to smoke.  I advised pt to put on his knee high compression hose every morning, and take them off at night, we discussed the importance of wearing them.  Since he has not been wearing knee high compression hose, it is less likely that he will wear thigh high compression hose.   Thank you for allowing us to participate in this patient's care.  Charisse MarchSuzanne Raneshia Derick, RN, MSN, FNP-C [facility] [place] Office: [phone]  12/14/2018, 9:28 AM  Clinic MD: Myra GianottiBrabham on call

## 2019-02-05 ENCOUNTER — Other Ambulatory Visit: Payer: Self-pay | Admitting: Internal Medicine

## 2019-02-05 ENCOUNTER — Other Ambulatory Visit (HOSPITAL_COMMUNITY): Payer: Self-pay | Admitting: Internal Medicine

## 2019-02-05 DIAGNOSIS — Z87448 Personal history of other diseases of urinary system: Secondary | ICD-10-CM

## 2019-02-05 DIAGNOSIS — Z87898 Personal history of other specified conditions: Secondary | ICD-10-CM

## 2019-02-14 ENCOUNTER — Other Ambulatory Visit (HOSPITAL_COMMUNITY): Payer: Self-pay | Admitting: Internal Medicine

## 2019-02-14 DIAGNOSIS — Z87898 Personal history of other specified conditions: Secondary | ICD-10-CM

## 2019-02-14 DIAGNOSIS — Z87448 Personal history of other diseases of urinary system: Secondary | ICD-10-CM

## 2019-02-18 ENCOUNTER — Ambulatory Visit (HOSPITAL_COMMUNITY): Admission: RE | Admit: 2019-02-18 | Payer: Medicaid Other | Source: Ambulatory Visit

## 2019-02-25 ENCOUNTER — Ambulatory Visit (HOSPITAL_COMMUNITY): Payer: Medicaid Other

## 2019-02-25 ENCOUNTER — Encounter (HOSPITAL_COMMUNITY): Payer: Self-pay

## 2019-03-11 ENCOUNTER — Ambulatory Visit (HOSPITAL_COMMUNITY): Payer: Medicaid Other

## 2019-03-13 ENCOUNTER — Other Ambulatory Visit (HOSPITAL_COMMUNITY): Payer: Self-pay | Admitting: Internal Medicine

## 2019-03-13 DIAGNOSIS — Z87448 Personal history of other diseases of urinary system: Secondary | ICD-10-CM

## 2019-03-13 DIAGNOSIS — Z8719 Personal history of other diseases of the digestive system: Secondary | ICD-10-CM

## 2019-03-13 DIAGNOSIS — Z87898 Personal history of other specified conditions: Secondary | ICD-10-CM

## 2019-03-18 ENCOUNTER — Ambulatory Visit (HOSPITAL_COMMUNITY)
Admission: RE | Admit: 2019-03-18 | Discharge: 2019-03-18 | Disposition: A | Discharge status: Home / Self Care | Payer: Medicaid Other | Source: Ambulatory Visit | Attending: Internal Medicine | Admitting: Internal Medicine

## 2019-03-18 ENCOUNTER — Other Ambulatory Visit: Payer: Self-pay

## 2019-03-18 DIAGNOSIS — Z87898 Personal history of other specified conditions: Secondary | ICD-10-CM | POA: Diagnosis not present

## 2019-03-18 DIAGNOSIS — Z87448 Personal history of other diseases of urinary system: Secondary | ICD-10-CM

## 2019-03-18 MED ORDER — IOHEXOL 350 MG/ML SOLN
100.0000 mL | Freq: Once | INTRAVENOUS | Status: AC | PRN
  Administered 2019-03-18: 16:00:00 100 mL via INTRAVENOUS

## 2019-04-10 ENCOUNTER — Emergency Department (HOSPITAL_COMMUNITY)
Admission: EM | Admit: 2019-04-10 | Discharge: 2019-04-10 | Disposition: A | Discharge status: Home / Self Care | Payer: Medicaid Other | Attending: Emergency Medicine | Admitting: Emergency Medicine

## 2019-04-10 ENCOUNTER — Other Ambulatory Visit: Payer: Self-pay

## 2019-04-10 ENCOUNTER — Emergency Department (HOSPITAL_COMMUNITY): Payer: Medicaid Other

## 2019-04-10 DIAGNOSIS — I10 Essential (primary) hypertension: Secondary | ICD-10-CM | POA: Insufficient documentation

## 2019-04-10 DIAGNOSIS — Z7982 Long term (current) use of aspirin: Secondary | ICD-10-CM | POA: Diagnosis not present

## 2019-04-10 DIAGNOSIS — Z7901 Long term (current) use of anticoagulants: Secondary | ICD-10-CM | POA: Insufficient documentation

## 2019-04-10 DIAGNOSIS — Z79899 Other long term (current) drug therapy: Secondary | ICD-10-CM | POA: Diagnosis not present

## 2019-04-10 DIAGNOSIS — J449 Chronic obstructive pulmonary disease, unspecified: Secondary | ICD-10-CM | POA: Diagnosis not present

## 2019-04-10 DIAGNOSIS — R4182 Altered mental status, unspecified: Secondary | ICD-10-CM | POA: Diagnosis not present

## 2019-04-10 LAB — CBC WITH DIFFERENTIAL/PLATELET
Abs Immature Granulocytes: 0.02 10*3/uL (ref 0.00–0.07)
Basophils Absolute: 0 10*3/uL (ref 0.0–0.1)
Basophils Relative: 0 %
Eosinophils Absolute: 0.1 10*3/uL (ref 0.0–0.5)
Eosinophils Relative: 1 %
HCT: 49.1 % (ref 39.0–52.0)
Hemoglobin: 15.1 g/dL (ref 13.0–17.0)
Immature Granulocytes: 0 %
Lymphocytes Relative: 20 %
Lymphs Abs: 1.9 10*3/uL (ref 0.7–4.0)
MCH: 28 pg (ref 26.0–34.0)
MCHC: 30.8 g/dL (ref 30.0–36.0)
MCV: 90.9 fL (ref 80.0–100.0)
Monocytes Absolute: 1 10*3/uL (ref 0.1–1.0)
Monocytes Relative: 10 %
Neutro Abs: 6.9 10*3/uL (ref 1.7–7.7)
Neutrophils Relative %: 69 %
Platelets: 334 10*3/uL (ref 150–400)
RBC: 5.4 MIL/uL (ref 4.22–5.81)
RDW: 15.9 % — ABNORMAL HIGH (ref 11.5–15.5)
WBC: 9.9 10*3/uL (ref 4.0–10.5)
nRBC: 0 % (ref 0.0–0.2)

## 2019-04-10 LAB — COMPREHENSIVE METABOLIC PANEL
ALT: 21 U/L (ref 0–44)
AST: 30 U/L (ref 15–41)
Albumin: 4.9 g/dL (ref 3.5–5.0)
Alkaline Phosphatase: 77 U/L (ref 38–126)
Anion gap: 10 (ref 5–15)
BUN: 22 mg/dL — ABNORMAL HIGH (ref 6–20)
CO2: 23 mmol/L (ref 22–32)
Calcium: 10.5 mg/dL — ABNORMAL HIGH (ref 8.9–10.3)
Chloride: 110 mmol/L (ref 98–111)
Creatinine, Ser: 1.68 mg/dL — ABNORMAL HIGH (ref 0.61–1.24)
GFR calc Af Amer: 51 mL/min — ABNORMAL LOW (ref >60)
GFR calc non Af Amer: 44 mL/min — ABNORMAL LOW (ref >60)
Glucose, Bld: 90 mg/dL (ref 70–99)
Potassium: 5.1 mmol/L (ref 3.5–5.1)
Sodium: 143 mmol/L (ref 135–145)
Total Bilirubin: 0.8 mg/dL (ref 0.3–1.2)
Total Protein: 8.7 g/dL — ABNORMAL HIGH (ref 6.5–8.1)

## 2019-04-10 LAB — SALICYLATE LEVEL: Salicylate Lvl: <7 mg/dL (ref 2.8–30.0)

## 2019-04-10 LAB — CBG MONITORING, ED: Glucose-Capillary: 112 mg/dL — ABNORMAL HIGH (ref 70–99)

## 2019-04-10 LAB — PROTIME-INR
INR: 1.1 (ref 0.8–1.2)
Prothrombin Time: 14.3 seconds (ref 11.4–15.2)

## 2019-04-10 LAB — VALPROIC ACID LEVEL: Valproic Acid Lvl: <10 ug/mL — ABNORMAL LOW (ref 50.0–100.0)

## 2019-04-10 LAB — ACETAMINOPHEN LEVEL: Acetaminophen (Tylenol), Serum: <10 ug/mL — ABNORMAL LOW (ref 10–30)

## 2019-04-10 LAB — ETHANOL: Alcohol, Ethyl (B): <10 mg/dL (ref <10)

## 2019-04-10 MED ORDER — SODIUM CHLORIDE 0.9 % IV BOLUS
1000.0000 mL | Freq: Once | INTRAVENOUS | Status: AC
  Administered 2019-04-10: 13:00:00 1000 mL via INTRAVENOUS

## 2019-04-10 NOTE — Discharge Instructions (Addendum)
Follow up with your md if any problems °

## 2019-04-10 NOTE — ED Notes (Signed)
Unable to thoroughly complete triage due to pt decreased level of consciousness.

## 2019-04-10 NOTE — ED Notes (Signed)
Pt resting, moving left upper extremity and head.

## 2019-04-10 NOTE — ED Notes (Signed)
Pt ambulatory in room, answering questions appropriately and following commands. Baldwin [place] at bedside.

## 2019-04-10 NOTE — ED Provider Notes (Signed)
[facility]-EMERGENCY DEPT Provider Note   CSN: [id number] Arrival date & time: 04/10/19  1202     History   Chief Complaint Chief Complaint  Patient presents with  . Decreased LOC    HPI Al CorpusRonald E Twiggs is a 58 y.o. male.     Patient is a 58 year old male with past medical history of schizophrenia, COPD, and hypertension.  He is brought from [facility] for evaluation of altered level of consciousness.  From what EMS reports, it appears as though he has had less appetite and has been less active for the past several days.  Today he became unarousable and began drooling.  911 was called and patient was transported here.  Patient offers no additional history secondary to level of consciousness.  The history is provided by the patient.    Past Medical History:  Diagnosis Date  . COPD (chronic obstructive pulmonary disease) (HCC)   . Hypertension   . Schizophrenia [facility](HCC)     Patient Active Problem List   Diagnosis Date Noted  . 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 DVT (deep venous thrombosis) (HCC) 09/15/2017  . COPD (chronic obstructive pulmonary disease) (HCC) 09/15/2017  . Hypertension 09/15/2017  . Leukocytosis 09/15/2017  . Bipolar disorder (HCC) 09/15/2017    Past Surgical History:  Procedure Laterality Date  . LOWER EXTREMITY ANGIOGRAPHY N/A 09/18/2017   Procedure: LOWER EXTREMITY ANGIOGRAPHY- Recheck lysis;  Surgeon: Maeola Harmanain, Brandon Christopher, MD;  [facility];  Service: Cardiovascular;  Laterality: N/A;  . PERIPHERAL VASCULAR INTERVENTION Left 09/18/2017   Procedure: PERIPHERAL VASCULAR INTERVENTION;  Surgeon: Maeola Harmanain, Brandon Christopher, MD;  [facility];  Service: Cardiovascular;  Laterality: Left;  . THROMBECTOMY FEMORAL ARTERY Left 09/17/2017   Procedure: POSSIBLE THROMBECTOMY;  Surgeon: Maeola Harmanain, Brandon Christopher, MD;   [facility];  Service: Vascular;  Laterality: Left;  [name] Kitchen. VENOGRAM Left 09/17/2017   Procedure: ULTRASOUND POPLITEAL ACCESS; CENTRAL VENOGRAM, IVVS, LYSIS CATHETER PLACEMENT;  Surgeon: Maeola Harmanain, Brandon Christopher, MD;  [facility];  Service: Vascular;  Laterality: Left;        Home Medications    Prior to Admission medications   Medication Sig Start Date End Date Taking? Authorizing Provider  albuterol (PROVENTIL HFA;VENTOLIN HFA) 108 (90 Base) MCG/ACT inhaler Inhale 1-2 puffs into the lungs every 6 (six) hours as needed for wheezing or shortness of breath. 06/05/17   Wynetta FinesMessick, Peter C, MD  apixaban (ELIQUIS) 5 MG TABS tablet 10 mg PO BID x 7 days then 5 mg PO BID to continue 09/19/17   Pokhrel, Rebekah ChesterfieldLaxman, MD  aspirin EC 81 MG EC tablet Take 1 tablet (81 mg total) by mouth daily. 09/20/17   Pokhrel, Rebekah ChesterfieldLaxman, MD  atropine 1 % ophthalmic solution Place 2 drops under the tongue daily. For drooling    [provider]  azaTHIOprine (IMURAN) 50 MG tablet Take 50 mg by mouth 2 (two) times daily. 08/29/17   [provider]  cloZAPine (CLOZARIL) 100 MG tablet Take 300 mg by mouth 2 (two) times daily.      [provider]  cyclobenzaprine (FLEXERIL) 5 MG tablet Take 1 tablet (5 mg total) by mouth 3 (three) times daily as needed for muscle spasms. 02/14/16   Janne NapoleonNeese, Hope M, NP  diclofenac sodium (VOLTAREN) 1 % GEL Apply 2 g topically 4 (four) times daily. 02/14/16   Janne NapoleonNeese, Hope M, NP  divalproex (DEPAKOTE ER) 500  MG 24 hr tablet Take 500 mg by mouth at bedtime.     [provider]  gabapentin (NEURONTIN) 100 MG capsule Take 100 mg by mouth 2 (two) times daily.    [provider]  omeprazole (PRILOSEC) 20 MG capsule Take 20 mg by mouth daily before breakfast.     [provider]  predniSONE (DELTASONE) 10 MG tablet Take 20 mg by mouth daily. 08/29/17   [provider]  predniSONE (DELTASONE) 10 MG tablet Take 1 tablet (10 mg total) by mouth daily.  09/19/17   Pokhrel, Corrie Mckusick, MD  propranolol (INDERAL) 10 MG tablet Take 10 mg by mouth every morning.     [provider]    Family History No family history on file.  Social History Social History   Tobacco Use  . Smoking status: Current Every Day Smoker    Packs/day: 0.50    Types: Cigarettes  . Smokeless tobacco: Never Used  Substance Use Topics  . Alcohol use: No  . Drug use: Never     Allergies   Patient has no known allergies.   Review of Systems Review of Systems  Unable to perform ROS: Mental status change     Physical Exam Updated Vital Signs BP 122/82 (BP Location: Right Arm)   Pulse 85   Temp (!) 97.3 F (36.3 C) (Axillary)   Resp 16   SpO2 93%   Physical Exam Vitals signs and nursing note reviewed.  Constitutional:      General: He is not in acute distress.    Appearance: He is well-developed. He is not diaphoretic.  HENT:     Head: Normocephalic and atraumatic.  Eyes:     Comments: Pupils are constricted.  Neck:     Musculoskeletal: Normal range of motion and neck supple.  Cardiovascular:     Rate and Rhythm: Normal rate and regular rhythm.     Heart sounds: No murmur. No friction rub.  Pulmonary:     Effort: Pulmonary effort is normal. No respiratory distress.     Breath sounds: Normal breath sounds. No wheezing or rales.  Abdominal:     General: Bowel sounds are normal. There is no distension.     Palpations: Abdomen is soft.     Tenderness: There is no abdominal tenderness.  Musculoskeletal: Normal range of motion.  Skin:    General: Skin is warm and dry.  Neurological:     Mental Status: He is alert.     Coordination: Coordination normal.     Comments: Patient is somnolent.  He will briefly attempt to wake up when stimulated by loud voice or noxious stimuli.  He then becomes somnolent again.      ED Treatments / Results  Labs (all labs ordered are listed, but only abnormal results are displayed) Labs Reviewed   COMPREHENSIVE METABOLIC PANEL  ACETAMINOPHEN LEVEL  CBC WITH DIFFERENTIAL/PLATELET  SALICYLATE LEVEL  PROTIME-INR  URINALYSIS, ROUTINE W REFLEX MICROSCOPIC  RAPID URINE DRUG SCREEN, HOSP PERFORMED  VALPROIC ACID LEVEL  ETHANOL    EKG None  Radiology No results found.  Procedures Procedures (including critical care time)  Medications Ordered in ED Medications  sodium chloride 0.9 % bolus 1,000 mL (has no administration in time range)     Initial Impression / Assessment and Plan / ED Course  I have reviewed the triage vital signs and the nursing notes.  Pertinent labs & imaging results that were available during my care of the patient were reviewed by  me and considered in my medical decision making (see chart for details).  Patient is a 58 year old male with history of schizophrenia.  He is brought [facility] for evaluation of altered mental status.  I have spoken with the father who was informed me he has not been eating or drinking much for the past 2 days.  Today he went to the above facility where he became sluggish and somnolent.  I am told he was given a medication, the name of which is unknown to me prior to becoming sleepy.  Patient arrived here and would open eyes to loud voice and noxious stimuli.  His pupils were pinpoint.  Laboratory studies were obtained which are all essentially unremarkable and do not explain his altered mental status.  His CT was negative as well.  Patient has been observed for several hours in the ER.  He is now becoming somewhat more alert.  He will answer questions appropriately and communicate, so his situation seems to be improving.  Patient will be observed in the emergency department for a longer period of time.  I suspect he will continue to improve and hopefully ultimately be able to go home.  Care will be signed out to oncoming provider at shift change.  Final Clinical Impressions(s) / ED Diagnoses   Final  diagnoses:  None    ED Discharge Orders    None       Geoffery Lyons, MD 04/10/19 [phone]

## 2019-04-10 NOTE — ED Triage Notes (Signed)
Per EMS, pt is coming from the [facility] adult day[facility]. Staff called and stated the pt was found drooling and decreased LOC. Pt parents states appetite has been decreased since Monday. Family states he is usually talkative and alert. Per EMS, pt stated his medications recently changed. EMS states pt is AO x2. Pt has a hx of schizophrenia.

## 2019-06-10 NOTE — Progress Notes (Deleted)
Office Note     CC:  follow up Requesting Provider:  Fleet Contras, MD  HPI: Mark [name] is a 59 y.o. (1961-01-14) male who presents with a history of left lower extremity DVT in 2019. 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 He underwent catheter-directed lysis and mechanical thrombectomy of left common and external iliac veins with Inari clottriever as well as stent placement of the left common and external iliac veins with 18 x 90 Wallstent x2 on May 12 and 13, 2019 by Dr. Randie Heinz. His duplex study in August last year revealed no thrombus in stent.   The pt not on a statin for cholesterol management.  The pt is on a daily aspirin.   Other AC:  Eliquis The pt propranolol on  for hypertension.   The pt is not diabetic.   Tobacco hx:  current smoker  Past Medical History:  Diagnosis Date  . COPD (chronic obstructive pulmonary disease) (HCC)   . Hypertension   . Schizophrenia [facility])     Past Surgical History:  Procedure Laterality Date  . LOWER EXTREMITY ANGIOGRAPHY N/A 09/18/2017   Procedure: LOWER EXTREMITY ANGIOGRAPHY- Recheck lysis;  Surgeon: Maeola Harman, MD;  Location: [facility] INVASIVE CV LAB;  Service: Cardiovascular;  Laterality: N/A;  . PERIPHERAL VASCULAR INTERVENTION Left 09/18/2017   Procedure: PERIPHERAL VASCULAR INTERVENTION;  Surgeon: Maeola Harman, MD;  Location[facility] INVASIVE CV LAB;  Service: Cardiovascular;  Laterality: Left;  . THROMBECTOMY FEMORAL ARTERY Left 09/17/2017   Procedure: POSSIBLE THROMBECTOMY;  Surgeon: Maeola Harman, MD;  Location: [facility] OR;  Service: Vascular;  Laterality: Left;  [name] Kitchen VENOGRAM Left 09/17/2017   Procedure: ULTRASOUND POPLITEAL ACCESS; CENTRAL VENOGRAM, IVVS, LYSIS CATHETER PLACEMENT;  Surgeon: Maeola Harman, MD;  Location: [facility] OR;  Service: Vascular;  Laterality: Left;    Social History   Socioeconomic History  . Marital status: Single    Spouse name: Not on file  . Number of  children: Not on file  . Years of education: Not on file  . Highest education level: Not on file  Occupational History  . Not on file  Tobacco Use  . Smoking status: Current Every Day Smoker    Packs/day: 0.50    Types: Cigarettes  . Smokeless tobacco: Never Used  Substance and Sexual Activity  . Alcohol use: No  . Drug use: Never  . Sexual activity: Not on file  Other Topics Concern  . Not on file  Social History Narrative  . Not on file   Social Determinants of Health   Financial Resource Strain:   . Difficulty of Paying Living Expenses: Not on file  Food Insecurity:   . Worried About Programme researcher, broadcasting/film/video in the Last Year: Not on file  . Ran Out of Food in the Last Year: Not on file  Transportation Needs:   . Lack of Transportation (Medical): Not on file  . Lack of Transportation (Non-Medical): Not on file  Physical Activity:   . Days of Exercise per Week: Not on file  . Minutes of Exercise per Session: Not on file  Stress:   . Feeling of Stress : Not on file  Social Connections:   . Frequency of Communication with Friends and Family: Not on file  . Frequency of Social Gatherings with Friends and Family: Not on file  . Attends Religious Services: Not on file  . Active Member of Clubs or Organizations: Not on file  . Attends Banker Meetings: Not on  file  . Marital Status: Not on file  Intimate Partner Violence:   . Fear of Current or Ex-Partner: Not on file  . Emotionally Abused: Not on file  . Physically Abused: Not on file  . Sexually Abused: Not on file   ***No family history on file.  Current Outpatient Medications  Medication Sig Dispense Refill  . albuterol (PROVENTIL HFA;VENTOLIN HFA) 108 (90 Base) MCG/ACT inhaler Inhale 1-2 puffs into the lungs every 6 (six) hours as needed for wheezing or shortness of breath. 1 Inhaler 0  . apixaban (ELIQUIS) 5 MG TABS tablet 10 mg PO BID x 7 days then 5 mg PO BID to continue 60 tablet 3  . aspirin EC 81 MG  EC tablet Take 1 tablet (81 mg total) by mouth daily. 30 tablet 2  . atropine 1 % ophthalmic solution Place 2 drops under the tongue daily. For drooling    . azaTHIOprine (IMURAN) 50 MG tablet Take 50 mg by mouth 2 (two) times daily.    . cloZAPine (CLOZARIL) 100 MG tablet Take 300 mg by mouth 2 (two) times daily.      . cyclobenzaprine (FLEXERIL) 5 MG tablet Take 1 tablet (5 mg total) by mouth 3 (three) times daily as needed for muscle spasms. 30 tablet 0  . diclofenac sodium (VOLTAREN) 1 % GEL Apply 2 g topically 4 (four) times daily. 1 Tube 0  . divalproex (DEPAKOTE ER) 500 MG 24 hr tablet Take 500 mg by mouth at bedtime.     . divalproex (DEPAKOTE) 250 MG DR tablet Take 250 mg by mouth 2 (two) times daily.    [name] Kitchen gabapentin (NEURONTIN) 100 MG capsule Take 100 mg by mouth 2 (two) times daily.    [name] Kitchen omeprazole (PRILOSEC) 20 MG capsule Take 20 mg by mouth daily before breakfast.     . predniSONE (DELTASONE) 10 MG tablet Take 20 mg by mouth daily.    . predniSONE (DELTASONE) 10 MG tablet Take 1 tablet (10 mg total) by mouth daily.    . propranolol (INDERAL) 10 MG tablet Take 10 mg by mouth every morning.      No current facility-administered medications for this visit.    No Known Allergies   REVIEW OF SYSTEMS:  *** [X]  denotes positive finding, [ ]  denotes negative finding Cardiac  Comments:  Chest pain or chest pressure:    Shortness of breath upon exertion:    Short of breath when lying flat:    Irregular heart rhythm:        Vascular    Pain in calf, thigh, or hip brought on by ambulation:    Pain in feet at night that wakes you up from your sleep:     Blood clot in your veins:    Leg swelling:         Pulmonary    Oxygen at home:    Productive cough:     Wheezing:         Neurologic    Sudden weakness in arms or legs:     Sudden numbness in arms or legs:     Sudden onset of difficulty speaking or slurred speech:    Temporary loss of vision in one eye:     Problems with  dizziness:         Gastrointestinal    Blood in stool:     Vomited blood:         Genitourinary    Burning when urinating:  Blood in urine:        Psychiatric    Major depression:         Hematologic    Bleeding problems:    Problems with blood clotting too easily:        Skin    Rashes or ulcers:        Constitutional    Fever or chills:     August 2020 Summary:  IVC/Iliac: No evidence of thrombus in the left external Iliac vein.  Visualization of proximal Inferior Vena Cava, distal Inferior Vena Cava,  mid inferior vena cava, proximal common Iliac, mid common Iliac and distal  common Iliac was limited.   PHYSICAL EXAMINATION:  There were no vitals filed for this visit.  General:  WDWN in NAD; vital signs documented above Gait: Not observed HENT: WNL, normocephalic Pulmonary: normal non-labored breathing , without Rales, rhonchi,  wheezing Cardiac: {Desc; regular/irreg:14544} HR, without  Murmurs {With/Without:20273} carotid bruit*** Abdomen: soft, NT, no masses Skin: {With/Without:20273} rashes Vascular Exam/Pulses:  Right Left  Radial {Exam; arterial pulse strength 0-4:30167} {Exam; arterial pulse strength 0-4:30167}  Ulnar {Exam; arterial pulse strength 0-4:30167} {Exam; arterial pulse strength 0-4:30167}  Femoral {Exam; arterial pulse strength 0-4:30167} {Exam; arterial pulse strength 0-4:30167}  Popliteal {Exam; arterial pulse strength 0-4:30167} {Exam; arterial pulse strength 0-4:30167}  DP {Exam; arterial pulse strength 0-4:30167} {Exam; arterial pulse strength 0-4:30167}  PT {Exam; arterial pulse strength 0-4:30167} {Exam; arterial pulse strength 0-4:30167}   Extremities: {With/Without:20273} ischemic changes, {With/Without:20273} Gangrene , {With/Without:20273} cellulitis; {With/Without:20273} open wounds;  Musculoskeletal: no muscle wasting or atrophy  Neurologic: A&O X 3;  No focal weakness or paresthesias are detected Psychiatric:  The pt has  {Desc; normal/abnormal:11317::"Normal"} affect.   Non-Invasive Vascular Imaging:   ***    ASSESSMENT/PLAN:: 59 y.o. male here for follow up for ***   -***   Milinda Antis, PA-C [facility] [phone]  Clinic MD:   *** This encounter was created in error - please disregard.

## 2019-06-12 ENCOUNTER — Other Ambulatory Visit: Payer: Self-pay

## 2019-06-12 DIAGNOSIS — I82401 Acute embolism and thrombosis of unspecified deep veins of right lower extremity: Secondary | ICD-10-CM

## 2019-06-13 ENCOUNTER — Encounter: Payer: Medicaid Other | Admitting: Physician Assistant

## 2019-06-13 ENCOUNTER — Ambulatory Visit (HOSPITAL_COMMUNITY)
Admission: RE | Admit: 2019-06-13 | Discharge: 2019-06-13 | Disposition: A | Discharge status: Home / Self Care | Payer: Medicaid Other | Source: Ambulatory Visit | Attending: Vascular Surgery | Admitting: Vascular Surgery

## 2019-06-13 ENCOUNTER — Other Ambulatory Visit: Payer: Self-pay

## 2019-06-13 DIAGNOSIS — I82401 Acute embolism and thrombosis of unspecified deep veins of right lower extremity: Secondary | ICD-10-CM

## 2019-06-17 NOTE — Progress Notes (Signed)
HISTORY AND PHYSICAL     CC:  follow up. Requesting Provider:  Fleet Contras, MD  HPI: This is a 59 y.o. male who is here today for follow up.  He previously underwent stenting of his left CIV in May 2019 by Dr. Randie Heinz for retroperitoneal fibrosis with extensive LLE DVT.  He had not been compliant with compression stockings given that he lives in a group home and has schizophrenia.     He was last seen on 12/14/2018 by NP and at that time, he was not wearing his compression stockings.  He did not have any swelling in his LLE and no skin changes. His duplex revealed no evidence of thrombus in the left EIV.  Visualization of the proximal, distal and mid IVC, proximal, mid and distal CIV was limited due to bowel gas.  He was still smoking and was counseled on smoking cessation.  He was scheduled to f/u in 6 months with duplex and advised to continue Eliquis.    The pt returns today for follow up.  I also spoke with his dad via telephone.  He states he is doing well.  He has not had any issues with leg swelling, shortness of breath or chest pain.  According to his dad, he continues to take his Eliquis.  He does continue to smoke but states he got a pamphlet from his doctor about smoking cessation and is willing to think about quitting.    The pt is not on a statin for cholesterol management.    The pt is on an aspirin.    Other AC:  Eliquis The pt is not on meds for hypertension.  The pt does not have diabetes. Tobacco hx:  current   Past Medical History:  Diagnosis Date  . COPD (chronic obstructive pulmonary disease) (HCC)   . Hypertension   . Schizophrenia [facility])     Past Surgical History:  Procedure Laterality Date  . LOWER EXTREMITY ANGIOGRAPHY N/A 09/18/2017   Procedure: LOWER EXTREMITY ANGIOGRAPHY- Recheck lysis;  Surgeon: Maeola Harman, MD;  Location: [facility] INVASIVE CV LAB;  Service: Cardiovascular;  Laterality: N/A;  . PERIPHERAL VASCULAR INTERVENTION Left 09/18/2017   Procedure: PERIPHERAL VASCULAR INTERVENTION;  Surgeon: Maeola Harman, MD;  Location: [facility] INVASIVE CV LAB;  Service: Cardiovascular;  Laterality: Left;  . THROMBECTOMY FEMORAL ARTERY Left 09/17/2017   Procedure: POSSIBLE THROMBECTOMY;  Surgeon: Maeola Harman, MD;  Location: [facility] OR;  Service: Vascular;  Laterality: Left;  [name] Kitchen VENOGRAM Left 09/17/2017   Procedure: ULTRASOUND POPLITEAL ACCESS; CENTRAL VENOGRAM, IVVS, LYSIS CATHETER PLACEMENT;  Surgeon: Maeola Harman, MD;  Location: [facility] OR;  Service: Vascular;  Laterality: Left;    No Known Allergies  Current Outpatient Medications  Medication Sig Dispense Refill  . albuterol (PROVENTIL HFA;VENTOLIN HFA) 108 (90 Base) MCG/ACT inhaler Inhale 1-2 puffs into the lungs every 6 (six) hours as needed for wheezing or shortness of breath. 1 Inhaler 0  . apixaban (ELIQUIS) 5 MG TABS tablet 10 mg PO BID x 7 days then 5 mg PO BID to continue 60 tablet 3  . aspirin EC 81 MG EC tablet Take 1 tablet (81 mg total) by mouth daily. 30 tablet 2  . atropine 1 % ophthalmic solution Place 2 drops under the tongue daily. For drooling    . azaTHIOprine (IMURAN) 50 MG tablet Take 50 mg by mouth 2 (two) times daily.    . cloZAPine (CLOZARIL) 100 MG tablet Take 300 mg by mouth 2 (two) times daily.      [name] Kitchen  cyclobenzaprine (FLEXERIL) 5 MG tablet Take 1 tablet (5 mg total) by mouth 3 (three) times daily as needed for muscle spasms. 30 tablet 0  . diclofenac sodium (VOLTAREN) 1 % GEL Apply 2 g topically 4 (four) times daily. 1 Tube 0  . divalproex (DEPAKOTE ER) 500 MG 24 hr tablet Take 500 mg by mouth at bedtime.     . divalproex (DEPAKOTE) 250 MG DR tablet Take 250 mg by mouth 2 (two) times daily.    [name] Kitchen gabapentin (NEURONTIN) 100 MG capsule Take 100 mg by mouth 2 (two) times daily.    [name] Kitchen omeprazole (PRILOSEC) 20 MG capsule Take 20 mg by mouth daily before breakfast.     . predniSONE (DELTASONE) 10 MG tablet Take 20 mg by mouth daily.    . predniSONE  (DELTASONE) 10 MG tablet Take 1 tablet (10 mg total) by mouth daily.    . propranolol (INDERAL) 10 MG tablet Take 10 mg by mouth every morning.      No current facility-administered medications for this visit.    No family history on file.  Social History   Socioeconomic History  . Marital status: Single    Spouse name: Not on file  . Number of children: Not on file  . Years of education: Not on file  . Highest education level: Not on file  Occupational History  . Not on file  Tobacco Use  . Smoking status: Current Every Day Smoker    Packs/day: 0.50    Types: Cigarettes  . Smokeless tobacco: Never Used  Substance and Sexual Activity  . Alcohol use: No  . Drug use: Never  . Sexual activity: Not on file  Other Topics Concern  . Not on file  Social History Narrative  . Not on file   Social Determinants of Health   Financial Resource Strain:   . Difficulty of Paying Living Expenses: Not on file  Food Insecurity:   . Worried About Programme researcher, broadcasting/film/video in the Last Year: Not on file  . Ran Out of Food in the Last Year: Not on file  Transportation Needs:   . Lack of Transportation (Medical): Not on file  . Lack of Transportation (Non-Medical): Not on file  Physical Activity:   . Days of Exercise per Week: Not on file  . Minutes of Exercise per Session: Not on file  Stress:   . Feeling of Stress : Not on file  Social Connections:   . Frequency of Communication with Friends and Family: Not on file  . Frequency of Social Gatherings with Friends and Family: Not on file  . Attends Religious Services: Not on file  . Active Member of Clubs or Organizations: Not on file  . Attends Banker Meetings: Not on file  . Marital Status: Not on file  Intimate Partner Violence:   . Fear of Current or Ex-Partner: Not on file  . Emotionally Abused: Not on file  . Physically Abused: Not on file  . Sexually Abused: Not on file     REVIEW OF SYSTEMS:   [X]  denotes  positive finding, [ ]  denotes negative finding Cardiac  Comments:  Chest pain or chest pressure:    Shortness of breath upon exertion:    Short of breath when lying flat:    Irregular heart rhythm:        Vascular    Pain in calf, thigh, or hip brought on by ambulation:    Pain in feet at night that  wakes you up from your sleep:     Blood clot in your veins:    Leg swelling:         Pulmonary    Oxygen at home:    Productive cough:     Wheezing:         Neurologic    Sudden weakness in arms or legs:     Sudden numbness in arms or legs:     Sudden onset of difficulty speaking or slurred speech:    Temporary loss of vision in one eye:     Problems with dizziness:         Gastrointestinal    Blood in stool:     Vomited blood:         Genitourinary    Burning when urinating:     Blood in urine:        Psychiatric    Major depression:         Hematologic    Bleeding problems:    Problems with blood clotting too easily:        Skin    Rashes or ulcers:        Constitutional    Fever or chills:      PHYSICAL EXAMINATION:  Today's Vitals   06/19/19 1116  BP: 122/77  Pulse: 74  Resp: 16  Temp: (!) 97.5 F (36.4 C)  TempSrc: Temporal  SpO2: 96%  Weight: 215 lb (97.5 kg)  Height: 5\' 11"  (1.803 m)   Body mass index is 29.99 kg/m.   General:  WDWN in NAD; vital signs documented above Gait: Not observed HENT: WNL, normocephalic Pulmonary: normal non-labored breathing , without Rales, rhonchi,  wheezing Cardiac: regular HR, without  Murmurs; without carotid bruits Abdomen: soft, NT, no masses Skin: without rashes Vascular Exam/Pulses:  Right Left  Radial 2+ (normal) 2+ (normal)  DP 2+ (normal) 2+ (normal)  PT 1+ (weak) Unable to palpate    Extremities: without ischemic changes, without Gangrene , without cellulitis; without open wounds; calves are soft; no leg swelling BLE Musculoskeletal: no muscle wasting or atrophy  Neurologic: A&O X 3;  No focal  weakness or paresthesias are detected Psychiatric:  The pt has Normal affect.   Non-Invasive Vascular Imaging:   IVC/left iliac venous duplex 06/13/2019: Summary:  IVC/Iliac: The left common iliac and external iliac stents appear patent.  Unable to visualize the proximal and mid IVC . Limited visualization of  the distal IVC.  Venous IVC/left iliac duplex 12/14/2018: Summary:  IVC/Iliac: No evidence of thrombus in the left external Iliac vein.  Visualization of proximal Inferior Vena Cava, distal Inferior Vena Cava,  mid inferior vena cava, proximal common Iliac, mid common Iliac and distal common Iliac was limited.   ASSESSMENT/PLAN:: 59 y.o. male here for follow up for follow up for stenting of his left CIV in May 2019 by Dr. June 2019 for retroperitoneal fibrosis with extensive LLE DVT  -on venous duplex on 06/13/2019, it reveals his CIV left is patent.  Discussed with Dr. 08/11/2019 and since pt is not having any issues with the Eliquis, we will keep him on this.   -he will return in 6 months with IVC/iliac duplex on the left and see Dr. Darrick Penna.   -discussed smoking cessation and its importance.  He recently got a pamphlet about smoking cessation.  -a podiatry referral is being made for routine foot care. -he will call Randie Heinz sooner should he have any issues prior to his next visit.  -  answered his and his father's questions.    Leontine Locket, PA-C [facility] [phone]  Clinic MD:   Oneida Alar

## 2019-06-19 ENCOUNTER — Other Ambulatory Visit: Payer: Self-pay

## 2019-06-19 ENCOUNTER — Ambulatory Visit (INDEPENDENT_AMBULATORY_CARE_PROVIDER_SITE_OTHER): Payer: Medicaid Other | Admitting: Physician Assistant

## 2019-06-19 VITALS — BP 122/77 | HR 74 | Temp 97.5°F | Resp 16 | Ht 71.0 in | Wt 215.0 lb

## 2019-06-19 DIAGNOSIS — I82401 Acute embolism and thrombosis of unspecified deep veins of right lower extremity: Secondary | ICD-10-CM | POA: Diagnosis not present

## 2019-06-20 ENCOUNTER — Other Ambulatory Visit: Payer: Self-pay | Admitting: *Deleted

## 2019-06-20 DIAGNOSIS — Z86718 Personal history of other venous thrombosis and embolism: Secondary | ICD-10-CM

## 2019-07-01 ENCOUNTER — Ambulatory Visit: Payer: Medicaid Other | Admitting: Podiatry

## 2019-07-01 ENCOUNTER — Other Ambulatory Visit: Payer: Self-pay

## 2019-07-01 ENCOUNTER — Encounter: Payer: Self-pay | Admitting: Podiatry

## 2019-07-01 VITALS — BP 152/98 | HR 84

## 2019-07-01 DIAGNOSIS — M79675 Pain in left toe(s): Secondary | ICD-10-CM | POA: Diagnosis not present

## 2019-07-01 DIAGNOSIS — B351 Tinea unguium: Secondary | ICD-10-CM

## 2019-07-01 DIAGNOSIS — M79674 Pain in right toe(s): Secondary | ICD-10-CM

## 2019-07-01 MED ORDER — CICLOPIROX 8 % EX SOLN: CUTANEOUS | 11 refills | Status: DC

## 2019-07-01 NOTE — Progress Notes (Signed)
Subjective: Mark [name] presents today referred by Fleet Contras, MD for complaint of painful mycotic nails b/l that are difficult to trim. Duration greater than six months. Pain interferes with ambulation. Aggravating factors include wearing enclosed shoe gear. Pain is relieved with periodic professional debridement.   Mark [name] is accompanied by his father on today's visit. Father relates some of patient's history.  Past Medical History:  Diagnosis Date  . COPD (chronic obstructive pulmonary disease) (HCC)   . Hypertension   . Schizophrenia [facility])      Patient Active Problem List   Diagnosis Date Noted  . History of adenomatous polyp of colon 11/04/2018  . Schizophrenia (HCC) 11/04/2018  . 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 DVT (deep venous thrombosis) (HCC) 09/15/2017  . COPD (chronic obstructive pulmonary disease) (HCC) 09/15/2017  . Hypertension 09/15/2017  . Leukocytosis 09/15/2017  . Bipolar disorder (HCC) 09/15/2017  . Drug therapy 06/21/2017  . 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     Past Surgical History:  Procedure Laterality Date  . LOWER EXTREMITY ANGIOGRAPHY N/A 09/18/2017   Procedure: LOWER EXTREMITY ANGIOGRAPHY- Recheck lysis;  Surgeon: Maeola Harman, MD;  Location: [facility] INVASIVE CV LAB;  Service: Cardiovascular;  Laterality: N/A;  . PERIPHERAL VASCULAR INTERVENTION Left 09/18/2017   Procedure: PERIPHERAL VASCULAR INTERVENTION;  Surgeon: Maeola Harman, MD;  Location: [facility] INVASIVE CV LAB;  Service: Cardiovascular;  Laterality: Left;  . THROMBECTOMY FEMORAL ARTERY Left 09/17/2017   Procedure: POSSIBLE THROMBECTOMY;  Surgeon: Maeola Harman, MD;  Location: [facility] OR;  Service: Vascular;  Laterality: Left;  [name] Kitchen VENOGRAM Left 09/17/2017   Procedure: ULTRASOUND POPLITEAL ACCESS; CENTRAL VENOGRAM, IVVS, LYSIS CATHETER PLACEMENT;  Surgeon: Maeola Harman, MD;   Location: [facility] OR;  Service: Vascular;  Laterality: Left;     Current Outpatient Medications on File Prior to Visit  Medication Sig Dispense Refill  . Oxymetazoline HCl (NASAL SPRAY) 0.05 % SOLN Place into the nose.    . albuterol (PROVENTIL HFA;VENTOLIN HFA) 108 (90 Base) MCG/ACT inhaler Inhale 1-2 puffs into the lungs every 6 (six) hours as needed for wheezing or shortness of breath. 1 Inhaler 0  . apixaban (ELIQUIS) 5 MG TABS tablet 10 mg PO BID x 7 days then 5 mg PO BID to continue 60 tablet 3  . aspirin EC 81 MG EC tablet Take 1 tablet (81 mg total) by mouth daily. 30 tablet 2  . atropine 1 % ophthalmic solution Place 2 drops under the tongue daily. For drooling    . azaTHIOprine (IMURAN) 50 MG tablet Take 50 mg by mouth 2 (two) times daily.    . cloZAPine (CLOZARIL) 100 MG tablet Take 300 mg by mouth 2 (two) times daily.      . cyclobenzaprine (FLEXERIL) 5 MG tablet Take 1 tablet (5 mg total) by mouth 3 (three) times daily as needed for muscle spasms. 30 tablet 0  . diclofenac sodium (VOLTAREN) 1 % GEL Apply 2 g topically 4 (four) times daily. 1 Tube 0  . divalproex (DEPAKOTE ER) 500 MG 24 hr tablet Take 500 mg by mouth at bedtime.     . divalproex (DEPAKOTE) 250 MG DR tablet Take 250 mg by mouth 2 (two) times daily.    [name] Kitchen gabapentin (NEURONTIN) 100 MG capsule Take 100 mg by mouth 2 (two) times daily.    [name] Kitchen omeprazole (PRILOSEC) 20 MG capsule Take 20 mg by mouth daily before breakfast.     . predniSONE (DELTASONE)  10 MG tablet Take 20 mg by mouth daily.    . predniSONE (DELTASONE) 10 MG tablet Take 1 tablet (10 mg total) by mouth daily.    . propranolol (INDERAL) 10 MG tablet Take 10 mg by mouth every morning.      No current facility-administered medications on file prior to visit.     No Known Allergies   Social History   Occupational History  . Not on file  Tobacco Use  . Smoking status: Current Every Day Smoker    Packs/day: 0.50    Types: Cigarettes  . Smokeless tobacco:  Never Used  Substance and Sexual Activity  . Alcohol use: No  . Drug use: Never  . Sexual activity: Not on file     History reviewed. No pertinent family history.    There is no immunization history on file for this patient.   Objective: Vitals:   07/01/19 1536  BP: (!) 152/98  Pulse: 84    Vascular Examination:  Capillary refill time to digits immediate b/l, palpable DP pulses b/l, palpable PT pulses b/l, pedal hair sparse b/l and skin temperature gradient within normal limits b/l  Dermatological Examination: Pedal skin with normal turgor, texture and tone bilaterally, no open wounds bilaterally, no interdigital macerations bilaterally and toenails 1-5 b/l elongated, dystrophic, thickened, crumbly with subungual debris  Musculoskeletal: Normal muscle strength 5/5 to all lower extremity muscle groups bilaterally and no pain crepitus or joint limitation noted with ROM b/l  Neurological: Protective sensation intact 5/5 intact bilaterally with 10g monofilament b/l and vibratory sensation intact b/l  Assessment: 1. Pain due to onychomycosis of toenails of both feet     Plan: -Discussed treatment options for onychomycosis. Rx written for Penlac Nail Lacquer 8% to be applied to affected toenails once daily for 48 weeks and removed once weekly with nail polis remover.Toenails 1-5 b/l were debrided in length and girth with sterile nail nippers and dremel without iatrogenic bleeding. -Patient to continue soft, supportive shoe gear daily. -Patient to report any pedal injuries to medical professional immediately. -Patient/POA to call should there be question/concern in the interim.  Return if symptoms worsen or fail to improve, for nail trim.ci

## 2019-07-01 NOTE — Patient Instructions (Signed)

## 2019-08-31 ENCOUNTER — Ambulatory Visit: Payer: Medicaid Other | Attending: Internal Medicine

## 2019-08-31 DIAGNOSIS — Z23 Encounter for immunization: Secondary | ICD-10-CM

## 2019-08-31 NOTE — Progress Notes (Signed)
   Covid-19 Vaccination Clinic  Name:  Mark [name]    MRN: [id number] DOB: 1960-10-19  08/31/2019  [name] was observed post Covid-19 immunization for 15 minutes without incident. He was provided with Vaccine Information Sheet and instruction to access the V-Safe system.   [name] was instructed to call 911 with any severe reactions post vaccine: [name] Kitchen Difficulty breathing  . Swelling of face and throat  . A fast heartbeat  . A bad rash all over body  . Dizziness and weakness   Immunizations Administered    Name Date Dose VIS Date Route   Pfizer COVID-19 Vaccine 08/31/2019 10:22 AM 0.3 mL 07/03/2018 Intramuscular   Manufacturer: [employer], [employer]   Lot: [id number]   NDC: [address]-8

## 2019-09-23 ENCOUNTER — Ambulatory Visit: Payer: Medicaid Other

## 2020-02-26 DIAGNOSIS — J449 Chronic obstructive pulmonary disease, unspecified: Secondary | ICD-10-CM | POA: Diagnosis not present

## 2020-02-26 DIAGNOSIS — Z7982 Long term (current) use of aspirin: Secondary | ICD-10-CM | POA: Insufficient documentation

## 2020-02-26 DIAGNOSIS — F17210 Nicotine dependence, cigarettes, uncomplicated: Secondary | ICD-10-CM | POA: Insufficient documentation

## 2020-02-26 DIAGNOSIS — Z7901 Long term (current) use of anticoagulants: Secondary | ICD-10-CM | POA: Insufficient documentation

## 2020-02-26 DIAGNOSIS — R451 Restlessness and agitation: Secondary | ICD-10-CM | POA: Diagnosis present

## 2020-02-26 DIAGNOSIS — Z760 Encounter for issue of repeat prescription: Secondary | ICD-10-CM | POA: Insufficient documentation

## 2020-02-26 DIAGNOSIS — I10 Essential (primary) hypertension: Secondary | ICD-10-CM | POA: Insufficient documentation

## 2020-02-26 DIAGNOSIS — Z79899 Other long term (current) drug therapy: Secondary | ICD-10-CM | POA: Insufficient documentation

## 2020-02-27 ENCOUNTER — Encounter (HOSPITAL_COMMUNITY): Payer: Self-pay

## 2020-02-27 ENCOUNTER — Emergency Department (HOSPITAL_COMMUNITY)
Admission: EM | Admit: 2020-02-27 | Discharge: 2020-02-27 | Disposition: A | Discharge status: Home / Self Care | Payer: Medicaid Other | Attending: Emergency Medicine | Admitting: Emergency Medicine

## 2020-02-27 ENCOUNTER — Other Ambulatory Visit: Payer: Self-pay

## 2020-02-27 ENCOUNTER — Emergency Department (HOSPITAL_COMMUNITY): Payer: Medicaid Other

## 2020-02-27 DIAGNOSIS — Z760 Encounter for issue of repeat prescription: Secondary | ICD-10-CM

## 2020-02-27 LAB — COMPREHENSIVE METABOLIC PANEL
ALT: 28 U/L (ref 0–44)
AST: 43 U/L — ABNORMAL HIGH (ref 15–41)
Albumin: 4.2 g/dL (ref 3.5–5.0)
Alkaline Phosphatase: 75 U/L (ref 38–126)
Anion gap: 21 — ABNORMAL HIGH (ref 5–15)
BUN: 12 mg/dL (ref 6–20)
CO2: 16 mmol/L — ABNORMAL LOW (ref 22–32)
Calcium: 10.5 mg/dL — ABNORMAL HIGH (ref 8.9–10.3)
Chloride: 102 mmol/L (ref 98–111)
Creatinine, Ser: 1.09 mg/dL (ref 0.61–1.24)
GFR, Estimated: >60 mL/min (ref >60)
Glucose, Bld: 96 mg/dL (ref 70–99)
Potassium: 3.3 mmol/L — ABNORMAL LOW (ref 3.5–5.1)
Sodium: 139 mmol/L (ref 135–145)
Total Bilirubin: 1.8 mg/dL — ABNORMAL HIGH (ref 0.3–1.2)
Total Protein: 7.7 g/dL (ref 6.5–8.1)

## 2020-02-27 LAB — CBC
HCT: 47.6 % (ref 39.0–52.0)
Hemoglobin: 14.2 g/dL (ref 13.0–17.0)
MCH: 29 pg (ref 26.0–34.0)
MCHC: 29.8 g/dL — ABNORMAL LOW (ref 30.0–36.0)
MCV: 97.3 fL (ref 80.0–100.0)
Platelets: 316 10*3/uL (ref 150–400)
RBC: 4.89 MIL/uL (ref 4.22–5.81)
RDW: 15 % (ref 11.5–15.5)
WBC: 7.7 10*3/uL (ref 4.0–10.5)
nRBC: 0 % (ref 0.0–0.2)

## 2020-02-27 LAB — ACETAMINOPHEN LEVEL: Acetaminophen (Tylenol), Serum: <10 ug/mL — ABNORMAL LOW (ref 10–30)

## 2020-02-27 LAB — SALICYLATE LEVEL: Salicylate Lvl: <7 mg/dL — ABNORMAL LOW (ref 7.0–30.0)

## 2020-02-27 LAB — CBG MONITORING, ED: Glucose-Capillary: 110 mg/dL — ABNORMAL HIGH (ref 70–99)

## 2020-02-27 LAB — VALPROIC ACID LEVEL: Valproic Acid Lvl: <10 ug/mL — ABNORMAL LOW (ref 50.0–100.0)

## 2020-02-27 LAB — ETHANOL: Alcohol, Ethyl (B): <10 mg/dL (ref <10)

## 2020-02-27 MED ORDER — ONDANSETRON 4 MG PO TBDP: 4.0000 mg | ORAL_TABLET | Freq: Once | ORAL | Status: DC

## 2020-02-27 MED ORDER — DIVALPROEX SODIUM ER 500 MG PO TB24
500.0000 mg | ORAL_TABLET | Freq: Once | ORAL | Status: AC
  Administered 2020-02-27: 500 mg via ORAL
  Filled 2020-02-27: qty 1

## 2020-02-27 MED ORDER — CLOZAPINE 100 MG PO TABS: 100.0000 mg | ORAL_TABLET | Freq: Every day | ORAL | 0 refills | Status: DC

## 2020-02-27 MED ORDER — DIVALPROEX SODIUM ER 500 MG PO TB24: 500.0000 mg | ORAL_TABLET | Freq: Every day | ORAL | 0 refills | Status: DC

## 2020-02-27 MED ORDER — CLOZAPINE 100 MG PO TABS
300.0000 mg | ORAL_TABLET | Freq: Once | ORAL | Status: AC
  Administered 2020-02-27: 300 mg via ORAL
  Filled 2020-02-27: qty 3

## 2020-02-27 MED ORDER — PROPRANOLOL HCL 20 MG PO TABS
10.0000 mg | ORAL_TABLET | Freq: Once | ORAL | Status: AC
  Administered 2020-02-27: 10 mg via ORAL
  Filled 2020-02-27: qty 1

## 2020-02-27 MED ORDER — PROPRANOLOL HCL 10 MG PO TABS
10.0000 mg | ORAL_TABLET | Freq: Every day | ORAL | 0 refills | Status: DC
Start: 2020-02-27 — End: 2021-07-07

## 2020-02-27 MED ORDER — DIVALPROEX SODIUM 250 MG PO DR TAB
250.0000 mg | DELAYED_RELEASE_TABLET | Freq: Two times a day (BID) | ORAL | 0 refills | Status: DC
Start: 2020-02-27 — End: 2021-06-28

## 2020-02-27 MED ORDER — ONDANSETRON HCL 4 MG/2ML IJ SOLN
4.0000 mg | Freq: Once | INTRAMUSCULAR | Status: AC
  Administered 2020-02-27: 4 mg via INTRAVENOUS
  Filled 2020-02-27: qty 2

## 2020-02-27 MED ORDER — SODIUM CHLORIDE 0.9 % IV BOLUS
1000.0000 mL | Freq: Once | INTRAVENOUS | Status: AC
  Administered 2020-02-27: 1000 mL via INTRAVENOUS

## 2020-02-27 NOTE — ED Triage Notes (Signed)
Patient arrived via [facility] stating he ran out of his medication Monday. Patient agitated on arrival. Declines SI or HI.

## 2020-02-27 NOTE — ED Provider Notes (Addendum)
[facility]-EMERGENCY DEPT Provider Note   CSN: [id number] Arrival date & time: 02/26/20  2358     History Chief Complaint  Patient presents with  . Medication Refill    Mark [name] is a 59 y.o. male.  The history is provided by the patient and medical records.  Medication Refill   59 year old male with history of hypertension, COPD, schizophrenia, presenting to the ED with agitation.  States he ran out of his medication Monday and since then has felt very jittery and unsettled.  He is having trouble sleeping.  He states his prescription ran out and he has not had time to get a new one yet.  He denies any suicidal homicidal ideation.  No hallucinations.  He states he wants refills of his medications.  Past Medical History:  Diagnosis Date  . COPD (chronic obstructive pulmonary disease) (HCC)   . Hypertension   . Schizophrenia [facility](HCC)     Patient Active Problem List   Diagnosis Date Noted  . History of adenomatous polyp of colon 11/04/2018  . Schizophrenia (HCC) 11/04/2018  . 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 DVT (deep venous thrombosis) (HCC) 09/15/2017  . COPD (chronic obstructive pulmonary disease) (HCC) 09/15/2017  . Hypertension 09/15/2017  . Leukocytosis 09/15/2017  . Bipolar disorder (HCC) 09/15/2017  . Drug therapy 06/21/2017  . 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 LOWER EXTREMITY ANGIOGRAPHY N/A 09/18/2017   Procedure: LOWER EXTREMITY ANGIOGRAPHY- Recheck lysis;  Surgeon: Maeola Harmanain, Brandon Christopher, MD;  [facility];  Service: Cardiovascular;  Laterality: N/A;  . PERIPHERAL VASCULAR INTERVENTION Left 09/18/2017   Procedure: PERIPHERAL VASCULAR INTERVENTION;  Surgeon: Maeola Harmanain, Brandon Christopher, MD;  [facility];  Service: Cardiovascular;  Laterality: Left;  . THROMBECTOMY FEMORAL ARTERY  Left 09/17/2017   Procedure: POSSIBLE THROMBECTOMY;  Surgeon: Maeola Harmanain, Brandon Christopher, MD;  Location: [facility] OR;  Service: Vascular;  Laterality: Left;  [name] Kitchen. VENOGRAM Left 09/17/2017   Procedure: ULTRASOUND POPLITEAL ACCESS; CENTRAL VENOGRAM, IVVS, LYSIS CATHETER PLACEMENT;  Surgeon: Maeola Harmanain, Brandon Christopher, MD;  [facility];  Service: Vascular;  Laterality: Left;       No family history on file.  Social History   Tobacco Use  . Smoking status: Current Every Day Smoker    Packs/day: 0.50    Types: Cigarettes  . Smokeless tobacco: Never Used  Vaping Use  . Vaping Use: Never used  Substance Use Topics  . Alcohol use: No  . Drug use: Never    Home Medications Prior to Admission medications   Medication Sig Start Date End Date Taking? Authorizing Provider  albuterol (PROVENTIL HFA;VENTOLIN HFA) 108 (90 Base) MCG/ACT inhaler Inhale 1-2 puffs into the lungs every 6 (six) hours as needed for wheezing or shortness of breath. 06/05/17   Wynetta FinesMessick, Peter C, MD  apixaban (ELIQUIS) 5 MG TABS tablet 10 mg PO BID x 7 days then 5 mg PO BID to continue 09/19/17   Pokhrel, Rebekah ChesterfieldLaxman, MD  aspirin EC 81 MG EC tablet Take 1 tablet (81 mg total) by mouth daily. 09/20/17   Pokhrel, Rebekah ChesterfieldLaxman, MD  atropine 1 % ophthalmic solution Place 2 drops under the tongue daily. For drooling    [provider]  azaTHIOprine (IMURAN) 50 MG tablet Take 50 mg by mouth 2 (two) times daily. 08/29/17   [provider]  ciclopirox (PENLAC) 8 % solution Apply one coat  to each toenail daily. Remove with nail polish remover once every 7 days. Repeat. 07/01/19   Freddie Breech, DPM  cloZAPine (CLOZARIL) 100 MG tablet Take 300 mg by mouth 2 (two) times daily.      [provider]  cyclobenzaprine (FLEXERIL) 5 MG tablet Take 1 tablet (5 mg total) by mouth 3 (three) times daily as needed for muscle spasms. 02/14/16   Janne Napoleon, NP  diclofenac sodium (VOLTAREN) 1 % GEL Apply 2 g topically 4 (four) times daily.  02/14/16   Janne Napoleon, NP  divalproex (DEPAKOTE ER) 500 MG 24 hr tablet Take 500 mg by mouth at bedtime.     [provider]  divalproex (DEPAKOTE) 250 MG DR tablet Take 250 mg by mouth 2 (two) times daily.    [provider]  gabapentin (NEURONTIN) 100 MG capsule Take 100 mg by mouth 2 (two) times daily.    [provider]  omeprazole (PRILOSEC) 20 MG capsule Take 20 mg by mouth daily before breakfast.     [provider]  Oxymetazoline HCl (NASAL SPRAY) 0.05 % SOLN Place into the nose. 07/26/17   [provider]  predniSONE (DELTASONE) 10 MG tablet Take 20 mg by mouth daily. 08/29/17   [provider]  predniSONE (DELTASONE) 10 MG tablet Take 1 tablet (10 mg total) by mouth daily. 09/19/17   Pokhrel, Rebekah Chesterfield, MD  propranolol (INDERAL) 10 MG tablet Take 10 mg by mouth every morning.     [provider]    Allergies    Patient has no known allergies.  Review of Systems   Review of Systems  Psychiatric/Behavioral: Positive for agitation.  All other systems reviewed and are negative.   Physical Exam Updated Vital Signs BP (!) 134/113 (BP Location: Right Arm)   Pulse 92   Temp 97.6 F (36.4 C) (Axillary)   Resp (!) 24 Comment: Pt hyperventilating  SpO2 100%   Physical Exam Vitals and nursing note reviewed.  Constitutional:      Appearance: He is well-developed.  HENT:     Head: Normocephalic and atraumatic.  Eyes:     Conjunctiva/sclera: Conjunctivae normal.     Pupils: Pupils are equal, round, and reactive to light.  Cardiovascular:     Rate and Rhythm: Normal rate and regular rhythm.     Heart sounds: Normal heart sounds.  Pulmonary:     Effort: Pulmonary effort is normal.     Breath sounds: Normal breath sounds.  Abdominal:     General: Bowel sounds are normal.     Palpations: Abdomen is soft.  Musculoskeletal:        General: Normal range of motion.     Cervical back: Normal range of motion.  Skin:     General: Skin is warm and dry.  Neurological:     Mental Status: He is alert and oriented to person, place, and time.  Psychiatric:     Comments: Constantly fidgeting during exam but conversant, intermittently hyperventilating, denies SI/HI/AVH     ED Results / Procedures / Treatments   Labs (all labs ordered are listed, but only abnormal results are displayed) Labs Reviewed  COMPREHENSIVE METABOLIC PANEL - Abnormal; Notable for the following components:      Result Value   Potassium 3.3 (*)    CO2 16 (*)    Calcium 10.5 (*)    AST 43 (*)    Total Bilirubin 1.8 (*)    Anion gap 21 (*)  All other components within normal limits  SALICYLATE LEVEL - Abnormal; Notable for the following components:   Salicylate Lvl <7.0 (*)    All other components within normal limits  ACETAMINOPHEN LEVEL - Abnormal; Notable for the following components:   Acetaminophen (Tylenol), Serum <10 (*)    All other components within normal limits  CBC - Abnormal; Notable for the following components:   MCHC 29.8 (*)    All other components within normal limits  VALPROIC ACID LEVEL - Abnormal; Notable for the following components:   Valproic Acid Lvl <10 (*)    All other components within normal limits  CBG MONITORING, ED - Abnormal; Notable for the following components:   Glucose-Capillary 110 (*)    All other components within normal limits  ETHANOL  RAPID URINE DRUG SCREEN, HOSP PERFORMED    EKG None  Radiology   Procedures Procedures (including critical care time)  Medications Ordered in ED Medications  cloZAPine (CLOZARIL) tablet 300 mg (300 mg Oral Given 02/27/20 0345)  propranolol (INDERAL) tablet 10 mg (10 mg Oral Given 02/27/20 0341)  divalproex (DEPAKOTE ER) 24 hr tablet 500 mg (500 mg Oral Given 02/27/20 0355)    ED Course  I have reviewed the triage vital signs and the nursing notes.  Pertinent labs & imaging results that were available during my care of the patient were  reviewed by me and considered in my medical decision making (see chart for details).    MDM Rules/Calculators/A&P    59 year old male presenting to the ED with agitation since Monday when he ran out of his psychiatric medications.  States he was not yet able to get a new prescription.  On arrival to ED he is agitated, constantly fidgeting throughout exam, intermittently hyperventilating.  He is directable and is able to answer questions and follow commands.  He denies any suicidal homicidal ideation.  Labs were obtained from triage and are as above.  Does have elevated anion gap, however suspect this is due to low CO2 from hyperventilatory state.  We will give his home meds and reassess.  5:10 AM Patient's meds seem to have kicked in, he is now calm and resting comfortably. VSS.  Remains without SI/HI/AVH.  Will let him sleep for a bit and anticipate discharge this AM. Will refill his home psychiatric medications and have him follow-up closely with primary care doctor.  He may return here for any new or acute changes.  5:54 AM Informed by RN that patient woke up and vomited.  On reassessment, patient is lying on his right side, moaning.  He is protecting his airway and is not actively vomiting at this time.  He does not provide any verbal responses, does not follow commands.  His pupils do appear somewhat asymmetric, right 49mm, left 71mm (?chronic).  He is prescribed xarelto, cannot tell me if he has been taking as directed.  There were no falls or other head trauma while here in the ED, he has been ambulatory and conversant with staff up until this point.  He was initially hypertensive but that improved with home propranolol and BP now stable 130/72.  Will move to acute side, plan for CXR, CT Head, EKG.  Given IVF, zofran.  Will obtain CBG.  Will monitor.  6:34 AM CBG 110.  Portable CXR and CT head pending.  Care will be signed out to oncoming provider to follow-up on these studies and  reassess.  Final Clinical Impression(s) / ED Diagnoses Final diagnoses:  Medication refill  Rx / DC Orders ED Discharge Orders    None       Garlon Hatchet, PA-C 02/27/20 0528    Garlon Hatchet, PA-C 02/27/20 6160    Paula Libra, MD 02/27/20 [phone]

## 2020-02-27 NOTE — ED Provider Notes (Signed)
°  Physical Exam  BP (!) 151/84    Pulse 75    Temp (!) 97.5 F (36.4 C) (Oral)    Resp 14    SpO2 100%   Physical Exam  Gen: altered Eyes: L eye pin point, R eye 3 mm Neuro: localizes pain.   ED Course/Procedures     Procedures  MDM   Patient signed out to me by L. Allyne Gee, PA-C.  Please see previous notes for further history.  In brief, patient presenting for psychiatric evaluation.  He stopped his medications several days ago after he ran out and did not get a refill.  He presented agitated, hyperactive, and hyperventilating.  York Spaniel he has not been sleeping.  His initial labs looked okay, he was given a dose of his home medications.  Just prior to discharge, patient started vomiting.  Since then, he has been altered/'s somnolent.  Pending chest x-ray to rule out aspiration and CT head.  Patient is prescribed Xarelto, not sure if he is taking it.  Chest x-ray viewed interpreted by me, no pneumonia, torso effusion, cardiomegaly.  CT head without acute findings.  Vital signs remained stable.  On my evaluation, patient is altered and that he is not responding verbally, however is able to localize pain.  Maintaining airway.  We will continue to monitor.  UDS pending.  Presentation appears most consistent with drug and/or medication use.  1130: On reassessment, patient is more alert.  Alert to verbal stimulus.  Difficult to understand, however when asked if he took something such as drugs, he indicated yes.  I am unable to understand what he took. Will continue to monitor  1245: On reassessment, patient is more alert and oriented.  Desponded questions appropriately.  Dad is in the room, states patient is at his baseline.  Is adamant patient would not take or do any drugs.  Will take patient home, as medication refills are available at the pharmacy.  At this time, patient appears safe for discharge.  Return precautions given.  Patient and dad state they understand and agree to plan.       Alveria Apley, PA-C 02/27/20 1511    Virgina Norfolk, DO 02/27/20 1514

## 2020-02-27 NOTE — ED Notes (Signed)
Pt. Documented in error see above note in chart. 

## 2020-02-27 NOTE — ED Notes (Signed)
Gargling sound overheard at 05:55 when investigating the  Sound Mr Supinski was found to be throwing up, have a large amount of warm emesis on his face chest and bed linens. Mr Heyer was turned on his left side and mouth cleared of emesis. Lung sounds was diminished but clear and Mr Shropshire would only respond to painful stimuli. EDPA Misty Stanley A. was notified and at bedside order for CXR and CT head obtained. Pt was cleaned of emesis, 18 ga angiocath saline lock to left hand completed, portable CXR done and then pt was transported to CT dept for scan of the head. VSS pt remains obtunded at this time. Mr Heinbaugh is to go to res B report given to [employer]

## 2020-02-27 NOTE — ED Notes (Signed)
Pt wand by security prior to going in his room #30

## 2020-02-27 NOTE — Discharge Instructions (Signed)
Refills of your medications have been sent to pharmacy already.  Please pick them up and continue as directed. Follow-up with your primary care doctor. Return here for any new/acute changes.

## 2020-02-27 NOTE — ED Notes (Signed)
According to sitter, patient was attempting to get out of bed, she attempted to stop him and he sung on her. She asked for others to call security to assist, no one was called. Patient ambulated to RR, unable to collect a sample D/T aggression.

## 2020-03-12 ENCOUNTER — Encounter (HOSPITAL_COMMUNITY): Payer: Self-pay

## 2020-03-12 ENCOUNTER — Emergency Department (HOSPITAL_COMMUNITY)
Admission: EM | Admit: 2020-03-12 | Discharge: 2020-03-12 | Disposition: A | Discharge status: Home / Self Care | Payer: Medicaid Other | Attending: Emergency Medicine | Admitting: Emergency Medicine

## 2020-03-12 DIAGNOSIS — Z20822 Contact with and (suspected) exposure to covid-19: Secondary | ICD-10-CM | POA: Diagnosis not present

## 2020-03-12 DIAGNOSIS — Z046 Encounter for general psychiatric examination, requested by authority: Secondary | ICD-10-CM | POA: Diagnosis not present

## 2020-03-12 DIAGNOSIS — F17210 Nicotine dependence, cigarettes, uncomplicated: Secondary | ICD-10-CM | POA: Insufficient documentation

## 2020-03-12 DIAGNOSIS — Z7982 Long term (current) use of aspirin: Secondary | ICD-10-CM | POA: Insufficient documentation

## 2020-03-12 DIAGNOSIS — R45851 Suicidal ideations: Secondary | ICD-10-CM | POA: Diagnosis not present

## 2020-03-12 DIAGNOSIS — R451 Restlessness and agitation: Secondary | ICD-10-CM | POA: Insufficient documentation

## 2020-03-12 DIAGNOSIS — I10 Essential (primary) hypertension: Secondary | ICD-10-CM | POA: Insufficient documentation

## 2020-03-12 DIAGNOSIS — J449 Chronic obstructive pulmonary disease, unspecified: Secondary | ICD-10-CM | POA: Insufficient documentation

## 2020-03-12 DIAGNOSIS — F209 Schizophrenia, unspecified: Secondary | ICD-10-CM | POA: Diagnosis present

## 2020-03-12 LAB — CBC WITH DIFFERENTIAL/PLATELET
Abs Immature Granulocytes: 0.02 10*3/uL (ref 0.00–0.07)
Basophils Absolute: 0 10*3/uL (ref 0.0–0.1)
Basophils Relative: 0 %
Eosinophils Absolute: 0 10*3/uL (ref 0.0–0.5)
Eosinophils Relative: 0 %
HCT: 49.3 % (ref 39.0–52.0)
Hemoglobin: 15.1 g/dL (ref 13.0–17.0)
Immature Granulocytes: 0 %
Lymphocytes Relative: 28 %
Lymphs Abs: 2.1 10*3/uL (ref 0.7–4.0)
MCH: 29.2 pg (ref 26.0–34.0)
MCHC: 30.6 g/dL (ref 30.0–36.0)
MCV: 95.4 fL (ref 80.0–100.0)
Monocytes Absolute: 0.7 10*3/uL (ref 0.1–1.0)
Monocytes Relative: 9 %
Neutro Abs: 4.6 10*3/uL (ref 1.7–7.7)
Neutrophils Relative %: 63 %
Platelets: 254 10*3/uL (ref 150–400)
RBC: 5.17 MIL/uL (ref 4.22–5.81)
RDW: 15.1 % (ref 11.5–15.5)
WBC: 7.5 10*3/uL (ref 4.0–10.5)
nRBC: 0 % (ref 0.0–0.2)

## 2020-03-12 LAB — URINALYSIS, ROUTINE W REFLEX MICROSCOPIC
Bacteria, UA: NONE SEEN
Bilirubin Urine: NEGATIVE
Glucose, UA: NEGATIVE mg/dL
Hgb urine dipstick: NEGATIVE
Ketones, ur: 5 mg/dL — AB
Leukocytes,Ua: NEGATIVE
Nitrite: NEGATIVE
Protein, ur: 30 mg/dL — AB
Specific Gravity, Urine: 1.012 (ref 1.005–1.030)
pH: 6 (ref 5.0–8.0)

## 2020-03-12 LAB — ACETAMINOPHEN LEVEL: Acetaminophen (Tylenol), Serum: <10 ug/mL — ABNORMAL LOW (ref 10–30)

## 2020-03-12 LAB — BASIC METABOLIC PANEL
Anion gap: 15 (ref 5–15)
BUN: 12 mg/dL (ref 6–20)
CO2: 21 mmol/L — ABNORMAL LOW (ref 22–32)
Calcium: 10.7 mg/dL — ABNORMAL HIGH (ref 8.9–10.3)
Chloride: 101 mmol/L (ref 98–111)
Creatinine, Ser: 1.38 mg/dL — ABNORMAL HIGH (ref 0.61–1.24)
GFR, Estimated: 59 mL/min — ABNORMAL LOW (ref >60)
Glucose, Bld: 109 mg/dL — ABNORMAL HIGH (ref 70–99)
Potassium: 4 mmol/L (ref 3.5–5.1)
Sodium: 137 mmol/L (ref 135–145)

## 2020-03-12 LAB — RESPIRATORY PANEL BY RT PCR (FLU A&B, COVID)
Influenza A by PCR: NEGATIVE
Influenza B by PCR: NEGATIVE
SARS Coronavirus 2 by RT PCR: NEGATIVE

## 2020-03-12 LAB — RAPID URINE DRUG SCREEN, HOSP PERFORMED
Amphetamines: NOT DETECTED
Barbiturates: NOT DETECTED
Benzodiazepines: NOT DETECTED
Cocaine: NOT DETECTED
Opiates: NOT DETECTED
Tetrahydrocannabinol: NOT DETECTED

## 2020-03-12 LAB — SALICYLATE LEVEL: Salicylate Lvl: <7 mg/dL — ABNORMAL LOW (ref 7.0–30.0)

## 2020-03-12 LAB — ETHANOL: Alcohol, Ethyl (B): <10 mg/dL (ref <10)

## 2020-03-12 LAB — VALPROIC ACID LEVEL: Valproic Acid Lvl: 101 ug/mL — ABNORMAL HIGH (ref 50.0–100.0)

## 2020-03-12 NOTE — Discharge Instructions (Signed)
For your behavioral health needs, you are advised to continue treatment with [facility].  You have an appointment scheduled for tomorrow, Friday, March 13, 2020 at 1:30 pm:       [facility]      [address]      [place], [place] [address]      [phone]

## 2020-03-12 NOTE — ED Triage Notes (Signed)
Pt arrives voluntarily with father. Father states that pt's medication needs to be increased. Pt has been acting irrationally. Pt denies Si/HI.

## 2020-03-12 NOTE — ED Provider Notes (Signed)
Mark [name]   CSN: [id number] Arrival date & time: 03/12/20  0347     History Chief Complaint  Patient presents with  . Psychiatric Evaluation    Mark [name] is a 59 y.o. male.  59 year old male with prior medical history as detailed below presents for mental health evaluation.  Patient with increased agitation and aggressive behavior.  Patient is accompanied by his father.  Patient's father reports that the patient's medications need to be adjusted.  Patient is intermittently agitated and explosive during my evaluation.  The history is provided by a relative and the patient.  Mental Health Problem Presenting symptoms: aggressive behavior, agitation and bizarre behavior   Patient accompanied by:  Family member Degree of incapacity (severity):  Unable to specify Timing:  Unable to specify Progression:  Unable to specify Treatment compliance:  Unable to specify      Past Medical History:  Diagnosis Date  . COPD (chronic obstructive pulmonary disease) (HCC)   . Hypertension   . Schizophrenia [facility])     Patient Active Problem List   Diagnosis Date Noted  . History of adenomatous polyp of colon 11/04/2018  . Schizophrenia (HCC) 11/04/2018  . 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 DVT (deep venous thrombosis) (HCC) 09/15/2017  . COPD (chronic obstructive pulmonary disease) (HCC) 09/15/2017  . Hypertension 09/15/2017  . Leukocytosis 09/15/2017  . Bipolar disorder (HCC) 09/15/2017  . Drug therapy 06/21/2017  . 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 LOWER EXTREMITY ANGIOGRAPHY N/A 09/18/2017   Procedure: LOWER EXTREMITY ANGIOGRAPHY- Recheck lysis;  Surgeon: Maeola Harman, MD;  Location: [facility] INVASIVE CV LAB;  Service: Cardiovascular;  Laterality: N/A;  . PERIPHERAL VASCULAR INTERVENTION Left  09/18/2017   Procedure: PERIPHERAL VASCULAR INTERVENTION;  Surgeon: Maeola Harman, MD;  Location: [facility] INVASIVE CV LAB;  Service: Cardiovascular;  Laterality: Left;  . THROMBECTOMY FEMORAL ARTERY Left 09/17/2017   Procedure: POSSIBLE THROMBECTOMY;  Surgeon: Maeola Harman, MD;  Location: [facility] OR;  Service: Vascular;  Laterality: Left;  [name] Kitchen VENOGRAM Left 09/17/2017   Procedure: ULTRASOUND POPLITEAL ACCESS; CENTRAL VENOGRAM, IVVS, LYSIS CATHETER PLACEMENT;  Surgeon: Maeola Harman, MD;  Location[facility] OR;  Service: Vascular;  Laterality: Left;       History reviewed. No pertinent family history.  Social History   Tobacco Use  . Smoking status: Current Every Day Smoker    Packs/day: 0.50    Types: Cigarettes  . Smokeless tobacco: Never Used  Vaping Use  . Vaping Use: Never used  Substance Use Topics  . Alcohol use: No  . Drug use: Never    Home Medications Prior to Admission medications   Medication Sig Start Date End Date Taking? Authorizing Provider  ciclopirox (PENLAC) 8 % solution Apply one coat to each toenail daily. Remove with nail polish remover once every 7 days. Repeat. 07/01/19  Yes Freddie Breech, DPM  albuterol (PROVENTIL HFA;VENTOLIN HFA) 108 (90 Base) MCG/ACT inhaler Inhale 1-2 puffs into the lungs every 6 (six) hours as needed for wheezing or shortness of breath. 06/05/17   Wynetta Fines, MD  apixaban (ELIQUIS) 5 MG TABS tablet 10 mg PO BID x 7 days then 5 mg PO BID to continue 09/19/17   Pokhrel, Rebekah Chesterfield, MD  aspirin EC 81 MG EC tablet Take 1 tablet (81 mg total) by mouth daily. 09/20/17   Pokhrel, Rebekah Chesterfield, MD  atropine 1 % ophthalmic solution Place 2 drops under the tongue daily. For drooling    [provider]  azaTHIOprine (IMURAN) 50 MG tablet Take 50 mg by mouth 2 (two) times daily. 08/29/17   [provider]  cloZAPine (CLOZARIL) 100 MG tablet Take 1 tablet (100 mg total) by mouth daily. 02/27/20   Garlon Hatchet, PA-C    cyclobenzaprine (FLEXERIL) 5 MG tablet Take 1 tablet (5 mg total) by mouth 3 (three) times daily as needed for muscle spasms. 02/14/16   Janne Napoleon, NP  diclofenac sodium (VOLTAREN) 1 % GEL Apply 2 g topically 4 (four) times daily. 02/14/16   Janne Napoleon, NP  divalproex (DEPAKOTE ER) 500 MG 24 hr tablet Take 1 tablet (500 mg total) by mouth at bedtime. 02/27/20   Garlon Hatchet, PA-C  divalproex (DEPAKOTE) 250 MG DR tablet Take 1 tablet (250 mg total) by mouth 2 (two) times daily. 02/27/20   Garlon Hatchet, PA-C  gabapentin (NEURONTIN) 100 MG capsule Take 100 mg by mouth 2 (two) times daily.    [provider]  omeprazole (PRILOSEC) 20 MG capsule Take 20 mg by mouth daily before breakfast.     [provider]  Oxymetazoline HCl (NASAL SPRAY) 0.05 % SOLN Place into the nose. 07/26/17   [provider]  predniSONE (DELTASONE) 10 MG tablet Take 20 mg by mouth daily. 08/29/17   [provider]  predniSONE (DELTASONE) 10 MG tablet Take 1 tablet (10 mg total) by mouth daily. 09/19/17   Pokhrel, Rebekah Chesterfield, MD  propranolol (INDERAL) 10 MG tablet Take 1 tablet (10 mg total) by mouth daily. 02/27/20   Garlon Hatchet, PA-C    Allergies    Patient has no known allergies.  Review of Systems   Review of Systems  Psychiatric/Behavioral: Positive for agitation.  All other systems reviewed and are negative.   Physical Exam Updated Vital Signs BP (!) 150/100 Comment: having loud outburst wont hold still  Pulse 95   Temp 98.7 F (37.1 C) (Oral)   Resp 20   SpO2 100%   Physical Exam Vitals and nursing [name] reviewed.  Constitutional:      General: He is not in acute distress.    Appearance: He is well-developed.  HENT:     Head: Normocephalic and atraumatic.  Eyes:     Conjunctiva/sclera: Conjunctivae normal.     Pupils: Pupils are equal, round, and reactive to light.  Cardiovascular:     Rate and Rhythm: Normal rate and regular rhythm.     Heart sounds:  Normal heart sounds.  Pulmonary:     Effort: Pulmonary effort is normal. No respiratory distress.     Breath sounds: Normal breath sounds.  Abdominal:     General: There is no distension.     Palpations: Abdomen is soft.     Tenderness: There is no abdominal tenderness.  Musculoskeletal:        General: No deformity. Normal range of motion.     Cervical back: Normal range of motion and neck supple.  Skin:    General: Skin is warm and dry.  Neurological:     Mental Status: He is alert and oriented to person, place, and time.     Comments: Moderately agitated, but able to be re-directed with verbal questioning      ED Results / Procedures / Treatments   Labs (all labs ordered are listed, but only abnormal results are displayed) Labs Reviewed  URINALYSIS,  ROUTINE W REFLEX MICROSCOPIC - Abnormal; Notable for the following components:      Result Value   Color, Urine AMBER (*)    Ketones, ur 5 (*)    Protein, ur 30 (*)    All other components within normal limits  BASIC METABOLIC PANEL - Abnormal; Notable for the following components:   CO2 21 (*)    Glucose, Bld 109 (*)    Creatinine, Ser 1.38 (*)    Calcium 10.7 (*)    GFR, Estimated 59 (*)    All other components within normal limits  VALPROIC ACID LEVEL - Abnormal; Notable for the following components:   Valproic Acid Lvl 101 (*)    All other components within normal limits  RESPIRATORY PANEL BY RT PCR (FLU A&B, COVID)  CBC WITH DIFFERENTIAL/PLATELET  RAPID URINE DRUG SCREEN, HOSP PERFORMED  ETHANOL  ACETAMINOPHEN LEVEL  SALICYLATE LEVEL    EKG None  Radiology No results found.  Procedures Procedures (including critical care time)  Medications Ordered in ED Medications - No data to display  ED Course  I have reviewed the triage vital signs and the nursing notes.  Pertinent labs & imaging results that were available during my care of the patient were reviewed by me and considered in my medical decision  making (see chart for details).    MDM Rules/Calculators/A&P                          MDM  Screen complete  Mark [name] was evaluated in Emergency Department on 03/12/2020 for the symptoms described in the history of present illness. He was evaluated in the context of the global COVID-19 pandemic, which necessitated consideration that the patient might be at risk for infection with the SARS-CoV-2 virus that causes COVID-19. Institutional protocols and algorithms that pertain to the evaluation of patients at risk for COVID-19 are in a state of rapid change based on information released by regulatory bodies including the CDC and federal and state organizations. These policies and algorithms were followed during the patient's care in the ED.  Patient is presenting from requested mental health evaluation.  Screening labs obtained in ED are without evidence of significant acute medical pathology that would interfere with psychiatric assessment.  TTS has completed their evaluation.  Patient is appropriate for discharge.  Patient does have already established outpatient follow-up visit for tomorrow afternoon at [facility] at 1:30.   Final Clinical Impression(s) / ED Diagnoses Final diagnoses:  Agitation    Rx / DC Orders ED Discharge Orders    None       Wynetta Fines, MD 03/12/20 [phone]

## 2020-03-12 NOTE — BH Assessment (Signed)
Tele Assessment Note   Patient Name: Mark [name] MRN: [id number] Referring Physician: Wynetta Fines, MD Location of Patient:  [facility] Location of Provider: Behavioral Health TTS Department  Mark [name] is an 59 y.o. male present to [facility] accompanied by his father for verbally aggressive behavior, auditory/visual hallucinations and requesting increase in medication. Patient verbally agitated and aggressive. TTS assessor had to repeatedly redirect patient to calm down. Patient's dad present during the assessment. Due to patient verbal aggression and interruptions when TTS assessor attempted to speak with patient's father, he had to be moved to a different location so collateral information could be obtained. Dad provided information for the assessment due to TTS assessor inability to understand patient speech and patient's verbal aggressive outburst. Dad reported patient's behavior has progressively gotten worse over the past few months when [facility] psychiatrist decreased his medication dosage. Report patient has progressively became verbally aggressive and threatening others. Dad stated he has never seen patient display this type of behavior before. Denied patient is suicidal/homicidal, stating patient is experiencing auditory/visual hallucinations. Report he feels patient will be a danger to self or others as his behaviors continues to regress. Patient is seen at [facility] for medication. Denied substance use. Denied past suicidal ideations/attempts. Dad report he has reported behavioral changes to [facility], however, nothing has been done related to changing his son's medication.   Disposition: Berneice Heinrich, NP, patient does not meet inpatient criteria at this time   Diagnosis: Schizophrenia   Past Medical History:  Past Medical History:  Diagnosis Date   COPD (chronic obstructive pulmonary disease) (HCC)    Hypertension    Schizophrenia (HCC)     Past Surgical History:  Procedure  Laterality Date   LOWER EXTREMITY ANGIOGRAPHY N/A 09/18/2017   Procedure: LOWER EXTREMITY ANGIOGRAPHY- Recheck lysis;  Surgeon: Mark Harman, MD;  Location: [facility] INVASIVE CV LAB;  Service: Cardiovascular;  Laterality: N/A;   PERIPHERAL VASCULAR INTERVENTION Left 09/18/2017   Procedure: PERIPHERAL VASCULAR INTERVENTION;  Surgeon: Mark Harman, MD;  Location: [facility] INVASIVE CV LAB;  Service: Cardiovascular;  Laterality: Left;   THROMBECTOMY FEMORAL ARTERY Left 09/17/2017   Procedure: POSSIBLE THROMBECTOMY;  Surgeon: Mark Harman, MD;  Location: [facility] OR;  Service: Vascular;  Laterality: Left;   VENOGRAM Left 09/17/2017   Procedure: ULTRASOUND POPLITEAL ACCESS; CENTRAL VENOGRAM, IVVS, LYSIS CATHETER PLACEMENT;  Surgeon: Mark Harman, MD;  Location: [facility] OR;  Service: Vascular;  Laterality: Left;    Family History: History reviewed. No pertinent family history.  Social History:  reports that he has been smoking cigarettes. He has been smoking about 0.50 packs per day. He has never used smokeless tobacco. He reports that he does not drink alcohol and does not use drugs.  Additional Social History:  Alcohol / Drug Use Pain Medications: see MAR Prescriptions: see MAR Over the Counter: see MAR History of alcohol / drug use?: No history of alcohol / drug abuse Longest period of sobriety (when/how long): n/a  CIWA: CIWA-Ar BP: (!) 145/97 Pulse Rate: 92 COWS:    Allergies: No Known Allergies  Home Medications: (Not in a hospital admission)   OB/GYN Status:  No LMP for male patient.  General Assessment Data Location of Assessment: [facility] TTS Assessment: In system Is this a Tele or Face-to-Face Assessment?: Tele Assessment Is this an Initial Assessment or a Re-assessment for this encounter?: Initial Assessment Patient Accompanied by:: Parent (dad-Mark [name] ) Language Other than English: No Living Arrangements: Other (Comment) (live  with  dad and brother ) What gender do you identify as?: Male Date Telepsych consult ordered in [facility]: 03/12/20 Time Telepsych consult ordered in [facility]: 1132 Marital status: Single Living Arrangements: Parent Can pt return to current living arrangement?: Yes Admission Status: Voluntary Is patient capable of signing voluntary admission?: Yes Referral Source: Self/Family/Friend Insurance type: Medicaid      Crisis Care Plan Living Arrangements: Parent Legal Guardian: Father (Dad - Mark [name] [phone]) Name of Psychiatrist: Vesta [name]  Name of Therapist: denied   Education Status Is patient currently in school?: No Is the patient employed, unemployed or receiving disability?: Receiving disability income  Risk to self with the past 6 months Suicidal Ideation: No Has patient been a risk to self within the past 6 months prior to admission? : No Suicidal Intent: No Has patient had any suicidal intent within the past 6 months prior to admission? : No Is patient at risk for suicide?: No Suicidal Plan?: No Has patient had any suicidal plan within the past 6 months prior to admission? : No Access to Means: No What has been your use of drugs/alcohol within the last 12 months?: none reported  Previous Attempts/Gestures: No How many times?: 0 Other Self Harm Risks: none reported  Triggers for Past Attempts: None known Intentional Self Injurious Behavior: None Family Suicide History: No Recent stressful life event(s): Other (Comment) (change in medication ) Persecutory voices/beliefs?: No Depression:  (none report ) Depression Symptoms:  (not reported ) Substance abuse history and/or treatment for substance abuse?: No Suicide prevention information given to non-admitted patients: Not applicable  Risk to Others within the past 6 months Homicidal Ideation: No Does patient have any lifetime risk of violence toward others beyond the six months prior to admission? : No Thoughts  of Harm to Others: No Current Homicidal Intent: No Current Homicidal Plan: No Access to Homicidal Means: No Identified Victim: none report History of harm to others?: No Assessment of Violence: On admission (verbal aggression ) Violent Behavior Description: verbal aggression  Does patient have access to weapons?: No Criminal Charges Pending?: No Does patient have a court date: No Is patient on probation?: No  Psychosis Hallucinations: Auditory, Visual Delusions: None noted  Mental Status Report Appearance/Hygiene: Other (Comment) (dressed in shorts, tee shirt and jacket ) Eye Contact: Fair Motor Activity: Freedom of movement Speech: Aggressive, Other (Comment) (mumble ) Level of Consciousness: Alert, Other (Comment) (irritable ) Mood: Other (Comment) (verbally aggressive, irritable ) Affect: Irritable Anxiety Level: None Thought Processes: Unable to Assess Judgement: Unimpaired Orientation: Unable to assess Obsessive Compulsive Thoughts/Behaviors: None  Cognitive Functioning Concentration: Decreased Memory: Recent Intact, Remote Intact Is patient IDD:  (per dad pt not IDD, pt displays cognitive impairment ) Insight: Poor Impulse Control: Poor Appetite: Fair Have you had any weight changes? : No Change Sleep: No Change Total Hours of Sleep: 7 Vegetative Symptoms: None  ADLScreening [facility] Assessment Services) Patient's cognitive ability adequate to safely complete daily activities?: Yes Patient able to express need for assistance with ADLs?: Yes Independently performs ADLs?: Yes (appropriate for developmental age)  Prior Inpatient Therapy Prior Inpatient Therapy: No  Prior Outpatient Therapy Prior Outpatient Therapy: Yes Prior Therapy Facilty/Provider(s): [facility]  Reason for Treatment: mental health  Does patient have an ACCT team?: No Does patient have Intensive In-House Services?  : No Does patient have [facility] services? : Yes Does patient have P4CC services?:  No  ADL Screening (condition at time of admission) Patient's cognitive ability adequate to safely complete daily activities?: Yes Is  the patient deaf or have difficulty hearing?: No Does the patient have difficulty seeing, even when wearing glasses/contacts?: Yes Does the patient have difficulty concentrating, remembering, or making decisions?: Yes Patient able to express need for assistance with ADLs?: Yes Does the patient have difficulty dressing or bathing?: No Independently performs ADLs?: Yes (appropriate for developmental age) Does the patient have difficulty walking or climbing stairs?: No       Abuse/Neglect Assessment (Assessment to be complete while patient is alone) Abuse/Neglect Assessment Can Be Completed: Yes Physical Abuse: Denies Verbal Abuse: Denies Sexual Abuse: Denies Exploitation of patient/patient's resources: Denies Self-Neglect: Denies     Merchant navy officer (For Healthcare) Does Patient Have a Medical Advance Directive?: No Would patient like information on creating a medical advance directive?: No - Patient declined          Disposition:  Disposition Initial Assessment Completed for this Encounter: Yes Berneice Heinrich, NP)  This service was provided via telemedicine using a 2-way, interactive audio and video technology.  Names of all persons participating in this telemedicine service and their role in this encounter. Name: Declyn Delsol Role: father [phone])  Name:  Briya Lookabaugh - TTS  Role: TTS assessor   Name:  Role:   Name:  Role:     Dian Situ 03/12/2020 1:12 PM

## 2020-03-12 NOTE — BH Assessment (Signed)
[facility] Assessment Progress Note  Per Berneice Heinrich, NP, this voluntary pt does not require psychiatric hospitalization at this time.  Pt is psychiatrically cleared.  Pt receives outpatient treatment through [facility].  At the request of Carvel Getting, TS, this writer spoke to pt's father, Khoury Siemon, who accompanied pt to [facility].  He reports having reached out to [facility] and was awaiting a return call to arrange for pt to be seen today, but never heard back from them.  At his request I called [facility] to schedule an appointment.  At 13:58 I called and spoke to [facility].  She has scheduled pt for tomorrow, Friday, 03/13/2020 at 13:30.  This has been included in pt's discharge instructions.  Annabelle Harman asks that these be faxed to them at [phone], to which I have agreed.  EDP Kristine Royal, MD and pt's nurse, Judeth Cornfield, have been notified.  Doylene Canning, MA Triage Specialist [phone]

## 2021-02-11 ENCOUNTER — Other Ambulatory Visit: Payer: Self-pay | Admitting: Internal Medicine

## 2021-02-12 LAB — LIPID PANEL
Cholesterol: 171 mg/dL (ref <200)
HDL: 43 mg/dL (ref >=40)
LDL Cholesterol (Calc): 102 mg/dL (calc) — ABNORMAL HIGH
Non-HDL Cholesterol (Calc): 128 mg/dL (calc) (ref <130)
Total CHOL/HDL Ratio: 4 (calc) (ref <5.0)
Triglycerides: 154 mg/dL — ABNORMAL HIGH (ref <150)

## 2021-02-12 LAB — COMPLETE METABOLIC PANEL WITH GFR
AG Ratio: 1.3 (calc) (ref 1.0–2.5)
ALT: 10 U/L (ref 9–46)
AST: 13 U/L (ref 10–35)
Albumin: 4.2 g/dL (ref 3.6–5.1)
Alkaline phosphatase (APISO): 69 U/L (ref 35–144)
BUN: 10 mg/dL (ref 7–25)
CO2: 22 mmol/L (ref 20–32)
Calcium: 10.5 mg/dL — ABNORMAL HIGH (ref 8.6–10.3)
Chloride: 109 mmol/L (ref 98–110)
Creat: 1.06 mg/dL (ref 0.70–1.30)
Globulin: 3.2 g/dL (calc) (ref 1.9–3.7)
Glucose, Bld: 87 mg/dL (ref 65–99)
Potassium: 4.7 mmol/L (ref 3.5–5.3)
Sodium: 143 mmol/L (ref 135–146)
Total Bilirubin: 0.3 mg/dL (ref 0.2–1.2)
Total Protein: 7.4 g/dL (ref 6.1–8.1)
eGFR: 81 mL/min/{1.73_m2} (ref >=60)

## 2021-02-12 LAB — VITAMIN D 25 HYDROXY (VIT D DEFICIENCY, FRACTURES): Vit D, 25-Hydroxy: 99 ng/mL (ref 30–100)

## 2021-02-12 LAB — CBC
HCT: 47 % (ref 38.5–50.0)
Hemoglobin: 15.1 g/dL (ref 13.2–17.1)
MCH: 28.3 pg (ref 27.0–33.0)
MCHC: 32.1 g/dL (ref 32.0–36.0)
MCV: 88.2 fL (ref 80.0–100.0)
MPV: 11.7 fL (ref 7.5–12.5)
Platelets: 266 10*3/uL (ref 140–400)
RBC: 5.33 10*6/uL (ref 4.20–5.80)
RDW: 14.4 % (ref 11.0–15.0)
WBC: 9.1 10*3/uL (ref 3.8–10.8)

## 2021-02-12 LAB — PSA: PSA: 0.47 ng/mL (ref <=4.00)

## 2021-02-12 LAB — TSH: TSH: 2.16 mIU/L (ref 0.40–4.50)

## 2021-06-28 ENCOUNTER — Emergency Department (HOSPITAL_COMMUNITY): Payer: Medicaid Other

## 2021-06-28 ENCOUNTER — Observation Stay (HOSPITAL_COMMUNITY)
Admission: EM | Admit: 2021-06-28 | Discharge: 2021-06-29 | Disposition: A | Discharge status: Other Acute Inpatient Hospital | Payer: Medicaid Other | Attending: Family Medicine | Admitting: Family Medicine

## 2021-06-28 ENCOUNTER — Other Ambulatory Visit: Payer: Self-pay

## 2021-06-28 ENCOUNTER — Encounter (HOSPITAL_COMMUNITY): Payer: Self-pay

## 2021-06-28 DIAGNOSIS — Z79899 Other long term (current) drug therapy: Secondary | ICD-10-CM | POA: Insufficient documentation

## 2021-06-28 DIAGNOSIS — R739 Hyperglycemia, unspecified: Secondary | ICD-10-CM | POA: Diagnosis not present

## 2021-06-28 DIAGNOSIS — Z20822 Contact with and (suspected) exposure to covid-19: Secondary | ICD-10-CM | POA: Insufficient documentation

## 2021-06-28 DIAGNOSIS — J449 Chronic obstructive pulmonary disease, unspecified: Secondary | ICD-10-CM | POA: Diagnosis not present

## 2021-06-28 DIAGNOSIS — R4182 Altered mental status, unspecified: Secondary | ICD-10-CM

## 2021-06-28 DIAGNOSIS — T43591A Poisoning by other antipsychotics and neuroleptics, accidental (unintentional), initial encounter: Principal | ICD-10-CM | POA: Insufficient documentation

## 2021-06-28 DIAGNOSIS — Z7901 Long term (current) use of anticoagulants: Secondary | ICD-10-CM | POA: Insufficient documentation

## 2021-06-28 DIAGNOSIS — N179 Acute kidney failure, unspecified: Secondary | ICD-10-CM | POA: Diagnosis not present

## 2021-06-28 DIAGNOSIS — Z7982 Long term (current) use of aspirin: Secondary | ICD-10-CM | POA: Insufficient documentation

## 2021-06-28 DIAGNOSIS — I10 Essential (primary) hypertension: Secondary | ICD-10-CM | POA: Insufficient documentation

## 2021-06-28 DIAGNOSIS — T50901A Poisoning by unspecified drugs, medicaments and biological substances, accidental (unintentional), initial encounter: Secondary | ICD-10-CM | POA: Diagnosis present

## 2021-06-28 DIAGNOSIS — F17210 Nicotine dependence, cigarettes, uncomplicated: Secondary | ICD-10-CM | POA: Diagnosis not present

## 2021-06-28 DIAGNOSIS — Z86718 Personal history of other venous thrombosis and embolism: Secondary | ICD-10-CM | POA: Diagnosis not present

## 2021-06-28 DIAGNOSIS — R4689 Other symptoms and signs involving appearance and behavior: Secondary | ICD-10-CM

## 2021-06-28 LAB — CBC
HCT: 47.3 % (ref 39.0–52.0)
Hemoglobin: 14.7 g/dL (ref 13.0–17.0)
MCH: 28.6 pg (ref 26.0–34.0)
MCHC: 31.1 g/dL (ref 30.0–36.0)
MCV: 92 fL (ref 80.0–100.0)
Platelets: 267 10*3/uL (ref 150–400)
RBC: 5.14 MIL/uL (ref 4.22–5.81)
RDW: 14 % (ref 11.5–15.5)
WBC: 10.5 10*3/uL (ref 4.0–10.5)
nRBC: 0 % (ref 0.0–0.2)

## 2021-06-28 LAB — LACTIC ACID, PLASMA
Lactic Acid, Venous: 1.7 mmol/L (ref 0.5–1.9)
Lactic Acid, Venous: 1.6 mmol/L (ref 0.5–1.9)

## 2021-06-28 LAB — COMPREHENSIVE METABOLIC PANEL
ALT: 15 U/L (ref 0–44)
AST: 26 U/L (ref 15–41)
Albumin: 4 g/dL (ref 3.5–5.0)
Alkaline Phosphatase: 61 U/L (ref 38–126)
Anion gap: 12 (ref 5–15)
BUN: 15 mg/dL (ref 6–20)
CO2: 21 mmol/L — ABNORMAL LOW (ref 22–32)
Calcium: 10.2 mg/dL (ref 8.9–10.3)
Chloride: 109 mmol/L (ref 98–111)
Creatinine, Ser: 1.37 mg/dL — ABNORMAL HIGH (ref 0.61–1.24)
GFR, Estimated: 59 mL/min — ABNORMAL LOW (ref >60)
Glucose, Bld: 106 mg/dL — ABNORMAL HIGH (ref 70–99)
Potassium: 4.1 mmol/L (ref 3.5–5.1)
Sodium: 142 mmol/L (ref 135–145)
Total Bilirubin: 0.5 mg/dL (ref 0.3–1.2)
Total Protein: 7.7 g/dL (ref 6.5–8.1)

## 2021-06-28 LAB — CBG MONITORING, ED: Glucose-Capillary: 142 mg/dL — ABNORMAL HIGH (ref 70–99)

## 2021-06-28 LAB — BLOOD GAS, ARTERIAL
Acid-Base Excess: 2.8 mmol/L — ABNORMAL HIGH (ref 0.0–2.0)
Bicarbonate: 26.1 mmol/L (ref 20.0–28.0)
O2 Saturation: 95 %
Patient temperature: 37
pCO2 arterial: 35 mmHg (ref 32–48)
pH, Arterial: 7.48 — ABNORMAL HIGH (ref 7.35–7.45)
pO2, Arterial: 67 mmHg — ABNORMAL LOW (ref 83–108)

## 2021-06-28 LAB — CBC WITH DIFFERENTIAL/PLATELET
Abs Immature Granulocytes: 0.03 10*3/uL (ref 0.00–0.07)
Abs Immature Granulocytes: 0.02 10*3/uL (ref 0.00–0.07)
Basophils Absolute: 0 10*3/uL (ref 0.0–0.1)
Basophils Absolute: 0 10*3/uL (ref 0.0–0.1)
Basophils Relative: 0 %
Basophils Relative: 1 %
Eosinophils Absolute: 0 10*3/uL (ref 0.0–0.5)
Eosinophils Absolute: 0 10*3/uL (ref 0.0–0.5)
Eosinophils Relative: 0 %
Eosinophils Relative: 1 %
HCT: 48.3 % (ref 39.0–52.0)
HCT: 45.4 % (ref 39.0–52.0)
Hemoglobin: 15.2 g/dL (ref 13.0–17.0)
Hemoglobin: 14.4 g/dL (ref 13.0–17.0)
Immature Granulocytes: 0 %
Immature Granulocytes: 0 %
Lymphocytes Relative: 25 %
Lymphocytes Relative: 36 %
Lymphs Abs: 2.4 10*3/uL (ref 0.7–4.0)
Lymphs Abs: 3 10*3/uL (ref 0.7–4.0)
MCH: 28.2 pg (ref 26.0–34.0)
MCH: 29 pg (ref 26.0–34.0)
MCHC: 31.5 g/dL (ref 30.0–36.0)
MCHC: 31.7 g/dL (ref 30.0–36.0)
MCV: 89.6 fL (ref 80.0–100.0)
MCV: 91.3 fL (ref 80.0–100.0)
Monocytes Absolute: 0.9 10*3/uL (ref 0.1–1.0)
Monocytes Absolute: 0.9 10*3/uL (ref 0.1–1.0)
Monocytes Relative: 10 %
Monocytes Relative: 10 %
Neutro Abs: 6.3 10*3/uL (ref 1.7–7.7)
Neutro Abs: 4.5 10*3/uL (ref 1.7–7.7)
Neutrophils Relative %: 65 %
Neutrophils Relative %: 52 %
Platelets: 305 10*3/uL (ref 150–400)
Platelets: 276 10*3/uL (ref 150–400)
RBC: 5.39 MIL/uL (ref 4.22–5.81)
RBC: 4.97 MIL/uL (ref 4.22–5.81)
RDW: 13.8 % (ref 11.5–15.5)
RDW: 14 % (ref 11.5–15.5)
WBC: 9.7 10*3/uL (ref 4.0–10.5)
WBC: 8.5 10*3/uL (ref 4.0–10.5)
nRBC: 0 % (ref 0.0–0.2)
nRBC: 0 % (ref 0.0–0.2)

## 2021-06-28 LAB — BASIC METABOLIC PANEL
Anion gap: 7 (ref 5–15)
BUN: 16 mg/dL (ref 6–20)
CO2: 24 mmol/L (ref 22–32)
Calcium: 9.9 mg/dL (ref 8.9–10.3)
Chloride: 110 mmol/L (ref 98–111)
Creatinine, Ser: 1.4 mg/dL — ABNORMAL HIGH (ref 0.61–1.24)
GFR, Estimated: 58 mL/min — ABNORMAL LOW (ref >60)
Glucose, Bld: 190 mg/dL — ABNORMAL HIGH (ref 70–99)
Potassium: 4 mmol/L (ref 3.5–5.1)
Sodium: 141 mmol/L (ref 135–145)

## 2021-06-28 LAB — RESP PANEL BY RT-PCR (FLU A&B, COVID) ARPGX2
Influenza A by PCR: NEGATIVE
Influenza B by PCR: NEGATIVE
SARS Coronavirus 2 by RT PCR: NEGATIVE

## 2021-06-28 LAB — CREATININE, SERUM
Creatinine, Ser: 1.17 mg/dL (ref 0.61–1.24)
GFR, Estimated: >60 mL/min (ref >60)

## 2021-06-28 LAB — ETHANOL: Alcohol, Ethyl (B): <10 mg/dL (ref <10)

## 2021-06-28 LAB — VALPROIC ACID LEVEL: Valproic Acid Lvl: 68 ug/mL (ref 50.0–100.0)

## 2021-06-28 MED ORDER — CLOZAPINE 100 MG PO TABS
400.0000 mg | ORAL_TABLET | Freq: Every day | ORAL | Status: DC
  Administered 2021-06-28: 400 mg via ORAL
  Filled 2021-06-28 (×2): qty 4

## 2021-06-28 MED ORDER — GABAPENTIN 400 MG PO CAPS
400.0000 mg | ORAL_CAPSULE | Freq: Three times a day (TID) | ORAL | Status: DC
  Administered 2021-06-28: 400 mg via ORAL
  Filled 2021-06-28: qty 1

## 2021-06-28 MED ORDER — ENOXAPARIN SODIUM 40 MG/0.4ML IJ SOSY
40.0000 mg | PREFILLED_SYRINGE | INTRAMUSCULAR | Status: DC
  Administered 2021-06-29: 40 mg via SUBCUTANEOUS
  Filled 2021-06-28: qty 0.4

## 2021-06-28 MED ORDER — PROPRANOLOL HCL 10 MG PO TABS
10.0000 mg | ORAL_TABLET | Freq: Every day | ORAL | Status: DC
  Administered 2021-06-28: 10 mg via ORAL
  Filled 2021-06-28: qty 1

## 2021-06-28 MED ORDER — SODIUM CHLORIDE 0.9 % IV SOLN: INTRAVENOUS | Status: DC

## 2021-06-28 MED ORDER — DIPHENHYDRAMINE HCL 50 MG/ML IJ SOLN
50.0000 mg | Freq: Once | INTRAMUSCULAR | Status: AC
  Administered 2021-06-28: 50 mg via INTRAMUSCULAR
  Filled 2021-06-28: qty 1

## 2021-06-28 MED ORDER — GABAPENTIN 100 MG PO CAPS
100.0000 mg | ORAL_CAPSULE | Freq: Two times a day (BID) | ORAL | Status: DC
  Administered 2021-06-28: 100 mg via ORAL
  Filled 2021-06-28: qty 1

## 2021-06-28 MED ORDER — HALOPERIDOL LACTATE 5 MG/ML IJ SOLN
2.5000 mg | Freq: Once | INTRAMUSCULAR | Status: AC | PRN
  Administered 2021-06-29: 2.5 mg via INTRAMUSCULAR
  Filled 2021-06-28: qty 1

## 2021-06-28 MED ORDER — HALOPERIDOL LACTATE 5 MG/ML IJ SOLN
5.0000 mg | Freq: Once | INTRAMUSCULAR | Status: AC
  Administered 2021-06-28: 5 mg via INTRAMUSCULAR
  Filled 2021-06-28: qty 1

## 2021-06-28 MED ORDER — DIVALPROEX SODIUM ER 500 MG PO TB24
500.0000 mg | ORAL_TABLET | Freq: Two times a day (BID) | ORAL | Status: DC
  Administered 2021-06-28: 500 mg via ORAL
  Filled 2021-06-28: qty 1

## 2021-06-28 NOTE — ED Notes (Signed)
Pt resting at this point, no complaints

## 2021-06-28 NOTE — Progress Notes (Signed)
° ° °  OVERNIGHT PROGRESS REPORT  Notified by RN for call from poison control in reference to recommended lab tests. Poison control recommends Depakote level, EKG, ammonia. Orders entered     Chinita Greenland MSNA MSN ACNPC-AG Acute Care Nurse Practitioner [facility]

## 2021-06-28 NOTE — ED Notes (Signed)
Pt has been changed out into burgundy scrubs pt belonging bag is in the cabinet 1-4

## 2021-06-28 NOTE — Progress Notes (Signed)
Call received from [facility] - poison control. Poison control rep. Recommended Depakote and ammonia level and 12 lead EKG to follow-up with the care intervention for the patient. Made oncall J. Garner Nash APP aware of recommendations. Will continue to monitor.

## 2021-06-28 NOTE — Progress Notes (Signed)
Upon initial admission onto 4th floor, observed that left pupil is 1 mm and nonreactive, and right pupil is 3 mm, and sluggish in movement. Observed patient as unresponsive. Sternal rub performed and patient responsive to pain. On-call practitioner notified and NP came to bedside. According to ED MD notes, this is not a change from previous assessment. No new orders at this time. Will continue to monitor. Safety sitter at bedside.  Sinclair Ship, RN

## 2021-06-28 NOTE — H&P (Signed)
History and Physical    Patient: Mark [name] Mark [name] DOB: 1960-09-18 DOA: 06/28/2021 DOS: the patient was seen and examined on 06/28/2021 PCP: Fleet Contras, MD  Patient coming from: Home  Chief Complaint:  Chief Complaint  Patient presents with   Aggressive Behavior    HPI: Mark [name] is a 61 y.o. male with medical history significant of schizophrenia, DVT. Presenting with aggressive behavior. History from chart review as patient is somnolent/altered at this time. Apparently law enforcement was called to the patient's home early this morning due to aggressive behavior towards family. He was transported to the ED for evaluation. Psyc was consulted. While awaiting a [facility] bed, his home regimen was resumed in the ED. About 40 minutes after receiving his clozaril, the patient became somnolent and difficult to arouse. His BP droppped. He vomited. He was given fluids and the vomitus was suctioned out of his mouth. He did not have any desaturations. CTH and CXR were negative. Poison control was called. TRH was called for admission.   Review of Systems: As mentioned in the history of present illness. All other systems reviewed and are negative. Past Medical History:  Diagnosis Date   COPD (chronic obstructive pulmonary disease) (HCC)    Hypertension    Schizophrenia (HCC)    Past Surgical History:  Procedure Laterality Date   LOWER EXTREMITY ANGIOGRAPHY N/A 09/18/2017   Procedure: LOWER EXTREMITY ANGIOGRAPHY- Recheck lysis;  Surgeon: Maeola Harman, MD;  Location: [facility] INVASIVE CV LAB;  Service: Cardiovascular;  Laterality: N/A;   PERIPHERAL VASCULAR INTERVENTION Left 09/18/2017   Procedure: PERIPHERAL VASCULAR INTERVENTION;  Surgeon: Maeola Harman, MD;  Location: [facility] INVASIVE CV LAB;  Service: Cardiovascular;  Laterality: Left;   THROMBECTOMY FEMORAL ARTERY Left 09/17/2017   Procedure: POSSIBLE THROMBECTOMY;  Surgeon: Maeola Harman, MD;  Location:  [facility] OR;  Service: Vascular;  Laterality: Left;   VENOGRAM Left 09/17/2017   Procedure: ULTRASOUND POPLITEAL ACCESS; CENTRAL VENOGRAM, IVVS, LYSIS CATHETER PLACEMENT;  Surgeon: Maeola Harman, MD;  Location: [facility] OR;  Service: Vascular;  Laterality: Left;   Social History:  reports that he has been smoking cigarettes. He has been smoking an average of .5 packs per day. He has never used smokeless tobacco. He reports that he does not drink alcohol and does not use drugs.  No Known Allergies  History reviewed. No pertinent family history.  Prior to Admission medications   Medication Sig Start Date End Date Taking? Authorizing Provider  cloZAPine (CLOZARIL) 100 MG tablet Take 1 tablet (100 mg total) by mouth daily. Patient taking differently: Take 400 mg by mouth daily. 02/27/20  Yes Garlon Hatchet, PA-C  divalproex (DEPAKOTE ER) 500 MG 24 hr tablet Take 1 tablet (500 mg total) by mouth at bedtime. Patient taking differently: Take 500 mg by mouth 2 (two) times daily. 02/27/20  Yes Garlon Hatchet, PA-C  gabapentin (NEURONTIN) 100 MG capsule Take 100 mg by mouth 2 (two) times daily.   Yes [provider]  gabapentin (NEURONTIN) 400 MG capsule Take 400 mg by mouth 3 (three) times daily. 06/17/21  Yes [provider]  propranolol (INDERAL) 10 MG tablet Take 1 tablet (10 mg total) by mouth daily. 02/27/20  Yes Garlon Hatchet, PA-C  apixaban (ELIQUIS) 5 MG TABS tablet 10 mg PO BID x 7 days then 5 mg PO BID to continue Patient not taking: Reported on 06/28/2021 09/19/17   Joycelyn Das, MD  aspirin EC 81 MG EC tablet Take 1 tablet (  81 mg total) by mouth daily. Patient not taking: Reported on 06/28/2021 09/20/17   Joycelyn Das, MD  azaTHIOprine (IMURAN) 50 MG tablet Take 50 mg by mouth 2 (two) times daily. Patient not taking: Reported on 06/28/2021 08/29/17   [provider]  ciclopirox (PENLAC) 8 % solution Apply one coat to each toenail daily. Remove with nail polish  remover once every 7 days. Repeat. Patient not taking: Reported on 06/28/2021 07/01/19   Freddie Breech, DPM  omeprazole (PRILOSEC) 20 MG capsule Take 20 mg by mouth daily before breakfast.  Patient not taking: Reported on 06/28/2021    [provider]  Vitamin D, Ergocalciferol, (DRISDOL) 1.25 MG (50000 UNIT) CAPS capsule Take 50,000 Units by mouth once a week. Patient not taking: Reported on 06/28/2021 06/17/21   [provider]    Physical Exam: Vitals:   06/28/21 1430 06/28/21 1450 06/28/21 1500 06/28/21 1600  BP: 108/62 117/61 (!) 110/58 119/62  Pulse: 68 67 65 62  Resp: 16 18 18 18   Temp:      TempSrc:      SpO2: 100% 100% 100% 100%  Weight:      Height:       General: 61 y.o. male resting in bed in NAD Eyes: normal sclera ENMT: Nares patent w/o discharge, orophaynx clear, dentition poor, ears w/o discharge/lesions/ulcers Neck: Supple, trachea midline Cardiovascular: RRR, +S1, S2, no m/g/r, equal pulses throughout Respiratory: CTABL, no w/r/r, normal WOB on RA GI: BS+, NDNT, no masses noted, no organomegaly noted MSK: No e/c/c Neuro: somnolent, responsive to noxious stimuli   Data Reviewed:  Glucose  190 Scr 1.40 WBC 8.5  CXR: No evidence of acute cardiopulmonary disease. CTH: No acute intracranial abnormality. EKG: sinus, no st elevation   Assessment and Plan: No notes have been filed under this hospital service. Service: Hospitalist Drug overdose     - place in obs, progressive     - most likely that patient hasn't been compliant on his medications, so when he actually received his "regular" dose, it was too much for now     - hold clozaril for now     - psyc reconsulted     - per poison control, supportive care     - EKG was ok     - spoke with PCCM, good chest rise right now and maintaining sats; keep head of bed elevated and monitor  AKI     - fluids     - watch nephrotoxins  Schizophrenia     - holding clozaril; continue other  regimen as he wakes  Hyperglycemia     - no history of DM; check A1c  Advance Care Planning:   Code Status: Full Code  Consults: sidebarred PCCM  Family Communication: None at bedside  Severity of Illness: The appropriate patient status for this patient is OBSERVATION. Observation status is judged to be reasonable and necessary in order to provide the required intensity of service to ensure the patient's safety. The patient's presenting symptoms, physical exam findings, and initial radiographic and laboratory data in the context of their medical condition is felt to place them at decreased risk for further clinical deterioration. Furthermore, it is anticipated that the patient will be medically stable for discharge from the hospital within 2 midnights of admission.   [name]: 47, DO 06/28/2021 4:26 PM  For on call review [email or web address].

## 2021-06-28 NOTE — ED Notes (Signed)
Rns notified that pt's breathing seemed off. Pt had been noted to be snoring. Airway patent and breathing normal, pulse palpable, but pt unable to be roused. EDP Hong at bedside. CBG obtained, head CT and ABG ordered. Respiratory called for ABG. Pt placed on monitor.

## 2021-06-28 NOTE — ED Provider Notes (Signed)
I was called over to this patient's bedside as he was difficult to awaken per nursing staff.  His blood pressure dropped into the 80s.  Upon my arrival patient had minimal reaction to sternal rub.  He appeared very somnolent.  With brisk sternal rub but he would flail his arms and try to push me away but remained nonverbal and then would fall right back to sleep.  Per nursing they had just given 400 mg of clozapine, which was reportedly his regular dose.  This was given about 40 minutes prior to patient's symptoms being recognized.  I decided initially just to watch the patient to see if he would metabolize the medication.  However I was called back to his bedside later on stating that he had vomited and there was some concern that he may have aspirated some of the vomitus.  He had been given a liter bolus of fluids improvement of blood pressure, vomit was suctioned out of his mouth, no desaturations noted O2 saturation about 93 to 95% on room air which is appropriate.  Chest x-ray was ordered and pending.  Poison control was consulted, recommend symptom management and continued observation until return to baseline mental status.  Per poison control it would be unusual for this medication at this dosage to cause this kind of sedation.  Psychiatry team reconsulted for evaluation again given change in symptoms.    Luna Fuse, MD 06/28/21 [phone]

## 2021-06-28 NOTE — ED Provider Notes (Signed)
[facility]-EMERGENCY DEPT Provider Note   CSN: [id number] Arrival date & time: 06/28/21  0049     History  Chief Complaint  Patient presents with   Aggressive Behavior    Mark [name] is a 61 y.o. male.  61 year old male with a history of schizophrenia, hypertension, COPD presents to the emergency department for aggression.  Police were called to the patient's home due to patient being aggressive towards family.  Patient states that people were trying to hold him down.  He does not feel that he is on the correct medication, but denies any AVH at this time.  No suicidal ideations.  The history is provided by the patient and the EMS personnel.      Home Medications Prior to Admission medications   Medication Sig Start Date End Date Taking? Authorizing Provider  cloZAPine (CLOZARIL) 100 MG tablet Take 1 tablet (100 mg total) by mouth daily. Patient taking differently: Take 400 mg by mouth daily. 02/27/20  Yes Garlon Hatchet, PA-C  divalproex (DEPAKOTE ER) 500 MG 24 hr tablet Take 1 tablet (500 mg total) by mouth at bedtime. Patient taking differently: Take 500 mg by mouth 2 (two) times daily. 02/27/20  Yes Garlon Hatchet, PA-C  gabapentin (NEURONTIN) 100 MG capsule Take 100 mg by mouth 2 (two) times daily.   Yes [provider]  gabapentin (NEURONTIN) 400 MG capsule Take 400 mg by mouth 3 (three) times daily. 06/17/21  Yes [provider]  propranolol (INDERAL) 10 MG tablet Take 1 tablet (10 mg total) by mouth daily. 02/27/20  Yes Garlon Hatchet, PA-C  apixaban (ELIQUIS) 5 MG TABS tablet 10 mg PO BID x 7 days then 5 mg PO BID to continue Patient not taking: Reported on 06/28/2021 09/19/17   Joycelyn Das, MD  aspirin EC 81 MG EC tablet Take 1 tablet (81 mg total) by mouth daily. Patient not taking: Reported on 06/28/2021 09/20/17   Joycelyn Das, MD  azaTHIOprine (IMURAN) 50 MG tablet Take 50 mg by mouth 2 (two) times daily. Patient not  taking: Reported on 06/28/2021 08/29/17   [provider]  ciclopirox (PENLAC) 8 % solution Apply one coat to each toenail daily. Remove with nail polish remover once every 7 days. Repeat. Patient not taking: Reported on 06/28/2021 07/01/19   Freddie Breech, DPM  omeprazole (PRILOSEC) 20 MG capsule Take 20 mg by mouth daily before breakfast.  Patient not taking: Reported on 06/28/2021    [provider]  Vitamin D, Ergocalciferol, (DRISDOL) 1.25 MG (50000 UNIT) CAPS capsule Take 50,000 Units by mouth once a week. Patient not taking: Reported on 06/28/2021 06/17/21   [provider]      Allergies    Patient has no known allergies.    Review of Systems   Review of Systems Ten systems reviewed and are negative for acute change, except as noted in the HPI.    Physical Exam Updated Vital Signs BP (!) 143/95    Pulse (!) 110    Temp 98.5 F (36.9 C) (Oral)    Resp 18    Ht 5\' 11"  (1.803 m)    Wt 97.5 kg    SpO2 95%    BMI 29.99 kg/m   Physical Exam Vitals and nursing note reviewed.  Constitutional:      General: He is not in acute distress.    Appearance: He is well-developed. He is not diaphoretic.     Comments: Nontoxic appearing and in  NAD  HENT:     Head: Normocephalic and atraumatic.  Eyes:     General: No scleral icterus.    Conjunctiva/sclera: Conjunctivae normal.  Pulmonary:     Effort: Pulmonary effort is normal. No respiratory distress.     Comments: Respirations even and unlabored Musculoskeletal:        General: Normal range of motion.     Cervical back: Normal range of motion.  Skin:    General: Skin is warm and dry.     Coloration: Skin is not pale.     Findings: No erythema or rash.  Neurological:     Mental Status: He is alert and oriented to person, place, and time.  Psychiatric:        Attention and Perception: He does not perceive auditory or visual hallucinations.        Behavior: Behavior is agitated.        Thought Content:  Thought content does not include suicidal ideation.     Comments: Mild agitation when asked about events leading up to ED presentation, but redirectable and not aggressive.    ED Results / Procedures / Treatments   Labs (all labs ordered are listed, but only abnormal results are displayed) Labs Reviewed  COMPREHENSIVE METABOLIC PANEL - Abnormal; Notable for the following components:      Result Value   CO2 21 (*)    Glucose, Bld 106 (*)    Creatinine, Ser 1.37 (*)    GFR, Estimated 59 (*)    All other components within normal limits  RESP PANEL BY RT-PCR (FLU A&B, COVID) ARPGX2  CBC WITH DIFFERENTIAL/PLATELET  ETHANOL  VALPROIC ACID LEVEL  RAPID URINE DRUG SCREEN, HOSP PERFORMED    EKG None  Radiology No results found.  Procedures Procedures    Medications Ordered in ED Medications  cloZAPine (CLOZARIL) tablet 400 mg (has no administration in time range)  divalproex (DEPAKOTE ER) 24 hr tablet 500 mg (has no administration in time range)  gabapentin (NEURONTIN) capsule 100 mg (has no administration in time range)  gabapentin (NEURONTIN) capsule 400 mg (has no administration in time range)  propranolol (INDERAL) tablet 10 mg (has no administration in time range)  haloperidol lactate (HALDOL) injection 5 mg (5 mg Intramuscular Given 06/28/21 0358)  diphenhydrAMINE (BENADRYL) injection 50 mg (50 mg Intramuscular Given 06/28/21 0358)    ED Course/ Medical Decision Making/ A&P Clinical Course as of 06/28/21 0636  Mon Jun 28, 2021  0243 Labs reviewed.  Stable compared to baseline on chart review.  Patient medically cleared. [KH]  [phone] Patient presently agitated, yelling at father.  Medications ordered for sedation. [KH]  [phone] Patient assessed by TTS who recommend inpatient treatment.  [facility] currently at capacity. [KH]    Clinical Course User Index [KH] Antony Madura, PA-C                           Medical Decision Making Amount and/or Complexity of Data Reviewed Labs:  ordered.  Risk Prescription drug management.   This patient presents to the ED for concern of agitation, this involves an extensive number of treatment options, and is a complaint that carries with it a high risk of complications and morbidity.  The differential diagnosis includes schizophrenia vs substance induced mood disorder vs medication noncompliance vs other mental health crisis.   Co morbidities that complicate the patient evaluation  Schizophrenia HTN COPD   Lab Tests:  I Ordered, and personally interpreted  labs.  The pertinent results include:  mild elevation of creatinine to 1.37; on chart review of old lab values, this historically seems to fluctuate between 1.06 and 1.38.   Medicines ordered and prescription drug management:  I ordered medication including IM Haldol and Benadryl for acute agitation  Reevaluation of the patient after these medicines showed that the patient improved I have reviewed the patients home medicines and have made adjustments as needed   Test Considered:  Acetaminophen level, salicylate level   Consultations Obtained:  I requested consultation with TTS,  and discussed lab and imaging findings as well as pertinent plan - they recommend: inpatient psych placement   Reevaluation:  After the interventions noted above, I reevaluated the patient and found that they have : remained stable   Social Determinants of Health:  Schizophrenia Good social support; father with patient in the ED   Dispostion:  61 year old male presents to the emergency department for psychiatric evaluation.  Is in the department voluntarily.  Was reportedly more aggressive and agitated with family tonight escalating to police being called.  Has a history of similar outbursts for which she has been hospitalized in the past.  The patient has been evaluated by TTS who recommend inpatient psychiatric placement.  [facility] at capacity.  Daily medications ordered pending  transfer to behavioral facility for ongoing management.        Final Clinical Impression(s) / ED Diagnoses Final diagnoses:  Aggressive behavior    Rx / DC Orders ED Discharge Orders     None         Antony Madura, PA-C 06/28/21 8916    Gilda Crease, MD 06/28/21 [id number]

## 2021-06-28 NOTE — ED Triage Notes (Signed)
Pt BIB EMS with reports of aggression. The police were called to the home because the pt was aggressive towards his family. Pt has schizophrenia. Ems states that pt was cooperative and calm with them.   180/90 bp  94 hr

## 2021-06-28 NOTE — ED Notes (Signed)
Pt is hyperverbal, talking to multiple voices internal

## 2021-06-28 NOTE — BH Assessment (Signed)
Comprehensive Clinical Assessment (CCA) Note  06/28/2021 Mark [name] ZO:[id number]  DISPOSITION: Gave clinical report to Mark Reichert, NP who determined Pt meets criteria for inpatient psychiatric treatment. Mark [name], [facility], confirms adult unit is at capacity. Notified Mark Breach, PA-C and Mark Rain, RN of recommendation via secure message.  The patient demonstrates the following risk factors for suicide: Chronic risk factors for suicide include: psychiatric disorder of schizophrenia . Acute risk factors for suicide include: family or marital conflict. Protective factors for this patient include: positive social support, positive therapeutic relationship, and life satisfaction. Considering these factors, the overall suicide risk at this point appears to be low. Patient is appropriate for outpatient follow up.  [place] ED from 06/28/2021 in [place] DEPT ED from 03/12/2020 in [place] DEPT ED from 02/27/2020 in [place] DEPT  C-SSRS RISK CATEGORY No Risk No Risk No Risk      Pt is a 61 year old male who presents unaccompanied to [name] voluntarily by Event organiser. Pt has a diagnosis of schizophrenia and police were called to Pt's home because Pt was being aggressive towards family. Pt has garbled speech and is difficult to understand. He appears agitated and says he is upset because people were trying to hold him down. He talks about wanting to fight people for putting hands on him. He says he takes medication, that he took it this morning, but that it does not help. He cannot remember the name of his outpatient provider and Pt's medical record indicates he has received medication management through [facility]. He says he has not been sleeping well. He denies current suicidal ideation. He denies experiencing auditory or visual hallucinations. He denies alcohol or other  substance use-- Pt's urine drug screen is in process.  TTS attempted to contact Pt's father, Mark [name], at [phone] and call went to unidentified voicemail. Pt's medical record indicates Pt has a history of verbal aggression and threats to others. Medical record indicates Pt does not have an intellectual disability.  Pt is dressed in hospital scrubs, alert and oriented to person, place, and situation. Pt speaks in a garbles tone, at moderate volume and normal pace. Motor behavior appears restless and Pt jerks his arms back and forth under a blanket. Eye contact is good. Pt's mood is anxious and agitated, affect is congruent with mood. Thought process is tangential at times. Pt was generally cooperative.  Chief Complaint:  Chief Complaint  Patient presents with   Aggressive Behavior   Visit Diagnosis: F20.9 Schizophrenia   CCA Screening, Triage and Referral (STR)  Patient Reported Information How did you hear about [place]? Family/Friend  Referral name: No data recorded Referral phone number: No data recorded  Whom do you see for routine medical problems? No data recorded Practice/Facility Name: No data recorded Practice/Facility Phone Number: No data recorded Name of Contact: No data recorded Contact Number: No data recorded Contact Fax Number: No data recorded Prescriber Name: No data recorded Prescriber Address (if known): No data recorded  What Is the Reason for Your Visit/Call Today? Pt has diagnosis of schizophrenia and family reports he has been agitated and required restraint. Pt appears to be agitated.  How Long Has This Been Causing You Problems? 1 wk - 1 month  What Do You Feel Would Help You the Most Today? Medication(s)   Have You Recently Been in Any Inpatient Treatment (Hospital/Detox/Crisis Center/28-Day Program)? No data recorded Name/Location of Program/Hospital:No data  recorded How Long Were You There? No data recorded When Were You Discharged?  No data recorded  Have You Ever Received Services From [facility] Before? No data recorded Who Do You See at [facility]? No data recorded  Have You Recently Had Any Thoughts About Hurting Yourself? No  Are You Planning to Commit Suicide/Harm Yourself At This time? No   Have you Recently Had Thoughts About [place]? Yes  Explanation: No data recorded  Have You Used Any Alcohol or Drugs in the Past 24 Hours? No  How Long Ago Did You Use Drugs or Alcohol? No data recorded What Did You Use and How Much? No data recorded  Do You Currently Have a Therapist/Psychiatrist? Yes  Name of Therapist/Psychiatrist: Pt is unsure of his provider's name.   Have You Been Recently Discharged From Any Office Practice or Programs? No  Explanation of Discharge From Practice/Program: No data recorded    CCA Screening Triage Referral Assessment Type of Contact: Tele-Assessment  Is this Initial or Reassessment? Initial Assessment  Date Telepsych consult ordered in [facility]:  06/28/21  Time Telepsych consult ordered in [facility]:  0120   Patient Reported Information Reviewed? No data recorded Patient Left Without Being Seen? No data recorded Reason for Not Completing Assessment: No data recorded  Collateral Involvement: Medical record   Does Patient Have a [place]? No data recorded Name and Contact of Legal Guardian: No data recorded If Minor and Not Living with Parent(s), Who has Custody? NA  Is CPS involved or ever been involved? Never  Is APS involved or ever been involved? Never   Patient Determined To Be At Risk for Harm To Self or Others Based on Review of Patient Reported Information or Presenting Complaint? Yes, for Harm to Others  Method: No Plan  Availability of Means: No access or NA  Intent: Vague intent or NA  Notification Required: Identifiable person is aware  Additional Information for Danger to Others Potential: Active  psychosis  Additional Comments for Danger to Others Potential: NA  Are There Guns or Other Weapons in Your Home? No  Types of Guns/Weapons: No data recorded Are These Weapons Safely Secured?                            No data recorded Who Could Verify You Are Able To Have These Secured: No data recorded Do You Have any Outstanding Charges, Pending Court Dates, Parole/Probation? Pt denies  Contacted To Inform of Risk of Harm To Self or Others: No data recorded  Location of Assessment: [facility] ED   Does Patient Present under Involuntary Commitment? No  IVC Papers Initial File Date: No data recorded  [place] of Residence: [place]   Patient Currently Receiving the Following Services: Medication Management   Determination of Need: Urgent (48 hours)   Options For Referral: Inpatient Hospitalization; Medication Management     CCA Biopsychosocial Intake/Chief Complaint:  No data recorded Current Symptoms/Problems: No data recorded  Patient Reported Schizophrenia/Schizoaffective Diagnosis in Past: Yes   Strengths: Pt has family support.  Preferences: No data recorded Abilities: No data recorded  Type of Services Patient Feels are Needed: No data recorded  Initial Clinical Notes/Concerns: No data recorded  Mental Health Symptoms Depression:   Change in energy/activity; Irritability; Sleep (too much or little)   Duration of Depressive symptoms:  Greater than two weeks   Mania:   Change in energy/activity; Irritability   Anxiety:  Difficulty concentrating; Irritability; Restlessness; Tension   Psychosis:   None   Duration of Psychotic symptoms: No data recorded  Trauma:   None   Obsessions:   None   Compulsions:   None   Inattention:   N/A   Hyperactivity/Impulsivity:   N/A   Oppositional/Defiant Behaviors:   N/A   Emotional Irregularity:   None   Other Mood/Personality Symptoms:   NA    Mental Status Exam Appearance and self-care   Stature:   Average   Weight:   Average weight   Clothing:   -- (Scrubs)   Grooming:   Normal   Cosmetic use:   None   Posture/gait:   Tense   Motor activity:   Restless   Sensorium  Attention:   Distractible   Concentration:   Anxiety interferes   Orientation:   X5   Recall/memory:  No data recorded  Affect and Mood  Affect:   Anxious   Mood:   Anxious; Irritable   Relating  Eye contact:   Normal   Facial expression:   Anxious   Attitude toward examiner:   Cooperative   Thought and Language  Speech flow:  Garbled   Thought content:   Appropriate to Mood and Circumstances   Preoccupation:   None   Hallucinations:   None   Organization:  No data recorded  [employer] of Knowledge:   Fair   Intelligence:   Average   Abstraction:   Normal   Judgement:   Poor   Reality Testing:   Variable   Insight:   Fair   Decision Making:   Impulsive   Social Functioning  Social Maturity:   Impulsive   Social Judgement:   Normal   Stress  Stressors:   Family conflict   Coping Ability:   Exhausted   Skill Deficits:   Environmental health practitioner; Self-control   Supports:   Family     Religion: Religion/Spirituality Are You A Religious Person?:  Special educational needs teacher)  Leisure/Recreation: Leisure / Recreation Do You Have Hobbies?:  (UTA)  Exercise/Diet: Exercise/Diet Do You Exercise?:  (UTA) Have You Gained or Lost A Significant Amount of Weight in the Past Six Months?:  (UTA) Do You Follow a Special Diet?:  (UTA) Do You Have Any Trouble Sleeping?: Yes Explanation of Sleeping Difficulties: Pt reports decreased sleep   CCA Employment/Education Employment/Work Situation: Employment / Work Situation Employment Situation: On disability Why is Patient on Disability: Schiaophrenia How Long has Patient Been on Disability: UTA Patient's Job has Been Impacted by Current Illness: No Has Patient ever Been in the [employer]?:   (UTA)  Education: Education Is Patient Currently Attending School?: No Last Grade Completed:  (UTA) Did You Attend College?:  (UTA) Did You Have An Individualized Education Program (IIEP):  (UTA) Did You Have Any Difficulty At School?:  (UTA) Patient's Education Has Been Impacted by Current Illness:  (UTA)   CCA Family/Childhood History Family and Relationship History: Family history Marital status: Single Does patient have children?:  (UTA)  Childhood History:  Childhood History By whom was/is the patient raised?: Both parents Did patient suffer any verbal/emotional/physical/sexual abuse as a child?:  (UTA) Did patient suffer from severe childhood neglect?:  (UTA) Has patient ever been sexually abused/assaulted/raped as an adolescent or adult?:  (UTA) Was the patient ever a victim of a crime or a disaster?:  (UTA) Witnessed domestic violence?:  (UTA) Has patient been affected by domestic violence as an adult?:  (UTA)  Child/Adolescent Assessment:  CCA Substance Use Alcohol/Drug Use: Alcohol / Drug Use Pain Medications: see MAR Prescriptions: see MAR Over the Counter: see MAR History of alcohol / drug use?: No history of alcohol / drug abuse Longest period of sobriety (when/how long): n/a                         ASAM's:  Six Dimensions of Multidimensional Assessment  Dimension 1:  Acute Intoxication and/or Withdrawal Potential:      Dimension 2:  Biomedical Conditions and Complications:      Dimension 3:  Emotional, Behavioral, or Cognitive Conditions and Complications:     Dimension 4:  Readiness to Change:     Dimension 5:  Relapse, Continued use, or Continued Problem Potential:     Dimension 6:  Recovery/Living Environment:     ASAM Severity Score:    ASAM Recommended Level of Treatment:     Substance use Disorder (SUD)    Recommendations for Services/Supports/Treatments:    DSM5 Diagnoses: Patient Active Problem List   Diagnosis Date  Noted   History of adenomatous polyp of colon 11/04/2018   Schizophrenia ([place]) 11/04/2018   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   DVT (deep venous thrombosis) ([place]) 09/15/2017   COPD (chronic obstructive pulmonary disease) ([place]) 09/15/2017   Hypertension 09/15/2017   Leukocytosis 09/15/2017   Bipolar disorder ([place]) 09/15/2017   Drug therapy 06/21/2017   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

## 2021-06-29 ENCOUNTER — Inpatient Hospital Stay (HOSPITAL_COMMUNITY)
Admission: AD | Admit: 2021-06-29 | Discharge: 2021-07-07 | DRG: 885 | Disposition: A | Discharge status: Home / Self Care | Payer: Medicaid Other | Source: Intra-hospital | Attending: Emergency Medicine | Admitting: Emergency Medicine

## 2021-06-29 DIAGNOSIS — T43596A Underdosing of other antipsychotics and neuroleptics, initial encounter: Secondary | ICD-10-CM | POA: Diagnosis not present

## 2021-06-29 DIAGNOSIS — J449 Chronic obstructive pulmonary disease, unspecified: Secondary | ICD-10-CM | POA: Diagnosis present

## 2021-06-29 DIAGNOSIS — R251 Tremor, unspecified: Secondary | ICD-10-CM | POA: Diagnosis not present

## 2021-06-29 DIAGNOSIS — Z91128 Patient's intentional underdosing of medication regimen for other reason: Secondary | ICD-10-CM | POA: Diagnosis not present

## 2021-06-29 DIAGNOSIS — I10 Essential (primary) hypertension: Secondary | ICD-10-CM | POA: Diagnosis present

## 2021-06-29 DIAGNOSIS — R12 Heartburn: Secondary | ICD-10-CM | POA: Diagnosis not present

## 2021-06-29 DIAGNOSIS — T50901A Poisoning by unspecified drugs, medicaments and biological substances, accidental (unintentional), initial encounter: Secondary | ICD-10-CM | POA: Diagnosis not present

## 2021-06-29 DIAGNOSIS — F209 Schizophrenia, unspecified: Principal | ICD-10-CM | POA: Diagnosis present

## 2021-06-29 DIAGNOSIS — T43591A Poisoning by other antipsychotics and neuroleptics, accidental (unintentional), initial encounter: Secondary | ICD-10-CM | POA: Diagnosis not present

## 2021-06-29 DIAGNOSIS — Z79899 Other long term (current) drug therapy: Secondary | ICD-10-CM

## 2021-06-29 DIAGNOSIS — F203 Undifferentiated schizophrenia: Secondary | ICD-10-CM | POA: Diagnosis not present

## 2021-06-29 LAB — HIV ANTIBODY (ROUTINE TESTING W REFLEX): HIV Screen 4th Generation wRfx: NONREACTIVE

## 2021-06-29 LAB — HEMOGLOBIN A1C
Hgb A1c MFr Bld: 6.1 % — ABNORMAL HIGH (ref 4.8–5.6)
Mean Plasma Glucose: 128 mg/dL

## 2021-06-29 LAB — VALPROIC ACID LEVEL: Valproic Acid Lvl: 41 ug/mL — ABNORMAL LOW (ref 50.0–100.0)

## 2021-06-29 LAB — AMMONIA: Ammonia: 26 umol/L (ref 9–35)

## 2021-06-29 MED ORDER — HALOPERIDOL LACTATE 5 MG/ML IJ SOLN
INTRAMUSCULAR | Status: AC
  Administered 2021-06-29: 2.5 mg
  Filled 2021-06-29: qty 1

## 2021-06-29 MED ORDER — LORAZEPAM 2 MG/ML IJ SOLN
INTRAMUSCULAR | Status: AC
  Filled 2021-06-29: qty 1

## 2021-06-29 MED ORDER — ZIPRASIDONE MESYLATE 20 MG IM SOLR
20.0000 mg | Freq: Two times a day (BID) | INTRAMUSCULAR | Status: DC | PRN
Start: 2021-06-29 — End: 2021-07-07
  Administered 2021-06-29 – 2021-07-06 (×2): 20 mg via INTRAMUSCULAR
  Filled 2021-06-29: qty 20

## 2021-06-29 MED ORDER — ALUM & MAG HYDROXIDE-SIMETH 200-200-20 MG/5ML PO SUSP: 30.0000 mL | ORAL | Status: DC | PRN

## 2021-06-29 MED ORDER — HALOPERIDOL LACTATE 5 MG/ML IJ SOLN
2.5000 mg | Freq: Once | INTRAMUSCULAR | Status: AC | PRN
  Administered 2021-06-29: 2.5 mg via INTRAMUSCULAR
  Filled 2021-06-29: qty 1

## 2021-06-29 MED ORDER — GABAPENTIN 400 MG PO CAPS
400.0000 mg | ORAL_CAPSULE | Freq: Three times a day (TID) | ORAL | Status: DC
  Administered 2021-06-30 – 2021-07-07 (×10): 400 mg via ORAL
  Filled 2021-06-29 (×32): qty 1

## 2021-06-29 MED ORDER — DIVALPROEX SODIUM ER 500 MG PO TB24
500.0000 mg | ORAL_TABLET | Freq: Two times a day (BID) | ORAL | Status: DC
  Administered 2021-06-30 – 2021-07-07 (×8): 500 mg via ORAL
  Filled 2021-06-29 (×23): qty 1

## 2021-06-29 MED ORDER — LORAZEPAM 1 MG PO TABS: 2.0000 mg | ORAL_TABLET | Freq: Once | ORAL | Status: AC

## 2021-06-29 MED ORDER — GABAPENTIN 100 MG PO CAPS
100.0000 mg | ORAL_CAPSULE | Freq: Two times a day (BID) | ORAL | Status: DC
Start: 2021-06-29 — End: 2021-06-30
  Administered 2021-06-29 – 2021-06-30 (×2): 100 mg via ORAL
  Filled 2021-06-29 (×8): qty 1

## 2021-06-29 MED ORDER — HALOPERIDOL LACTATE 5 MG/ML IJ SOLN: 2.5000 mg | Freq: Once | INTRAMUSCULAR | Status: AC

## 2021-06-29 MED ORDER — ACETAMINOPHEN 325 MG PO TABS
650.0000 mg | ORAL_TABLET | Freq: Four times a day (QID) | ORAL | Status: DC | PRN
  Administered 2021-07-06: 650 mg via ORAL
  Filled 2021-06-29: qty 2

## 2021-06-29 MED ORDER — LORAZEPAM 1 MG PO TABS
1.0000 mg | ORAL_TABLET | ORAL | Status: AC | PRN
  Administered 2021-07-03: 1 mg via ORAL
  Filled 2021-06-29: qty 1

## 2021-06-29 MED ORDER — MAGNESIUM HYDROXIDE 400 MG/5ML PO SUSP: 30.0000 mL | Freq: Every day | ORAL | Status: DC | PRN

## 2021-06-29 MED ORDER — LORAZEPAM 2 MG/ML IJ SOLN
2.0000 mg | Freq: Once | INTRAMUSCULAR | Status: AC
  Administered 2021-06-29: 2 mg via INTRAMUSCULAR

## 2021-06-29 MED ORDER — PROPRANOLOL HCL 10 MG PO TABS
10.0000 mg | ORAL_TABLET | Freq: Every day | ORAL | Status: DC
  Administered 2021-06-30 – 2021-07-07 (×4): 10 mg via ORAL
  Filled 2021-06-29 (×11): qty 1

## 2021-06-29 MED ORDER — LORAZEPAM 2 MG/ML IJ SOLN: 2.0000 mg | Freq: Once | INTRAMUSCULAR | Status: DC

## 2021-06-29 NOTE — Progress Notes (Signed)
Per report from previous nurse pt has only been responsive to pain during shift. Went to check on pt and he's starting to move around. His eyes are still closed and he's mumbling and his speech is incomprehensible with non purposeful arm movements.  No prn meds are ordered for this pt at this time. Due to history of aggressive behavior provider paged. Waiting for new orders.

## 2021-06-29 NOTE — TOC Initial Note (Signed)
Transition of Care [facility]) - Initial/Assessment Note    Patient Details  Name: HUNTINGTON LEVERICH MRN: [id number] Date of Birth: 1960-12-28  Transition of Care [facility]) CM/SW Contact:    Golda Acre, RN Phone Number: 06/29/2021, 8:30 AM  Clinical Narrative:                  Transition of Care [facility]) Screening Note   Patient Details  Name: OIVA DIBARI Date of Birth: [birth year]   Transition of Care [facility]) CM/SW Contact:    Golda Acre, RN Phone Number: 06/29/2021, 8:30 AM    Transition of Care Department [facility]) has reviewed patient and no TOC needs have been identified at this time. We will continue to monitor patient advancement through interdisciplinary progression rounds. If new patient transition needs arise, please place a TOC consult.    Expected Discharge Plan: Home/Self Care Barriers to Discharge: Continued Medical Work up   Patient Goals and CMS Choice Patient states their goals for this hospitalization and ongoing recovery are:: to go home CMS Medicare.gov Compare Post Acute Care list provided to:: Patient    Expected Discharge Plan and Services Expected Discharge Plan: Home/Self Care   Discharge Planning Services: CM Consult   Living arrangements for the past 2 months: Single Family Home                                      Prior Living Arrangements/Services Living arrangements for the past 2 months: Single Family Home Lives with:: Self Patient language and need for interpreter reviewed:: Yes Do you feel safe going back to the place where you live?: Yes            Criminal Activity/Legal Involvement Pertinent to Current Situation/Hospitalization: No - Comment as needed  Activities of Daily Living Home Assistive Devices/Equipment: None ADL Screening (condition at time of admission) Patient's cognitive ability adequate to safely complete daily activities?: No Is the patient deaf or have difficulty hearing?: No Does the patient  have difficulty seeing, even when wearing glasses/contacts?: No Does the patient have difficulty concentrating, remembering, or making decisions?: No Patient able to express need for assistance with ADLs?: No Does the patient have difficulty dressing or bathing?: Yes Independently performs ADLs?: No Communication:  (UTA) Dressing (OT):  (UTA) Grooming:  (UTA) Feeding:  (UTA) Bathing:  (UTA) Toileting:  (UTa) In/Out Bed:  (UTA) Walks in Home:  (UTA) Does the patient have difficulty walking or climbing stairs?: No Weakness of Legs: Both Weakness of Arms/Hands: Both  Permission Sought/Granted                  Emotional Assessment Appearance:: Appears stated age     Orientation: : Oriented to Place, Oriented to Self, Oriented to  Time, Oriented to Situation Alcohol / Substance Use: Illicit Drugs, Alcohol Use, Tobacco Use Psych Involvement: No (comment)  Admission diagnosis:  Drug overdose [T50.901A] Aggressive behavior [R46.89] Altered mental status, unspecified altered mental status type [R41.82] Patient Active Problem List   Diagnosis Date Noted   Drug overdose 06/28/2021   AMS (altered mental status) 06/28/2021   AKI (acute kidney injury) (HCC) 06/28/2021   Hyperglycemia 06/28/2021   History of adenomatous polyp of colon 11/04/2018   Schizophrenia (HCC) 11/04/2018   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 [place] [address] Phone: [phone] Fax: [phone]  [facility] [phone] - [place], [place] - [phone]  AT [facility] [address] & [address] [place] [address] Phone: [phone] Fax: [phone]     Social Determinants of Health (SDOH) Interventions    Readmission Risk Interventions No flowsheet data found.

## 2021-06-29 NOTE — Assessment & Plan Note (Signed)
Poison control was called, recommended supportive care. EKG normal, electrolytes normal.  SPO2 normal.

## 2021-06-29 NOTE — Progress Notes (Signed)
Pt been lethargic much of the shift due to receiving injections earlier    06/29/21 2200  Psych Admission Type (Psych Patients Only)  Admission Status Involuntary  Psychosocial Assessment  Patient Complaints None  Eye Contact Brief  Facial Expression Flat  Affect Labile  Speech Slurred;Soft  Interaction No initiation  Motor Activity Lethargic  Appearance/Hygiene Disheveled  Behavior Characteristics Unable to participate  Mood Preoccupied  Aggressive Behavior  Effect No apparent injury  Thought Process  Coherency Incoherent  Content Preoccupation  Delusions WDL  Perception WDL  Hallucination None reported or observed  Judgment Poor  Confusion Moderate  Danger to Self  Current suicidal ideation? Denies  Danger to Others  Danger to Others Reported or observed  Danger to Others Abnormal  Harmful Behavior to others Acts of violence towards other people observed   Destructive Behavior No threats or harm toward property  Description of Harmful Behavior lashing outl; rage

## 2021-06-29 NOTE — Progress Notes (Signed)
Patient ID: Mark [name], male   DOB: [birth year], 61 y.o.   MRN: [id number]   Pt is IVC'd and arrived to [facility] via [employer] transport for IP admission. Pt agitated and refused to get out of car. Pt was assisted to wheelchair and taken inside. Per NP order pt was given 30 mg Geodon and 2 mg Ativan via IM. Pt refused VS, and refused to sign admission paperwork, however, skin check was completed and witnessed by [facility], [facility]. Pt was taken to his room and given a sandwich tray. Water and ginger ale were also provided, per pt's request. Pt was calm in his room, ate, and is currently asleep, with even and unlabored respirations and no signs of distress noted. Pt remains safe on the unit.

## 2021-06-29 NOTE — Assessment & Plan Note (Signed)
Continue psych medications. Psych assessment completed.  Patient is transferred to inpatient psych.

## 2021-06-29 NOTE — Progress Notes (Incomplete)
[employer] arrived to transport pt to [facility]. Pt became verbally and physically aggressive towards officers and staff. MD and [facility] called to bedside, along with another [employer] officer. MD gave verbal order for 2.5 mg Haldol IM. Order carried out. The medication eventually took effect and the pt was transported in a wheelchair to the patrol vehicle. Ethelene Browns, RN at [facility] was notified of events and of the Haldol administration prior to leaving the unit.

## 2021-06-29 NOTE — Discharge Instructions (Signed)
Follow-up with primary care physician in 1 week. Patient is being discharged to behavioral health for inpatient psych care.

## 2021-06-29 NOTE — Assessment & Plan Note (Signed)
Likely prerenal secondary to diarrhea, dehydration. Continued IV hydration.  Serum creatinine has improved.

## 2021-06-29 NOTE — Progress Notes (Signed)
Pt became verbally and physically aggressive. RN notified MD of pt actions and verbal order for regular diet and haldol that was ordered overnight given. Meal tray ordered, pt given something to drink, and 2.5 mg IM Haldol was administered with staff assistance. Pt now laying in bed speaking incomprehensible speech.

## 2021-06-29 NOTE — TOC Transition Note (Signed)
Transition of Care [facility]) - CM/SW Discharge Note   Patient Details  Name: ADITH TEJADA MRN: [id number] Date of Birth: Feb 20, 1961  Transition of Care [facility]) CM/SW Contact:  Golda Acre, RN Phone Number: 06/29/2021, 1:51 PM   Clinical Narrative:    Non [place]ger[place] police transport called at [phone] for pt to go to [facility]   Final next level of care: Psychiatric Hospital Barriers to Discharge: Barriers Resolved   Patient Goals and CMS Choice Patient states their goals for this hospitalization and ongoing recovery are:: to go home CMS Medicare.gov Compare Post Acute Care list provided to:: Patient    Discharge Placement                       Discharge Plan and Services   Discharge Planning Services: CM Consult                                 Social Determinants of Health (SDOH) Interventions     Readmission Risk Interventions No flowsheet data found.

## 2021-06-29 NOTE — Progress Notes (Signed)
Patient ID: Mark [name], male   DOB: 20-Sep-1960, 61 y.o.   MRN: [id number]   Initial Treatment Plan 06/29/2021 6:13 PM [facility] [id number]    PATIENT STRESSORS: Medication change or noncompliance     PATIENT STRENGTHS: Supportive family/friends    PATIENT IDENTIFIED PROBLEMS: Psychosis  Non-med compliant  Irritability  Agitation               DISCHARGE CRITERIA:  Ability to meet basic life and health needs Improved stabilization in mood, thinking, and/or behavior Reduction of life-threatening or endangering symptoms to within safe limits Safe-care adequate arrangements made Verbal commitment to aftercare and medication compliance  PRELIMINARY DISCHARGE PLAN: Attend aftercare/continuing care group Outpatient therapy Return to previous living arrangement  PATIENT/FAMILY INVOLVEMENT: This treatment plan has been presented to and reviewed with the patient, Mark [name], and/or family member.  The patient and family have been given the opportunity to ask questions and make suggestions.  Harriet Masson, RN 06/29/2021, 6:13 PM

## 2021-06-29 NOTE — Progress Notes (Signed)
Adult Psychoeducational Group Note  Date:  06/29/2021 Time:  11:43 PM  Group Topic/Focus:  Wrap-Up Group:   The focus of this group is to help patients review their daily goal of treatment and discuss progress on daily workbooks.  Participation Level:  Did Not Attend  Participation Quality:   Did Not Attend  Affect:   Did Not Attend  Cognitive:   Did Not Attend  Insight: None  Engagement in Group:   Did Not Attend  Modes of Intervention:   Did Not Attend  Additional Comments:  Pt was encouraged to attend wrap up group but did not attend.  Felipa Furnace 06/29/2021, 11:43 PM

## 2021-06-29 NOTE — Progress Notes (Addendum)
Pt became very aggressive when lab tried to come and draw labs. Pt tried to get out of bed and became very aggressive towards staff. Security was called. PRN haloperidol was administered. Pt also began pulling off his cardiac monitor and trying to remove his IV. Provider made aware. Called ccmd to place pt on standby. IV fluids paused at this time.

## 2021-06-29 NOTE — Hospital Course (Signed)
This 61 years old male with PMH significant for schizophrenia, DVT presented in the ED with aggressive behavior.  History is obtained from chart review as patient was somnolent and altered at the time of presentation.  Apparently law enforcement was called to the patient's home due to aggressive behavior towards the family.  He was transported to the ED for further evaluation.  He was seen by psych and seemed appropriate for inpatient psych hospitalization.  While awaiting bed in the ED he was resumed on his home medications.  About 40 minutes after taking Clozaril patient became somnolent and it was difficult to arouse his blood pressure has dropped and he vomited.  He was given IV fluids.  CT head and chest x-ray was unremarkable.  Poison control was called.  Patient was admitted for AKI secondary to dehydration from nausea and vomiting.  Patient continued to remain aggressive requiring several doses of Haldol and benzodiazepines.  Acute kidney injury has resolved, renal functions has improved with IV hydration.  Patient is medically clear and patient is being discharged to psych facility for inpatient hospitalization.

## 2021-06-29 NOTE — Assessment & Plan Note (Addendum)
Patient was given Clozaril, since he has not been compliant on his medications, so when he actually received his regular dose it might be too much for now. Back to baseline but agitated.

## 2021-06-29 NOTE — Discharge Summary (Signed)
Physician Discharge Summary   Patient: Mark [name] MRN: [id number] DOB: [birth year]  Admit date:     06/28/2021  Discharge date: 06/29/21  Discharge Physician: Shawna Clamp   PCP: Nolene Ebbs, MD   Recommendations at discharge:  Follow-up with primary care physician in 1 week. Patient is being discharged to behavioral health for inpatient psych care.   Discharge Diagnoses: Principal Problem:   Drug overdose Active Problems:   AMS (altered mental status)   AKI (acute kidney injury) ([place])   Hyperglycemia  Resolved Problems:   * No resolved hospital problems. [facility] Hospital Course: This 61 years old male with PMH significant for schizophrenia, DVT presented in the ED with aggressive behavior.  History is obtained from chart review as patient was somnolent and altered at the time of presentation.  Apparently law enforcement was called to the patient's home due to aggressive behavior towards the family.  He was transported to the ED for further evaluation.  He was seen by psych and seemed appropriate for inpatient psych hospitalization.  While awaiting bed in the ED he was resumed on his home medications.  About 40 minutes after taking Clozaril patient became somnolent and it was difficult to arouse his blood pressure has dropped and he vomited.  He was given IV fluids.  CT head and chest x-ray was unremarkable.  Poison control was called.  Patient was admitted for AKI secondary to dehydration from nausea and vomiting.  Patient continued to remain aggressive requiring several doses of Haldol and benzodiazepines.  Acute kidney injury has resolved, renal functions has improved with IV hydration.  Patient is medically clear and patient is being discharged to psych facility for inpatient hospitalization.  Assessment and Plan: * Drug overdose- (present on admission) Poison control was called, recommended supportive care. EKG normal, electrolytes normal.  SPO2 normal.  AKI (acute kidney  injury) ([place]) Likely prerenal secondary to diarrhea, dehydration. Continued IV hydration.  Serum creatinine has improved.  AMS (altered mental status) Patient was given Clozaril, since he has not been compliant on his medications, so when he actually received his regular dose it might be too much for now. Back to baseline but agitated.  Schizophrenia ([place]) Continue psych medications. Psych assessment completed.  Patient is transferred to inpatient psych.   Pain control - [employer] Controlled Substance Reporting System database was reviewed. and patient was instructed, not to drive, operate heavy machinery, perform activities at heights, swimming or participation in water activities or provide baby-sitting services while on Pain, Sleep and Anxiety Medications; until their outpatient Physician has advised to do so again. Also recommended to not to take more than prescribed Pain, Sleep and Anxiety Medications.   Consultants: Psychiatry Procedures performed: None.  Disposition:  Psych facility Diet recommendation:  Discharge Diet Orders (From admission, onward)     Start     Ordered   06/29/21 0000  Diet - low sodium heart healthy        06/29/21 1153   06/29/21 0000  Diet Carb Modified        06/29/21 1153           Cardiac diet  DISCHARGE MEDICATION: Allergies as of 06/29/2021   No Known Allergies      Medication List     STOP taking these medications    apixaban 5 MG Tabs tablet Commonly known as: Eliquis   aspirin 81 MG EC tablet   azaTHIOprine 50 MG tablet Commonly known as: IMURAN   ciclopirox 8 %  solution Commonly known as: PENLAC   omeprazole 20 MG capsule Commonly known as: PRILOSEC       TAKE these medications    cloZAPine 100 MG tablet Commonly known as: Clozaril Take 1 tablet (100 mg total) by mouth daily. What changed: how much to take   divalproex 500 MG 24 hr tablet Commonly known as: Depakote ER Take 1 tablet (500 mg total) by  mouth at bedtime. What changed: when to take this   gabapentin 400 MG capsule Commonly known as: NEURONTIN Take 400 mg by mouth 3 (three) times daily. What changed: Another medication with the same name was removed. Continue taking this medication, and follow the directions you see here.   propranolol 10 MG tablet Commonly known as: INDERAL Take 1 tablet (10 mg total) by mouth daily.   Vitamin D (Ergocalciferol) 1.25 MG (50000 UNIT) Caps capsule Commonly known as: DRISDOL Take 50,000 Units by mouth once a week.        Follow-up Information     Nolene Ebbs, MD Follow up in 1 week(s).   Specialty: Internal Medicine Contact information: [place] [address] [phone]                 Discharge Exam: Filed Weights   06/28/21 0109 06/28/21 1927  Weight: 97.5 kg 78.6 kg   General exam: Alert, oriented x 2, agitated and restless. Respiratory system: Clear to auscultation bilaterally, no wheezing, no crackles. Cardiovascular system: S1-S2 heard, regular rate and rhythm, no murmur. Gastrointestinal system: .  Soft, nontender, nondistended, BS+ Central nervous system: Alert and oriented x 2 , able to move 4 extremities. Extremities: No edema, no cyanosis, no clubbing. Psychiatry: Agitated, restless, short temper, no psychosis.   Condition at discharge: good  The results of significant diagnostics from this hospitalization (including imaging, microbiology, ancillary and laboratory) are listed below for reference.   Imaging Studies: CT Head Wo Contrast  Result Date: 06/28/2021 CLINICAL DATA:  Mental status change, unknown cause EXAM: CT HEAD WITHOUT CONTRAST TECHNIQUE: Contiguous axial images were obtained from the base of the skull through the vertex without intravenous contrast. RADIATION DOSE REDUCTION: This exam was performed according to the departmental dose-optimization program which includes automated exposure control, adjustment of the mA  and/or kV according to patient size and/or use of iterative reconstruction technique. COMPARISON:  CT 02/27/2020 FINDINGS: Brain: No evidence of acute intracranial hemorrhage or extra-axial collection.No evidence of mass lesion/concern mass effect.The ventricles are normal in size. Vascular: No hyperdense vessel or unexpected calcification. Skull: Negative for fracture. Sinuses/Orbits: No acute finding. Other: None. IMPRESSION: No acute intracranial abnormality. Electronically Signed   By: Maurine Simmering M.D.   On: 06/28/2021 13:36   DG Chest Port 1 View  Result Date: 06/28/2021 CLINICAL DATA:  Shortness of breath EXAM: PORTABLE CHEST 1 VIEW COMPARISON:  Radiograph 02/27/2020 FINDINGS: Unchanged cardiomediastinal silhouette. No focal airspace consolidation. There is no large pleural effusion. There is no visible pneumothorax. There is no acute osseous abnormality. IMPRESSION: No evidence of acute cardiopulmonary disease. Electronically Signed   By: Maurine Simmering M.D.   On: 06/28/2021 15:53    Microbiology: Results for orders placed or performed during the hospital encounter of 06/28/21  Resp Panel by RT-PCR (Flu A&B, Covid) Nasopharyngeal Swab     Status: None   Collection Time: 06/28/21  1:20 AM   Specimen: Nasopharyngeal Swab; Nasopharyngeal(NP) swabs in vial transport medium  Result Value Ref Range Status   SARS Coronavirus 2 by RT PCR NEGATIVE  NEGATIVE Final    Comment: (NOTE) SARS-CoV-2 target nucleic acids are NOT DETECTED.  The SARS-CoV-2 RNA is generally detectable in upper respiratory specimens during the acute phase of infection. The lowest concentration of SARS-CoV-2 viral copies this assay can detect is 138 copies/mL. A negative result does not preclude SARS-Cov-2 infection and should not be used as the sole basis for treatment or other patient management decisions. A negative result may occur with  improper specimen collection/handling, submission of specimen other than nasopharyngeal  swab, presence of viral mutation(s) within the areas targeted by this assay, and inadequate number of viral copies(<138 copies/mL). A negative result must be combined with clinical observations, patient history, and epidemiological information. The expected result is Negative.  Fact Sheet for Patients:  EntrepreneurPulse.com.au  Fact Sheet for Healthcare Providers:  IncredibleEmployment.be  This test is no t yet approved or cleared by the [place] FDA and  has been authorized for detection and/or diagnosis of SARS-CoV-2 by FDA under an Emergency Use Authorization (EUA). This EUA will remain  in effect (meaning this test can be used) for the duration of the COVID-19 declaration under Section 564(b)(1) of the Act, 21 U.S.C.section 360bbb-3(b)(1), unless the authorization is terminated  or revoked sooner.       Influenza A by PCR NEGATIVE NEGATIVE Final   Influenza B by PCR NEGATIVE NEGATIVE Final    Comment: (NOTE) The Xpert Xpress SARS-CoV-2/FLU/RSV plus assay is intended as an aid in the diagnosis of influenza from Nasopharyngeal swab specimens and should not be used as a sole basis for treatment. Nasal washings and aspirates are unacceptable for Xpert Xpress SARS-CoV-2/FLU/RSV testing.  Fact Sheet for Patients: EntrepreneurPulse.com.au  Fact Sheet for Healthcare Providers: IncredibleEmployment.be  This test is not yet approved or cleared by the [place] FDA and has been authorized for detection and/or diagnosis of SARS-CoV-2 by FDA under an Emergency Use Authorization (EUA). This EUA will remain in effect (meaning this test can be used) for the duration of the COVID-19 declaration under Section 564(b)(1) of the Act, 21 U.S.C. section 360bbb-3(b)(1), unless the authorization is terminated or revoked.  Performed at [facility], [place] [address] [place], [place] [address]    Culture, blood (routine x 2)     Status: None (Preliminary result)   Collection Time: 06/28/21  2:41 PM   Specimen: BLOOD  Result Value Ref Range Status   Specimen Description   Final    BLOOD RIGHT ANTECUBITAL Performed at [place] [address], [place], [place] [address]    Special Requests   Final    BOTTLES DRAWN AEROBIC AND ANAEROBIC Blood Culture adequate volume Performed at [place] [address] [place], [place] [address]    Culture   Final    NO GROWTH < 24 HOURS Performed at [facility] [address], [place], [place] [address]    Report Status PENDING  Incomplete    Labs: CBC: Recent Labs  Lab 06/28/21 0140 06/28/21 1358 06/28/21 2046  WBC 9.7 8.5 10.5  NEUTROABS 6.3 4.5  --   HGB 15.2 14.4 14.7  HCT 48.3 45.4 47.3  MCV 89.6 91.3 92.0  PLT 305 276 [id number]   Basic Metabolic Panel: Recent Labs  Lab 06/28/21 0140 06/28/21 1358 06/28/21 2046  NA 142 141  --   K 4.1 4.0  --   CL 109 110  --   CO2 21* 24  --   GLUCOSE 106* 190*  --  BUN 15 16  --   CREATININE 1.37* 1.40* 1.17  CALCIUM 10.2 9.9  --    Liver Function Tests: Recent Labs  Lab 06/28/21 0140  AST 26  ALT 15  ALKPHOS 61  BILITOT 0.5  PROT 7.7  ALBUMIN 4.0   CBG: Recent Labs  Lab 06/28/21 1241  GLUCAP 142*    Discharge time spent: greater than 30 minutes.  Signed: Shawna Clamp, MD [facility] 06/29/2021

## 2021-06-30 ENCOUNTER — Encounter (HOSPITAL_COMMUNITY): Payer: Self-pay

## 2021-06-30 DIAGNOSIS — F203 Undifferentiated schizophrenia: Secondary | ICD-10-CM

## 2021-06-30 MED ORDER — OLANZAPINE 5 MG PO TBDP
5.0000 mg | ORAL_TABLET | Freq: Two times a day (BID) | ORAL | Status: DC
  Administered 2021-06-30: 5 mg via ORAL
  Filled 2021-06-30 (×8): qty 1

## 2021-06-30 MED ORDER — TRAZODONE HCL 50 MG PO TABS
50.0000 mg | ORAL_TABLET | Freq: Every evening | ORAL | Status: DC | PRN
  Administered 2021-07-01 – 2021-07-06 (×4): 50 mg via ORAL
  Filled 2021-06-30 (×6): qty 1
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
## 2021-06-30 NOTE — BH IP Treatment Plan (Signed)
Interdisciplinary Treatment and Diagnostic Plan Update  06/30/2021 Time of Session: 9:20am  Mark [name] MRN: [id number]  Principal Diagnosis: Schizophrenia [facility])  Secondary Diagnoses: Principal Problem:   Schizophrenia ([place])   Current Medications:  Current Facility-Administered Medications  Medication Dose Route Frequency Provider Last Rate Last Admin   acetaminophen (TYLENOL) tablet 650 mg  650 mg Oral Q6H PRN Starkes-Perry, Gayland Curry, FNP       alum & mag hydroxide-simeth (MAALOX/MYLANTA) 200-200-20 MG/5ML suspension 30 mL  30 mL Oral Q4H PRN Starkes-Perry, Gayland Curry, FNP       divalproex (DEPAKOTE ER) 24 hr tablet 500 mg  500 mg Oral BID Suella Broad, FNP   500 mg at 06/30/21 4098   gabapentin (NEURONTIN) capsule 100 mg  100 mg Oral BID Suella Broad, FNP   100 mg at 06/30/21 1000   gabapentin (NEURONTIN) capsule 400 mg  400 mg Oral TID Suella Broad, FNP   400 mg at 06/30/21 1191   ziprasidone (GEODON) injection 20 mg  20 mg Intramuscular Q12H PRN Suella Broad, FNP   20 mg at 06/29/21 1615   And   LORazepam (ATIVAN) tablet 1 mg  1 mg Oral PRN Suella Broad, FNP       magnesium hydroxide (MILK OF MAGNESIA) suspension 30 mL  30 mL Oral Daily PRN Starkes-Perry, Gayland Curry, FNP       propranolol (INDERAL) tablet 10 mg  10 mg Oral Daily Suella Broad, FNP   10 mg at 06/30/21 4782   PTA Medications: Medications Prior to Admission  Medication Sig Dispense Refill Last Dose   cloZAPine (CLOZARIL) 100 MG tablet Take 1 tablet (100 mg total) by mouth daily. (Patient taking differently: Take 400 mg by mouth daily.) 60 tablet 0    divalproex (DEPAKOTE ER) 500 MG 24 hr tablet Take 1 tablet (500 mg total) by mouth at bedtime. (Patient taking differently: Take 500 mg by mouth 2 (two) times daily.) 30 tablet 0    gabapentin (NEURONTIN) 400 MG capsule Take 400 mg by mouth 3 (three) times daily.      propranolol (INDERAL) 10 MG tablet Take 1  tablet (10 mg total) by mouth daily. 30 tablet 0    Vitamin D, Ergocalciferol, (DRISDOL) 1.25 MG (50000 UNIT) CAPS capsule Take 50,000 Units by mouth once a week. (Patient not taking: Reported on 06/28/2021)       Patient Stressors: Medication change or noncompliance    Patient Strengths: Supportive family/friends   Treatment Modalities: Medication Management, Group therapy, Case management,  1 to 1 session with clinician, Psychoeducation, Recreational therapy.   Physician Treatment Plan for Primary Diagnosis: Schizophrenia ([place]) Long Term Goal(s):     Short Term Goals:    Medication Management: Evaluate patient's response, side effects, and tolerance of medication regimen.  Therapeutic Interventions: 1 to 1 sessions, Unit Group sessions and Medication administration.  Evaluation of Outcomes: Not Met  Physician Treatment Plan for Secondary Diagnosis: Principal Problem:   Schizophrenia ([place])  Long Term Goal(s):     Short Term Goals:       Medication Management: Evaluate patient's response, side effects, and tolerance of medication regimen.  Therapeutic Interventions: 1 to 1 sessions, Unit Group sessions and Medication administration.  Evaluation of Outcomes: Not Met   RN Treatment Plan for Primary Diagnosis: Schizophrenia ([place]) Long Term Goal(s): Knowledge of disease and therapeutic regimen to maintain health will improve  Short Term Goals: Ability to remain free from injury will improve, Ability  to participate in decision making will improve, Ability to verbalize feelings will improve, Ability to disclose and discuss suicidal ideas, and Ability to identify and develop effective coping behaviors will improve  Medication Management: RN will administer medications as ordered by provider, will assess and evaluate patient's response and provide education to patient for prescribed medication. RN will report any adverse and/or side effects to prescribing provider.  Therapeutic  Interventions: 1 on 1 counseling sessions, Psychoeducation, Medication administration, Evaluate responses to treatment, Monitor vital signs and CBGs as ordered, Perform/monitor CIWA, COWS, AIMS and Fall Risk screenings as ordered, Perform wound care treatments as ordered.  Evaluation of Outcomes: Not Met   LCSW Treatment Plan for Primary Diagnosis: Schizophrenia ([place]) Long Term Goal(s): Safe transition to appropriate next level of care at discharge, Engage patient in therapeutic group addressing interpersonal concerns.  Short Term Goals: Engage patient in aftercare planning with referrals and resources, Increase social support, Increase emotional regulation, Facilitate acceptance of mental health diagnosis and concerns, Identify triggers associated with mental health/substance abuse issues, and Increase skills for wellness and recovery  Therapeutic Interventions: Assess for all discharge needs, 1 to 1 time with Social worker, Explore available resources and support systems, Assess for adequacy in community support network, Educate family and significant other(s) on suicide prevention, Complete Psychosocial Assessment, Interpersonal group therapy.  Evaluation of Outcomes: Not Met   Progress in Treatment: Attending groups: Yes. Participating in groups: Yes. Taking medication as prescribed: Yes. Toleration medication: Yes. Family/Significant other contact made: Yes, individual(s) contacted:  Father  Patient understands diagnosis: No. Discussing patient identified problems/goals with staff: Yes. Medical problems stabilized or resolved: Yes. Denies suicidal/homicidal ideation: Yes. Issues/concerns per patient self-inventory: No.   New problem(s) identified: No, Describe:  None   New Short Term/Long Term Goal(s): medication stabilization, elimination of SI thoughts, development of comprehensive mental wellness plan.   Patient Goals: "To go home"   Discharge Plan or Barriers: Patient  recently admitted. CSW will continue to follow and assess for appropriate referrals and possible discharge planning.   Reason for Continuation of Hospitalization: Aggression Delusions  Depression Medication stabilization  Estimated Length of Stay: 3 to 5 days    Scribe for Treatment Team: Darleen Crocker, Latanya Presser 06/30/2021 1:51 PM

## 2021-06-30 NOTE — Progress Notes (Signed)
Patient ID: Mark [name], male   DOB: 05-Mar-1961, 61 y.o.   MRN: [id number]    Pt refused evening labs draws.

## 2021-06-30 NOTE — Progress Notes (Signed)
Adult Psychoeducational Group Note  Date:  06/30/2021 Time:  11:47 PM  Group Topic/Focus:  Wrap-Up Group:   The focus of this group is to help patients review their daily goal of treatment and discuss progress on daily workbooks.  Participation Level:  Minimal  Participation Quality:  Appropriate  Affect:  Anxious, Flat, and Irritable  Cognitive:  Disorganized and Confused  Insight: Lacking and Limited  Engagement in Group:  Lacking, Limited, and Poor  Modes of Intervention:  Discussion  Additional Comments:  Pt stated his goal for today was to focus on his treatment plan. Pt stated he accomplished his goal today. Pt stated he talked with his doctor and social worker about his care today. Pt rated his overall day a 10. Pt stated he made no calls today. Pt stated he felt better about himself today. Pt stated he was able to attend all meals. Pt stated he took all medications provided today.  Pt stated his appetite was pretty good today. Pt rated sleep last night was fair. Pt stated the goal tonight was to get some rest. Pt stated he had no physical pain tonight. Pt admitted to dealing with visual hallucinations and auditory issues tonight. Pt nurse was updated on the situation. Pt denies thoughts of harming himself or others. Pt stated he would alert staff if anything changed  Felipa Furnace 06/30/2021, 11:47 PM

## 2021-06-30 NOTE — Progress Notes (Signed)
Pharmacy info:  Clozapine   Clozapine listed as home med Pt has sporadic blood work in clozapine rems and medication NOT Filled due to lack of blood work per [facility] pharmacy at [phone]   last fill 04/26/21  Clozapine 100 with directions of 3 tabs bid #180   Rx called in but not filled due to lack of blood work with different directions  Clozapine 200   2 tablets bid    [place] id [id number] Obtained RDA for hospital dispensing  [id number] if required during admission     Peggye Fothergill, Pharm D

## 2021-06-30 NOTE — Group Note (Signed)
LCSW Group Therapy Note   Group Date: 06/30/2021 Start Time: 1300 End Time: 1400   Type of Therapy and Topic:  Group Therapy:  Strengths Exploration   Participation Level: Did Not Attend  Description of Group: This group allows individuals to explore their strengths, learn to use strengths in new ways to improve well-being. Strengths-based interventions involve identifying strengths, understanding how they are used, and learning new ways to apply them. Individuals will identify their strengths, and then explore their roles in different areas of life (relationships, professional life, and personal fulfillment). Individuals will think about ways in which they currently use their strengths, along with new ways they could begin using them.    Therapeutic Goals Patient will verbalize two of their strengths Patient will identify how their strengths are currently used Patient will identify two new ways to apply their strengths  Patients will create a plan to apply their strengths in their daily lives     Summary of Patient Progress:  Did not attend       Therapeutic Modalities Cognitive Behavioral Therapy Motivational Interviewing

## 2021-06-30 NOTE — Progress Notes (Signed)
°   06/30/21 1023  Psych Admission Type (Psych Patients Only)  Admission Status Involuntary  Psychosocial Assessment  Patient Complaints None  Eye Contact Brief  Facial Expression Flat  Affect Irritable  Speech Pressured;Incoherent  Interaction Isolative  Motor Activity Slow  Appearance/Hygiene Disheveled  Behavior Characteristics Unable to participate  Mood Preoccupied  Thought Process  Coherency Incoherent  Content Preoccupation  Delusions None reported or observed  Perception WDL  Hallucination None reported or observed  Judgment Poor  Confusion None  Danger to Self  Current suicidal ideation? Denies  Danger to Others  Danger to Others Reported or observed  Danger to Others Abnormal  Harmful Behavior to others No threats or harm toward other people  Destructive Behavior No threats or harm toward property

## 2021-06-30 NOTE — Plan of Care (Signed)
°  Problem: Safety: Goal: Periods of time without injury will increase Outcome: Progressing   Problem: Education: Goal: Will be free of psychotic symptoms Outcome: Not Progressing   Problem: Education: Goal: Knowledge of the prescribed therapeutic regimen will improve Outcome: Progressing   Problem: Nutritional: Goal: Ability to achieve adequate nutritional intake will improve Outcome: Progressing   Problem: Role Relationship: Goal: Ability to communicate needs accurately will improve Outcome: Progressing Note: Pt asked for a wash cloth, water, and ginger ale.

## 2021-06-30 NOTE — Group Note (Signed)
Recreation Therapy Group Note   Group Topic:Leisure Education  Group Date: 06/30/2021 Start Time: 1000 End Time: [id number] Facilitators: Victorino Sparrow, LRT,CTRS Location: 500 [address] Dayroom   Goal Area(s) Addresses:  Patient will successfully identify positive leisure and recreation activities.  Patient will acknowledge benefits of participation in healthy leisure activities post discharge.    Group Description:  Therapeutic Movie.  LRT and patients watched a movie about adapting to a different environment without losing your sense of self.  It also emphasized making the most of what you've learned in the new setting while at the same time making a positive impact.  The activity also showed that leisure doesn't always cost money or needs to be something extravagant.  It can be something simple to let you escape and mentally take a break from all of life's stresses.   Affect/Mood: N/A   Participation Level: Did not attend    Clinical Observations/Individualized Feedback:     Plan: Continue to engage patient in RT group sessions 2-3x/week.   Victorino Sparrow, LRT,CTRS 06/30/2021 1:47 PM
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
## 2021-07-01 ENCOUNTER — Encounter (HOSPITAL_COMMUNITY): Payer: Self-pay | Admitting: Nurse Practitioner

## 2021-07-01 NOTE — Progress Notes (Signed)
D:  Mark [name] was in the day room much of the evening.  Minimal with interactions.  He was irritable and guarded.  His speech remained garbled and difficult to understand.  He denied SI/HI or AVH.  He refused HS medications stating "it makes me too sleepy."  He refused EKG.  He did not fall asleep until 1am.   A:  1:1 with RN for support and encouragement.  Medications given as ordered.  Q 15 minute checks maintained for safety.  Encouraged participation in group and unit activities.   R:  He is currently resting with his eyes closed and appears to be asleep.  He remains safe on the unit.

## 2021-07-01 NOTE — BHH Suicide Risk Assessment (Signed)
BHH INPATIENT:  Family/Significant Other Suicide Prevention Education  Suicide Prevention Education:  Contact Attempts: father Jadd Gasior [phone]), has been identified by the patient as the family member/significant other with whom the patient will be residing, and identified as the person(s) who will aid the patient in the event of a mental health crisis.  With written consent from the patient, two attempts were made to provide suicide prevention education, prior to and/or following the patient's discharge.  We were unsuccessful in providing suicide prevention education.  A suicide education pamphlet was given to the patient to share with family/significant other.  Date and time of first attempt:07/01/21 /10:29am CSW left HIPAA  compliant voicemail requesting a return call.  Date and time of second attempt: Second attempt still needs to be made.   Mark [name] A Emmet Messer 07/01/2021, 10:30 AM

## 2021-07-01 NOTE — Group Note (Signed)
Recreation Therapy Group Note   Group Topic:Team Building  Group Date: 07/01/2021 Start Time: 7062 End Time: 1025 Facilitators: Caroll Rancher, LRT,CTRS Location: 500 Hall Dayroom   Goal Area(s) Addresses:  Patient will effectively work with peer towards shared goal.  Patient will identify skills used to make activity successful.  Patient will identify how skills used during activity can be applied to reach post d/c goals.   Group Description: [employer]. In teams of 5-6, patients were given 15 craft pipe cleaners. Using the materials provided, patients were instructed to compete again the opposing team(s) to build the tallest free-standing structure from floor level. The activity was timed; difficulty increased by Clinical research associate as Production designer, theatre/television/film continued.  Systematically resources were removed with additional directions for example, placing one arm behind their back, working in silence, and shape stipulations. LRT facilitated post-activity discussion reviewing team processes and necessary communication skills involved in completion. Patients were encouraged to reflect how the skills utilized, or not utilized, in this activity can be incorporated to positively impact support systems post discharge.   Affect/Mood: N/A   Participation Level: Did not attend    Clinical Observations/Individualized Feedback:      Plan: Continue to engage patient in RT group sessions 2-3x/week.   Caroll Rancher, LRT,CTRS 07/01/2021 11:14 AM

## 2021-07-01 NOTE — Progress Notes (Addendum)
D:  Mark [name] has been isolative to his room all evening.  He would not get up to take his bedtime medications.  He did request something for sleep but it has not been effective. He denied SI/HI or AVH.  He was irritable and minimal with conversation.   A:  1:1 with RN for support and encouragement.  Medications given as ordered.  Q 15 minute checks maintained for safety.  Encouraged participation in group and unit activities.   R:  He is currently laying quietly in his bed.  He is calm and composed.  He remains safe on the unit.  We will continue to monitor the progress towards his goals.

## 2021-07-01 NOTE — BHH Group Notes (Signed)
Adult Psychoeducational Group Note  Date:  07/01/2021 Time:  8:21 PM  Group Topic/Focus:  Wrap-Up Group:   The focus of this group is to help patients review their daily goal of treatment and discuss progress on daily workbooks.  Participation Level:  Minimal  Participation Quality:  Appropriate  Affect:  Flat  Cognitive:  Disorganized  Insight: Lacking  Engagement in Group:  Limited  Modes of Intervention:  Discussion  Additional Comments:  Patient attended and participated in the Wrap-up group.  Jearl Klinefelter 07/01/2021, 8:21 PM
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
## 2021-07-01 NOTE — Progress Notes (Signed)
Dar Note: Patient presents with irritable affect and mood.  Refused prescribed medications after several attempts and encouragement.  MD made aware.  Patient is withdrawn and isolates to his room.  Patient is malodorous and unkempt.  Routine safety checks maintained.  Patient is safe on the unit.

## 2021-07-01 NOTE — Progress Notes (Signed)
Pt was encouraged but didn't attend orientation/goals group. ?

## 2021-07-01 NOTE — Progress Notes (Signed)
[facility] MD Progress Note  07/01/2021 3:30 PM Mark [name]  MRN:  [id number] Subjective:   Mark [name] is a 61 yr old male who presented to [facility] on 2/20 voluntarily by law enforcement due to complaints he was aggressive towards his family, he was admitted to [facility] on 2/22.  PPHx is significant for Schizophrenia.    Case was discussed in the multidisciplinary team. MAR was reviewed and patient was not compliant with medications taking only his evening Zyprexa and Propanolol this morning.    Psychiatric Team made the following recommendations yesterday: -Continue Zyprexa 5 mg BID -Continue Gabapentin 400 mg TID -Continue Depakote ER 500 mg BID -Continue Agitation Protocol: Geodon/Ativan    Attempted to interview the patient today and patient continues to mumble/slur and there is a strong odor in the room.  He was able to report no SI or HI.  When asked about hallucinations he reported visual hallucinations but when asked what they are he stated "I can't tell you."  When asked about thought insertion/deletion and broadcasting he responds with yes.  When asked if he was having any side effects from his medications he stated no.  When asked why he was refusing his medications he replied with something along the lines of- he was sleeping and when he closes his eyes they think he is sleeping.  When asked if he slept well last night he responded with yes.  When asked about his appetite he states that the food at the hospital is not to his liking.    He then asked if he could have more ice cream or pudding.    Encouraged him to take his medications.  He reports no other concerns at present.   Principal Problem: Schizophrenia ([place]) Diagnosis: Principal Problem:   Schizophrenia ([place])  Total Time spent with patient:  I personally spent 25 minutes on the unit in direct patient care. The direct patient care time included face-to-face time with the patient, reviewing the patient's chart,  communicating with other professionals, and coordinating care. Greater than 50% of this time was spent in counseling or coordinating care with the patient regarding goals of hospitalization, psycho-education, and discharge planning needs.   Past Psychiatric History: Schizophrenia  Past Medical History:  Past Medical History:  Diagnosis Date   COPD (chronic obstructive pulmonary disease) ([place])    Hypertension    Schizophrenia ([place])     Past Surgical History:  Procedure Laterality Date   LOWER EXTREMITY ANGIOGRAPHY N/A 09/18/2017   Procedure: LOWER EXTREMITY ANGIOGRAPHY- Recheck lysis;  Surgeon: Waynetta Sandy, MD;  Location: [place] [facility];  Service: Cardiovascular;  Laterality: N/A;   PERIPHERAL VASCULAR INTERVENTION Left 09/18/2017   Procedure: PERIPHERAL VASCULAR INTERVENTION;  Surgeon: Waynetta Sandy, MD;  Location: [place] [facility];  Service: Cardiovascular;  Laterality: Left;   THROMBECTOMY FEMORAL ARTERY Left 09/17/2017   Procedure: POSSIBLE THROMBECTOMY;  Surgeon: Waynetta Sandy, MD;  Location: [place];  Service: Vascular;  Laterality: Left;   VENOGRAM Left 09/17/2017   Procedure: ULTRASOUND POPLITEAL ACCESS; CENTRAL VENOGRAM, IVVS, LYSIS CATHETER PLACEMENT;  Surgeon: Waynetta Sandy, MD;  Location: [place];  Service: Vascular;  Laterality: Left;   Family History: History reviewed. No pertinent family history. Family Psychiatric  History: Unknown cannot assess Social History:  Social History   Substance and Sexual Activity  Alcohol Use No     Social History   Substance and Sexual Activity  Drug Use Never    Social History   Socioeconomic  History   Marital status: Single    Spouse name: Not on file   Number of children: Not on file   Years of education: Not on file   Highest education level: Not on file  Occupational History   Not on file  Tobacco Use   Smoking status: Every Day    Packs/day: 0.50    Types: Cigarettes    Smokeless tobacco: Never  Vaping Use   Vaping Use: Never used  Substance and Sexual Activity   Alcohol use: No   Drug use: Never   Sexual activity: Not on file  Other Topics Concern   Not on file  Social History Narrative   Not on file   Social Determinants of Health   Financial Resource Strain: Not on file  Food Insecurity: Not on file  Transportation Needs: Not on file  Physical Activity: Not on file  Stress: Not on file  Social Connections: Not on file   Additional Social History:                         Sleep: Good  Appetite:  Fair  Current Medications: Current Facility-Administered Medications  Medication Dose Route Frequency Provider Last Rate Last Admin   acetaminophen (TYLENOL) tablet 650 mg  650 mg Oral Q6H PRN Starkes-Perry, Gayland Curry, FNP       alum & mag hydroxide-simeth (MAALOX/MYLANTA) 200-200-20 MG/5ML suspension 30 mL  30 mL Oral Q4H PRN Starkes-Perry, Gayland Curry, FNP       divalproex (DEPAKOTE ER) 24 hr tablet 500 mg  500 mg Oral BID Suella Broad, FNP   500 mg at 06/30/21 0959   gabapentin (NEURONTIN) capsule 400 mg  400 mg Oral TID Suella Broad, FNP   400 mg at 06/30/21 [id number]   ziprasidone (GEODON) injection 20 mg  20 mg Intramuscular Q12H PRN Suella Broad, FNP   20 mg at 06/29/21 1615   And   LORazepam (ATIVAN) tablet 1 mg  1 mg Oral PRN Starkes-Perry, Gayland Curry, FNP       magnesium hydroxide (MILK OF MAGNESIA) suspension 30 mL  30 mL Oral Daily PRN Starkes-Perry, Gayland Curry, FNP       OLANZapine zydis (ZYPREXA) disintegrating tablet 5 mg  5 mg Oral BID Briant Cedar, MD   5 mg at 06/30/21 2149   propranolol (INDERAL) tablet 10 mg  10 mg Oral Daily Suella Broad, FNP   10 mg at 06/30/21 0958   traZODone (DESYREL) tablet 50 mg  50 mg Oral QHS PRN,MR X 1 Lindon Romp A, NP   50 mg at 07/01/21 0002    Lab Results: No results found for this or any previous visit (from the past 48 hour(s)).  Blood Alcohol level:   Lab Results  Component Value Date   ETH <10 06/28/2021   ETH <10 [id number]    Metabolic Disorder Labs: Lab Results  Component Value Date   HGBA1C 6.1 (H) 06/28/2021   MPG 128 06/28/2021   No results found for: PROLACTIN Lab Results  Component Value Date   CHOL 171 02/11/2021   TRIG 154 (H) 02/11/2021   HDL 43 02/11/2021   CHOLHDL 4.0 02/11/2021   LDLCALC 102 (H) 02/11/2021    Physical Findings: AIMS:  , ,  ,  ,    CIWA:    COWS:     Musculoskeletal: Strength & Muscle Tone: within normal limits Gait & Station: normal Patient leans: N/A  Psychiatric Specialty Exam:  Presentation  General Appearance: -- (in scrubs, strong odor throughout the room)  Eye Contact:Good  Speech:Garbled; Slurred  Speech Volume:Normal  Handedness:-- (cannot assess)   Mood and Affect  Mood:-- (cannot assess)  Affect:Flat   Thought Process  Thought Processes:-- (cannot fully assess seems disorganized)  Descriptions of Associations:-- (cannot assess)  Orientation:-- (cannot assess)  Thought Content:-- (cannot assess)  History of Schizophrenia/Schizoaffective disorder:Yes  Duration of Psychotic Symptoms:No data recorded Hallucinations:Hallucinations: Visual Description of Visual Hallucinations: when asked to describe states "I can't"  Ideas of Reference:-- (cannot assess)  Suicidal Thoughts:Suicidal Thoughts: No  Homicidal Thoughts:Homicidal Thoughts: No   Sensorium  Memory:-- (cannot assess)  Judgment:-- (cannot assess)  Insight:-- (cannot assess)   Executive Functions  Concentration:-- (cannot fully assess but tends to follow conversation decently)  Attention Span:-- (cannot fully assess but tends to follow conversation decently)  Recall:-- (cannot assess)  Fund of Knowledge:-- (cannot assess)  Language:Poor   Psychomotor Activity  Psychomotor Activity:Psychomotor Activity: Decreased   Assets  Assets:-- (cannot assess)   Sleep  Sleep:Sleep:  Good Number of Hours of Sleep: 0 (not documented)    Physical Exam: Physical Exam Vitals and nursing note reviewed.  Constitutional:      General: He is not in acute distress.    Appearance: Normal appearance. He is normal weight. He is not ill-appearing or toxic-appearing.  HENT:     Head: Normocephalic and atraumatic.  Pulmonary:     Effort: Pulmonary effort is normal.  Musculoskeletal:        General: Normal range of motion.  Neurological:     Mental Status: He is alert.   Review of Systems  Respiratory:  Negative for cough and shortness of breath.   Cardiovascular:  Negative for chest pain.  Gastrointestinal:  Negative for abdominal pain, constipation, diarrhea, nausea and vomiting.  Neurological:  Negative for dizziness, weakness and headaches.  Psychiatric/Behavioral:  Positive for hallucinations (when asked about VH reports cant say what he sees). Negative for depression and suicidal ideas. The patient is not nervous/anxious.   Blood pressure (!) 156/82, pulse 96, resp. rate 18. There is no height or weight on file to calculate BMI.   Treatment Plan Summary: Daily contact with patient to assess and evaluate symptoms and progress in treatment and Medication management  Fayne Ahn is a 61 yr old male who presented to [facility] on 2/20 voluntarily by law enforcement due to complaints he was aggressive towards his family, he was admitted to [facility] on 2/22.  PPHx is significant for Schizophrenia.     Jahkari is still unable to provide answers to questions in an understandable/meaningful way.  He has been refusing his morning medications despite multiple offerings but unable to determine why.  If he takes his evening dose then we will consider consolidating to evening dosing where possible.  Social Work will attempt to contact his father to determine where discharge location will be.  At this time as this is unknown we will not restart his Clozaril and continue with Zyprexa.  We will  not make any medication changes at this time.  We will continue to monitor.     Schizophrenia: -Continue Zyprexa 5 mg BID -Continue Gabapentin 400 mg TID -Continue Depakote ER 500 mg BID -Continue Agitation Protocol: Geodon/Ativan     -Continue Propanolol 10 mg daily -Continue PRN's: Tylenol, Maalox, Milk of Magnesia   Briant Cedar, MD 07/01/2021, 3:30 PM

## 2021-07-02 MED ORDER — HALOPERIDOL 5 MG PO TABS
5.0000 mg | ORAL_TABLET | Freq: Two times a day (BID) | ORAL | Status: DC
  Administered 2021-07-02 – 2021-07-07 (×9): 5 mg via ORAL
  Filled 2021-07-02 (×15): qty 1

## 2021-07-02 MED ORDER — HALOPERIDOL 5 MG PO TABS
5.0000 mg | ORAL_TABLET | Freq: Two times a day (BID) | ORAL | Status: DC
  Filled 2021-07-02 (×5): qty 1

## 2021-07-02 MED ORDER — HALOPERIDOL LACTATE 5 MG/ML IJ SOLN
5.0000 mg | Freq: Two times a day (BID) | INTRAMUSCULAR | Status: DC
Start: 2021-07-02 — End: 2021-07-07
  Administered 2021-07-06: 5 mg via INTRAMUSCULAR
  Filled 2021-07-02 (×14): qty 1

## 2021-07-02 NOTE — [facility] Suicide Risk Assessment (Signed)
Mark [name] INPATIENT:  Family/Significant Other Suicide Prevention Education  Suicide Prevention Education:  Contact Attempts: father, Muse Brincefield [phone]), has been identified by the patient as the family member/significant other with whom the patient will be residing, and identified as the person(s) who will aid the patient in the event of a mental health crisis.  With written consent from the patient, two attempts were made to provide suicide prevention education, prior to and/or following the patient's discharge.  We were unsuccessful in providing suicide prevention education.  A suicide education pamphlet was given to the patient to share with family/significant other.  Date and time of first attempt:07/01/21 /10:29am CSW left HIPAA  compliant voicemail requesting a return call.  Date and time of second attempt: 07/02/21 / 10:44am CSW left HIPAA compliant voicemail requesting a return call.    Mark [name] A Mark [name] 07/02/2021, 10:45 AM

## 2021-07-02 NOTE — Progress Notes (Signed)
Pt remains isolative to room this shift. OOB for meals and back in bed post meals. Denies SI, HI, AVH and pain when assessed. Continues to refused all scheduled medications and labs despite multiple attempts "I just want to rest, just let me rest". Tolerates meals and fluids well. Safety checks maintained at Q 15 minutes intervals without self harm gestures or outburst. Support and encouragement provided to pt. He appears to be in no physical distress / discomfort thus far this shift.

## 2021-07-02 NOTE — Group Note (Signed)
LCSW Group Therapy Note  Group Date: 07/02/2021 Start Time: 1100 End Time: 1200   Type of Therapy and Topic:  Group Therapy - Healthy vs Unhealthy Coping Skills  Participation Level:  Did Not Attend   Description of Group The focus of this group was to determine what unhealthy coping techniques typically are used by group members and what healthy coping techniques would be helpful in coping with various problems. Patients were guided in becoming aware of the differences between healthy and unhealthy coping techniques. Patients were asked to identify 2-3 healthy coping skills they would like to learn to use more effectively.  Therapeutic Goals Patients learned that coping is what human beings do all day long to deal with various situations in their lives Patients defined and discussed healthy vs unhealthy coping techniques Patients identified their preferred coping techniques and identified whether these were healthy or unhealthy Patients determined 2-3 healthy coping skills they would like to become more familiar with and use more often. Patients provided support and ideas to each other   Summary of Patient Progress:  Did not attend   Therapeutic Modalities Cognitive Behavioral Therapy Motivational Interviewing  Otelia Santee, LCSW 07/02/2021  1:15 PM

## 2021-07-02 NOTE — Progress Notes (Signed)
[facility] MD Progress Note  07/02/2021 7:22 PM Mark [name]  MRN:  [id number] Subjective:   Mark [name] is a 61 yr old male who presented to [facility] on 2/20 voluntarily by law enforcement due to complaints he was aggressive towards his family, [facility].  PPHx is significant for Schizophrenia.    Case was discussed in the multidisciplinary team. MAR was reviewed and patient was not compliant with medications taking only his evening Zyprexa and Propanolol this morning.    Psychiatric Team made the following recommendations yesterday: -Continue Zyprexa 5 mg BID -Continue Gabapentin 400 mg TID -Continue Depakote ER 500 mg BID -Continue Agitation Protocol: Geodon/Ativan    Attempted to interview the patient today and patient continues to mumble/slur, however, he does appear to have showered as the room does not have a strong odor today.  He does report no SI, HI, or AVH,  He reports no [place], Ideas of Reference, or other First [place] symptoms.  Attempted to find out why he is refusing medications and he says something to the effect of he  is trying to sleep but then says that the medications will make him to tired.  He then asks for pudding or ice cream and if there is a snack bar on the unit.  He reports no other concerns at present.    Principal Problem: Schizophrenia ([place]) Diagnosis: Principal Problem:   Schizophrenia ([place])  Total Time spent with patient:  I personally spent 20 minutes on the unit in direct patient care. The direct patient care time included face-to-face time with the patient, reviewing the patient's chart, communicating with other professionals, and coordinating care. Greater than 50% of this time was spent in counseling or coordinating care with the patient regarding goals of hospitalization, psycho-education, and discharge planning needs.   Past Psychiatric History: Schizophrenia  Past Medical History:  Past Medical History:  Diagnosis  Date   COPD (chronic obstructive pulmonary disease) ([place])    Hypertension    Schizophrenia ([place])     Past Surgical History:  Procedure Laterality Date   LOWER EXTREMITY ANGIOGRAPHY N/A 09/18/2017   Procedure: LOWER EXTREMITY ANGIOGRAPHY- Recheck lysis;  Surgeon: Waynetta Sandy, MD;  Location: [place] [facility];  Service: Cardiovascular;  Laterality: N/A;   PERIPHERAL VASCULAR INTERVENTION Left 09/18/2017   Procedure: PERIPHERAL VASCULAR INTERVENTION;  Surgeon: Waynetta Sandy, MD;  Location: [place] [facility];  Service: Cardiovascular;  Laterality: Left;   THROMBECTOMY FEMORAL ARTERY Left 09/17/2017   Procedure: POSSIBLE THROMBECTOMY;  Surgeon: Waynetta Sandy, MD;  Location: [place];  Service: Vascular;  Laterality: Left;   VENOGRAM Left 09/17/2017   Procedure: ULTRASOUND POPLITEAL ACCESS; CENTRAL VENOGRAM, IVVS, LYSIS CATHETER PLACEMENT;  Surgeon: Waynetta Sandy, MD;  Location: [place];  Service: Vascular;  Laterality: Left;   Family History: History reviewed. No pertinent family history. Family Psychiatric  History: Unknown cannot assess Social History:  Social History   Substance and Sexual Activity  Alcohol Use No     Social History   Substance and Sexual Activity  Drug Use Never    Social History   Socioeconomic History   Marital status: Single    Spouse name: Not on file   Number of children: Not on file   Years of education: Not on file   Highest education level: Not on file  Occupational History   Not on file  Tobacco Use   Smoking status: Every Day    Packs/day: 0.50  Types: Cigarettes   Smokeless tobacco: Never  Vaping Use   Vaping Use: Never used  Substance and Sexual Activity   Alcohol use: No   Drug use: Never   Sexual activity: Not on file  Other Topics Concern   Not on file  Social History Narrative   Not on file   Social Determinants of Health   Financial Resource Strain: Not on file  Food Insecurity: Not  on file  Transportation Needs: Not on file  Physical Activity: Not on file  Stress: Not on file  Social Connections: Not on file   Additional Social History:                         Sleep: Good  Appetite:  Fair  Current Medications: Current Facility-Administered Medications  Medication Dose Route Frequency Provider Last Rate Last Admin   acetaminophen (TYLENOL) tablet 650 mg  650 mg Oral Q6H PRN Starkes-Perry, Gayland Curry, FNP       alum & mag hydroxide-simeth (MAALOX/MYLANTA) 200-200-20 MG/5ML suspension 30 mL  30 mL Oral Q4H PRN Starkes-Perry, Gayland Curry, FNP       divalproex (DEPAKOTE ER) 24 hr tablet 500 mg  500 mg Oral BID Suella Broad, FNP   500 mg at 06/30/21 0959   gabapentin (NEURONTIN) capsule 400 mg  400 mg Oral TID Suella Broad, FNP   400 mg at 06/30/21 [id number]   haloperidol (HALDOL) tablet 5 mg  5 mg Oral BID Briant Cedar, MD       Or   haloperidol lactate (HALDOL) injection 5 mg  5 mg Intramuscular BID Briant Cedar, MD       ziprasidone (GEODON) injection 20 mg  20 mg Intramuscular Q12H PRN Suella Broad, FNP   20 mg at 06/29/21 1615   And   LORazepam (ATIVAN) tablet 1 mg  1 mg Oral PRN Suella Broad, FNP       magnesium hydroxide (MILK OF MAGNESIA) suspension 30 mL  30 mL Oral Daily PRN Starkes-Perry, Gayland Curry, FNP       propranolol (INDERAL) tablet 10 mg  10 mg Oral Daily Suella Broad, FNP   10 mg at 06/30/21 0958   traZODone (DESYREL) tablet 50 mg  50 mg Oral QHS PRN,MR X 1 Lindon Romp A, NP   50 mg at 07/01/21 0002    Lab Results: No results found for this or any previous visit (from the past 48 hour(s)).  Blood Alcohol level:  Lab Results  Component Value Date   ETH <10 06/28/2021   ETH <10 [id number]    Metabolic Disorder Labs: Lab Results  Component Value Date   HGBA1C 6.1 (H) 06/28/2021   MPG 128 06/28/2021   No results found for: PROLACTIN Lab Results  Component Value Date   CHOL  171 02/11/2021   TRIG 154 (H) 02/11/2021   HDL 43 02/11/2021   CHOLHDL 4.0 02/11/2021   LDLCALC 102 (H) 02/11/2021    Physical Findings: AIMS:  , ,  ,  ,    CIWA:    COWS:     Musculoskeletal: Strength & Muscle Tone: within normal limits Gait & Station: normal Patient leans: N/A  Psychiatric Specialty Exam:  Presentation  General Appearance: Casual  Eye Contact:Good  Speech:Garbled; Slurred  Speech Volume:Normal  Handedness:-- (cannot assess)   Mood and Affect  Mood:-- ("fine")  Affect:Flat; Constricted   Thought Process  Thought Processes:Disorganized  Descriptions of Associations:-- (  cannot assess)  Orientation:None  Thought Content:Perseveration (on food)  History of Schizophrenia/Schizoaffective disorder:Yes  Duration of Psychotic Symptoms:No data recorded Hallucinations:Hallucinations: None Description of Visual Hallucinations: when asked to describe states "I can't"  Ideas of Reference:None  Suicidal Thoughts:Suicidal Thoughts: No  Homicidal Thoughts:Homicidal Thoughts: No   Sensorium  Memory:Immediate Poor; Recent Poor  Judgment:Impaired  Insight:Lacking   Executive Functions  Concentration:Fair  Attention Span:Fair  Recall:-- (cannot assess)  Fund of Knowledge:-- (cannot assess)  Language:Poor   Psychomotor Activity  Psychomotor Activity:Psychomotor Activity: Decreased   Assets  Assets:-- (cannot assess)   Sleep  Sleep:Sleep: Good Number of Hours of Sleep: 0 (not documented)    Physical Exam: Physical Exam Vitals and nursing note reviewed.  Constitutional:      General: He is not in acute distress.    Appearance: Normal appearance. He is normal weight. He is not ill-appearing or toxic-appearing.  HENT:     Head: Normocephalic and atraumatic.  Pulmonary:     Effort: Pulmonary effort is normal.  Musculoskeletal:        General: Normal range of motion.  Neurological:     General: No focal deficit present.      Mental Status: He is alert.   Review of Systems  Respiratory:  Negative for cough and shortness of breath.   Cardiovascular:  Negative for chest pain.  Gastrointestinal:  Negative for abdominal pain, constipation, diarrhea, nausea and vomiting.  Neurological:  Negative for dizziness, weakness and headaches.  Psychiatric/Behavioral:  Negative for depression, hallucinations and suicidal ideas. The patient is not nervous/anxious.   Blood pressure 132/89, pulse (!) 108, temperature (!) 97.3 F (36.3 C), temperature source Oral, resp. rate 18, SpO2 100 %. There is no height or weight on file to calculate BMI.   Treatment Plan Summary: Daily contact with patient to assess and evaluate symptoms and progress in treatment and Medication management  Mark [name] is a 61 yr old male who presented to [facility] on 2/20 voluntarily by law enforcement due to complaints he was aggressive towards his family, he was admitted to [facility] on 2/22.  PPHx is significant for Schizophrenia.     Jerrad continues to refuse his medications.  Due to this we will have Dr. Leverne Humbles placed a Second Opinion for Forced Medications Over Objection.  Also we will stop Zyprexa and start Haldol given his age and concern for Dementia.  We will continue to monitor.     Schizophrenia: -Stop Zyprexa  -Second Opinion for Forced Medications Over Objection placed 2/24 by Dr. Barrington Ellison  -Start Haldol 5 mg PO BID or 5 mg IM back if refuses -Continue Gabapentin 400 mg TID -Continue Depakote ER 500 mg BID -Continue Agitation Protocol: Geodon/Ativan     -Continue Propanolol 10 mg daily -Continue PRN's: Tylenol, Maalox, Milk of Magnesia   Briant Cedar, MD 07/02/2021, 7:22 PM

## 2021-07-02 NOTE — Progress Notes (Signed)
[facility] Second Physician Opinion Progress Note for Medication Administration to Non-consenting Patients (For Involuntarily Committed Patients)  Patient: ZAN ORLICK Date of Birth: [id number] MRN: [id number]  Reason for the Medication: The patient, without the benefit of the specific treatment measure, is incapable of participating in any available treatment plan that will give the patient a realistic opportunity of improving the patient's condition.  Consideration of Side Effects: Consideration of the side effects related to the medication plan has been given.  Rationale for Medication Administration: Patient has been hospitalized for 2 days, refusing all medications, he has a history of schizophrenia and is acutely psychotic.  He was aggressive towards family before coming to the hospital.  On assessment today, patient states that he "needs to rest."  Reviewed with patient that taking appropriate antipsychotic medication would be beneficial.  He is oriented only to place (psychiatric hospital) he is not oriented to city, state, time, or date.  Speech is mumbled, thought content is disorganized.  Reviewed purposed a forced medication order, and he stated that taking shots would be a good idea for 1 or 2 days.  Recommend continue to assess patient as he clears and transition to p.o. medications when able.    Mariel Craft, MD 07/02/21  6:02 PM   This documentation is good for (7) seven days from the date of the MD signature. New documentation must be completed every seven (7) days with detailed justification in the medical record if the patient requires continued non-emergent administration of psychotropic medications.

## 2021-07-02 NOTE — Progress Notes (Signed)
Adult Psychoeducational Group Note  Date:  07/02/2021 Time:  8:40 PM  Group Topic/Focus:  Wrap-Up Group:   The focus of this group is to help patients review their daily goal of treatment and discuss progress on daily workbooks.  Participation Level:  Did Not Attend  Participation Quality:   Did Not Attend  Affect:   Did Not Attend  Cognitive:   Did Not Attend  Insight: None  Engagement in Group:   Did Not Attend  Modes of Intervention:   Did Not Attend  Additional Comments:   Pt was encouraged to attend wrap up group but did not attend.  Felipa Furnace 07/02/2021, 8:40 PM

## 2021-07-02 NOTE — Group Note (Signed)
Recreation Therapy Group Note   Group Topic:Coping Skills  Group Date: 07/02/2021 Start Time: 1005 End Time: 1045 Facilitators: Caroll Rancher, LRT,CTRS Location: [address] Dayroom   Goal Area(s) Addresses:  Patient will identify positive coping techniques. Patient will identify benefits of using coping skills post d/c.  Group Description:  Mind Map.  Patient was provided a blank template of a diagram with 32 blank boxes in a tiered system, branching from the center (similar to a bubble chart). LRT directed patients to label the middle of the diagram "Coping Skills" and consider 8 different sources in which they would need coping skills. LRT and patients came up with the first 8 instances (depression, anxiety, hopelessness, fear, crying, selfishness, pride and anger) together.  Patients were to then come up with 3 effective coping skills to address each identified area in the remaining boxes. Patients were given time to come up with coping skills for each instance.  The group would then comeback together and LRT would write the coping skills for each instance on the board.  Patients would then be able to fill in any blank boxes they may have.   Affect/Mood: N/A   Participation Level: Did not attend    Clinical Observations/Individualized Feedback:     Plan: Continue to engage patient in RT group sessions 2-3x/week.   Caroll Rancher, LRT,CTRS  07/02/2021 11:10 AM

## 2021-07-02 NOTE — Progress Notes (Signed)
D- Patient alert. Patient irritable and isolative in room.  Denies SI, HI, AVH. Pt continues refusing meds but when educated on "force meds order" patient reluctantly agreed to take evening medication.    A- Scheduled medications administered to patient, per MD orders.Routine safety checks conducted every 15 minutes.  Patient informed to notify staff with problems or concerns.   R- Patient compliant with medications.   07/02/21 2035  Psych Admission Type (Psych Patients Only)  Admission Status Involuntary  Psychosocial Assessment  Patient Complaints None  Eye Contact Brief  Facial Expression Flat  Affect Flat  Speech Rapid  Interaction Isolative;Poor  Motor Activity Slow  Appearance/Hygiene Poor hygiene;Disheveled  Behavior Characteristics Unwilling to participate  Mood Preoccupied;Irritable  Thought Process  Coherency Disorganized  Content Preoccupation  Delusions None reported or observed  Perception WDL  Hallucination None reported or observed  Judgment Poor  Confusion Mild  Danger to Self  Current suicidal ideation? Denies  Danger to Others  Danger to Others None reported or observed  Danger to Others Abnormal  Harmful Behavior to others No threats or harm toward other people

## 2021-07-03 LAB — CULTURE, BLOOD (ROUTINE X 2)
Culture: NO GROWTH
Special Requests: ADEQUATE

## 2021-07-03 NOTE — Progress Notes (Signed)
Dar Note: Patient still presents with irritable mood and affect.  Refused all prescribed medications except Haldol due to forced medication order.  Patient is withdrawn and isolative to his room.  Routine safety checks maintained.  Refused to attend group when invited.  Patient is safe on the unit.

## 2021-07-03 NOTE — Progress Notes (Signed)
°   07/03/21 2300  Psych Admission Type (Psych Patients Only)  Admission Status Involuntary  Psychosocial Assessment  Patient Complaints Anxiety  Eye Contact Brief  Facial Expression Flat  Affect Irritable  Speech Rapid  Interaction Isolative  Motor Activity Slow  Appearance/Hygiene Disheveled  Behavior Characteristics Unwilling to participate  Mood Irritable  Thought Process  Coherency Disorganized  Content Preoccupation  Delusions None reported or observed  Perception WDL  Hallucination None reported or observed  Judgment Poor  Confusion None  Danger to Self  Current suicidal ideation? Denies  Danger to Others  Danger to Others None reported or observed  Danger to Others Abnormal  Harmful Behavior to others No threats or harm toward other people   Patient remains safe on unit. Denies SI, HI and AVH. Patient received ativan 1 mg and trazodone for insomnia and was effective. Patient in bed appears asleep no apparent distress noted

## 2021-07-03 NOTE — Progress Notes (Signed)
Adult Psychoeducational Group Note  Date:  07/03/2021 Time:  11:14 PM  Group Topic/Focus:  Wrap-Up Group:   The focus of this group is to help patients review their daily goal of treatment and discuss progress on daily workbooks.  Participation Level:  Did Not Attend  Participation Quality:   Did Not Attend  Affect:   Did Not Attend  Cognitive:   Did Not Attend  Insight: None  Engagement in Group:   Did Not Attend  Modes of Intervention:   Did Not Attend  Additional Comments:  Pt was encouraged to attend wrap up group but did not attend.  Felipa Furnace 07/03/2021, 11:14 PM

## 2021-07-03 NOTE — Progress Notes (Signed)
Pt was encouraged but didn't attend orientation/goals group. ?

## 2021-07-03 NOTE — Group Note (Signed)
LCSW Group Therapy ° ° °Due to high patient acuity and staffing, group was unable to be held on 07/03/2021. The CSW supervisor as well as the on-duty AC was made aware. ° °Mark [name] T Modesto Ganoe LCSWA  °2:41 PM ° °

## 2021-07-03 NOTE — [facility] Group Notes (Signed)
Goals Group °07/03/2021 ° ° °Group Focus: affirmation, clarity of thought, and goals/reality orientation °Treatment Modality:  Psychoeducation °Interventions utilized were assignment, group exercise, and support °Purpose: To be able to understand and verbalize the reason for their admission to the hospital. To understand that the medication helps with their chemical imbalance but they also need to work on their choices in life. To be challenged to develop a list of 30 positives about themselves. Also introduce the concept that "feelings" are not reality. ° °Participation Level:  did not attend °Mark [name] A °

## 2021-07-03 NOTE — Progress Notes (Signed)
[facility] MD Progress Note  07/03/2021 2:50 PM Mark [name]  MRN:  [id number] Subjective:   Mark [name] is a 61 yr old male who presented to [facility] on 2/20 voluntarily by law enforcement due to complaints he was aggressive towards his family, he was admitted to [facility] on 2/22.  PPHx is significant for Schizophrenia.    Case was discussed in the multidisciplinary team. MAR was reviewed and patient was not compliant with medications taking hi meds and only took Haldol tablet.  He did not require any as needed medications yesterday.   Psychiatric Team made the following recommendations yesterday: -Stop Zyprexa  -Second Opinion for Forced Medications Over Objection placed 2/24 by Dr. Rebecca Eaton  -Start Haldol 5 mg PO BID or 5 mg IM back if refuses -Continue Gabapentin 400 mg TID -Continue Depakote ER 500 mg BID -Continue Agitation Protocol: Geodon/Ativan -Continue Propanolol 10 mg daily  Attempted to interview the patient today and patient continues to mumble/slur.  He is minimally interactive. He has flat affect.  He is oriented to self only, not oriented to place, time. He reports that his stomach feels funny but denies any diarrhea or constipation.    He slept well last night and reports stable appetite.  He states his mood is " all right" and denies any depression or anxiety.  He talked to his father yesterday.  He does report no SI, HI, or AVH,  He reports no paranoia.  He reports thought broadcasting, and ideas of reference.  When asked why he is refusing medications, he states his stomach feels funny.  Encouraged to take medications regularly.  Patient verbalizes understanding. He reports no other concerns at present.  Per nursing staff patient is isolating in his room and came outside his room once to talk on phone.   He is not able to do DOWB.  Principal Problem: Schizophrenia (HCC) Diagnosis: Principal Problem:   Schizophrenia (HCC)  Total Time spent with patient:  I personally spent 20  minutes on the unit in direct patient care. The direct patient care time included face-to-face time with the patient, reviewing the patient's chart, communicating with other professionals, and coordinating care. Greater than 50% of this time was spent in counseling or coordinating care with the patient regarding goals of hospitalization, psycho-education, and discharge planning needs.   Past Psychiatric History: Schizophrenia  Past Medical History:  Past Medical History:  Diagnosis Date   COPD (chronic obstructive pulmonary disease) (HCC)    Hypertension    Schizophrenia (HCC)     Past Surgical History:  Procedure Laterality Date   LOWER EXTREMITY ANGIOGRAPHY N/A 09/18/2017   Procedure: LOWER EXTREMITY ANGIOGRAPHY- Recheck lysis;  Surgeon: Maeola Harman, MD;  Location: [facility] INVASIVE CV LAB;  Service: Cardiovascular;  Laterality: N/A;   PERIPHERAL VASCULAR INTERVENTION Left 09/18/2017   Procedure: PERIPHERAL VASCULAR INTERVENTION;  Surgeon: Maeola Harman, MD;  Location: [facility] INVASIVE CV LAB;  Service: Cardiovascular;  Laterality: Left;   THROMBECTOMY FEMORAL ARTERY Left 09/17/2017   Procedure: POSSIBLE THROMBECTOMY;  Surgeon: Maeola Harman, MD;  Location: [facility] OR;  Service: Vascular;  Laterality: Left;   VENOGRAM Left 09/17/2017   Procedure: ULTRASOUND POPLITEAL ACCESS; CENTRAL VENOGRAM, IVVS, LYSIS CATHETER PLACEMENT;  Surgeon: Maeola Harman, MD;  Location: [facility] OR;  Service: Vascular;  Laterality: Left;   Family History: History reviewed. No pertinent family history. Family Psychiatric  History: Unknown cannot assess Social History:  Social History   Substance and Sexual Activity  Alcohol Use No  Social History   Substance and Sexual Activity  Drug Use Never    Social History   Socioeconomic History   Marital status: Single    Spouse name: Not on file   Number of children: Not on file   Years of education: Not on file   Highest  education level: Not on file  Occupational History   Not on file  Tobacco Use   Smoking status: Every Day    Packs/day: 0.50    Types: Cigarettes   Smokeless tobacco: Never  Vaping Use   Vaping Use: Never used  Substance and Sexual Activity   Alcohol use: No   Drug use: Never   Sexual activity: Not on file  Other Topics Concern   Not on file  Social History Narrative   Not on file   Social Determinants of Health   Financial Resource Strain: Not on file  Food Insecurity: Not on file  Transportation Needs: Not on file  Physical Activity: Not on file  Stress: Not on file  Social Connections: Not on file   Additional Social History:                         Sleep: Good  Appetite:  Fair  Current Medications: Current Facility-Administered Medications  Medication Dose Route Frequency Provider Last Rate Last Admin   acetaminophen (TYLENOL) tablet 650 mg  650 mg Oral Q6H PRN Starkes-Perry, Juel Burrow, FNP       alum & mag hydroxide-simeth (MAALOX/MYLANTA) 200-200-20 MG/5ML suspension 30 mL  30 mL Oral Q4H PRN Starkes-Perry, Juel Burrow, FNP       divalproex (DEPAKOTE ER) 24 hr tablet 500 mg  500 mg Oral BID Maryagnes Amos, FNP   500 mg at 06/30/21 0959   gabapentin (NEURONTIN) capsule 400 mg  400 mg Oral TID Maryagnes Amos, FNP   400 mg at 06/30/21 4128   haloperidol (HALDOL) tablet 5 mg  5 mg Oral BID Lauro Franklin, MD   5 mg at 07/03/21 2081   Or   haloperidol lactate (HALDOL) injection 5 mg  5 mg Intramuscular BID Lauro Franklin, MD       ziprasidone (GEODON) injection 20 mg  20 mg Intramuscular Q12H PRN Maryagnes Amos, FNP   20 mg at 06/29/21 1615   And   LORazepam (ATIVAN) tablet 1 mg  1 mg Oral PRN Maryagnes Amos, FNP       magnesium hydroxide (MILK OF MAGNESIA) suspension 30 mL  30 mL Oral Daily PRN Starkes-Perry, Juel Burrow, FNP       propranolol (INDERAL) tablet 10 mg  10 mg Oral Daily Maryagnes Amos, FNP   10 mg  at 06/30/21 0958   traZODone (DESYREL) tablet 50 mg  50 mg Oral QHS PRN,MR X 1 Nira Conn A, NP   50 mg at 07/01/21 0002    Lab Results: No results found for this or any previous visit (from the past 48 hour(s)).  Blood Alcohol level:  Lab Results  Component Value Date   ETH <10 06/28/2021   ETH <10 03/12/2020    Metabolic Disorder Labs: Lab Results  Component Value Date   HGBA1C 6.1 (H) 06/28/2021   MPG 128 06/28/2021   No results found for: PROLACTIN Lab Results  Component Value Date   CHOL 171 02/11/2021   TRIG 154 (H) 02/11/2021   HDL 43 02/11/2021   CHOLHDL 4.0 02/11/2021   LDLCALC 102 (H)  02/11/2021    Physical Findings: AIMS:  , ,  ,  ,    CIWA:    COWS:     Musculoskeletal: Strength & Muscle Tone: within normal limits Gait & Station: normal Patient leans: N/A  Psychiatric Specialty Exam:  Presentation  General Appearance: Disheveled  Eye Contact:Minimal  Speech:Slurred  Speech Volume:Normal  Handedness:-- (cannot assess)   Mood and Affect  Mood:-- (fine)  Affect:Flat   Thought Process  Thought Processes:Disorganized  Descriptions of Associations:-- (limited interaction)  Orientation:None  Thought Content:Tangential  History of Schizophrenia/Schizoaffective disorder:Yes  Duration of Psychotic Symptoms:No data recorded Hallucinations:Hallucinations: None  Ideas of Reference:None  Suicidal Thoughts:Suicidal Thoughts: No  Homicidal Thoughts:Homicidal Thoughts: No   Sensorium  Memory:Immediate Poor; Recent Poor  Judgment:Impaired  Insight:Lacking   Executive Functions  Concentration:Fair  Attention Span:Fair  Recall:Poor  Fund of Knowledge:Poor  Language:Poor   Psychomotor Activity  Psychomotor Activity:Psychomotor Activity: Decreased (Not coming out of his room)   Assets  Assets:-- (Limited Interaction)   Sleep  Sleep:Sleep: Good    Physical Exam: Physical Exam Vitals and nursing note reviewed.   Constitutional:      General: He is not in acute distress.    Appearance: Normal appearance. He is normal weight. He is not ill-appearing or toxic-appearing.  HENT:     Head: Normocephalic and atraumatic.  Pulmonary:     Effort: Pulmonary effort is normal.  Musculoskeletal:        General: Normal range of motion.  Neurological:     General: No focal deficit present.     Mental Status: He is alert.   Review of Systems  Respiratory:  Negative for cough and shortness of breath.   Cardiovascular:  Negative for chest pain.  Gastrointestinal:  Negative for abdominal pain, constipation, diarrhea, nausea and vomiting.       Per patient his stomach feels funny.  Neurological:  Negative for dizziness, weakness and headaches.  Psychiatric/Behavioral:  Negative for depression, hallucinations and suicidal ideas. The patient is not nervous/anxious.   Blood pressure 132/89, pulse (!) 108, temperature (!) 97.3 F (36.3 C), temperature source Oral, resp. rate 18, SpO2 100 %. There is no height or weight on file to calculate BMI.   Treatment Plan Summary: Daily contact with patient to assess and evaluate symptoms and progress in treatment and Medication management  Mark [name] is a 61 yr old male who presented to [facility] on 2/20 voluntarily by law enforcement due to complaints he was aggressive towards his family, he was admitted to [facility] on 2/22.  PPHx is significant for Schizophrenia.   [place] continues to refuse his medications.  Patient is still psychotic and reports thought broadcasting, and ideas of reference. He  not oriented. second Opinion for Forced Medications Over Objection by Dr.Maurer done on 07/02/21.  Zyprexa was stopped and Haldol was started yesterday given his age and concern for Dementia.  We will continue to monitor.   Schizophrenia: -Zyprexa stopped. -Patient is on forced medications. Second Opinion for Forced Medications Over Objection done by Dr. Rebecca Eaton on 07/02/21. -Continue  Haldol 5 mg PO BID or 5 mg IM back if refuses -Continue Gabapentin 400 mg TID -Continue Depakote ER 500 mg BID -Continue Agitation Protocol: Geodon/Ativan -Continue Propanolol 10 mg daily   Continue PRN's: Tylenol, Maalox, Milk of Magnesia, trazodone   Karsten Ro, MD PGY 2 07/03/2021, 2:50 PM

## 2021-07-04 LAB — CULTURE, BLOOD (ROUTINE X 2)
Culture: NO GROWTH
Special Requests: ADEQUATE

## 2021-07-04 LAB — LIPID PANEL
Cholesterol: 164 mg/dL (ref 0–200)
HDL: 44 mg/dL (ref >40)
LDL Cholesterol: 92 mg/dL (ref 0–99)
Total CHOL/HDL Ratio: 3.7 RATIO
Triglycerides: 139 mg/dL (ref <150)
VLDL: 28 mg/dL (ref 0–40)

## 2021-07-04 LAB — TSH: TSH: 3.541 u[IU]/mL (ref 0.350–4.500)

## 2021-07-04 MED ORDER — PANTOPRAZOLE SODIUM 20 MG PO TBEC
20.0000 mg | DELAYED_RELEASE_TABLET | Freq: Every day | ORAL | Status: DC
  Administered 2021-07-04 – 2021-07-07 (×3): 20 mg via ORAL
  Filled 2021-07-04 (×7): qty 1

## 2021-07-04 NOTE — Progress Notes (Signed)
[facility] MD Progress Note  07/04/2021 2:17 PM Mark [name]  MRN:  [id number] Subjective:   Mark [name] is a 61 yr old male who presented to [facility] on 2/20 voluntarily by law enforcement due to complaints he was aggressive towards his family, he was admitted to [facility] on 2/22.  PPHx is significant for Schizophrenia.   Case was discussed in the multidisciplinary team. MAR was reviewed and patient was compliant with medications except propanolol.   He required as needed trazodone last night.  Psychiatric Team made the following recommendations yesterday: -Zyprexa stopped. -Patient is on forced medications. Second Opinion for Forced Medications Over Objection done by Dr. Rebecca Eaton on 07/02/21. -Continue Haldol 5 mg PO BID or 5 mg IM back if refuses -Continue Gabapentin 400 mg TID -Continue Depakote ER 500 mg BID -Continue Agitation Protocol: Geodon/Ativan -Continue Propanolol 10 mg daily  Patient seen in his room today and patient continues to mumble/slur. He is lying in his bed.  He is minimally interactive. He has flat affect but.  laughs inappropriately sometimes.  He is seen responding to internal stimuli and talking to himself.  He is oriented x 3.  He knows current and previous presidents.  He reports some acidity and heartburn but denies any diarrhea or constipation.  Discussed to add Protonix.  Patient verbalizes understanding.  He slept well last night and reports stable appetite.  He states his mood is " good" and denies any depression or anxiety.  He states he went outside in the TV room and also took all his medications.  Again encouraged to be compliant with medications and attend groups.  He report no SI, HI, or AVH,  He reports no paranoia.  He denies thought thought insertion/addition, thought broadcasting, and ideas of reference.    Patient verbalizes understanding. He reports no other concerns at present.  Per nursing staff, patient took his medications, has been less isolative, and came out  of the room to watch TV. He is not able to do DOWB.  Principal Problem: Schizophrenia (HCC) Diagnosis: Principal Problem:   Schizophrenia (HCC)  Total Time spent with patient:  I personally spent 20 minutes on the unit in direct patient care. The direct patient care time included face-to-face time with the patient, reviewing the patient's chart, communicating with other professionals, and coordinating care. Greater than 50% of this time was spent in counseling or coordinating care with the patient regarding goals of hospitalization, psycho-education, and discharge planning needs.   Past Psychiatric History: Schizophrenia  Past Medical History:  Past Medical History:  Diagnosis Date   COPD (chronic obstructive pulmonary disease) (HCC)    Hypertension    Schizophrenia (HCC)     Past Surgical History:  Procedure Laterality Date   LOWER EXTREMITY ANGIOGRAPHY N/A 09/18/2017   Procedure: LOWER EXTREMITY ANGIOGRAPHY- Recheck lysis;  Surgeon: Maeola Harman, MD;  Location: [facility] INVASIVE CV LAB;  Service: Cardiovascular;  Laterality: N/A;   PERIPHERAL VASCULAR INTERVENTION Left 09/18/2017   Procedure: PERIPHERAL VASCULAR INTERVENTION;  Surgeon: Maeola Harman, MD;  Location: [facility] INVASIVE CV LAB;  Service: Cardiovascular;  Laterality: Left;   THROMBECTOMY FEMORAL ARTERY Left 09/17/2017   Procedure: POSSIBLE THROMBECTOMY;  Surgeon: Maeola Harman, MD;  Location: [facility] OR;  Service: Vascular;  Laterality: Left;   VENOGRAM Left 09/17/2017   Procedure: ULTRASOUND POPLITEAL ACCESS; CENTRAL VENOGRAM, IVVS, LYSIS CATHETER PLACEMENT;  Surgeon: Maeola Harman, MD;  Location: [facility] OR;  Service: Vascular;  Laterality: Left;   Family History: History reviewed. No  pertinent family history. Family Psychiatric  History: Unknown cannot assess Social History:  Social History   Substance and Sexual Activity  Alcohol Use No     Social History   Substance and Sexual  Activity  Drug Use Never    Social History   Socioeconomic History   Marital status: Single    Spouse name: Not on file   Number of children: Not on file   Years of education: Not on file   Highest education level: Not on file  Occupational History   Not on file  Tobacco Use   Smoking status: Every Day    Packs/day: 0.50    Types: Cigarettes   Smokeless tobacco: Never  Vaping Use   Vaping Use: Never used  Substance and Sexual Activity   Alcohol use: No   Drug use: Never   Sexual activity: Not on file  Other Topics Concern   Not on file  Social History Narrative   Not on file   Social Determinants of Health   Financial Resource Strain: Not on file  Food Insecurity: Not on file  Transportation Needs: Not on file  Physical Activity: Not on file  Stress: Not on file  Social Connections: Not on file   Additional Social History:                         Sleep: Good  Appetite:  Fair  Current Medications: Current Facility-Administered Medications  Medication Dose Route Frequency Provider Last Rate Last Admin   acetaminophen (TYLENOL) tablet 650 mg  650 mg Oral Q6H PRN Starkes-Perry, Juel Burrowakia S, FNP       alum & mag hydroxide-simeth (MAALOX/MYLANTA) 200-200-20 MG/5ML suspension 30 mL  30 mL Oral Q4H PRN Starkes-Perry, Juel Burrowakia S, FNP       divalproex (DEPAKOTE ER) 24 hr tablet 500 mg  500 mg Oral BID Maryagnes AmosStarkes-Perry, Takia S, FNP   500 mg at 07/04/21 [id number]   gabapentin (NEURONTIN) capsule 400 mg  400 mg Oral TID Maryagnes AmosStarkes-Perry, Takia S, FNP   400 mg at 07/04/21 1229   haloperidol (HALDOL) tablet 5 mg  5 mg Oral BID Lauro FranklinPashayan, Alexander S, MD   5 mg at 07/04/21 [id number]   Or   haloperidol lactate (HALDOL) injection 5 mg  5 mg Intramuscular BID Lauro FranklinPashayan, Alexander S, MD       magnesium hydroxide (MILK OF MAGNESIA) suspension 30 mL  30 mL Oral Daily PRN Starkes-Perry, Juel Burrowakia S, FNP       propranolol (INDERAL) tablet 10 mg  10 mg Oral Daily Maryagnes AmosStarkes-Perry, Takia S, FNP   10 mg at  07/04/21 0806   traZODone (DESYREL) tablet 50 mg  50 mg Oral QHS PRN,MR X 1 Nira ConnBerry, Jason A, NP   50 mg at 07/03/21 2102   ziprasidone (GEODON) injection 20 mg  20 mg Intramuscular Q12H PRN Maryagnes AmosStarkes-Perry, Takia S, FNP   20 mg at 06/29/21 1615    Lab Results:  Results for orders placed or performed during the hospital encounter of 06/29/21 (from the past 48 hour(s))  Lipid panel     Status: None   Collection Time: 07/04/21  6:34 AM  Result Value Ref Range   Cholesterol 164 0 - 200 mg/dL   Triglycerides 540139 <981<150 mg/dL   HDL 44 >19>40 mg/dL   Total CHOL/HDL Ratio 3.7 RATIO   VLDL 28 0 - 40 mg/dL   LDL Cholesterol 92 0 - 99 mg/dL    Comment:  Total Cholesterol/HDL:CHD Risk Coronary Heart Disease Risk Table                     Men   Women  1/2 Average Risk   3.4   3.3  Average Risk       5.0   4.4  2 X Average Risk   9.6   7.1  3 X Average Risk  23.4   11.0        Use the calculated Patient Ratio above and the CHD Risk Table to determine the patient's CHD Risk.        ATP III CLASSIFICATION (LDL):  <100     mg/dL   Optimal  644-034  mg/dL   Near or Above                    Optimal  130-159  mg/dL   Borderline  742-595  mg/dL   High  >638     mg/dL   Very High Performed at [facility], [address] [place], [place] [address]   TSH     Status: None   Collection Time: 07/04/21  6:34 AM  Result Value Ref Range   TSH 3.541 0.350 - 4.500 uIU/mL    Comment: Performed by a 3rd Generation assay with a functional sensitivity of <=0.01 uIU/mL. Performed at [facility], [address] [place], [place] [address]     Blood Alcohol level:  Lab Results  Component Value Date   Operating Room Services <10 06/28/2021   ETH <10 03/12/2020    Metabolic Disorder Labs: Lab Results  Component Value Date   HGBA1C 6.1 (H) 06/28/2021   MPG 128 06/28/2021   No results found for: PROLACTIN Lab Results  Component Value Date   CHOL 164 07/04/2021   TRIG 139  07/04/2021   HDL 44 07/04/2021   CHOLHDL 3.7 07/04/2021   VLDL 28 07/04/2021   LDLCALC 92 07/04/2021   LDLCALC 102 (H) 02/11/2021    Physical Findings: AIMS:  , ,  ,  ,    CIWA:    COWS:     Musculoskeletal: Strength & Muscle Tone: within normal limits Gait & Station: normal Patient leans: N/A  Psychiatric Specialty Exam:  Presentation  General Appearance: Disheveled  Eye Contact:Minimal  Speech:Slurred  Speech Volume:Normal  Handedness:-- (cannot assess)   Mood and Affect  Mood:Euthymic (Good)  Affect:Inappropriate (Laughs inappropriately)   Thought Process  Thought Processes:Disorganized (Responding to internal stimuli.  Laughs inappropriately and talks to himself)  Descriptions of Associations:-- (Responding to internal stimuli)  Orientation:Full (Time, Place and Person)  Thought Content:-- (Not able to assess fully, limited interaction)  History of Schizophrenia/Schizoaffective disorder:Yes  Duration of Psychotic Symptoms:No data recorded Hallucinations:Hallucinations: None  Ideas of Reference:None  Suicidal Thoughts:Suicidal Thoughts: No  Homicidal Thoughts:Homicidal Thoughts: No   Sensorium  Memory:Immediate Fair; Recent Fair; Remote Fair (Knows current and past presidents)  Judgment:Impaired  Insight:Lacking   Executive Functions  Concentration:Fair  Attention Span:Fair  Recall:Poor  Fund of Knowledge:Poor  Language:Poor   Psychomotor Activity  Psychomotor Activity:Psychomotor Activity: Decreased   Assets  Assets:-- (Limited interaction)   Sleep  Sleep:Sleep: Good    Physical Exam: Physical Exam Vitals and nursing note reviewed.  Constitutional:      General: He is not in acute distress.    Appearance: Normal appearance. He is normal weight. He is not ill-appearing or toxic-appearing.  HENT:     Head: Normocephalic and atraumatic.  Pulmonary:     Effort: Pulmonary effort is normal.  Musculoskeletal:         General: Normal range of motion.  Neurological:     General: No focal deficit present.     Mental Status: He is alert.   Review of Systems  Respiratory:  Negative for cough and shortness of breath.   Cardiovascular:  Negative for chest pain.  Gastrointestinal:  Negative for abdominal pain, constipation, diarrhea, nausea and vomiting.       Per patient his stomach feels funny.  Neurological:  Negative for dizziness, weakness and headaches.  Psychiatric/Behavioral:  Negative for depression, hallucinations and suicidal ideas. The patient is not nervous/anxious.   Blood pressure 113/90, pulse 90, temperature 98 F (36.7 C), temperature source Oral, resp. rate 18, SpO2 100 %. There is no height or weight on file to calculate BMI.   Treatment Plan Summary: Daily contact with patient to assess and evaluate symptoms and progress in treatment and Medication management  Mark [name] is a 61 yr old male who presented to [facility] on 2/20 voluntarily by law enforcement due to complaints he was aggressive towards his family, he was admitted to [facility] on 2/22.  PPHx is significant for Schizophrenia.   Mark [name] started taking his medications. He has been less isolative, and came out of the room to watch TV.  Patient is still psychotic and laughs inappropriately and talks to himself.  He was seen responding to internal stimuli. He oriented x3. Second Opinion for Forced Medications Over Objection by Dr.Maurer done on 07/02/21.  Zyprexa was stopped and Haldol was started given his age and concern for Dementia.  We will continue to monitor.   Schizophrenia: -Zyprexa stopped. -Patient is on forced medications. Second Opinion for Forced Medications Over Objection done by Dr. Rebecca Eaton on 07/02/21. -Continue Haldol 5 mg PO BID or 5 mg IM back if refuses -Continue Gabapentin 400 mg TID -Continue Depakote ER 500 mg BID -Continue Agitation Protocol: Geodon/Ativan -Continue Propanolol 10 mg daily -Continue to encourage  patient to be compliant with his medications.   GERD -Start Protonix 20 mg daily.  Continue PRN's: Tylenol, Maalox, Milk of Magnesia, trazodone   Mark Ro, MD PGY 2 07/04/2021, 2:17 PM

## 2021-07-04 NOTE — Group Note (Signed)
LCSW Group Therapy Note ° ° °Group Date: 07/04/2021 °Start Time: 1000 °End Time: 1100 ° ° °Type of Therapy and Topic:  Group Therapy:  ° °LCSW Group Therapy °  °  °Due to high patient acuity and staffing, group was unable to be held on 07/04/2021. The CSW supervisor as well as the on-duty AC was made aware. ° ° ° °Mark Acrey A Sair Faulcon, LCSW °07/04/2021  12:30 PM   ° °

## 2021-07-04 NOTE — Progress Notes (Signed)
°   07/04/21 1200  °Psych Admission Type (Psych Patients Only)  °Admission Status Involuntary  °Psychosocial Assessment  °Patient Complaints None  °Eye Contact Brief  °Facial Expression Flat  °Affect Irritable  °Speech Rapid  °Interaction Isolative  °Motor Activity Slow  °Appearance/Hygiene Disheveled  °Behavior Characteristics Unwilling to participate  °Mood Irritable  °Thought Process  °Coherency Disorganized  °Content Preoccupation  °Delusions None reported or observed  °Perception WDL  °Hallucination None reported or observed  °Judgment Poor  °Confusion None  °Danger to Self  °Current suicidal ideation? Denies  °Danger to Others  °Danger to Others None reported or observed  °Danger to Others Abnormal  °Harmful Behavior to others No threats or harm toward other people  ° ° °

## 2021-07-04 NOTE — Progress Notes (Signed)
The focus of this group is to help patients review their daily goal of treatment and discuss progress on daily workbooks.  Pt attended the evening group but did not respond on-topic to discussion prompts from the [facility]. Pt appeared to be pre-occupied during group.

## 2021-07-04 NOTE — Progress Notes (Signed)
°   07/04/21 1200  Psych Admission Type (Psych Patients Only)  Admission Status Involuntary  Psychosocial Assessment  Patient Complaints None  Eye Contact Brief  Facial Expression Flat  Affect Irritable  Speech Rapid  Interaction Isolative  Motor Activity Slow  Appearance/Hygiene Disheveled  Behavior Characteristics Unwilling to participate  Mood Irritable  Thought Process  Coherency Disorganized  Content Preoccupation  Delusions None reported or observed  Perception WDL  Hallucination None reported or observed  Judgment Poor  Confusion None  Danger to Self  Current suicidal ideation? Denies  Danger to Others  Danger to Others None reported or observed  Danger to Others Abnormal  Harmful Behavior to others No threats or harm toward other people

## 2021-07-04 NOTE — Plan of Care (Signed)
°  Problem: Coping: Goal: Ability to verbalize frustrations and anger appropriately will improve Outcome: Progressing   Problem: Coping: Goal: Ability to demonstrate self-control will improve Outcome: Progressing   Problem: Safety: Goal: Periods of time without injury will increase Outcome: Progressing   Problem: Coping: Goal: Coping ability will improve Outcome: Progressing   Problem: Health Behavior/Discharge Planning: Goal: Compliance with prescribed medication regimen will improve Outcome: Progressing

## 2021-07-04 NOTE — [facility] Group Notes (Signed)
Adult Psychoeducational Group Not °Date:  07/04/2021 °Time:  0900-1045 °Group Topic/Focus: PROGRESSIVE RELAXATION. [name] group where deep breathing is taught and tensing and relaxation muscle groups is used. Imagery is used as well.  Pts are asked to imagine 3 pillars that hold them up when they are not able to hold themselves up. ° °Participation Level: did not attend ° °Mark [name] ° ° °

## 2021-07-04 NOTE — Progress Notes (Signed)
Patient was observed up in the dayroom more tonight. He attended group and watched tv for a while before going to his room to rest. Writer spoke with him and he reported that he did not need any medication tonight. He felt like he would be fine. Writer found it difficult to follow patient in conversation because it is difficult to understand some of what he is trying to say. Support given and safety maintained on unit with 15 min checks.

## 2021-07-05 ENCOUNTER — Encounter (HOSPITAL_COMMUNITY): Payer: Self-pay

## 2021-07-05 LAB — RAPID URINE DRUG SCREEN, HOSP PERFORMED
Amphetamines: NOT DETECTED
Barbiturates: NOT DETECTED
Benzodiazepines: NOT DETECTED
Cocaine: NOT DETECTED
Opiates: NOT DETECTED
Tetrahydrocannabinol: NOT DETECTED

## 2021-07-05 NOTE — Progress Notes (Signed)
°   07/05/21 2010  Psych Admission Type (Psych Patients Only)  Admission Status Involuntary  Psychosocial Assessment  Patient Complaints None  Eye Contact Brief  Facial Expression Anxious  Affect Anxious  Speech Logical/coherent  Interaction Assertive  Motor Activity Slow  Appearance/Hygiene Disheveled  Behavior Characteristics Cooperative  Mood Anxious  Thought Process  Coherency Disorganized  Content Preoccupation  Delusions None reported or observed  Perception UTA  Hallucination None reported or observed  Judgment Limited  Confusion None  Danger to Self  Current suicidal ideation? Denies  Danger to Others  Danger to Others None reported or observed   Pt seen in dayroom. Pt denies SI, HI, AVH and pain. Pt denies anxiety and depression. Pt holding his head in his hands. Speech garbled. Wanted PRNs for sleep tonight. Takes medication as prescribed.

## 2021-07-05 NOTE — Progress Notes (Signed)
Adult Psychoeducational Group Note  Date:  07/05/2021 Time:  8:41 PM  Group Topic/Focus:  Wrap-Up Group:   The focus of this group is to help patients review their daily goal of treatment and discuss progress on daily workbooks.  Participation Level:  Minimal  Participation Quality:  Appropriate  Affect:  Appropriate  Cognitive:  Appropriate  Insight: Limited  Engagement in Group:  Limited  Modes of Intervention:  Discussion  Additional Comments:  Pt stated his goal for today was to focus on his treatment plan. Pt stated he accomplished his goal today. Pt stated he talked with his doctor and social worker about his care today. Pt rated his overall day a 7 out of 10. Pt stated he made no calls today. Pt stated he felt better about himself today. Pt stated he was able to attend lunch and dinner tonight. Pt stated he took all medications provided today.  Pt stated his appetite was pretty good today. Pt rated sleep last night was poor. Pt stated the goal tonight was to get some rest. Pt stated he had no physical pain tonight. Pt deny visual hallucinations and auditory issues tonight. Pt denies thoughts of harming himself or others. Pt stated he would alert staff if anything changed  Felipa Furnace 07/05/2021, 8:41 PM

## 2021-07-05 NOTE — BH IP Treatment Plan (Signed)
Interdisciplinary Treatment and Diagnostic Plan Update  07/05/2021 Time of Session: 9:55am NUNO BRUBACHER MRN: [id number]  Principal Diagnosis: Schizophrenia [facility])  Secondary Diagnoses: Principal Problem:   Schizophrenia (HCC)   Current Medications:  Current Facility-Administered Medications  Medication Dose Route Frequency Provider Last Rate Last Admin   acetaminophen (TYLENOL) tablet 650 mg  650 mg Oral Q6H PRN Starkes-Perry, Juel Burrow, FNP       alum & mag hydroxide-simeth (MAALOX/MYLANTA) 200-200-20 MG/5ML suspension 30 mL  30 mL Oral Q4H PRN Starkes-Perry, Juel Burrow, FNP       divalproex (DEPAKOTE ER) 24 hr tablet 500 mg  500 mg Oral BID Maryagnes Amos, FNP   500 mg at 07/05/21 5456   gabapentin (NEURONTIN) capsule 400 mg  400 mg Oral TID Maryagnes Amos, FNP   400 mg at 07/05/21 2563   haloperidol (HALDOL) tablet 5 mg  5 mg Oral BID Lauro Franklin, MD   5 mg at 07/05/21 8937   Or   haloperidol lactate (HALDOL) injection 5 mg  5 mg Intramuscular BID Lauro Franklin, MD       magnesium hydroxide (MILK OF MAGNESIA) suspension 30 mL  30 mL Oral Daily PRN Starkes-Perry, Juel Burrow, FNP       pantoprazole (PROTONIX) EC tablet 20 mg  20 mg Oral Daily Leone Haven, Traci Sermon, MD   20 mg at 07/05/21 0835   propranolol (INDERAL) tablet 10 mg  10 mg Oral Daily Maryagnes Amos, FNP   10 mg at 07/05/21 0834   traZODone (DESYREL) tablet 50 mg  50 mg Oral QHS PRN,MR X 1 Nira Conn A, NP   50 mg at 07/03/21 2102   ziprasidone (GEODON) injection 20 mg  20 mg Intramuscular Q12H PRN Maryagnes Amos, FNP   20 mg at 06/29/21 1615   PTA Medications: Medications Prior to Admission  Medication Sig Dispense Refill Last Dose   cloZAPine (CLOZARIL) 100 MG tablet Take 1 tablet (100 mg total) by mouth daily. (Patient taking differently: Take 400 mg by mouth daily.) 60 tablet 0    divalproex (DEPAKOTE ER) 500 MG 24 hr tablet Take 1 tablet (500 mg total) by mouth at bedtime.  (Patient taking differently: Take 500 mg by mouth 2 (two) times daily.) 30 tablet 0    gabapentin (NEURONTIN) 400 MG capsule Take 400 mg by mouth 3 (three) times daily.      propranolol (INDERAL) 10 MG tablet Take 1 tablet (10 mg total) by mouth daily. 30 tablet 0    Vitamin D, Ergocalciferol, (DRISDOL) 1.25 MG (50000 UNIT) CAPS capsule Take 50,000 Units by mouth once a week. (Patient not taking: Reported on 06/28/2021)       Patient Stressors: Medication change or noncompliance    Patient Strengths: Supportive family/friends   Treatment Modalities: Medication Management, Group therapy, Case management,  1 to 1 session with clinician, Psychoeducation, Recreational therapy.   Physician Treatment Plan for Primary Diagnosis: Schizophrenia (HCC) Long Term Goal(s): Improvement in symptoms so as ready for discharge   Short Term Goals: Ability to identify changes in lifestyle to reduce recurrence of condition will improve Ability to verbalize feelings will improve Ability to demonstrate self-control will improve Ability to maintain clinical measurements within normal limits will improve  Medication Management: Evaluate patient's response, side effects, and tolerance of medication regimen.  Therapeutic Interventions: 1 to 1 sessions, Unit Group sessions and Medication administration.  Evaluation of Outcomes: Progressing  Physician Treatment Plan for Secondary Diagnosis: Principal Problem:  Schizophrenia (HCC)  Long Term Goal(s): Improvement in symptoms so as ready for discharge   Short Term Goals: Ability to identify changes in lifestyle to reduce recurrence of condition will improve Ability to verbalize feelings will improve Ability to demonstrate self-control will improve Ability to maintain clinical measurements within normal limits will improve     Medication Management: Evaluate patient's response, side effects, and tolerance of medication regimen.  Therapeutic Interventions: 1  to 1 sessions, Unit Group sessions and Medication administration.  Evaluation of Outcomes: Progressing   RN Treatment Plan for Primary Diagnosis: Schizophrenia (HCC) Long Term Goal(s): Knowledge of disease and therapeutic regimen to maintain health will improve  Short Term Goals: Ability to remain free from injury will improve, Ability to verbalize frustration and anger appropriately will improve, Ability to identify and develop effective coping behaviors will improve, and Compliance with prescribed medications will improve  Medication Management: RN will administer medications as ordered by provider, will assess and evaluate patient's response and provide education to patient for prescribed medication. RN will report any adverse and/or side effects to prescribing provider.  Therapeutic Interventions: 1 on 1 counseling sessions, Psychoeducation, Medication administration, Evaluate responses to treatment, Monitor vital signs and CBGs as ordered, Perform/monitor CIWA, COWS, AIMS and Fall Risk screenings as ordered, Perform wound care treatments as ordered.  Evaluation of Outcomes: Progressing   LCSW Treatment Plan for Primary Diagnosis: Schizophrenia (HCC) Long Term Goal(s): Safe transition to appropriate next level of care at discharge, Engage patient in therapeutic group addressing interpersonal concerns.  Short Term Goals: Engage patient in aftercare planning with referrals and resources, Increase social support, Identify triggers associated with mental health/substance abuse issues, and Increase skills for wellness and recovery  Therapeutic Interventions: Assess for all discharge needs, 1 to 1 time with Social worker, Explore available resources and support systems, Assess for adequacy in community support network, Educate family and significant other(s) on suicide prevention, Complete Psychosocial Assessment, Interpersonal group therapy.  Evaluation of Outcomes: Progressing   Progress  in Treatment: Attending groups: No. Participating in groups: No. Taking medication as prescribed: Yes. Toleration medication: Yes. Family/Significant other contact made: Yes, individual(s) contacted:  father Patient understands diagnosis: No. Discussing patient identified problems/goals with staff: Yes. Medical problems stabilized or resolved: Yes. Denies suicidal/homicidal ideation: Yes. Issues/concerns per patient self-inventory: No.   New problem(s) identified: No, Describe:  none  New Short Term/Long Term Goal(s): medication stabilization, elimination of SI thoughts, development of comprehensive mental wellness plan.    Patient Goals:  Did not attend  Discharge Plan or Barriers: Patient is to return home and follow up with [facility]  Reason for Continuation of Hospitalization: Medication stabilization  Estimated Length of Stay: 1 day   Scribe for Treatment Team: Otelia Santee, LCSW 07/05/2021 11:17 AM

## 2021-07-05 NOTE — Progress Notes (Signed)
Pt was encouraged but didn't attend orientation/goals group. ?

## 2021-07-05 NOTE — Progress Notes (Signed)
DAR NOTE: Patient presents with calm affect and mood.  Denies pain, auditory and visual hallucinations.  Rates depression at 0, hopelessness at 0, and anxiety at 0.  Maintained on routine safety checks.  Medications given as prescribed.  Support and encouragement offered as needed.  Attended group and participated.  States goal for today is "discharge."  Patient observed visible in milieu watching TV in the dayroom.  Offered no complaint.
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
## 2021-07-05 NOTE — Group Note (Signed)
LCSW Group Therapy Note     Group Date: 07/02/2021 Start Time: 1300 End Time: 1400   Therapy Type: Group Therapy   Participation Level:  Active   Description of Group: Patients received a worksheet with an outline of 2 faces. One side is designated for what the pt sees about themselves and the other is what others see. Pt's were asked to introduce themselves and share something they like about themself. Pts were then asked to draw, write or color how they view themselves as well as how they are viewed by others. CSW led discussion about the feelings and words associated with each side.    Patient Summary: Patient came to group half way through group.  He was disorganized but active during group. .  The Pt accepted the 2 worksheets and participated by filling in both worksheets.  The Pt participated in the group discussion and had fair insight with group topic by sharing their thoughts and feelings about who they are and how that compares and contrasts with how they believe other people view them.       Mark Kontos, LCSW, LCAS Clincal Social Worker  [facility]

## 2021-07-05 NOTE — Progress Notes (Signed)
Pt was encouraged but didn't attend therapeutic relaxation group. ?

## 2021-07-05 NOTE — Group Note (Signed)
Recreation Therapy Group Note   Group Topic:Self-Esteem  Group Date: 07/05/2021 Start Time: 1002 End Time: 1050 Facilitators: Caroll Rancher, LRT,CTRS Location: [address] Dayroom   Goal Area(s) Addresses:  Patient will identify and write at least one positive trait about themself. Patient will acknowledge the benefit of healthy self-esteem. Patient will endorse understanding of ways to increase self-esteem.   Group Description: LRT began group session with open dialogue asking the patients to define self-esteem and verbally identify positive qualities and traits people may possess. Patients were then instructed to design a personalized license plate, with words and drawings, representing at least 3 positive things about themselves. Patients were encouraged to include favorites, things they are proud of, what they enjoy doing, and dreams for their future. If a patient had a life motto or a meaningful phase that expressed their life values, pt's were asked to incorporate that into their design as well. Patients were given the opportunity to share their completed work with the group.    Affect/Mood: Appropriate   Participation Level: Minimal   Participation Quality: Independent   Behavior: Appropriate   Speech/Thought Process: Coherent   Insight: None   Judgement: None   Modes of Intervention: Art and Music   Patient Response to Interventions:  Receptive   Education Outcome:  Acknowledges education and In group clarification offered    Clinical Observations/Individualized Feedback: Pt didn't participate in activity.  Pt sang along with some of the music as it played and made a cup of coffee.  Pt stayed for a few minutes before leaving and not returning.    Plan: Continue to engage patient in RT group sessions 2-3x/week.   Caroll Rancher, LRT,CTRS 07/05/2021 1:24 PM

## 2021-07-06 MED ORDER — BENZTROPINE MESYLATE 0.5 MG PO TABS
0.5000 mg | ORAL_TABLET | Freq: Two times a day (BID) | ORAL | Status: DC
  Administered 2021-07-06 – 2021-07-07 (×2): 0.5 mg via ORAL
  Filled 2021-07-06 (×5): qty 1

## 2021-07-06 NOTE — Progress Notes (Signed)
Pt c/o back pain earlier. Yelling and hitting the bed. Offered cold/heat packs and pt refused. Pt was given pain medication earlier and it is not time yet for re-administration. Provider notified and will come see pt as soon as he is able.

## 2021-07-06 NOTE — [facility] Suicide Risk Assessment (Addendum)
Suicide Risk Assessment ? ?Discharge Assessment    ?[facility] Discharge Suicide Risk Assessment ? ? ?Principal Problem: Schizophrenia ([place]) ?Discharge Diagnoses: Principal Problem: ?  Schizophrenia ([place]) ? ?During the patient's hospitalization, patient had extensive initial psychiatric evaluation, and follow-up psychiatric evaluations every day. ? ?Psychiatric diagnoses provided upon initial assessment: Schizophrenia ? ?Patient's psychiatric medications were adjusted on admission: Started on Zyprexa as we could not contact his father about his Clozaril compliance and the high dose (400 mg).  We did not restart Clozaril because we were unsure if he would continue to take it or have someone supervise his taking of it. ? ?During the hospitalization, other adjustments were made to the patient's psychiatric medication regimen: Switched from Zyprexa to Haldol. ? ?Gradually, patient started adjusting to milieu.   ?Patient's care was discussed during the interdisciplinary team meeting every day during the hospitalization. ? ?The patient is not having side effects to prescribed psychiatric medication. ? ?The patient reports their target psychiatric symptoms of incoherence and poor self care responded well to the psychiatric medications, and the patient reports overall benefit other psychiatric hospitalization. Supportive psychotherapy was provided to the patient. The patient also participated in regular group therapy while admitted.  ? ?Labs were reviewed with the patient, and abnormal results were discussed with the patient. ? ?The patient denied having suicidal thoughts more than 48 hours prior to discharge.  Patient denies having homicidal thoughts.  Patient denies having auditory hallucinations.  Patient denies any visual hallucinations.  Patient denies having paranoid thoughts. ? ?The patient is able to verbalize their individual safety plan to this provider. ? ?It is recommended to the patient to continue psychiatric  medications as prescribed, after discharge from the hospital.   ? ?It is recommended to the patient to follow up with your outpatient psychiatric provider and PCP. ? ?Discussed with the patient, the impact of alcohol, drugs, tobacco have been there overall psychiatric and medical wellbeing, and total abstinence from substance use was recommended the patient. ? ?Total Time spent with patient: 20 minutes ? ?Musculoskeletal: ?Strength & Muscle Tone: within normal limits ?Gait & Station: normal ?Patient leans: N/A ? ?Psychiatric Specialty Exam ? ?Presentation  ?General Appearance: Appropriate for Environment; Casual; Fairly Groomed ? ?Eye Contact:Fair ? ?Speech:Clear and Coherent; Normal Rate ? ?Speech Volume:Normal ? ?Handedness:Right ? ? ?Mood and Affect  ?Mood:Euthymic (mildly irritable) ? ?Duration of Depression Symptoms: Greater than two weeks ? ?Affect:Congruent ? ? ?Thought Process  ?Thought Processes:Goal Directed ? ?Descriptions of Associations:Intact ? ?Orientation:Full (Time, Place and Person) ? ?Thought Content:Logical ? ?History of Schizophrenia/Schizoaffective disorder:Yes ? ?Duration of Psychotic Symptoms:No data recorded ?Hallucinations:Hallucinations: None ? ?Ideas of Reference:None ? ?Suicidal Thoughts:Suicidal Thoughts: No ? ?Homicidal Thoughts:Homicidal Thoughts: No ? ? ?Sensorium  ?Memory:Immediate Fair; Recent Fair ? ?Judgment:Poor (baseline) ? ?Insight:Shallow (baseline) ? ? ?Executive Functions  ?Concentration:Fair ? ?Attention Span:Fair ? ?Recall:Poor ? ?Fund of Knowledge:Poor ? ?Language:Poor ? ? ?Psychomotor Activity  ?Psychomotor Activity:Psychomotor Activity: Tremor ? ? ?Assets  ?Assets:Social Support; Housing; Resilience ? ? ?Sleep  ?Sleep:Sleep: -- (he reports as good) ? ? ?Physical Exam: ?Physical Exam ?Constitutional:   ?   General: He is not in acute distress. ?   Appearance: Normal appearance. He is normal weight. He is not ill-appearing or toxic-appearing.  ?HENT:  ?   Head:  Normocephalic and atraumatic.  ?Pulmonary:  ?   Effort: Pulmonary effort is normal.  ?Musculoskeletal:     ?   General: Normal range of motion.  ?Neurological:  ?   General: No  focal deficit present.  ?   Mental Status: He is alert.  ? ?Review of Systems  ?Respiratory:  Negative for cough and shortness of breath.   ?Cardiovascular:  Negative for chest pain.  ?Gastrointestinal:  Negative for abdominal pain, constipation, diarrhea, nausea and vomiting.  ?Neurological:  Negative for dizziness, weakness and headaches.  ?Psychiatric/Behavioral:  Negative for depression, hallucinations and suicidal ideas. The patient is not nervous/anxious.   ?Blood pressure (!) 155/77, pulse 62, temperature 98 ?F (36.7 ?C), temperature source Oral, resp. rate 18, SpO2 100 %. There is no height or weight on file to calculate BMI. ? ?Mental Status Per Nursing Assessment::   ?On Admission:    ? ?Demographic Factors:  ?Male ? ?Loss Factors: ?NA ? ?Historical Factors: ?Impulsivity ? ?Risk Reduction Factors:   ?Living with another person, especially a relative and Positive social support ? ?Continued Clinical Symptoms:  ?Previous Psychiatric Diagnoses and Treatments ?Reduced Baseline ? ?Cognitive Features That Contribute To Risk:  ?Loss of executive function   ? ?Suicide Risk:  ?Minimal: No identifiable suicidal ideation.  Patients presenting with no risk factors but with morbid ruminations; may be classified as minimal risk based on the severity of the depressive symptoms ? ? Follow-up Information   ? ? [facility] Follow up on 07/12/2021.   ?Why: You have a hospital follow up appointment for therapy and medication management services on 07/12/21 at 10:30 am.  This will be a Virtual telehealth appointment. ?Contact information: ?[address]  ?[address] ?[place] [address] ?[phone] ? ? ?  ?  ? ?  ?  ? ?  ? ? ?Plan Of Care/Follow-up recommendations:  ?Activity: as tolerated ? ?Diet: heart healthy ? ?Other: ?-Follow-up with your outpatient  psychiatric provider -instructions on appointment date, time, and address (location) are provided to you in discharge paperwork. ? ?-Take your psychiatric medications as prescribed at discharge - instructions are provided to you in the discharge paperwork ? ?-Follow-up with outpatient primary care doctor and other specialists -for management of chronic medical disease, including: You will need routine monitoring of your- weight, A1c, Lipid Panel, CMP, CBC, and EKG because of your antipsychotic.  You will need to follow up with your elevated Blood Pressure. ? ?-Testing: Follow-up with outpatient provider for abnormal lab results: mildly elevated A1c ? ?-Recommend abstinence from alcohol, tobacco, and other illicit drug use at discharge.  ? ?-If your psychiatric symptoms recur, worsen, or if you have side effects to your psychiatric medications, call your outpatient psychiatric provider, 911, [phone] or go to the nearest emergency department. ? ?-If suicidal thoughts recur, call your outpatient psychiatric provider, 911, [phone] or go to the nearest emergency department. ? ? ?Briant Cedar, MD ?07/07/2021, 1:16 PM ?

## 2021-07-06 NOTE — Progress Notes (Signed)
Pt would not keep his arm still during BP assessment. Could not get accurate reading. Pt refused to do it again saying cuff was too tight.

## 2021-07-06 NOTE — [facility] Group Notes (Signed)
Pt did not attend wrap up group this evening. Pt was in their room.  

## 2021-07-06 NOTE — Progress Notes (Signed)
°  [facility] Adult Case Management Discharge Plan :  Will you be returning to the same living situation after discharge:  Yes,  home with father At discharge, do you have transportation home?: Yes,  father to pick this patient up Do you have the ability to pay for your medications: Yes,  has insurance  Release of information consent forms completed and in the chart;  Patient's signature needed at discharge.  Patient to Follow up at:  Follow-up Information     [facility] Follow up on 07/12/2021.   Why: You have a hospital follow up appointment for therapy and medication management services on 07/12/21 at 10:30 am.  This will be a Virtual telehealth appointment. Contact information: [place]  [place]  [address] [phone]                 Next level of care provider has access to [place] and Suicide Prevention discussed: Yes,  with father     Has patient been referred to the Quitline?: N/A patient is not a smoker  Patient has been referred for addiction treatment: N/A  Vassie Moselle, LCSW 07/06/2021, 9:56 AM

## 2021-07-06 NOTE — [facility] Group Notes (Signed)
Spirituality group facilitated by Chaplain Katy Laina Guerrieri, [facility].   Group Description: Group focused on topic of hope. Patients participated in facilitated discussion around topic, connecting with one another around experiences and definitions for hope. Group members engaged with visual explorer photos, reflecting on what hope looks like for them today. Group engaged in discussion around how their definitions of hope are present today in hospital.   Modalities: Psycho-social ed, [place], Narrative, MI   Patient Progress: Did not attend.  

## 2021-07-06 NOTE — Progress Notes (Signed)
Notified by nursing staff that Patient was complaining of back pain.  Patient seen and examined face-to-face by this provider with MHT present.  Patient reports that he is left lower back was hurting earlier today, but states that he is no longer having any back pain and denies any back pain at this time.  Patient does endorse tremor of his bilateral hands. He denies any additional pain, headache, lightheadedness, dizziness, vision changes, chest pain, shortness of breath, abdominal pain, nausea, vomiting, numbness, tingling, GU symptoms, fever, chills, or any additional physical symptoms.  On exam, patient is lying down in his bed in his room of the [facility] hall, in no acute distress.  Heart regular rate and rhythm, no murmurs, rubs, or gallops noted.  Lungs are clear to auscultation in bilateral anterior and posterior lung fields.  Normal respiratory effort.  No respiratory distress.  Respirations are even and unlabored.  Patient refused to sit up from lying position for this provider to conduct a full examination of patient's back, but did rotate onto a side-lying position on his left side to allow this provider to examine his back from this position.  Patient's back nontender to palpation.  No physical deformities or swelling noted of patient's back.  Patient's paraspinal muscles nontender to palpation.  Patient does have mild tremor of bilateral hands/upper extremities.  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  Furthermore, per chart review of today's 07/06/2021 treatment team progress note, patient was started on Cogentin 0.5 mg p.o. twice daily for these above symptoms.  Based on  patient's current presentation, including patient's reported symptoms (including patient denying back pain at this time- see details above) as well as my evaluation of the patient, do not suspect that patient's back pain is indicative of an emergent medical condition at this time.  Patient may continue to take Tylenol 650 mg p.o. every 6 hours as needed for pain.  As for patient's tremor noted above, patient may continue recently initiated Cogentin 0.5 mg p.o. twice daily for patient's tremor like symptoms/EPS/pseudoparkinsonism noted above.  Patient to notify nursing staff if his back pain returns, if he develops any new physical symptoms, or if any additional issues arise.

## 2021-07-06 NOTE — Progress Notes (Signed)
Pt was encouraged but didn't attend orientation/goals group. ?

## 2021-07-06 NOTE — Discharge Summary (Addendum)
Physician Discharge Summary Note  Patient:  Mark [name] is an 61 y.o., male MRN:  [id number] DOB:  06/18/1960 Patient phone:  [phone] (home)  Patient address:   [address] [place] [id number],  Total Time spent with patient: 20 minutes  Date of Admission:  06/29/2021 Date of Discharge: 07/07/2021  Reason for Admission:   Mark [name] is a 61 yr old male who presented to [facility] on 2/20 voluntarily by law enforcement due to complaints he was aggressive towards his family, he was admitted to [facility] on 2/22.  PPHx is significant for Schizophrenia.      During the entire interview patient's speech was slow and garbled making it difficult to pick out any words.  Often what he said was nonsensical so unsure of the validity of these answers.   When asked what brought him to the hospital he states that it was because he tackled his "so-called father."  He states that they were arguing and he told the father to get out of his house.  He states that at that point the police then came and got him.  When asked to further explain he could not.  Principal Problem: Schizophrenia [facility]) Discharge Diagnoses: Principal Problem:   Schizophrenia [facility])   Past Psychiatric History: Schizophrenia  Past Medical History:  Past Medical History:  Diagnosis Date   COPD (chronic obstructive pulmonary disease) ([place])    Hypertension    Schizophrenia ([place])     Past Surgical History:  Procedure Laterality Date   LOWER EXTREMITY ANGIOGRAPHY N/A 09/18/2017   Procedure: LOWER EXTREMITY ANGIOGRAPHY- Recheck lysis;  Surgeon: Waynetta Sandy, MD;  Location: [place] CV LAB;  Service: Cardiovascular;  Laterality: N/A;   PERIPHERAL VASCULAR INTERVENTION Left 09/18/2017   Procedure: PERIPHERAL VASCULAR INTERVENTION;  Surgeon: Waynetta Sandy, MD;  Location: [place] CV LAB;  Service: Cardiovascular;  Laterality: Left;   THROMBECTOMY FEMORAL ARTERY Left 09/17/2017   Procedure:  POSSIBLE THROMBECTOMY;  Surgeon: Waynetta Sandy, MD;  Location: [place];  Service: Vascular;  Laterality: Left;   VENOGRAM Left 09/17/2017   Procedure: ULTRASOUND POPLITEAL ACCESS; CENTRAL VENOGRAM, IVVS, LYSIS CATHETER PLACEMENT;  Surgeon: Waynetta Sandy, MD;  Location: [place];  Service: Vascular;  Laterality: Left;   Family History: History reviewed. No pertinent family history. Family Psychiatric  History: Unknown cannot assess Social History:  Social History   Substance and Sexual Activity  Alcohol Use No     Social History   Substance and Sexual Activity  Drug Use Never    Social History   Socioeconomic History   Marital status: Single    Spouse name: Not on file   Number of children: Not on file   Years of education: Not on file   Highest education level: Not on file  Occupational History   Not on file  Tobacco Use   Smoking status: Every Day    Packs/day: 0.50    Types: Cigarettes   Smokeless tobacco: Never  Vaping Use   Vaping Use: Never used  Substance and Sexual Activity   Alcohol use: No   Drug use: Never   Sexual activity: Not on file  Other Topics Concern   Not on file  Social History Narrative   Not on file   Social Determinants of Health   Financial Resource Strain: Not on file  Food Insecurity: Not on file  Transportation Needs: Not on file  Physical Activity: Not on file  Stress: Not on file  Social Connections:  Not on file    Hospital Course:   During the patient's hospitalization, patient had extensive initial psychiatric evaluation, and follow-up psychiatric evaluations every day.  Psychiatric diagnoses provided upon initial assessment: Schizophrenia  Patient's psychiatric medications were adjusted on admission: Started on Zyprexa as we could not contact his father about his Clozaril compliance and the high dose (400 mg).  We did not restart Clozaril because we were unsure if he would continue to take it or have someone  supervise his taking of it.  During the hospitalization, other adjustments were made to the patient's psychiatric medication regimen: Switched from Zyprexa to Haldol.  Patient's care was discussed during the interdisciplinary team meeting every day during the hospitalization.  The patient is not having side effects to prescribed psychiatric medication.  Gradually, patient started adjusting to milieu. The patient was evaluated each day by a clinical provider to ascertain response to treatment. Improvement was noted by the patient's report of decreasing symptoms, improved sleep and appetite, affect, medication tolerance, behavior, and participation in unit programming.  Patient was asked each day to complete a self inventory noting mood, mental status, pain, new symptoms, anxiety and concerns.   Symptoms were reported as significantly decreased or resolved completely by discharge.  The patient reports that their mood is stable.  The patient denied having suicidal thoughts for more than 48 hours prior to discharge.  Patient denies having homicidal thoughts.  Patient denies having auditory hallucinations.  Patient denies any visual hallucinations or other symptoms of psychosis.  The patient was motivated to continue taking medication with a goal of continued improvement in mental health.   The patient reports their target psychiatric symptoms of incoherence and poor self care responded well to the psychiatric medications, and the patient reports overall benefit other psychiatric hospitalization. Supportive psychotherapy was provided to the patient. The patient also participated in regular group therapy while hospitalized. Coping skills, problem solving as well as relaxation therapies were also part of the unit programming.  Labs were reviewed with the patient, and abnormal results were discussed with the patient.  The patient is able to verbalize their individual safety plan to this provider.  # It  is recommended to the patient to continue psychiatric medications as prescribed, after discharge from the hospital.    # It is recommended to the patient to follow up with your outpatient psychiatric provider and PCP.  # It was discussed with the patient, the impact of alcohol, drugs, tobacco have been there overall psychiatric and medical wellbeing, and total abstinence from substance use was recommended the patient.ed.  # Prescriptions provided or sent directly to preferred pharmacy at discharge. Patient agreeable to plan. Given opportunity to ask questions. Appears to feel comfortable with discharge.    # In the event of worsening symptoms, the patient is instructed to call the crisis hotline, 911 and or go to the nearest ED for appropriate evaluation and treatment of symptoms. To follow-up with primary care provider for other medical issues, concerns and or health care needs  # Patient was discharged home with a plan to follow up as noted below.    On day of discharge he reports that he is doing good.  He reports he slept well last night.  He reports his appetite is doing good.  He reports no SI, HI, or AVH.  He reports no Parnoia, Ideas of Reference, or other First Rank symptoms.  He reports no issues with his medications.  Discussed with him that he is  currently on Haldol because we could not safely restart his Clozaril but that his outpatient provider may decide to restart him on it.  As his blood pressure was elevated we started Amlodipine which responded well.  Discussed with him the need to follow up with his PCP for further management of his blood pressure.  Discussed with him what to do in the event of a future crisis.  Discussed that he can return to [facility], go to the [facility], go to the nearest ED, or call 911 or [phone].   He reported understanding and had no concerns.  He was discharged home to his father.     Physical Findings: AIMS: Facial and Oral Movements Muscles of Facial Expression:  None, normal Lips and Perioral Area: None, normal Jaw: None, normal Tongue: None, normal,Extremity Movements Upper (arms, wrists, hands, fingers): None, normal (does have a rhythmic tremor in his hands/arms) Lower (legs, knees, ankles, toes): None, normal, Trunk Movements Neck, shoulders, hips: None, normal, Overall Severity Severity of abnormal movements (highest score from questions above): None, normal Incapacitation due to abnormal movements: None, normal Patient's awareness of abnormal movements (rate only patient's report): No Awareness, Dental Status Current problems with teeth and/or dentures?: Yes (missing several teeth) Does patient usually wear dentures?: No  No Cogwheeling or Rigidity Present CIWA:    COWS:     Musculoskeletal: Strength & Muscle Tone: within normal limits Gait & Station: normal Patient leans: N/A   Psychiatric Specialty Exam:  Presentation  General Appearance: Appropriate for Environment; Casual; Fairly Groomed  Eye Contact:Fair  Speech:Clear and Coherent; Normal Rate  Speech Volume:Normal  Handedness:Right   Mood and Affect  Mood:Euthymic (mildly irritable)  Affect:Congruent   Thought Process  Thought Processes:Goal Directed  Descriptions of Associations:Intact  Orientation:Full (Time, Place and Person)  Thought Content:Logical  History of Schizophrenia/Schizoaffective disorder:Yes  Duration of Psychotic Symptoms:No data recorded Hallucinations:Hallucinations: None  Ideas of Reference:None  Suicidal Thoughts:Suicidal Thoughts: No  Homicidal Thoughts:Homicidal Thoughts: No   Sensorium  Memory:Immediate Fair; Recent Fair  Judgment:Poor (baseline)  Insight:Shallow (baseline)   Executive Functions  Concentration:Fair  Attention Span:Fair  Recall:Poor  [employer] of Knowledge:Poor  Language:Poor   Psychomotor Activity  Psychomotor Activity:Psychomotor Activity: Tremor   Assets  Assets:Social Support; Housing;  Resilience   Sleep  Sleep:Sleep: -- (he reports as good)    Physical Exam: Physical Exam Vitals and nursing note reviewed.  Constitutional:      General: He is not in acute distress.    Appearance: Normal appearance. He is normal weight. He is not ill-appearing or toxic-appearing.  HENT:     Head: Normocephalic and atraumatic.  Pulmonary:     Effort: Pulmonary effort is normal.  Musculoskeletal:        General: Normal range of motion.  Neurological:     General: No focal deficit present.     Mental Status: He is alert.   Review of Systems  Respiratory:  Negative for cough and shortness of breath.   Cardiovascular:  Negative for chest pain.  Gastrointestinal:  Negative for abdominal pain, constipation, diarrhea, nausea and vomiting.  Neurological:  Negative for dizziness, weakness and headaches.  Psychiatric/Behavioral:  Negative for depression, hallucinations and suicidal ideas. The patient is not nervous/anxious.   Blood pressure (!) 155/77, pulse 62, temperature 98 F (36.7 C), temperature source Oral, resp. rate 18, SpO2 100 %. There is no height or weight on file to calculate BMI.   Social History   Tobacco Use  Smoking Status Every Day  Packs/day: 0.50   Types: Cigarettes  Smokeless Tobacco Never   Tobacco Cessation:  A prescription for an FDA-approved tobacco cessation medication was offered at discharge and the patient refused   Blood Alcohol level:  Lab Results  Component Value Date   [facility] <10 06/28/2021   ETH <10 [id number]    Metabolic Disorder Labs:  Lab Results  Component Value Date   HGBA1C 6.1 (H) 06/28/2021   MPG 128 06/28/2021   No results found for: PROLACTIN Lab Results  Component Value Date   CHOL 164 07/04/2021   TRIG 139 07/04/2021   HDL 44 07/04/2021   CHOLHDL 3.7 07/04/2021   VLDL 28 07/04/2021   LDLCALC 92 07/04/2021   LDLCALC 102 (H) 02/11/2021    See Psychiatric Specialty Exam and Suicide Risk Assessment completed by  Attending Physician prior to discharge.  Discharge destination:  Home  Is patient on multiple antipsychotic therapies at discharge:  No   Has Patient had three or more failed trials of antipsychotic monotherapy by history:  No  Recommended Plan for Multiple Antipsychotic Therapies: NA  Discharge Instructions     Diet - low sodium heart healthy   Complete by: As directed    Increase activity slowly   Complete by: As directed       Allergies as of 07/07/2021   No Known Allergies      Medication List     STOP taking these medications    cloZAPine 100 MG tablet Commonly known as: Clozaril   Vitamin D (Ergocalciferol) 1.25 MG (50000 UNIT) Caps capsule Commonly known as: DRISDOL       TAKE these medications      Indication  amLODipine 5 MG tablet Commonly known as: NORVASC Take 1 tablet (5 mg total) by mouth daily. Start taking on: July 08, 2021  Indication: High Blood Pressure Disorder   benztropine 1 MG tablet Commonly known as: COGENTIN Take 1 tablet (1 mg total) by mouth 2 (two) times daily.  Indication: Extrapyramidal Reaction caused by Medications   divalproex 500 MG 24 hr tablet Commonly known as: Depakote ER Take 1 tablet (500 mg total) by mouth 2 (two) times daily.  Indication: Mood Stability   gabapentin 400 MG capsule Commonly known as: NEURONTIN Take 400 mg by mouth 3 (three) times daily.  Indication: Social Anxiety Disorder   haloperidol 5 MG tablet Commonly known as: HALDOL Take 1 tablet (5 mg total) by mouth 2 (two) times daily.  Indication: Psychosis   pantoprazole 20 MG tablet Commonly known as: PROTONIX Take 1 tablet (20 mg total) by mouth daily.  Indication: Heartburn   propranolol 10 MG tablet Commonly known as: INDERAL Take 1 tablet (10 mg total) by mouth daily.  Indication: Fine to Coarse Slow Tremor Affecting Head, Hands & Voice, High Blood Pressure Disorder        Follow-up Information     [facility] Follow up on 07/12/2021.    Why: You have a hospital follow up appointment for therapy and medication management services on 07/12/21 at 10:30 am.  This will be a Virtual telehealth appointment. Contact information: [place] [address] [phone]                 Follow-up recommendations/Comments:  Activity: as tolerated   Diet: heart healthy   Other: -Follow-up with your outpatient psychiatric provider -instructions on appointment date, time, and address (location) are provided to you in discharge paperwork.   -Take your psychiatric medications as  prescribed at discharge - instructions are provided to you in the discharge paperwork   -Follow-up with outpatient primary care doctor and other specialists -for management of chronic medical disease, including: You will need routine monitoring of your- weight, A1c, Lipid Panel, CMP, CBC, and EKG because of your antipsychotic.  You will need to follow up with your elevated Blood Pressure.     -Testing: Follow-up with outpatient provider for abnormal lab results: mildly elevated A1c   -Recommend abstinence from alcohol, tobacco, and other illicit drug use at discharge.    -If your psychiatric symptoms recur, worsen, or if you have side effects to your psychiatric medications, call your outpatient psychiatric provider, 911, [phone] or go to the nearest emergency department.   -If suicidal thoughts recur, call your outpatient psychiatric provider, 911, [phone] or go to the nearest emergency department.  Signed: Briant Cedar, MD 07/07/2021, 1:21 PM   Total Time Spent in Direct Patient Care:  I personally spent 45 minutes on the unit in direct patient care. The direct patient care time included face-to-face time with the patient, reviewing the patient's chart, communicating with other professionals, and coordinating care. Greater than 50% of this time was spent in counseling or coordinating care with the patient regarding goals  of hospitalization, psycho-education, and discharge planning needs.   On my assessment the patient denied SI, HI, AVH, paranoia, ideas of reference, or first rank symptoms on day of discharge. Patient denied drug cravings or active signs of withdrawal. Patient denied medication side-effects. Patient was not deemed to be a danger to self or others on day of discharge and was in agreement with discharge plans.    I have independently evaluated the patient during a face-to-face assessment on the day of discharge. I reviewed the patient's chart, and I participated in key portions of the service. I discussed the case with the resident physician, and I agree with the assessment and plan of care as documented in the resident physician's note, as addended by me or notated below:   Pt was discharged on 07/07/2021.  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 Cogentin was started and this will be managed as outpatient by outpatient psychiatric provider.    Janine Limbo, MD Psychiatrist

## 2021-07-06 NOTE — Progress Notes (Signed)
[facility] MD Progress Note  07/06/2021 5:05 PM Mark [name]  MRN:  [id number] Subjective:   Mark [name] is a 61 yr old male who presented to [facility] on 2/20 voluntarily by law enforcement due to complaints he was aggressive towards his family, he was admitted to [facility] on 2/22.  PPHx is significant for Schizophrenia.    Case was discussed in the multidisciplinary team. MAR was reviewed and patient was compliant with medications.  He did not require PRN medications yesterday.   Psychiatric Team made the following recommendations yesterday: -Continue Haldol 5 mg BID -Continue Gabapentin 400 mg TID -Continue Depakote ER 500 mg BID -Continue Agitation Protocol: Geodon/Ativan    On interview today patient reports he slept well last night.  He reports his appetite is doing good.  He reports no SI, HI, or AVH.  He reports no Parnoia, Ideas of Reference, or other First Rank symptoms.   He reports no issues with his medications.  Discussed with him that we were planning for discharge today.  On physical exam he did have some muscle stiffness and his tremor was a little worse than previous.  Discussed that we would start Cogentin for this and that he could continue with this at home.  He reported no other concerns at present.   Update When attempting to administer his morning medication patient became agitated and aggressive.  He required forced medications.  Discussed with him that we would no longer be discharging him today because of his outburst and would hopefully be discharging him tomorrow.    Principal Problem: Schizophrenia (HCC) Diagnosis: Principal Problem:   Schizophrenia (HCC)  Total Time spent with patient:  I personally spent 30 minutes on the unit in direct patient care. The direct patient care time included face-to-face time with the patient, reviewing the patient's chart, communicating with other professionals, and coordinating care. Greater than 50% of this time was spent in  counseling or coordinating care with the patient regarding goals of hospitalization, psycho-education, and discharge planning needs.   Past Psychiatric History: Schizophrenia  Past Medical History:  Past Medical History:  Diagnosis Date   COPD (chronic obstructive pulmonary disease) (HCC)    Hypertension    Schizophrenia (HCC)     Past Surgical History:  Procedure Laterality Date   LOWER EXTREMITY ANGIOGRAPHY N/A 09/18/2017   Procedure: LOWER EXTREMITY ANGIOGRAPHY- Recheck lysis;  Surgeon: Maeola Harman, MD;  Location: [facility] INVASIVE CV LAB;  Service: Cardiovascular;  Laterality: N/A;   PERIPHERAL VASCULAR INTERVENTION Left 09/18/2017   Procedure: PERIPHERAL VASCULAR INTERVENTION;  Surgeon: Maeola Harman, MD;  Location: [facility] INVASIVE CV LAB;  Service: Cardiovascular;  Laterality: Left;   THROMBECTOMY FEMORAL ARTERY Left 09/17/2017   Procedure: POSSIBLE THROMBECTOMY;  Surgeon: Maeola Harman, MD;  Location: [facility] OR;  Service: Vascular;  Laterality: Left;   VENOGRAM Left 09/17/2017   Procedure: ULTRASOUND POPLITEAL ACCESS; CENTRAL VENOGRAM, IVVS, LYSIS CATHETER PLACEMENT;  Surgeon: Maeola Harman, MD;  Location: [facility] OR;  Service: Vascular;  Laterality: Left;   Family History: History reviewed. No pertinent family history. Family Psychiatric  History: Unknown cannot assess Social History:  Social History   Substance and Sexual Activity  Alcohol Use No     Social History   Substance and Sexual Activity  Drug Use Never    Social History   Socioeconomic History   Marital status: Single    Spouse name: Not on file   Number of children: Not on file   Years of education: Not  on file   Highest education level: Not on file  Occupational History   Not on file  Tobacco Use   Smoking status: Every Day    Packs/day: 0.50    Types: Cigarettes   Smokeless tobacco: Never  Vaping Use   Vaping Use: Never used  Substance and Sexual Activity   Alcohol  use: No   Drug use: Never   Sexual activity: Not on file  Other Topics Concern   Not on file  Social History Narrative   Not on file   Social Determinants of Health   Financial Resource Strain: Not on file  Food Insecurity: Not on file  Transportation Needs: Not on file  Physical Activity: Not on file  Stress: Not on file  Social Connections: Not on file   Additional Social History:                         Sleep: Good  Appetite:  Fair  Current Medications: Current Facility-Administered Medications  Medication Dose Route Frequency Provider Last Rate Last Admin   acetaminophen (TYLENOL) tablet 650 mg  650 mg Oral Q6H PRN Starkes-Perry, Juel Burrowakia S, FNP       alum & mag hydroxide-simeth (MAALOX/MYLANTA) 200-200-20 MG/5ML suspension 30 mL  30 mL Oral Q4H PRN Starkes-Perry, Juel Burrowakia S, FNP       benztropine (COGENTIN) tablet 0.5 mg  0.5 mg Oral BID Lauro FranklinPashayan, Manal Kreutzer S, MD   0.5 mg at 07/06/21 1650   divalproex (DEPAKOTE ER) 24 hr tablet 500 mg  500 mg Oral BID Maryagnes AmosStarkes-Perry, Takia S, FNP   500 mg at 07/06/21 1650   gabapentin (NEURONTIN) capsule 400 mg  400 mg Oral TID Maryagnes AmosStarkes-Perry, Takia S, FNP   400 mg at 07/06/21 1650   haloperidol (HALDOL) tablet 5 mg  5 mg Oral BID Lauro FranklinPashayan, Anddy Wingert S, MD   5 mg at 07/06/21 1650   Or   haloperidol lactate (HALDOL) injection 5 mg  5 mg Intramuscular BID Lauro FranklinPashayan, Jeylin Woodmansee S, MD   5 mg at 07/06/21 1002   magnesium hydroxide (MILK OF MAGNESIA) suspension 30 mL  30 mL Oral Daily PRN Maryagnes AmosStarkes-Perry, Takia S, FNP       pantoprazole (PROTONIX) EC tablet 20 mg  20 mg Oral Daily Leone Havenoda, Traci SermonVandana, MD   20 mg at 07/05/21 0835   propranolol (INDERAL) tablet 10 mg  10 mg Oral Daily Maryagnes AmosStarkes-Perry, Takia S, FNP   10 mg at 07/05/21 0834   traZODone (DESYREL) tablet 50 mg  50 mg Oral QHS PRN,MR X 1 Nira ConnBerry, Jason A, NP   50 mg at 07/05/21 2030   ziprasidone (GEODON) injection 20 mg  20 mg Intramuscular Q12H PRN Maryagnes AmosStarkes-Perry, Takia S, FNP   20 mg at 07/06/21  1002    Lab Results:  No results found for this or any previous visit (from the past 48 hour(s)).   Blood Alcohol level:  Lab Results  Component Value Date   ETH <10 06/28/2021   ETH <10 03/12/2020    Metabolic Disorder Labs: Lab Results  Component Value Date   HGBA1C 6.1 (H) 06/28/2021   MPG 128 06/28/2021   No results found for: PROLACTIN Lab Results  Component Value Date   CHOL 164 07/04/2021   TRIG 139 07/04/2021   HDL 44 07/04/2021   CHOLHDL 3.7 07/04/2021   VLDL 28 07/04/2021   LDLCALC 92 07/04/2021   LDLCALC 102 (H) 02/11/2021    Physical Findings: AIMS: Facial and Oral Movements  Muscles of Facial Expression: None, normal Lips and Perioral Area: None, normal Jaw: None, normal Tongue: None, normal,Extremity Movements Upper (arms, wrists, hands, fingers): None, normal (does have a rhythmic tremor in his hands/arms) Lower (legs, knees, ankles, toes): None, normal, Trunk Movements Neck, shoulders, hips: None, normal, Overall Severity Severity of abnormal movements (highest score from questions above): None, normal Incapacitation due to abnormal movements: None, normal Patient's awareness of abnormal movements (rate only patient's report): No Awareness, Dental Status Current problems with teeth and/or dentures?: Yes (missing several teeth) Does patient usually wear dentures?: No  Rigidity present on exam today. CIWA:    COWS:     Musculoskeletal: Strength & Muscle Tone: within normal limits Gait & Station: normal Patient leans: N/A  Psychiatric Specialty Exam:  Presentation  General Appearance: Appropriate for Environment; Casual; Fairly Groomed  Eye Contact:Fair  Speech:Clear and Coherent; Normal Rate  Speech Volume:Normal  Handedness:Right   Mood and Affect  Mood:Euthymic (mildly irritable)  Affect:Congruent   Thought Process  Thought Processes:Goal Directed  Descriptions of Associations:Intact  Orientation:Full (Time, Place and  Person)  Thought Content:Logical  History of Schizophrenia/Schizoaffective disorder:Yes  Duration of Psychotic Symptoms:No data recorded Hallucinations:Hallucinations: None  Ideas of Reference:None  Suicidal Thoughts:Suicidal Thoughts: No  Homicidal Thoughts:Homicidal Thoughts: No   Sensorium  Memory:Immediate Fair; Recent Fair  Judgment:Poor (baseline)  Insight:Shallow (baseline)   Executive Functions  Concentration:Fair  Attention Span:Fair  Recall:Poor  Progress Energy of Knowledge:Poor  Language:Poor   Psychomotor Activity  Psychomotor Activity:Psychomotor Activity: Tremor   Assets  Assets:Social Support; Housing; Resilience   Sleep  Sleep:Sleep: -- (he reports as good)    Physical Exam: Physical Exam Vitals and nursing note reviewed.  Constitutional:      General: He is not in acute distress.    Appearance: Normal appearance. He is normal weight. He is not ill-appearing or toxic-appearing.  HENT:     Head: Normocephalic and atraumatic.  Pulmonary:     Effort: Pulmonary effort is normal.  Musculoskeletal:        General: Normal range of motion.  Neurological:     General: No focal deficit present.     Mental Status: He is alert.   Review of Systems  Respiratory:  Negative for cough and shortness of breath.   Cardiovascular:  Negative for chest pain.  Gastrointestinal:  Negative for abdominal pain, constipation, diarrhea, nausea and vomiting.  Neurological:  Negative for dizziness, weakness and headaches.  Psychiatric/Behavioral:  Negative for depression, hallucinations and suicidal ideas. The patient is not nervous/anxious.   Blood pressure 137/81, pulse 70, temperature 98.9 F (37.2 C), resp. rate 16, SpO2 100 %. There is no height or weight on file to calculate BMI.   Treatment Plan Summary: Daily contact with patient to assess and evaluate symptoms and progress in treatment and Medication management  Mark [name] is a 61 yr old male who  presented to [facility] on 2/20 voluntarily by law enforcement due to complaints he was aggressive towards his family, he was admitted to [facility] on 2/22.  PPHx is significant for Schizophrenia.     Mark [name] had been showing improvement and originally had been scheduled for discharge today.  On exam he did have some stiffness in his arms and his rhythmic tremor was more pronounced today.  We recommended starting Cogentin for this and discussed that he could continue at home.  Later when attempting to give him his morning medication he became agitated and aggressive requiring forced medication so have canceled discharge for today.  We  will plan for discharge tomorrow.  We will continue to monitor.      Schizophrenia: -Second Opinion for Forced Medications Over Objection placed 2/24 by Dr. Rebecca Eaton  -Continue Haldol 5 mg PO BID or 5 mg IM back if refuses -Start Cogentin 0.5 BID -Continue Gabapentin 400 mg TID -Continue Depakote ER 500 mg BID -Continue Agitation Protocol: Geodon/Ativan     -Continue Propanolol 10 mg daily -Continue PRN's: Tylenol, Maalox, Milk of Magnesia   Lauro Franklin, MD 07/06/2021, 5:05 PM

## 2021-07-06 NOTE — Plan of Care (Signed)
°  Problem: Activity: Goal: Interest or engagement in activities will improve Outcome: Progressing   Problem: Coping: Goal: Ability to demonstrate self-control will improve Outcome: Progressing   Problem: Safety: Goal: Periods of time without injury will increase Outcome: Progressing   Problem: Education: Goal: Knowledge of the prescribed therapeutic regimen will improve Outcome: Not Progressing   Problem: Health Behavior/Discharge Planning: Goal: Compliance with prescribed medication regimen will improve Outcome: Not Progressing

## 2021-07-06 NOTE — Progress Notes (Addendum)
°   07/06/21 1035  Psych Admission Type (Psych Patients Only)  Admission Status Voluntary  Psychosocial Assessment  Patient Complaints None  Eye Contact Fair  Facial Expression Anxious;Wide-eyed  Affect Anxious;Preoccupied  Speech Rapid;Logical/coherent  Interaction Minimal  Motor Activity Slow  Appearance/Hygiene Disheveled  Behavior Characteristics Irritable  Mood Preoccupied  Thought Process  Coherency Disorganized  Content Preoccupation  Delusions None reported or observed  Perception WDL  Hallucination None reported or observed  Judgment Limited  Confusion None  Danger to Self  Current suicidal ideation? Denies  Danger to Others  Danger to Others None reported or observed  Danger to Others Abnormal  Harmful Behavior to others No threats or harm toward other people  Destructive Behavior No threats or harm toward property

## 2021-07-06 NOTE — Group Note (Signed)
Recreation Therapy Group Note   Group Topic:Healthy Decision Making  Group Date: 07/06/2021 Start Time: 1937 End Time: 1035 Facilitators: Caroll Rancher, LRT,CTRS Location: [address] Dayroom   Goal Area(s) Addresses:  Patient will effectively work with peer towards shared goal.  Patient will identify factors that guided their decision making.  Patient will pro-socially communicate ideas during group session.   Group Description: Patients were given a scenario that they were going to be stranded on a deserted [place] for several months before being rescued. Writer tasked them with making a list of 15 things they would choose to bring with them for "survival". The list of items was prioritized most important to least. Each patient would come up with their own list, then work together to create a new list of 15 items while in a group of 3-5 peers. LRT discussed each person's list and how it differed from others. The debrief included discussion of priorities, good decisions versus bad decisions, and how it is important to think before acting so we can make the best decision possible. LRT tied the concept of effective communication among group members to patient's support systems outside of the hospital and its benefit post discharge.   Affect/Mood: N/A   Participation Level: Did not attend    Clinical Observations/Individualized Feedback:     Plan: Continue to engage patient in RT group sessions 2-3x/week.   Caroll Rancher, LRT,CTRS 07/06/2021 12:01 PM

## 2021-07-06 NOTE — Progress Notes (Signed)
°   07/06/21 2110  Psych Admission Type (Psych Patients Only)  Admission Status Voluntary  Psychosocial Assessment  Patient Complaints Other (Comment) (back pain)  Eye Contact Avoids  Facial Expression Anxious  Affect Anxious;Preoccupied  Speech Tangential  Interaction Minimal  Motor Activity Tremors (upper and lower extremity)  Behavior Characteristics Irritable  Mood Preoccupied  Thought Process  Content Preoccupation  Delusions None reported or observed  Perception WDL  Hallucination None reported or observed  Judgment Limited  Confusion None  Danger to Self  Current suicidal ideation? Denies  Danger to Others  Danger to Others None reported or observed   Pt seen in his room. Refused to come up to med window to get his medication. Pt tremulous on the bed. Increase in tremors from previous night. "They hurt me. I'm trying to keep them from hurting me." Pt shaking and pointing to his back. Says his back hurts. Pt was previously given Tylenol and is not time for re-medication. Pt offered hot or cold packs. Some cold packs are present on his bed with him. "They just the same." Pt moaning and hitting the bed frame because he says he is in pain. Provider notified.

## 2021-07-07 MED ORDER — AMLODIPINE BESYLATE 5 MG PO TABS: 5.0000 mg | ORAL_TABLET | Freq: Every day | ORAL | 0 refills | Status: DC

## 2021-07-07 MED ORDER — AMLODIPINE BESYLATE 5 MG PO TABS
5.0000 mg | ORAL_TABLET | Freq: Every day | ORAL | Status: DC
  Administered 2021-07-07: 5 mg via ORAL
  Filled 2021-07-07 (×4): qty 1

## 2021-07-07 MED ORDER — DIVALPROEX SODIUM ER 500 MG PO TB24
500.0000 mg | ORAL_TABLET | Freq: Two times a day (BID) | ORAL | 0 refills | Status: DC
Start: 2021-07-07 — End: 2022-01-04

## 2021-07-07 MED ORDER — BENZTROPINE MESYLATE 1 MG PO TABS
1.0000 mg | ORAL_TABLET | Freq: Two times a day (BID) | ORAL | Status: DC
Start: 2021-07-07 — End: 2021-07-07
  Filled 2021-07-07 (×4): qty 1

## 2021-07-07 MED ORDER — PROPRANOLOL HCL 10 MG PO TABS: 10.0000 mg | ORAL_TABLET | Freq: Every day | ORAL | 0 refills | Status: DC

## 2021-07-07 MED ORDER — CLONIDINE HCL 0.1 MG PO TABS
0.1000 mg | ORAL_TABLET | Freq: Once | ORAL | Status: AC
  Administered 2021-07-07: 0.1 mg via ORAL
  Filled 2021-07-07 (×2): qty 1

## 2021-07-07 MED ORDER — BENZTROPINE MESYLATE 1 MG PO TABS: 1.0000 mg | ORAL_TABLET | Freq: Two times a day (BID) | ORAL | 0 refills | Status: DC

## 2021-07-07 MED ORDER — HALOPERIDOL 5 MG PO TABS: 5.0000 mg | ORAL_TABLET | Freq: Two times a day (BID) | ORAL | 0 refills | Status: DC

## 2021-07-07 MED ORDER — PANTOPRAZOLE SODIUM 20 MG PO TBEC: 20.0000 mg | DELAYED_RELEASE_TABLET | Freq: Every day | ORAL | 0 refills | Status: DC

## 2021-07-07 MED ORDER — LORAZEPAM 1 MG PO TABS
1.0000 mg | ORAL_TABLET | Freq: Once | ORAL | Status: AC
  Administered 2021-07-07: 1 mg via ORAL
  Filled 2021-07-07: qty 1

## 2021-07-07 NOTE — [facility] Group Notes (Signed)
Adult Psychoeducational Group Note ? ?Date:  07/07/2021 ?Time:  9:10 AM ? ?Group Topic/Focus:  ?Goals Group:   The focus of this group is to help patients establish daily goals to achieve during treatment and discuss how the patient can incorporate goal setting into their daily lives to aide in recovery. ? ?Participation Level:  Active ? ?Participation Quality:  Intrusive ? ?Affect:  Appropriate ? ?Cognitive:  Disorganized ? ?Insight: Lacking ? ?Engagement in Group:  Lacking ? ?Modes of Intervention:  Discussion ? ?Additional Comments:   ? ?Donell Beers ?07/07/2021, 9:10 AM ?

## 2021-07-07 NOTE — Plan of Care (Signed)
Pt was able to demonstrate calm and appropriate behavior during participation in one recreation therapy group session. ? ? ? ?Caroll Rancher, LRT,CTRS ?

## 2021-07-07 NOTE — Plan of Care (Signed)
Nurse discussed coping skills with patient.  

## 2021-07-07 NOTE — Progress Notes (Signed)
Discharge Note:  Patient discharged him via [facility].  Suicide prevention information given and discussed with patient who stated he understood and had no questions.  Patient denied SI and HI.  Denied A/V hallucinations.  Safety maintained with 15 minute checks. ? ?

## 2021-07-07 NOTE — Group Note (Signed)
Recreation Therapy Group Note ? ? ?Group Topic:Leisure Education  ?Group Date: 07/07/2021 ?Start Time: 1009 ?End Time: 1048 ?Facilitators: Caroll Rancher, LRT,CTRS ?Location: [address] Dayroom ? ? ?Goal Area(s) Addresses:  ?Patient will successfully identify positive leisure and recreation activities.  ?Patient will acknowledge benefits of participation in healthy leisure activities post discharge.  ?Patient will actively work with peers toward a shared goal. ?  ?Group Description: Pictionary. Patients took turns trying to guess the picture being drawn on the board by their peer.  If one of the peers guessed the correct answer, they got the next turn.  If no one guessed the correct answer, LRT would draw the next picture.  Post-activity discussion reviewed benefits of positive recreation outlets: reducing stress, improving coping mechanisms, increasing self-esteem, and building larger support systems. ?  ? ?Affect/Mood: Appropriate ?  ?Participation Level: Engaged ?  ?Participation Quality: Independent ?  ?Behavior: Appropriate and Calm ?  ?Speech/Thought Process: Relevant ?  ?Insight: Moderate ?  ?Judgement: Moderate ?  ?Modes of Intervention: Competitive Play ?  ?Patient Response to Interventions:  Engaged ?  ?Education Outcome: ? Acknowledges education and In group clarification offered   ? ?Clinical Observations/Individualized Feedback: Pt described leisure as getting rest.  Pt was attentive and engaged in activity.  Pt worked well with peers in trying to guess the drawings of peers.  Pt was pleasant during group session.   ? ? ?Plan: Continue to engage patient in RT group sessions 2-3x/week. ? ? ?Caroll Rancher, LRT,CTRS ?07/07/2021 11:59 AM ?

## 2021-07-07 NOTE — Progress Notes (Signed)
?  [facility] Adult Case Management Discharge Plan : ? ?Will you be returning to the same living situation after discharge:  Yes,  home with father ?At discharge, do you have transportation home?: No. Taxi Voucher to be used ?Do you have the ability to pay for your medications: Yes,  has insurance ? ?Release of information consent forms completed and in the chart;  Patient's signature needed at discharge. ? ?Patient to Follow up at: ? Follow-up Information   ? ? [facility] Follow up on 07/12/2021.   ?Why: You have a hospital follow up appointment for therapy and medication management services on 07/12/21 at 10:30 am.  This will be a Virtual telehealth appointment. ?Contact information: ?[address]  ?[address] ?[place] [address] ?[phone] ? ? ?  ?  ? ?  ?  ? ?  ? ? ?Next level of care provider has access to [facility] Link:no ? ?Safety Planning and Suicide Prevention discussed: Yes,  with father ? ?  ? ?Has patient been referred to the Quitline?: N/A patient is not a smoker ? ?Patient has been referred for addiction treatment: N/A ? ?Otelia Santee, LCSW ?07/07/2021, 1:44 PM ?

## 2021-07-07 NOTE — Progress Notes (Signed)
Recreation Therapy Notes ? ?INPATIENT RECREATION TR PLAN ? ?Patient Details ?Name: Mark [name] ?MRN: [id number] ?DOB: [birth year] ?Today's Date: 07/07/2021 ? ?Rec Therapy Plan ?Is patient appropriate for Therapeutic Recreation?: Yes ?Treatment times per week: about 3 days ?Estimated Length of Stay: 5-7 days ?TR Treatment/Interventions: Group participation (Comment) ? ?Discharge Criteria ?Pt will be discharged from therapy if:: Discharged ?Treatment plan/goals/alternatives discussed and agreed upon by:: Patient/family ? ?Discharge Summary ?Short term goals set: See patient care plan ?Short term goals met: Adequate for discharge ?Progress toward goals comments: Groups attended ?Which groups?: Leisure education, Self-esteem ?Reason goals not met: Pt fully participated in one recreation therapy group session. ?Therapeutic equipment acquired: N/A ?Reason patient discharged from therapy: Discharge from hospital ?Pt/family agrees with progress & goals achieved: Yes ?Date patient discharged from therapy: 07/07/21 ? ? ? ?Victorino Sparrow, LRT,CTRS ?Victorino Sparrow A ?07/07/2021, 12:36 PM ?

## 2021-07-07 NOTE — Progress Notes (Signed)
Patient denied SI and HI, contracts for safety.  Denied A/V hallucinations.  ?Respirations even and unlabored.  Safety maintained with 15 minute checks. ? ?

## 2021-07-07 NOTE — Group Note (Signed)
LCSW Group Therapy Note ? ?07/07/2021 at 1:00pm ? ?Type of Therapy and Topic:  Group Therapy:  Setting Goals ? ?Participation Level:  Active ? ?Description of Group: ?In this process group, patients discussed using strengths to work toward goals and address challenges.  Patients identified two positive things about themselves and one goal they were working on.  Patients were given the opportunity to share openly and support each other?s plan for self-empowerment.  The group discussed the value of gratitude and were encouraged to have a daily reflection of positive characteristics or circumstances.  Patients were encouraged to identify a plan to utilize their strengths to work on current challenges and goals. ? ?Therapeutic Goals ?Patient will verbalize personal strengths/positive qualities and relate how these can assist with achieving desired personal goals ?Patients will verbalize affirmation of peers plans for personal change and goal setting ?Patients will explore the value of gratitude and positive focus as related to successful achievement of goals ?Patients will verbalize a plan for regular reinforcement of personal positive qualities and circumstances. ? ?Summary of Patient Progress:  Dustine attended group and offered some participation. He shared that his goal for the week was to sleep better. Her states barriers to this is drinking caffeine. He reports his medications help him to sleep better. Patient was disruptive during group due to laughing loudly to himself and distracting other group members.  ? ? ? ?Therapeutic Modalities ?Cognitive Behavioral Therapy ?Motivational Interviewing ? ? ?Ruthann Cancer MSW, LCSW ?Clincal Social Worker  ?[facility]  ?

## 2021-11-24 ENCOUNTER — Inpatient Hospital Stay (HOSPITAL_COMMUNITY)
Admission: EM | Admit: 2021-11-24 | Discharge: 2021-11-28 | DRG: 071 | Disposition: A | Discharge status: Home / Self Care | Payer: Medicaid Other | Attending: Family Medicine | Admitting: Family Medicine

## 2021-11-24 ENCOUNTER — Other Ambulatory Visit: Payer: Self-pay

## 2021-11-24 ENCOUNTER — Observation Stay (HOSPITAL_COMMUNITY): Payer: Medicaid Other

## 2021-11-24 DIAGNOSIS — D72828 Other elevated white blood cell count: Secondary | ICD-10-CM | POA: Diagnosis present

## 2021-11-24 DIAGNOSIS — N289 Disorder of kidney and ureter, unspecified: Secondary | ICD-10-CM

## 2021-11-24 DIAGNOSIS — F39 Unspecified mood [affective] disorder: Secondary | ICD-10-CM | POA: Diagnosis present

## 2021-11-24 DIAGNOSIS — N179 Acute kidney failure, unspecified: Secondary | ICD-10-CM

## 2021-11-24 DIAGNOSIS — R778 Other specified abnormalities of plasma proteins: Secondary | ICD-10-CM

## 2021-11-24 DIAGNOSIS — I248 Other forms of acute ischemic heart disease: Secondary | ICD-10-CM | POA: Diagnosis present

## 2021-11-24 DIAGNOSIS — R4586 Emotional lability: Secondary | ICD-10-CM

## 2021-11-24 DIAGNOSIS — E870 Hyperosmolality and hypernatremia: Secondary | ICD-10-CM

## 2021-11-24 DIAGNOSIS — E872 Acidosis, unspecified: Secondary | ICD-10-CM

## 2021-11-24 DIAGNOSIS — R7989 Other specified abnormal findings of blood chemistry: Secondary | ICD-10-CM

## 2021-11-24 DIAGNOSIS — Z79899 Other long term (current) drug therapy: Secondary | ICD-10-CM

## 2021-11-24 DIAGNOSIS — Z9183 Wandering in diseases classified elsewhere: Secondary | ICD-10-CM

## 2021-11-24 DIAGNOSIS — R4182 Altered mental status, unspecified: Principal | ICD-10-CM

## 2021-11-24 DIAGNOSIS — R410 Disorientation, unspecified: Secondary | ICD-10-CM | POA: Diagnosis not present

## 2021-11-24 DIAGNOSIS — I10 Essential (primary) hypertension: Secondary | ICD-10-CM

## 2021-11-24 DIAGNOSIS — F209 Schizophrenia, unspecified: Secondary | ICD-10-CM

## 2021-11-24 DIAGNOSIS — Z638 Other specified problems related to primary support group: Secondary | ICD-10-CM

## 2021-11-24 DIAGNOSIS — G9340 Encephalopathy, unspecified: Principal | ICD-10-CM | POA: Diagnosis present

## 2021-11-24 DIAGNOSIS — D72829 Elevated white blood cell count, unspecified: Secondary | ICD-10-CM

## 2021-11-24 DIAGNOSIS — G250 Essential tremor: Secondary | ICD-10-CM | POA: Diagnosis present

## 2021-11-24 LAB — CBC WITH DIFFERENTIAL/PLATELET
Abs Immature Granulocytes: 0.04 10*3/uL (ref 0.00–0.07)
Basophils Absolute: 0 10*3/uL (ref 0.0–0.1)
Basophils Relative: 0 %
Eosinophils Absolute: 0 10*3/uL (ref 0.0–0.5)
Eosinophils Relative: 0 %
HCT: 53 % — ABNORMAL HIGH (ref 39.0–52.0)
Hemoglobin: 16.7 g/dL (ref 13.0–17.0)
Immature Granulocytes: 0 %
Lymphocytes Relative: 10 %
Lymphs Abs: 1.2 10*3/uL (ref 0.7–4.0)
MCH: 28.6 pg (ref 26.0–34.0)
MCHC: 31.5 g/dL (ref 30.0–36.0)
MCV: 90.9 fL (ref 80.0–100.0)
Monocytes Absolute: 1 10*3/uL (ref 0.1–1.0)
Monocytes Relative: 8 %
Neutro Abs: 9.9 10*3/uL — ABNORMAL HIGH (ref 1.7–7.7)
Neutrophils Relative %: 82 %
Platelets: 342 10*3/uL (ref 150–400)
RBC: 5.83 MIL/uL — ABNORMAL HIGH (ref 4.22–5.81)
RDW: 14.2 % (ref 11.5–15.5)
WBC: 12 10*3/uL — ABNORMAL HIGH (ref 4.0–10.5)
nRBC: 0 % (ref 0.0–0.2)

## 2021-11-24 LAB — SALICYLATE LEVEL: Salicylate Lvl: <7 mg/dL — ABNORMAL LOW (ref 7.0–30.0)

## 2021-11-24 LAB — URINALYSIS, ROUTINE W REFLEX MICROSCOPIC
Bacteria, UA: NONE SEEN
Bilirubin Urine: NEGATIVE
Glucose, UA: NEGATIVE mg/dL
Ketones, ur: 20 mg/dL — AB
Leukocytes,Ua: NEGATIVE
Nitrite: NEGATIVE
Protein, ur: 100 mg/dL — AB
Specific Gravity, Urine: 1.026 (ref 1.005–1.030)
pH: 5 (ref 5.0–8.0)

## 2021-11-24 LAB — ETHANOL: Alcohol, Ethyl (B): <10 mg/dL (ref <10)

## 2021-11-24 LAB — TROPONIN I (HIGH SENSITIVITY)
Troponin I (High Sensitivity): 27 ng/L — ABNORMAL HIGH (ref <18)
Troponin I (High Sensitivity): 27 ng/L — ABNORMAL HIGH (ref <18)

## 2021-11-24 LAB — RAPID URINE DRUG SCREEN, HOSP PERFORMED
Amphetamines: NOT DETECTED
Barbiturates: NOT DETECTED
Benzodiazepines: NOT DETECTED
Cocaine: NOT DETECTED
Opiates: NOT DETECTED
Tetrahydrocannabinol: NOT DETECTED

## 2021-11-24 LAB — ACETAMINOPHEN LEVEL: Acetaminophen (Tylenol), Serum: <10 ug/mL — ABNORMAL LOW (ref 10–30)

## 2021-11-24 MED ORDER — ACETAMINOPHEN 325 MG PO TABS: 650.0000 mg | ORAL_TABLET | Freq: Four times a day (QID) | ORAL | Status: DC | PRN

## 2021-11-24 MED ORDER — ADULT MULTIVITAMIN W/MINERALS CH
1.0000 | ORAL_TABLET | Freq: Every day | ORAL | Status: DC
  Administered 2021-11-24 – 2021-11-28 (×4): 1 via ORAL
  Filled 2021-11-24 (×5): qty 1

## 2021-11-24 MED ORDER — ONDANSETRON HCL 4 MG/2ML IJ SOLN
4.0000 mg | Freq: Four times a day (QID) | INTRAMUSCULAR | Status: DC | PRN
  Administered 2021-11-24: 4 mg via INTRAVENOUS
  Filled 2021-11-24: qty 2

## 2021-11-24 MED ORDER — METOPROLOL TARTRATE 25 MG PO TABS
25.0000 mg | ORAL_TABLET | Freq: Two times a day (BID) | ORAL | Status: DC
Start: 2021-11-24 — End: 2021-11-24

## 2021-11-24 MED ORDER — THIAMINE HCL 100 MG/ML IJ SOLN
100.0000 mg | Freq: Every day | INTRAMUSCULAR | Status: DC
Start: 2021-11-24 — End: 2021-11-28
  Filled 2021-11-24 (×2): qty 2

## 2021-11-24 MED ORDER — LACTATED RINGERS IV SOLN: INTRAVENOUS | Status: AC

## 2021-11-24 MED ORDER — LORAZEPAM 2 MG/ML IJ SOLN
1.0000 mg | INTRAMUSCULAR | Status: DC | PRN
  Administered 2021-11-25: 1 mg via INTRAVENOUS
  Administered 2021-11-26: 3 mg via INTRAVENOUS
  Filled 2021-11-24: qty 1
  Filled 2021-11-24: qty 2

## 2021-11-24 MED ORDER — LORAZEPAM 1 MG PO TABS: 1.0000 mg | ORAL_TABLET | ORAL | Status: DC | PRN

## 2021-11-24 MED ORDER — FOLIC ACID 1 MG PO TABS
1.0000 mg | ORAL_TABLET | Freq: Every day | ORAL | Status: DC
Start: 2021-11-24 — End: 2021-11-28
  Administered 2021-11-24 – 2021-11-28 (×5): 1 mg via ORAL
  Filled 2021-11-24 (×5): qty 1

## 2021-11-24 MED ORDER — ONDANSETRON HCL 4 MG PO TABS: 4.0000 mg | ORAL_TABLET | Freq: Four times a day (QID) | ORAL | Status: DC | PRN

## 2021-11-24 MED ORDER — METOPROLOL TARTRATE 25 MG PO TABS
25.0000 mg | ORAL_TABLET | Freq: Two times a day (BID) | ORAL | Status: DC
  Administered 2021-11-24: 25 mg via ORAL
  Filled 2021-11-24: qty 1

## 2021-11-24 MED ORDER — METOPROLOL TARTRATE 50 MG PO TABS
50.0000 mg | ORAL_TABLET | Freq: Two times a day (BID) | ORAL | Status: DC
  Administered 2021-11-24 – 2021-11-28 (×8): 50 mg via ORAL
  Filled 2021-11-24 (×8): qty 1

## 2021-11-24 MED ORDER — THIAMINE HCL 100 MG PO TABS: 100.0000 mg | ORAL_TABLET | Freq: Every day | ORAL | Status: DC

## 2021-11-24 MED ORDER — LACTATED RINGERS IV BOLUS
1000.0000 mL | Freq: Once | INTRAVENOUS | Status: AC
  Administered 2021-11-24: 1000 mL via INTRAVENOUS

## 2021-11-24 MED ORDER — THIAMINE HCL 100 MG PO TABS
100.0000 mg | ORAL_TABLET | Freq: Every day | ORAL | Status: DC
  Administered 2021-11-24 – 2021-11-28 (×4): 100 mg via ORAL
  Filled 2021-11-24 (×5): qty 1

## 2021-11-24 MED ORDER — ACETAMINOPHEN 650 MG RE SUPP: 650.0000 mg | Freq: Four times a day (QID) | RECTAL | Status: DC | PRN

## 2021-11-24 MED ORDER — HYDRALAZINE HCL 20 MG/ML IJ SOLN
10.0000 mg | Freq: Three times a day (TID) | INTRAMUSCULAR | Status: DC | PRN
  Administered 2021-11-25: 10 mg via INTRAVENOUS
  Filled 2021-11-24: qty 1

## 2021-11-25 ENCOUNTER — Inpatient Hospital Stay (HOSPITAL_COMMUNITY): Payer: Medicaid Other

## 2021-11-25 DIAGNOSIS — F39 Unspecified mood [affective] disorder: Secondary | ICD-10-CM | POA: Diagnosis present

## 2021-11-25 DIAGNOSIS — F209 Schizophrenia, unspecified: Secondary | ICD-10-CM | POA: Diagnosis present

## 2021-11-25 DIAGNOSIS — Z79899 Other long term (current) drug therapy: Secondary | ICD-10-CM | POA: Diagnosis not present

## 2021-11-25 DIAGNOSIS — Z638 Other specified problems related to primary support group: Secondary | ICD-10-CM | POA: Diagnosis not present

## 2021-11-25 DIAGNOSIS — G9340 Encephalopathy, unspecified: Secondary | ICD-10-CM | POA: Diagnosis present

## 2021-11-25 DIAGNOSIS — G250 Essential tremor: Secondary | ICD-10-CM | POA: Diagnosis present

## 2021-11-25 DIAGNOSIS — E872 Acidosis, unspecified: Secondary | ICD-10-CM | POA: Diagnosis present

## 2021-11-25 DIAGNOSIS — N179 Acute kidney failure, unspecified: Secondary | ICD-10-CM | POA: Diagnosis present

## 2021-11-25 DIAGNOSIS — I10 Essential (primary) hypertension: Secondary | ICD-10-CM | POA: Diagnosis present

## 2021-11-25 DIAGNOSIS — Z9183 Wandering in diseases classified elsewhere: Secondary | ICD-10-CM | POA: Diagnosis not present

## 2021-11-25 DIAGNOSIS — I248 Other forms of acute ischemic heart disease: Secondary | ICD-10-CM | POA: Diagnosis present

## 2021-11-25 DIAGNOSIS — E870 Hyperosmolality and hypernatremia: Secondary | ICD-10-CM | POA: Diagnosis present

## 2021-11-25 DIAGNOSIS — D72828 Other elevated white blood cell count: Secondary | ICD-10-CM | POA: Diagnosis present

## 2021-11-25 DIAGNOSIS — R410 Disorientation, unspecified: Secondary | ICD-10-CM | POA: Diagnosis not present

## 2021-11-25 DIAGNOSIS — R4182 Altered mental status, unspecified: Secondary | ICD-10-CM | POA: Diagnosis present

## 2021-11-25 LAB — CBC
HCT: 50.6 % (ref 39.0–52.0)
Hemoglobin: 16.3 g/dL (ref 13.0–17.0)
MCH: 29.4 pg (ref 26.0–34.0)
MCHC: 32.2 g/dL (ref 30.0–36.0)
MCV: 91.2 fL (ref 80.0–100.0)
Platelets: 294 10*3/uL (ref 150–400)
RBC: 5.55 MIL/uL (ref 4.22–5.81)
RDW: 13.7 % (ref 11.5–15.5)
WBC: 13.6 10*3/uL — ABNORMAL HIGH (ref 4.0–10.5)
nRBC: 0 % (ref 0.0–0.2)

## 2021-11-25 MED ORDER — LORAZEPAM 2 MG/ML IJ SOLN
2.0000 mg | Freq: Once | INTRAMUSCULAR | Status: AC | PRN
Start: 2021-11-25 — End: 2021-11-25
  Administered 2021-11-25: 2 mg via INTRAVENOUS
  Filled 2021-11-25: qty 1

## 2021-11-25 MED ORDER — HYDRALAZINE HCL 20 MG/ML IJ SOLN
10.0000 mg | INTRAMUSCULAR | Status: DC | PRN
Start: 2021-11-25 — End: 2021-11-28

## 2021-11-25 MED ORDER — CAMPHOR-MENTHOL 0.5-0.5 % EX LOTN: TOPICAL_LOTION | CUTANEOUS | Status: DC | PRN

## 2021-11-25 MED ORDER — AMLODIPINE BESYLATE 5 MG PO TABS
5.0000 mg | ORAL_TABLET | Freq: Every day | ORAL | Status: DC
  Administered 2021-11-25 – 2021-11-28 (×4): 5 mg via ORAL
  Filled 2021-11-25 (×4): qty 1

## 2021-11-26 DIAGNOSIS — F209 Schizophrenia, unspecified: Secondary | ICD-10-CM

## 2021-11-26 DIAGNOSIS — R4586 Emotional lability: Secondary | ICD-10-CM

## 2021-11-26 LAB — CBC WITH DIFFERENTIAL/PLATELET
Abs Immature Granulocytes: 0.02 10*3/uL (ref 0.00–0.07)
Basophils Absolute: 0 10*3/uL (ref 0.0–0.1)
Basophils Relative: 0 %
Eosinophils Absolute: 0.1 10*3/uL (ref 0.0–0.5)
Eosinophils Relative: 1 %
HCT: 48.8 % (ref 39.0–52.0)
Hemoglobin: 15.8 g/dL (ref 13.0–17.0)
Immature Granulocytes: 0 %
Lymphocytes Relative: 28 %
Lymphs Abs: 3.2 10*3/uL (ref 0.7–4.0)
MCH: 29.3 pg (ref 26.0–34.0)
MCHC: 32.4 g/dL (ref 30.0–36.0)
MCV: 90.5 fL (ref 80.0–100.0)
Monocytes Absolute: 0.9 10*3/uL (ref 0.1–1.0)
Monocytes Relative: 8 %
Neutro Abs: 7.1 10*3/uL (ref 1.7–7.7)
Neutrophils Relative %: 63 %
Platelets: 260 10*3/uL (ref 150–400)
RBC: 5.39 MIL/uL (ref 4.22–5.81)
RDW: 13.8 % (ref 11.5–15.5)
WBC: 11.5 10*3/uL — ABNORMAL HIGH (ref 4.0–10.5)
nRBC: 0 % (ref 0.0–0.2)

## 2021-11-26 MED ORDER — BENZTROPINE MESYLATE 0.5 MG PO TABS
1.0000 mg | ORAL_TABLET | Freq: Two times a day (BID) | ORAL | Status: DC
  Administered 2021-11-26 – 2021-11-28 (×5): 1 mg via ORAL
  Filled 2021-11-26 (×5): qty 2

## 2021-11-26 MED ORDER — HALOPERIDOL LACTATE 5 MG/ML IJ SOLN
5.0000 mg | Freq: Two times a day (BID) | INTRAMUSCULAR | Status: DC
Start: 2021-11-26 — End: 2021-11-28
  Administered 2021-11-26 – 2021-11-28 (×5): 5 mg via INTRAVENOUS
  Filled 2021-11-26 (×5): qty 1

## 2021-11-26 MED ORDER — DIVALPROEX SODIUM 250 MG PO DR TAB
500.0000 mg | DELAYED_RELEASE_TABLET | Freq: Two times a day (BID) | ORAL | Status: DC
  Administered 2021-11-26 – 2021-11-28 (×5): 500 mg via ORAL
  Filled 2021-11-26 (×5): qty 2

## 2021-11-26 MED ORDER — GABAPENTIN 400 MG PO CAPS
400.0000 mg | ORAL_CAPSULE | Freq: Three times a day (TID) | ORAL | Status: DC
  Administered 2021-11-26 – 2021-11-28 (×6): 400 mg via ORAL
  Filled 2021-11-26 (×6): qty 1

## 2021-11-26 MED ORDER — PROPRANOLOL HCL 10 MG PO TABS
10.0000 mg | ORAL_TABLET | Freq: Every day | ORAL | Status: DC
  Administered 2021-11-26 – 2021-11-28 (×3): 10 mg via ORAL
  Filled 2021-11-26 (×3): qty 1

## 2021-11-26 MED ORDER — HALOPERIDOL LACTATE 5 MG/ML IJ SOLN: 5.0000 mg | Freq: Four times a day (QID) | INTRAMUSCULAR | Status: DC | PRN

## 2021-11-27 DIAGNOSIS — N179 Acute kidney failure, unspecified: Secondary | ICD-10-CM

## 2021-11-27 DIAGNOSIS — G9340 Encephalopathy, unspecified: Secondary | ICD-10-CM | POA: Diagnosis not present

## 2021-11-27 DIAGNOSIS — I10 Essential (primary) hypertension: Secondary | ICD-10-CM | POA: Diagnosis not present

## 2021-11-27 MED ORDER — ZOLPIDEM TARTRATE 5 MG PO TABS
5.0000 mg | ORAL_TABLET | Freq: Every evening | ORAL | Status: DC | PRN
  Administered 2021-11-27: 5 mg via ORAL
  Filled 2021-11-27: qty 1

## 2021-11-28 DIAGNOSIS — E870 Hyperosmolality and hypernatremia: Secondary | ICD-10-CM | POA: Diagnosis not present

## 2021-11-28 DIAGNOSIS — I10 Essential (primary) hypertension: Secondary | ICD-10-CM | POA: Diagnosis not present

## 2021-11-28 DIAGNOSIS — E872 Acidosis, unspecified: Secondary | ICD-10-CM

## 2021-11-28 DIAGNOSIS — N179 Acute kidney failure, unspecified: Secondary | ICD-10-CM | POA: Diagnosis not present

## 2021-11-28 DIAGNOSIS — G9340 Encephalopathy, unspecified: Secondary | ICD-10-CM | POA: Diagnosis not present

## 2021-11-28 LAB — COMPREHENSIVE METABOLIC PANEL
ALT: 19 U/L (ref 0–44)
ALT: 21 U/L (ref 0–44)
ALT: 21 U/L (ref 0–44)
AST: 37 U/L (ref 15–41)
AST: 50 U/L — ABNORMAL HIGH (ref 15–41)
AST: 35 U/L (ref 15–41)
Albumin: 5 g/dL (ref 3.5–5.0)
Albumin: 4.1 g/dL (ref 3.5–5.0)
Albumin: 3.6 g/dL (ref 3.5–5.0)
Alkaline Phosphatase: 89 U/L (ref 38–126)
Alkaline Phosphatase: 73 U/L (ref 38–126)
Alkaline Phosphatase: 68 U/L (ref 38–126)
Anion gap: 17 — ABNORMAL HIGH (ref 5–15)
Anion gap: 8 (ref 5–15)
Anion gap: 8 (ref 5–15)
BUN: 22 mg/dL — ABNORMAL HIGH (ref 6–20)
BUN: 18 mg/dL (ref 6–20)
BUN: 32 mg/dL — ABNORMAL HIGH (ref 6–20)
CO2: 21 mmol/L — ABNORMAL LOW (ref 22–32)
CO2: 25 mmol/L (ref 22–32)
CO2: 28 mmol/L (ref 22–32)
Calcium: 11.8 mg/dL — ABNORMAL HIGH (ref 8.9–10.3)
Calcium: 10.9 mg/dL — ABNORMAL HIGH (ref 8.9–10.3)
Calcium: 10.8 mg/dL — ABNORMAL HIGH (ref 8.9–10.3)
Chloride: 112 mmol/L — ABNORMAL HIGH (ref 98–111)
Chloride: 113 mmol/L — ABNORMAL HIGH (ref 98–111)
Chloride: 108 mmol/L (ref 98–111)
Creatinine, Ser: 1.76 mg/dL — ABNORMAL HIGH (ref 0.61–1.24)
Creatinine, Ser: 1.04 mg/dL (ref 0.61–1.24)
Creatinine, Ser: 1.21 mg/dL (ref 0.61–1.24)
GFR, Estimated: 26 mL/min — ABNORMAL LOW (ref >60)
GFR, Estimated: 48 mL/min — ABNORMAL LOW (ref >60)
GFR, Estimated: 40 mL/min — ABNORMAL LOW (ref >60)
Glucose, Bld: 103 mg/dL — ABNORMAL HIGH (ref 70–99)
Glucose, Bld: 103 mg/dL — ABNORMAL HIGH (ref 70–99)
Glucose, Bld: 107 mg/dL — ABNORMAL HIGH (ref 70–99)
Potassium: 4.3 mmol/L (ref 3.5–5.1)
Potassium: 3.9 mmol/L (ref 3.5–5.1)
Potassium: 3.5 mmol/L (ref 3.5–5.1)
Sodium: 150 mmol/L — ABNORMAL HIGH (ref 135–145)
Sodium: 146 mmol/L — ABNORMAL HIGH (ref 135–145)
Sodium: 144 mmol/L (ref 135–145)
Total Bilirubin: 1.2 mg/dL (ref 0.3–1.2)
Total Bilirubin: 1.3 mg/dL — ABNORMAL HIGH (ref 0.3–1.2)
Total Bilirubin: 0.9 mg/dL (ref 0.3–1.2)
Total Protein: 9 g/dL — ABNORMAL HIGH (ref 6.5–8.1)
Total Protein: 7.7 g/dL (ref 6.5–8.1)
Total Protein: 7.1 g/dL (ref 6.5–8.1)

## 2021-11-28 LAB — BASIC METABOLIC PANEL
Anion gap: 10 (ref 5–15)
BUN: 21 mg/dL — ABNORMAL HIGH (ref 6–20)
CO2: 24 mmol/L (ref 22–32)
Calcium: 10.3 mg/dL (ref 8.9–10.3)
Chloride: 106 mmol/L (ref 98–111)
Creatinine, Ser: 1.03 mg/dL (ref 0.61–1.24)
GFR, Estimated: >60 mL/min (ref >60)
Glucose, Bld: 190 mg/dL — ABNORMAL HIGH (ref 70–99)
Potassium: 3.7 mmol/L (ref 3.5–5.1)
Sodium: 140 mmol/L (ref 135–145)

## 2021-11-28 LAB — RENAL FUNCTION PANEL
Albumin: 4.4 g/dL (ref 3.5–5.0)
Anion gap: 11 (ref 5–15)
BUN: 18 mg/dL (ref 6–20)
CO2: 26 mmol/L (ref 22–32)
Calcium: 10.8 mg/dL — ABNORMAL HIGH (ref 8.9–10.3)
Chloride: 107 mmol/L (ref 98–111)
Creatinine, Ser: 1.1 mg/dL (ref 0.61–1.24)
GFR, Estimated: 45 mL/min — ABNORMAL LOW (ref >60)
Glucose, Bld: 103 mg/dL — ABNORMAL HIGH (ref 70–99)
Phosphorus: 2.5 mg/dL (ref 2.5–4.6)
Potassium: 3.6 mmol/L (ref 3.5–5.1)
Sodium: 144 mmol/L (ref 135–145)

## 2021-11-28 LAB — CBC WITH DIFFERENTIAL/PLATELET
Abs Immature Granulocytes: 0.02 10*3/uL (ref 0.00–0.07)
Basophils Absolute: 0 10*3/uL (ref 0.0–0.1)
Basophils Relative: 1 %
Eosinophils Absolute: 0.2 10*3/uL (ref 0.0–0.5)
Eosinophils Relative: 2 %
HCT: 49.1 % (ref 39.0–52.0)
Hemoglobin: 15.7 g/dL (ref 13.0–17.0)
Immature Granulocytes: 0 %
Lymphocytes Relative: 34 %
Lymphs Abs: 2.6 10*3/uL (ref 0.7–4.0)
MCH: 28.5 pg (ref 26.0–34.0)
MCHC: 32 g/dL (ref 30.0–36.0)
MCV: 89.1 fL (ref 80.0–100.0)
Monocytes Absolute: 0.5 10*3/uL (ref 0.1–1.0)
Monocytes Relative: 7 %
Neutro Abs: 4.4 10*3/uL (ref 1.7–7.7)
Neutrophils Relative %: 56 %
Platelets: 218 10*3/uL (ref 150–400)
RBC: 5.51 MIL/uL (ref 4.22–5.81)
RDW: 12.5 % (ref 11.5–15.5)
WBC: 7.8 10*3/uL (ref 4.0–10.5)
nRBC: 0 % (ref 0.0–0.2)

## 2021-11-28 MED ORDER — AMLODIPINE BESYLATE 5 MG PO TABS: 5.0000 mg | ORAL_TABLET | Freq: Every day | ORAL | 0 refills | Status: DC

## 2021-11-28 MED ORDER — METOPROLOL TARTRATE 50 MG PO TABS: 50.0000 mg | ORAL_TABLET | Freq: Two times a day (BID) | ORAL | 0 refills | Status: DC

## 2021-11-28 MED ORDER — BENZTROPINE MESYLATE 1 MG PO TABS: 1.0000 mg | ORAL_TABLET | Freq: Two times a day (BID) | ORAL | 0 refills | Status: DC

## 2021-11-28 NOTE — TOC Transition Note (Addendum)
Transition of Care [facility]) - CM/SW Discharge Note   Patient Details  Name: Mark [name] MRN: [id number] Date of Birth: [birth year]  Transition of Care [facility]) CM/SW Contact:  Darleene Cleaver, LCSW Phone Number: 11/28/2021, 10:03 AM   Clinical Narrative:     CSW received consult for patient needing substance abuse resources.  Substance abuse resources added to AVS, also patient does not have a PCP, information for clinic added to AVS.  CSW was informed that patient needs a cab ride back home.  CSW provided cab voucher to his father's address which is [address]  [place], [place], [address].  CSW provided cab voucher to bedside nurse and provided phone number to call Maretta Los [phone] when he is ready for discharge.  1:15pm  CSW was informed that patient's father arrived and will be transporting patient back home.   Final next level of care: Home/Self Care Barriers to Discharge: Barriers Resolved   Patient Goals and CMS Choice Patient states their goals for this hospitalization and ongoing recovery are:: To return back home with father. CMS Medicare.gov Compare Post Acute Care list provided to:: Patient Choice offered to / list presented to : Patient  Discharge Placement                       Discharge Plan and Services                                     Social Determinants of Health (SDOH) Interventions     Readmission Risk Interventions     No data to display

## 2021-11-28 NOTE — Discharge Summary (Signed)
PatientPhysician Discharge Summary  Perri Lamagna TGG:[id number] DOB: 1960/12/28 DOA: 11/24/2021  PCP: No primary care provider on file.  Admit date: 11/24/2021 Discharge date: 11/28/2021 30 Day Unplanned Readmission Risk Score    Flowsheet Row ED to Hosp-Admission (Current) from 11/24/2021 in [facility] 4TH FLOOR PROGRESSIVE CARE AND UROLOGY  30 Day Unplanned Readmission Risk Score (%) 14.63 Filed at 11/28/2021 0801       This score is the patient's risk of an unplanned readmission within 30 days of being discharged (0 -100%). The score is based on dignosis, age, lab data, medications, orders, and past utilization.   Low:  0-14.9   Medium: 15-21.9   High: 22-29.9   Extreme: 30 and above          Admitted From: Home/Street Disposition: Home  Recommendations for Outpatient Follow-up:  Follow up with PCP in 1-2 weeks Please obtain BMP/CBC in one week Follow-up with psychiatrist in 1 month Please follow up with your PCP on the following pending results: Unresulted Labs (From admission, onward)     Start     Ordered   11/24/21 1638  PTH, intact and calcium  Once,   R        11/24/21 1637              Home Health: None Equipment/Devices: None  Discharge Condition: Stable  CODE STATUS: Full code Diet recommendation: Cardiac  Subjective: Seen and examined.  He is alert and partly oriented but at his baseline with dysarthria.  He is much improved from when he came in.  Brief/Interim Summary: Maximino Cozzolino is a 61 year old gentleman who was initially admitted as Shiloh Doe V, a 61 y.o. male with unknown medical history. Presented with confusion. He was found wandering the streets while naked.  He was confused and was unable to provide any history.  Patient was encephalopathy, CT head was negative.  He was admitted under hospitalist service due to acute encephalopathy of unknown etiology.  Patient did have dysarthria and initially it was not known whether this was acute or  chronic and for that reason, MRI brain was obtained which was unremarkable as well.  In the meantime, with the help of [facility] and [employer], Patient was finally identified by police to fingerprinting as Mr. Olliver Boyadjian with date of birth 09/03/1960.  Over the course of last 2 to 3 days, patient has improved, he is fully alert and partly oriented, this is his baseline, I did verify from his father, his dysarthria is also old.  I was able to locate a discharge summary from psychiatry done on 07/07/2021 and during that time, he was hospitalized in psychiatry unit for 10 days and per those notes, he did have dysarthria.  He was discharged on several medications for schizophrenia as well as hypertension.  All those medications were resumed as well.  On 11/27/2021, I was also able to speak to his father  Sabino Denning [phone] today.  Interestingly, when I asked his father if he knew his son is in the hospital, he said no he did not but he did verify that his son lives with him.  He then told me that his son was in the jail and he went to jail on Tuesday, more interestingly, patient was brought in to the hospital on Wednesday by police it self but he was brought in from the street.  The father tells me that he was the one who called police on Tuesday to take him to the  jail because he was fighting with his father and was uncontrollable.  Nonetheless, after long discussion with his father, I did explain to him that patient appears to be back at his baseline and that he is medically stable for discharge.  The father was not comfortable taking him back at home on 11/27/2021, he was asking for few more days to provide him some time to think about it and perhaps make some arrangements.  I did explain to him that patient does not have any medical necessity to continue hospitalization however we are going to provide him 24 hours to make all the necessary arrangements that he needs and that he was  clearly informed that patient will be discharged home tomorrow and that he will need to come and pick him up.  He agreed with that. I did call him again today on 11/28/2021, he was at discharge but he was willing to pick up the patient after 11 AM.  I did verify with him and he informed me that patient has most of his medications at home.  I have prescribed him few of the medications.   Acute hypernatremia: Presented with sodium of 150, which has resolved now.   Hypercalcemia: Improved.   AKI: Resolved after IV fluids.   Metabolic acidosis: Resolved after IV fluid.   Elevated troponin: 27-27.  Flat with no acute ST-T wave elevations or changes on the EKG indicative of demand ischemia.   Essential hypertension: Blood pressure now controlled, continue amlodipine and metoprolol.  Apparently, he was discharged on amlodipine from psychiatry on 07/07/2021.  We are prescribing him both amlodipine and metoprolol.   Leukocytosis: Nonspecific, no fevers.  No indication of any infection.  Improving.   Schizophrenia with mood disorder: I was able to locate in his previous chart that he was hospitalized under psychiatry from 06/29/2021 through 07/07/2021 and he was discharged on gabapentin, Haldol, Depakote and benztropine.  Resumed all his home medications.   Essential tremors upper extremities: He was also on propranolol 10 mg p.o. daily, which I resumed.    Discharge plan was discussed with patient and/or family member and they verbalized understanding and agreed with it.  Discharge Diagnoses:  Active Problems:   Hypercalcemia   Hypernatremia   Elevated troponin   AKI (acute kidney injury) (HCC)   Metabolic acidosis   HTN (hypertension)   Leukocytosis   Acute encephalopathy   Schizophrenia (HCC)   Mood altered    Discharge Instructions   Allergies as of 11/28/2021   No Known Allergies      Medication List     TAKE these medications    amLODipine 5 MG tablet Commonly known as:  NORVASC Take 1 tablet (5 mg total) by mouth daily. Start taking on: November 29, 2021   aspirin EC 81 MG tablet Take 81 mg by mouth daily. Swallow whole.   benztropine 1 MG tablet Commonly known as: COGENTIN Take 1 tablet (1 mg total) by mouth 2 (two) times daily.   cyclobenzaprine 10 MG tablet Commonly known as: FLEXERIL Take 10 mg by mouth 3 (three) times daily as needed for muscle spasms.   divalproex 500 MG 24 hr tablet Commonly known as: DEPAKOTE ER Take by mouth.   ergocalciferol 1.25 MG (50000 UT) capsule Commonly known as: VITAMIN D2 Take 50,000 Units by mouth once a week.   gabapentin 400 MG capsule Commonly known as: NEURONTIN Take 400 mg by mouth 3 (three) times daily. What changed: Another medication with the same name was  removed. Continue taking this medication, and follow the directions you see here.   haloperidol 5 MG tablet Commonly known as: HALDOL Take 5 mg by mouth 2 (two) times daily.   metoprolol tartrate 50 MG tablet Commonly known as: LOPRESSOR Take 1 tablet (50 mg total) by mouth 2 (two) times daily.   omeprazole 20 MG capsule Commonly known as: PRILOSEC Take 20 mg by mouth daily.   propranolol 10 MG tablet Commonly known as: INDERAL Take 10 mg by mouth daily.   traZODone 50 MG tablet Commonly known as: DESYREL Take 50 mg by mouth at bedtime.        Follow-up Information     PCP Follow up in 1 week(s).          psychiatrist Follow up in 1 month(s).                 No Known Allergies  Consultations: None   Procedures/Studies: MR BRAIN WO CONTRAST  Result Date: 11/25/2021 CLINICAL DATA:  Stroke suspected.  Altered mental status. EXAM: MRI HEAD WITHOUT CONTRAST TECHNIQUE: Multiplanar, multiecho pulse sequences of the brain and surrounding structures were obtained without intravenous contrast. COMPARISON:  None Available. FINDINGS: Brain: No acute infarct, mass effect or extra-axial collection. No acute or chronic  hemorrhage. Normal white matter signal, parenchymal volume and CSF spaces. The midline structures are normal. Vascular: Major flow voids are preserved. Skull and upper cervical spine: Normal calvarium and skull base. Visualized upper cervical spine and soft tissues are normal. Sinuses/Orbits:No paranasal sinus fluid levels or advanced mucosal thickening. No mastoid or middle ear effusion. Normal orbits. IMPRESSION: Normal brain MRI. Electronically Signed   By: Deatra Robinson M.D.   On: 11/25/2021 22:09   DG CHEST PORT 1 VIEW  Result Date: 11/25/2021 CLINICAL DATA:  MRI clearance. EXAM: PORTABLE CHEST 1 VIEW COMPARISON:  None Available. FINDINGS: No implanted medical device or radiopaque foreign body.The cardiomediastinal contours are normal. The lungs are clear. Pulmonary vasculature is normal. No consolidation, pleural effusion, or pneumothorax. No acute osseous abnormalities are seen. IMPRESSION: 1. No implanted medical device or radiopaque foreign body to preclude MRI. 2. Clear lungs. Electronically Signed   By: Narda Rutherford M.D.   On: 11/25/2021 18:20   DG Pelvis 1-2 Views  Result Date: 11/25/2021 CLINICAL DATA:  MRI clearance. EXAM: PELVIS - 1-2 VIEW COMPARISON:  None Available. FINDINGS: There is a left iliac vascular stent in place. No other radiopaque foreign body or implanted medical device in the pelvis. No pelvic fracture. IMPRESSION: Left iliac vascular stent. Electronically Signed   By: Narda Rutherford M.D.   On: 11/25/2021 18:20   DG Abd 1 View  Result Date: 11/25/2021 CLINICAL DATA:  MRI clearance. EXAM: ABDOMEN - 1 VIEW COMPARISON:  None. FINDINGS: There is a left iliac vascular stent in place. No other implanted medical device. No visible radiopaque foreign body. Normal bowel gas pattern. IMPRESSION: Left iliac vascular stent. Electronically Signed   By: Narda Rutherford M.D.   On: 11/25/2021 18:19   CT HEAD WO CONTRAST ( )  Result Date: 11/24/2021 CLINICAL DATA:  Altered  mental status. EXAM: CT HEAD WITHOUT CONTRAST TECHNIQUE: Contiguous axial images were obtained from the base of the skull through the vertex without intravenous contrast. RADIATION DOSE REDUCTION: This exam was performed according to the departmental dose-optimization program which includes automated exposure control, adjustment of the mA and/or kV according to patient size and/or use of iterative reconstruction technique. COMPARISON:  None Available. FINDINGS: Brain: No evidence of  acute infarction, hemorrhage, hydrocephalus, extra-axial collection or mass lesion/mass effect. Vascular: No hyperdense vessel or unexpected calcification. Skull: Normal. Negative for fracture or focal lesion. Sinuses/Orbits: No acute finding. Other: None. IMPRESSION: No acute intracranial abnormality seen. Electronically Signed   By: Lupita Raider M.D.   On: 11/24/2021 09:17     Discharge Exam: Vitals:   11/27/21 2200 11/28/21 0546  BP: (!) 150/77 132/67  Pulse: 70 66  Resp:  17  Temp:  97.9 F (36.6 C)  SpO2:  98%   Vitals:   11/27/21 1448 11/27/21 2012 11/27/21 2200 11/28/21 0546  BP: 140/68 (!) 149/85 (!) 150/77 132/67  Pulse: 64 63 70 66  Resp: 20 19  17   Temp: 98 F (36.7 C) 98.2 F (36.8 C)  97.9 F (36.6 C)  TempSrc: Oral Oral  Oral  SpO2: 99% 100%  98%    General: Pt is alert, awake, not in acute distress Cardiovascular: RRR, S1/S2 +, no rubs, no gallops Respiratory: CTA bilaterally, no wheezing, no rhonchi Abdominal: Soft, NT, ND, bowel sounds + Extremities: no edema, no cyanosis    The results of significant diagnostics from this hospitalization (including imaging, microbiology, ancillary and laboratory) are listed below for reference.     Microbiology: No results found for this or any previous visit (from the past 240 hour(s)).   Labs: BNP (last 3 results) No results for input(s): "BNP" in the last 8760 hours. Basic Metabolic Panel: Recent Labs  Lab 11/24/21 0535 11/24/21 2015  11/25/21 0431 11/26/21 0401 11/28/21 0820  NA 150* 144 146* 144 140  K 4.3 3.6 3.9 3.5 3.7  CL 112* 107 113* 108 106  CO2 21* 26 25 28 24   GLUCOSE 103* 103* 103* 107* 190*  BUN 22* 18 18 32* 21*  CREATININE 1.76* 1.10 1.04 1.21 1.03  CALCIUM 11.8* 10.8* 10.9* 10.8* 10.3  PHOS  --  2.5  --   --   --    Liver Function Tests: Recent Labs  Lab 11/24/21 0535 11/24/21 2015 11/25/21 0431 11/26/21 0401  AST 37  --  50* 35  ALT 19  --  21 21  ALKPHOS 89  --  73 68  BILITOT 1.2  --  1.3* 0.9  PROT 9.0*  --  7.7 7.1  ALBUMIN 5.0 4.4 4.1 3.6   No results for input(s): "LIPASE", "AMYLASE" in the last 168 hours. No results for input(s): "AMMONIA" in the last 168 hours. CBC: Recent Labs  Lab 11/24/21 0535 11/25/21 0431 11/26/21 0401 11/28/21 0820  WBC 12.0* 13.6* 11.5* 7.8  NEUTROABS 9.9*  --  7.1 4.4  HGB 16.7 16.3 15.8 15.7  HCT 53.0* 50.6 48.8 49.1  MCV 90.9 91.2 90.5 89.1  PLT 342 294 260 218   Cardiac Enzymes: No results for input(s): "CKTOTAL", "CKMB", "CKMBINDEX", "TROPONINI" in the last 168 hours. BNP: Invalid input(s): "POCBNP" CBG: No results for input(s): "GLUCAP" in the last 168 hours. D-Dimer No results for input(s): "DDIMER" in the last 72 hours. Hgb A1c No results for input(s): "HGBA1C" in the last 72 hours. Lipid Profile No results for input(s): "CHOL", "HDL", "LDLCALC", "TRIG", "CHOLHDL", "LDLDIRECT" in the last 72 hours. Thyroid function studies No results for input(s): "TSH", "T4TOTAL", "T3FREE", "THYROIDAB" in the last 72 hours.  Invalid input(s): "FREET3" Anemia work up No results for input(s): "VITAMINB12", "FOLATE", "FERRITIN", "TIBC", "IRON", "RETICCTPCT" in the last 72 hours. Urinalysis    Component Value Date/Time   COLORURINE AMBER (A) 11/24/2021 0641   APPEARANCEUR HAZY (  A) 11/24/2021 0641   LABSPEC 1.026 11/24/2021 0641   PHURINE 5.0 11/24/2021 0641   GLUCOSEU NEGATIVE 11/24/2021 0641   HGBUR LARGE (A) 11/24/2021 0641   BILIRUBINUR  NEGATIVE 11/24/2021 0641   KETONESUR 20 (A) 11/24/2021 0641   PROTEINUR 100 (A) 11/24/2021 0641   NITRITE NEGATIVE 11/24/2021 0641   LEUKOCYTESUR NEGATIVE 11/24/2021 0641   Sepsis Labs Recent Labs  Lab 11/24/21 0535 11/25/21 0431 11/26/21 0401 11/28/21 0820  WBC 12.0* 13.6* 11.5* 7.8   Microbiology No results found for this or any previous visit (from the past 240 hour(s)).   Time coordinating discharge: Over 30 minutes  SIGNED:   Hughie Clossavi Kayleb Warshaw, MD  [facility] 11/28/2021, 9:35 AM *Please note that this is a verbal dictation therefore any spelling or grammatical errors are due to the "Dragon Medical One" system interpretation. If 7PM-7AM, please contact night-coverage [email or web address]

## 2021-11-28 NOTE — Progress Notes (Signed)
Patient discharged home with father.  Discharge instructions explained to father Johannes Everage) and he verbalizes understanding.  Paper Prescriptions given to father.

## 2021-11-28 NOTE — Discharge Instructions (Signed)
Outpatient Substance Use Treatment Services   [facility] [address] [place], [place] [address]  [phone] Private insurance, Medicare A&B, and GCCN   [facility]  [address]  [place], [place] [address] [phone] Medicaid, Self Pay   [facility]      [address]  [place], [place] [phone] Medicaid and Private Insurance, Self Pay   [facility] [address]  [place], [place]  [phone] Private Insurance, and Self Pay  [facility]      [address]    [place], [place] [address]  [phone] or [phone] Private Insurance Only   [facility] [address]  [place], [place] [address] [phone] Medicaid, Medicare, Private Insurance  [facility] [address]  [place], [place] [address] [phone] Services are free or reduced  [facility]  [address] [phone]  Self Pay only, sliding scale  [facility]  [address]  [place], [place] [address] [phone] ([facility]) Self Pay, Medicaid Only   [facility] [address]  [place], [place] [address] [phone] Medicaid, Medicare, Private Insurance  Residential Substance Use Treatment Services   [facility]  [address]  [place], [place] [address]  [phone] or [phone] Detox (Medicare, Medicaid, private insurance, and self pay)  Residential Rehab 14 days (Medicare, Medicaid, private insurance, and self pay)   [facility]  [address] [place], [place]  [phone]  Male and Male Detox (Self Pay and Medicaid limited availability)  Rehab only Male (Medicaid and self pay only)   [facility]      [address]   [place], [place] [address]  [phone] or [phone] Detox and Residential Treatment Private Insurance Only   [facility]  [address]  [place], [place] [address]  [phone]  Treatment Only, must make assessment appointment, and must be sober for assessment appointment.  Self Pay Only, Medicare A&B, [place] Medicaid, [place] ID only! *Transportation assistance offered from [employer] [address]  [facility]     [address] [place], [place] [address] Walk in interviews M-Sat 8-4p No pending legal charges [phone]  [facility]:  [facility]  [address] [place], [place] [phone] (Self Pay, Medicaid)  [facility] [address] [place], [place] [address] [phone] Detox and Residential Treatment Medicare and Private Insurance  [facility] [address]  [place], [place] [address] 28 Day Women's Facility: [phone] 28 Day Men's Facility: [phone] Long-term Residential Program:  [phone] Males 25 and Over (No Insurance, upfront fee)  [facility]  [address] [place], [place] [address] [phone] Private Insurance with [employer], Private Pay  [facility] [address] [place], [place] [address] Local [phone] Private Insurance Only  [facility] [address]  , [place] [address]  [phone] (Males, upfront fee)  [facility] [place] [address]  [place], [address] [phone] Private Insurance   [facility] Locations  [facility]  [address]  [place], [place]  [phone] Christian Based Program for individuals experiencing homelessness Self Pay, No insurance  [facility]  Men's program: [facility] [address]  [place],  [address] [phone]  [facility] Women[facility]: [facility] [address]   [place], [place] [address] [phone] Christian Based Program for individuals  experiencing homelessness Self Pay, No insurance  [facility] [address]  [place], [place] [address]  [phone] Christian Based Program for individuals experiencing homelessness Self Pay, No insurance  [facility] [address]  [place], [place] [address] [phone] Christian Based Program for individuals experiencing homelessness Self Pay, No insurance  [facility] [address] [place],  [phone] Christian Based Program for males experiencing homelessness Self Pay, No insurance                    

## 2021-11-29 LAB — PTH, INTACT AND CALCIUM
Calcium, Total (PTH): 11 mg/dL — ABNORMAL HIGH (ref 8.6–10.2)
PTH: 23 pg/mL (ref 15–65)

## 2021-12-03 ENCOUNTER — Other Ambulatory Visit: Payer: Self-pay

## 2021-12-03 ENCOUNTER — Emergency Department (HOSPITAL_COMMUNITY)
Admission: EM | Admit: 2021-12-03 | Discharge: 2021-12-06 | Disposition: A | Discharge status: Other Acute Inpatient Hospital | Payer: Medicaid Other | Attending: Emergency Medicine | Admitting: Emergency Medicine

## 2021-12-03 ENCOUNTER — Encounter (HOSPITAL_COMMUNITY): Payer: Self-pay | Admitting: Emergency Medicine

## 2021-12-03 ENCOUNTER — Ambulatory Visit (HOSPITAL_COMMUNITY)
Admission: EM | Admit: 2021-12-03 | Discharge: 2021-12-03 | Disposition: A | Discharge status: Other Acute Inpatient Hospital | Payer: Medicaid Other | Attending: Behavioral Health | Admitting: Behavioral Health

## 2021-12-03 DIAGNOSIS — F200 Paranoid schizophrenia: Secondary | ICD-10-CM | POA: Insufficient documentation

## 2021-12-03 DIAGNOSIS — Z046 Encounter for general psychiatric examination, requested by authority: Secondary | ICD-10-CM | POA: Insufficient documentation

## 2021-12-03 DIAGNOSIS — Z79899 Other long term (current) drug therapy: Secondary | ICD-10-CM | POA: Diagnosis not present

## 2021-12-03 DIAGNOSIS — F209 Schizophrenia, unspecified: Secondary | ICD-10-CM | POA: Diagnosis present

## 2021-12-03 DIAGNOSIS — F22 Delusional disorders: Secondary | ICD-10-CM

## 2021-12-03 DIAGNOSIS — Z20822 Contact with and (suspected) exposure to covid-19: Secondary | ICD-10-CM | POA: Diagnosis not present

## 2021-12-03 DIAGNOSIS — Z7982 Long term (current) use of aspirin: Secondary | ICD-10-CM | POA: Diagnosis not present

## 2021-12-03 LAB — CBC WITH DIFFERENTIAL/PLATELET
Abs Immature Granulocytes: 0.01 10*3/uL (ref 0.00–0.07)
Basophils Absolute: 0 10*3/uL (ref 0.0–0.1)
Basophils Relative: 0 %
Eosinophils Absolute: 0.1 10*3/uL (ref 0.0–0.5)
Eosinophils Relative: 2 %
HCT: 46.4 % (ref 39.0–52.0)
Hemoglobin: 15 g/dL (ref 13.0–17.0)
Immature Granulocytes: 0 %
Lymphocytes Relative: 35 %
Lymphs Abs: 2.8 10*3/uL (ref 0.7–4.0)
MCH: 29.2 pg (ref 26.0–34.0)
MCHC: 32.3 g/dL (ref 30.0–36.0)
MCV: 90.4 fL (ref 80.0–100.0)
Monocytes Absolute: 0.9 10*3/uL (ref 0.1–1.0)
Monocytes Relative: 11 %
Neutro Abs: 4.2 10*3/uL (ref 1.7–7.7)
Neutrophils Relative %: 52 %
Platelets: 207 10*3/uL (ref 150–400)
RBC: 5.13 MIL/uL (ref 4.22–5.81)
RDW: 12.8 % (ref 11.5–15.5)
WBC: 8 10*3/uL (ref 4.0–10.5)
nRBC: 0 % (ref 0.0–0.2)

## 2021-12-03 LAB — COMPREHENSIVE METABOLIC PANEL
ALT: 12 U/L (ref 0–44)
AST: 17 U/L (ref 15–41)
Albumin: 3.6 g/dL (ref 3.5–5.0)
Alkaline Phosphatase: 69 U/L (ref 38–126)
Anion gap: 7 (ref 5–15)
BUN: <5 mg/dL — ABNORMAL LOW (ref 6–20)
CO2: 29 mmol/L (ref 22–32)
Calcium: 10.1 mg/dL (ref 8.9–10.3)
Chloride: 106 mmol/L (ref 98–111)
Creatinine, Ser: 1.15 mg/dL (ref 0.61–1.24)
GFR, Estimated: >60 mL/min (ref >60)
Glucose, Bld: 94 mg/dL (ref 70–99)
Potassium: 4.7 mmol/L (ref 3.5–5.1)
Sodium: 142 mmol/L (ref 135–145)
Total Bilirubin: 0.4 mg/dL (ref 0.3–1.2)
Total Protein: 6.7 g/dL (ref 6.5–8.1)

## 2021-12-03 LAB — RESP PANEL BY RT-PCR (FLU A&B, COVID) ARPGX2
Influenza A by PCR: NEGATIVE
Influenza B by PCR: NEGATIVE
SARS Coronavirus 2 by RT PCR: NEGATIVE

## 2021-12-03 LAB — ETHANOL: Alcohol, Ethyl (B): <10 mg/dL (ref <10)

## 2021-12-03 MED ORDER — LORAZEPAM 1 MG PO TABS
1.0000 mg | ORAL_TABLET | Freq: Once | ORAL | Status: AC
  Administered 2021-12-03: 1 mg via ORAL
  Filled 2021-12-03: qty 1
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
## 2021-12-03 NOTE — ED Notes (Signed)
Pt moved to room at 51 at this time. Noted pt shaking and tremors all over. Spoke with the PA in triage and asked for medication to assist with symptoms at this time
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
## 2021-12-03 NOTE — ED Notes (Signed)
Provider at bedside

## 2021-12-03 NOTE — ED Provider Triage Note (Signed)
Emergency Medicine Provider Triage Evaluation Note  Mark [name] , a 61 y.o. male  was evaluated in triage.  Pt complains of IVC. Sent here from [facility] for medical clearance with [employer] after presenting with auditory and visual hallucinations with hx of schizophrenia not compliant with medications. He denies SI/HI. Denies any other somatic complaints  Review of Systems  Positive:  Negative:   Physical Exam  BP (!) 158/90   Pulse 80   Temp 97.8 F (36.6 C) (Oral)   Resp 18   SpO2 100%  Gen:   Awake, no distress   Resp:  Normal effort  MSK:   Moves extremities without difficulty  Other:    Medical Decision Making  Medically screening exam initiated at 8:32 PM.  Appropriate orders placed.  [name] was informed that the remainder of the evaluation will be completed by another provider, this initial triage assessment does not replace that evaluation, and the importance of remaining in the ED until their evaluation is complete.     Vear Clock 12/03/21 2035
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
## 2021-12-04 DIAGNOSIS — F22 Delusional disorders: Secondary | ICD-10-CM

## 2021-12-04 LAB — RAPID URINE DRUG SCREEN, HOSP PERFORMED
Amphetamines: NOT DETECTED
Barbiturates: NOT DETECTED
Benzodiazepines: NOT DETECTED
Cocaine: NOT DETECTED
Opiates: NOT DETECTED
Tetrahydrocannabinol: NOT DETECTED

## 2021-12-04 LAB — CBG MONITORING, ED: Glucose-Capillary: 125 mg/dL — ABNORMAL HIGH (ref 70–99)

## 2021-12-04 MED ORDER — DIVALPROEX SODIUM ER 500 MG PO TB24
500.0000 mg | ORAL_TABLET | Freq: Two times a day (BID) | ORAL | Status: DC
Start: 2021-12-04 — End: 2021-12-06
  Administered 2021-12-04 – 2021-12-06 (×5): 500 mg via ORAL
  Filled 2021-12-04 (×5): qty 1

## 2021-12-04 MED ORDER — LORAZEPAM 1 MG PO TABS
1.0000 mg | ORAL_TABLET | Freq: Four times a day (QID) | ORAL | Status: DC | PRN
  Administered 2021-12-05: 1 mg via ORAL
  Filled 2021-12-04: qty 1

## 2021-12-04 MED ORDER — BENZTROPINE MESYLATE 1 MG PO TABS
1.0000 mg | ORAL_TABLET | Freq: Two times a day (BID) | ORAL | Status: DC
  Administered 2021-12-04 – 2021-12-06 (×5): 1 mg via ORAL
  Filled 2021-12-04 (×5): qty 1

## 2021-12-04 MED ORDER — HALOPERIDOL 5 MG PO TABS
5.0000 mg | ORAL_TABLET | Freq: Two times a day (BID) | ORAL | Status: DC
  Administered 2021-12-04 – 2021-12-06 (×5): 5 mg via ORAL
  Filled 2021-12-04 (×5): qty 1

## 2021-12-04 MED ORDER — OLANZAPINE 5 MG PO TBDP
10.0000 mg | ORAL_TABLET | Freq: Every day | ORAL | Status: DC
Start: 2021-12-04 — End: 2021-12-06
  Administered 2021-12-04 – 2021-12-05 (×2): 10 mg via ORAL
  Filled 2021-12-04 (×2): qty 2

## 2021-12-04 MED ORDER — ASPIRIN 81 MG PO TBEC
81.0000 mg | DELAYED_RELEASE_TABLET | Freq: Every day | ORAL | Status: DC
  Administered 2021-12-04 – 2021-12-06 (×3): 81 mg via ORAL
  Filled 2021-12-04 (×3): qty 1

## 2021-12-04 MED ORDER — LORAZEPAM 1 MG PO TABS
1.0000 mg | ORAL_TABLET | Freq: Once | ORAL | Status: AC
  Administered 2021-12-04: 1 mg via ORAL
  Filled 2021-12-04: qty 1

## 2021-12-04 MED ORDER — AMLODIPINE BESYLATE 5 MG PO TABS
5.0000 mg | ORAL_TABLET | Freq: Every day | ORAL | Status: DC
  Administered 2021-12-04 – 2021-12-06 (×3): 5 mg via ORAL
  Filled 2021-12-04 (×3): qty 1

## 2021-12-04 MED ORDER — TRAZODONE HCL 50 MG PO TABS
50.0000 mg | ORAL_TABLET | Freq: Every day | ORAL | Status: DC
Start: 2021-12-04 — End: 2021-12-06
  Administered 2021-12-04 – 2021-12-05 (×2): 50 mg via ORAL
  Filled 2021-12-04 (×2): qty 1

## 2021-12-04 MED ORDER — METOPROLOL TARTRATE 25 MG PO TABS
50.0000 mg | ORAL_TABLET | Freq: Two times a day (BID) | ORAL | Status: DC
  Administered 2021-12-04 – 2021-12-06 (×4): 50 mg via ORAL
  Filled 2021-12-04 (×4): qty 2

## 2021-12-04 MED ORDER — PANTOPRAZOLE SODIUM 40 MG PO TBEC
40.0000 mg | DELAYED_RELEASE_TABLET | Freq: Every day | ORAL | Status: DC
  Administered 2021-12-04 – 2021-12-06 (×3): 40 mg via ORAL
  Filled 2021-12-04 (×3): qty 1

## 2021-12-04 MED ORDER — NICOTINE 21 MG/24HR TD PT24
21.0000 mg | MEDICATED_PATCH | Freq: Every day | TRANSDERMAL | Status: DC
  Filled 2021-12-04 (×2): qty 1

## 2021-12-04 NOTE — ED Notes (Signed)
Tremors and diaphoretic - medications given, EKG and blood glucose gotten at this time.

## 2021-12-04 NOTE — ED Notes (Signed)
Mark [name] has finished eating 80 % of his dinner while laughing and enjoying his one-sided conversation.
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
## 2021-12-04 NOTE — ED Notes (Signed)
Mr. Mark [name] is having purposeful tremors to the hand that has the oxygen monitor located on it. The tremors stop when he is asked to hold still.

## 2021-12-04 NOTE — ED Notes (Signed)
Verbal report given to Katrina W RN at this time 

## 2021-12-04 NOTE — ED Notes (Signed)
Pt yelling out when would he have breakfast pt was advised that it would be a little while.

## 2021-12-04 NOTE — ED Notes (Signed)
Belongings 1 blue shirt, 1 pair of blue and red sweat pants, 1 pair of blue shoes. 1 bag placed in locker 6

## 2021-12-04 NOTE — Progress Notes (Signed)
Per Doran Heater, NP, patient meets criteria for inpatient treatment. There are no available beds at [facility] today. CSW faxed referrals to the following facilities for review:  [facility]  Pending - No Request Sent N/A [address] [place] [address] [phone] --  [facility]  Pending - No Request Sent N/A [address] [place] [address] [phone] --  [facility]  Pending - No Request Sent N/A [address] [place] [address] [phone] --  [facility]  Pending - No Request Sent N/A [address] [place] [address] [phone] --  [facility]  Pending - No Request Sent N/A [address] [place] [address] [phone] --  [facility]  Pending - No Request Sent N/A [address] [place] [address] [phone] --  [facility]  Pending - No Request Sent N/A [address] [place], [place] [address] [phone] --  [facility]  Pending - No Request Sent N/A [address], [place] [address] [phone] --  [facility]  Pending - No Request Sent N/A [address] [place] [address] [phone] --  [facility]  Pending - No Request Sent N/A [address], [place] [address] [phone] --  [facility]  Pending - No Request Sent N/A [address], [place] [address] [phone] --  [facility]  Pending - No Request Sent N/A [address] [place] [address] [phone] --  [facility]  Pending - No Request Sent N/A [address] [place] [address] [phone] --  [facility]  Pending - No Request Sent N/A [address] [place] [address] [phone] --  [facility]  Pending - No Request Sent N/A [address], [place] [address] [phone] --  [facility]  Pending - No Request Sent N/A [name] [place] [address] [phone] --  [facility]  Pending - No Request Sent N/A [address], [place] [address] [phone] --  [facility]  Pending - No Request Sent N/A [address] [place] [address] [phone] --   TTS will continue to seek bed placement.  Crissie Reese, MSW, Lenice Pressman Phone: [phone] Disposition/TOC

## 2021-12-04 NOTE — ED Notes (Signed)
Taking a bedside bath, refuses to leave room. Cooperative. Peeping out through the open window. .wide eyed expression.

## 2021-12-04 NOTE — ED Notes (Addendum)
Pt stepped out of room to ask if each of the staff was ok. This RN then stated, "we're all fine, thanks. Do you need anything right now?". Pt states no needs but a cigarette at this time. Will ask for a Nicotine patch order.

## 2021-12-04 NOTE — Progress Notes (Signed)
Csw faxed demographic information to [place] with [facility] admission for further review.  Crissie Reese, MSW, Lenice Pressman Phone: [phone] Disposition/TOC
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
## 2021-12-04 NOTE — ED Notes (Signed)
Mark [name] sitting in dark room in corner. Pt is laughing and responding to stimuli only he can visualize/hear. Mark [name] is in a room free of clutter and no tripping or fall hazards.  Bed is locked window is open where he can be observed and sitter is outside of door.

## 2021-12-04 NOTE — ED Notes (Signed)
Sitting up in chair, finished drinking soda...lights off and tv on per mr. Ramberg request. Pt. Still twitching and responding to internal stimuli.

## 2021-12-04 NOTE — Progress Notes (Signed)
Per [facility] Full[facility] admissions, pt has been accepted to [facility] [place] unit. Accepting provider is Dr. Betti Cruz. Patient can arrive 12/06/2021 after 8:00am. Number for report is [phone].   Crissie Reese, MSW, Len[facility] Pressman Phone: [phone] Disposition/TOC

## 2021-12-04 NOTE — ED Notes (Addendum)
Assumed care of pt. He is currently awake, speaking to himself, walking around in his room and occasionally having sips of his drink. Reports no needs at this time.

## 2021-12-04 NOTE — ED Notes (Signed)
Pt medicated for the night, lights turned off and tv turned to more preferable station. Pt lying in bed, calm and comfortable at this time.

## 2021-12-04 NOTE — ED Notes (Addendum)
Pt up ambulating to restroom at this time provided UA cup to obtain UA sample at this time

## 2021-12-04 NOTE — ED Notes (Signed)
Mark [name] is sitting in a corner of a darken room, currently laughing very loudly and holding a conversation with only person's he can hear/visualize.  Mark [name] is in a safe area with no tripping or fall hazards, sitter outside of room and door is closed. He is able to be visualized from the viewing window. Will continue to observe.

## 2021-12-05 LAB — VALPROIC ACID LEVEL: Valproic Acid Lvl: 59 ug/mL (ref 50.0–100.0)

## 2021-12-05 NOTE — ED Notes (Signed)
Assumed care of pt at 0710. Pt currently sitting in room eating breakfast, has come up to window in room once to look through window

## 2021-12-05 NOTE — ED Notes (Signed)
Pt laughing and interacting with staff, walked to restroom to wash face.

## 2021-12-05 NOTE — ED Notes (Signed)
Pt currently pacing room and yelling, punching and fighting something that is not there. Pt staying in room but behavior becoming more aggressive toward internal stimuli, but pt is pleasant with staff when asked about needs. PRN medication to be given

## 2021-12-05 NOTE — ED Provider Notes (Signed)
Emergency Medicine Observation Re-evaluation Note  Mark [name] is a 61 y.o. male, seen on rounds today.  Pt initially presented to the ED for complaints of IVC ( Agressive Behavior)  Currently, the patient is calm.  He is again sitting in a room with the light off watching TV.   Physical Exam  BP 128/79 (BP Location: Right Arm)   Pulse 74   Temp 98.3 F (36.8 C) (Oral)   Resp 19   SpO2 97%  Physical Exam General: calm Cardiac: rr Lungs: breathing well Psych: calm  ED Course / MDM  EKG:EKG Interpretation  Date/Time:  Friday December 03 2021 20:07:02 EDT Ventricular Rate:  70 PR Interval:  154 QRS Duration: 84 QT Interval:  392 QTC Calculation: 423 R Axis:   80 Text Interpretation: Normal sinus rhythm Normal ECG When compared with ECG of 28-Jun-2021 22:43, PREVIOUS ECG IS PRESENT Confirmed by Alona Bene [phone]) on 12/04/2021 6:06:21 PM  I have reviewed the labs performed to date as well as medications administered while in observation.  Recent changes in the last 24 hours include none.  Plan  Current plan is for inpatient psych placement. [name] is under involuntary commitment.      Jacalyn Lefevre, MD 12/05/21 1140

## 2021-12-05 NOTE — ED Notes (Signed)
Pt still pacing around room, comes up to window of door and angrily but quietly speaks to someone who is not there. Pt remains calm and cooperative with staff and denies needs when asked

## 2021-12-05 NOTE — ED Notes (Signed)
This nurse went to room to check on pt d/t patient making wild movements with his arms, pt states "sorry I'm sweating, I've been playing basketball." Pt has no other needs at this time and continues to laugh to self and respond to internal stimuli

## 2021-12-05 NOTE — Consult Note (Signed)
  Saw patient in his room at [facility] ED for reevaluation.  Patient's tremors have decreased however they are still noticeable.  Patient reports improvement in tremors and stated he feels more relaxed today.  Patient stated he slept well last night.  He denies any suicidal or homicidal thoughts.  Denies any auditory or visual hallucinations.  However, the patient can be seen responding to internal stimuli in his room and have a one-sided conversation with laughter.  We will continue current medication regimen.  Patient's valproic acid level did result therapeutic at 59 ug/ml.   [facility] will be transferred to [facility] for inpatient psychiatric treatment.  Will continue with this recommendation.

## 2021-12-06 NOTE — ED Notes (Signed)
Report given to Cletis Athens, rn at [facility].

## 2021-12-06 NOTE — ED Provider Notes (Signed)
Emergency Medicine Observation Re-evaluation Note  Mark [name] is a 61 y.o. male, seen on rounds today.  Pt initially presented to the ED for complaints of IVC ( Agressive Behavior)  Currently, the patient is resting after receiving PRN medications IM for agitation.  Physical Exam  BP 136/82 (BP Location: Left Arm)   Pulse (!) 51   Temp 97.6 F (36.4 C) (Oral)   Resp 18   SpO2 99%  Physical Exam General: NAD Cardiac: well perfused Lungs: breathing well Psych: No agitation  ED Course / MDM  EKG:EKG Interpretation  Date/Time:  Friday December 03 2021 20:07:02 EDT Ventricular Rate:  70 PR Interval:  154 QRS Duration: 84 QT Interval:  392 QTC Calculation: 423 R Axis:   80 Text Interpretation: Normal sinus rhythm Normal ECG When compared with ECG of 28-Jun-2021 22:43, PREVIOUS ECG IS PRESENT Confirmed by Alona Bene [phone]) on 12/04/2021 6:06:21 PM  I have reviewed the labs performed to date as well as medications administered while in observation.  Recent changes in the last 24 hours include none.  Plan  Current plan is for inpatient psych placement. [name] is under involuntary commitment.        Ernie Avena, MD 12/06/21 [phone]

## 2021-12-06 NOTE — ED Notes (Signed)
Patients belongings given to officer.

## 2021-12-07 NOTE — Progress Notes (Signed)
_Clinically Undetermined °

## 2021-12-07 NOTE — Progress Notes (Signed)
Acute encephalopathy

## 2021-12-23 ENCOUNTER — Encounter (HOSPITAL_COMMUNITY): Payer: Self-pay | Admitting: Emergency Medicine

## 2021-12-23 ENCOUNTER — Other Ambulatory Visit: Payer: Self-pay

## 2021-12-23 ENCOUNTER — Ambulatory Visit (HOSPITAL_COMMUNITY)
Admission: EM | Admit: 2021-12-23 | Discharge: 2021-12-23 | Disposition: A | Discharge status: Other Acute Inpatient Hospital | Payer: Medicaid Other | Attending: Family | Admitting: Family

## 2021-12-23 ENCOUNTER — Emergency Department (HOSPITAL_COMMUNITY)
Admission: EM | Admit: 2021-12-23 | Discharge: 2021-12-27 | Disposition: A | Discharge status: Home / Self Care | Payer: Medicaid Other | Attending: Emergency Medicine | Admitting: Emergency Medicine

## 2021-12-23 DIAGNOSIS — F319 Bipolar disorder, unspecified: Secondary | ICD-10-CM | POA: Diagnosis not present

## 2021-12-23 DIAGNOSIS — F22 Delusional disorders: Secondary | ICD-10-CM | POA: Diagnosis not present

## 2021-12-23 DIAGNOSIS — I10 Essential (primary) hypertension: Secondary | ICD-10-CM | POA: Diagnosis not present

## 2021-12-23 DIAGNOSIS — Z20822 Contact with and (suspected) exposure to covid-19: Secondary | ICD-10-CM | POA: Insufficient documentation

## 2021-12-23 DIAGNOSIS — F209 Schizophrenia, unspecified: Secondary | ICD-10-CM | POA: Diagnosis present

## 2021-12-23 DIAGNOSIS — Z7982 Long term (current) use of aspirin: Secondary | ICD-10-CM | POA: Insufficient documentation

## 2021-12-23 DIAGNOSIS — R4781 Slurred speech: Secondary | ICD-10-CM | POA: Insufficient documentation

## 2021-12-23 DIAGNOSIS — R61 Generalized hyperhidrosis: Secondary | ICD-10-CM | POA: Insufficient documentation

## 2021-12-23 DIAGNOSIS — F10950 Alcohol use, unspecified with alcohol-induced psychotic disorder with delusions: Secondary | ICD-10-CM | POA: Diagnosis present

## 2021-12-23 DIAGNOSIS — Z79899 Other long term (current) drug therapy: Secondary | ICD-10-CM | POA: Insufficient documentation

## 2021-12-23 DIAGNOSIS — R251 Tremor, unspecified: Secondary | ICD-10-CM | POA: Insufficient documentation

## 2021-12-23 DIAGNOSIS — J449 Chronic obstructive pulmonary disease, unspecified: Secondary | ICD-10-CM | POA: Diagnosis not present

## 2021-12-23 DIAGNOSIS — F29 Unspecified psychosis not due to a substance or known physiological condition: Secondary | ICD-10-CM

## 2021-12-23 DIAGNOSIS — F200 Paranoid schizophrenia: Secondary | ICD-10-CM | POA: Diagnosis not present

## 2021-12-23 LAB — CBC WITH DIFFERENTIAL/PLATELET
Abs Immature Granulocytes: 0.11 10*3/uL — ABNORMAL HIGH (ref 0.00–0.07)
Basophils Absolute: 0.1 10*3/uL (ref 0.0–0.1)
Basophils Relative: 1 %
Eosinophils Absolute: 0.2 10*3/uL (ref 0.0–0.5)
Eosinophils Relative: 2 %
HCT: 45 % (ref 39.0–52.0)
Hemoglobin: 14.4 g/dL (ref 13.0–17.0)
Immature Granulocytes: 1 %
Lymphocytes Relative: 32 %
Lymphs Abs: 3.2 10*3/uL (ref 0.7–4.0)
MCH: 29 pg (ref 26.0–34.0)
MCHC: 32 g/dL (ref 30.0–36.0)
MCV: 90.5 fL (ref 80.0–100.0)
Monocytes Absolute: 1.2 10*3/uL — ABNORMAL HIGH (ref 0.1–1.0)
Monocytes Relative: 12 %
Neutro Abs: 5.3 10*3/uL (ref 1.7–7.7)
Neutrophils Relative %: 52 %
Platelets: 313 10*3/uL (ref 150–400)
RBC: 4.97 MIL/uL (ref 4.22–5.81)
RDW: 13.1 % (ref 11.5–15.5)
WBC: 10 10*3/uL (ref 4.0–10.5)
nRBC: 0 % (ref 0.0–0.2)

## 2021-12-23 LAB — COMPREHENSIVE METABOLIC PANEL
ALT: 9 U/L (ref 0–44)
AST: 12 U/L — ABNORMAL LOW (ref 15–41)
Albumin: 3.4 g/dL — ABNORMAL LOW (ref 3.5–5.0)
Alkaline Phosphatase: 63 U/L (ref 38–126)
Anion gap: 6 (ref 5–15)
BUN: 9 mg/dL (ref 6–20)
CO2: 26 mmol/L (ref 22–32)
Calcium: 9.6 mg/dL (ref 8.9–10.3)
Chloride: 107 mmol/L (ref 98–111)
Creatinine, Ser: 1.18 mg/dL (ref 0.61–1.24)
GFR, Estimated: >60 mL/min (ref >60)
Glucose, Bld: 109 mg/dL — ABNORMAL HIGH (ref 70–99)
Potassium: 4.4 mmol/L (ref 3.5–5.1)
Sodium: 139 mmol/L (ref 135–145)
Total Bilirubin: 0.5 mg/dL (ref 0.3–1.2)
Total Protein: 7 g/dL (ref 6.5–8.1)

## 2021-12-23 LAB — TROPONIN I (HIGH SENSITIVITY): Troponin I (High Sensitivity): 8 ng/L (ref <18)

## 2021-12-23 LAB — SALICYLATE LEVEL: Salicylate Lvl: <7 mg/dL — ABNORMAL LOW (ref 7.0–30.0)

## 2021-12-23 LAB — ACETAMINOPHEN LEVEL: Acetaminophen (Tylenol), Serum: <10 ug/mL — ABNORMAL LOW (ref 10–30)

## 2021-12-23 MED ORDER — ZIPRASIDONE MESYLATE 20 MG IM SOLR: 20.0000 mg | INTRAMUSCULAR | Status: DC | PRN

## 2021-12-23 MED ORDER — AMLODIPINE BESYLATE 5 MG PO TABS
5.0000 mg | ORAL_TABLET | Freq: Every day | ORAL | Status: DC
  Administered 2021-12-23 – 2021-12-27 (×5): 5 mg via ORAL
  Filled 2021-12-23 (×5): qty 1

## 2021-12-23 MED ORDER — NICOTINE 21 MG/24HR TD PT24
21.0000 mg | MEDICATED_PATCH | Freq: Every day | TRANSDERMAL | Status: DC
  Filled 2021-12-23 (×2): qty 1

## 2021-12-23 MED ORDER — LORAZEPAM 1 MG PO TABS: 1.0000 mg | ORAL_TABLET | ORAL | Status: DC | PRN

## 2021-12-23 MED ORDER — ACETAMINOPHEN 325 MG PO TABS
650.0000 mg | ORAL_TABLET | ORAL | Status: DC | PRN
  Administered 2021-12-27: 650 mg via ORAL
  Filled 2021-12-23: qty 2

## 2021-12-23 MED ORDER — METOPROLOL TARTRATE 25 MG PO TABS
50.0000 mg | ORAL_TABLET | Freq: Two times a day (BID) | ORAL | Status: DC
  Administered 2021-12-24: 50 mg via ORAL
  Filled 2021-12-23: qty 2

## 2021-12-23 MED ORDER — RISPERIDONE 1 MG PO TBDP
2.0000 mg | ORAL_TABLET | Freq: Three times a day (TID) | ORAL | Status: DC | PRN
  Administered 2021-12-27: 2 mg via ORAL
  Filled 2021-12-23: qty 2

## 2021-12-23 MED ORDER — ALUM & MAG HYDROXIDE-SIMETH 200-200-20 MG/5ML PO SUSP: 30.0000 mL | Freq: Four times a day (QID) | ORAL | Status: DC | PRN

## 2021-12-23 MED ORDER — ONDANSETRON HCL 4 MG PO TABS
4.0000 mg | ORAL_TABLET | Freq: Three times a day (TID) | ORAL | Status: DC | PRN
Start: 2021-12-23 — End: 2021-12-27

## 2021-12-23 NOTE — ED Provider Triage Note (Signed)
Emergency Medicine Provider Triage Evaluation Note  Mark [name] , a 62 y.o. male  was evaluated in triage.  Pt complains of elevated blood pressure. The pateint was [facility] and was brought into [facility] when they noticed his blood pressure and sent him here. Per [employer], the IVC paperwork has already been filed.   Review of Systems  Positive:  Negative:   Physical Exam  BP (!) 205/135 (BP Location: Left Arm)   Pulse 88   Temp 98 F (36.7 C) (Oral)   Resp 18   SpO2 96%  Gen:   Awake, no distress   Resp:  Normal effort  MSK:   Moves extremities without difficulty  Other:  Odd affect  Medical Decision Making  Medically screening exam initiated at 2:41 PM.  Appropriate orders placed.  [name] was informed that the remainder of the evaluation will be completed by another provider, this initial triage assessment does not replace that evaluation, and the importance of remaining in the ED until their evaluation is complete.  Medical clearance labs ordered. He denies any chest pain, blurry viison, headaches, or SOB.    Achille Rich, [place] 12/23/21 1448

## 2021-12-23 NOTE — ED Notes (Signed)
Patient discharge via ambulatory with a steady gait with [employer] officers to [facility] for medical clearance. Respirations equal and unlabored, skin warm and dry. No acute distress noted.

## 2021-12-23 NOTE — Progress Notes (Signed)
Patient presents with police on IVC petitioned by a family member.  States that patient is off his meds, not attending to personal hygiene, not sleeping and aggressive towards petitioner.Patient states that he has been compliant with taking his medications.  Denies SI/HI, but states that he is experiencing visual hallucinations, but denies auditory hallucinations.  Patient admits that he has not been sleeping well.  He denies aggressive behaviors other than telling people not to tell him what to do.  He states that he has been stern, but denies any physical aggression.  Patient has visible and uncontrollable shakes.  He denies any drug or alcohol use.  States that he is not using any drugs or alcohol.

## 2021-12-23 NOTE — ED Provider Notes (Signed)
Emergency Department Provider Note   I have reviewed the triage vital signs and the nursing notes.   HISTORY  Chief Complaint IVC    HPI Mark [name] is a 61 y.o. male with PMH of COPD, HTN, and schizophrenia presents to the emergency department under IVC from behavioral health urgent care.  Patient evaluated there from psychiatry perspective with plan for inpatient treatment.  Transferred here for medical clearance in the setting of hypertension.  Patient tells me he is not taking any of his medications and does not know them by name.  He is not having any chest pain or other discomfort.   Past Medical History:  Diagnosis Date   COPD (chronic obstructive pulmonary disease) (HCC)    Hypertension    Schizophrenia (HCC)     Review of Systems  Constitutional: No fever/chills Eyes: No visual changes. Cardiovascular: Denies chest pain. Respiratory: Denies shortness of breath. Gastrointestinal: No abdominal pain.   ____________________________________________   PHYSICAL EXAM:  VITAL SIGNS: ED Triage Vitals  Enc Vitals Group     BP 12/23/21 1359 (!) 205/135     Pulse Rate 12/23/21 1359 88     Resp 12/23/21 1359 18     Temp 12/23/21 1359 98 F (36.7 C)     Temp Source 12/23/21 1359 Oral     SpO2 12/23/21 1359 96 %   Constitutional: Alert and conversational. Mumbling to himself at times. No distress.  Eyes: Conjunctivae are normal.  Head: Atraumatic. Nose: No congestion/rhinnorhea. Mouth/Throat: Mucous membranes are moist.  Neck: No stridor.   Cardiovascular: Good peripheral circulation.  Respiratory: Normal respiratory effort.   Gastrointestinal: No distention.  Musculoskeletal: No gross deformities of extremities. Neurologic:  Normal speech and language. No gross focal neurologic deficits are appreciated.  Skin:  Skin is warm, dry and intact. No rash noted.  ____________________________________________   LABS (all labs ordered are listed, but only  abnormal results are displayed)  Labs Reviewed  CBC WITH DIFFERENTIAL/PLATELET - Abnormal; Notable for the following components:      Result Value   Monocytes Absolute 1.2 (*)    Abs Immature Granulocytes 0.11 (*)    All other components within normal limits  COMPREHENSIVE METABOLIC PANEL - Abnormal; Notable for the following components:   Glucose, Bld 109 (*)    Albumin 3.4 (*)    AST 12 (*)    All other components within normal limits  ACETAMINOPHEN LEVEL - Abnormal; Notable for the following components:   Acetaminophen (Tylenol), Serum <10 (*)    All other components within normal limits  SALICYLATE LEVEL - Abnormal; Notable for the following components:   Salicylate Lvl <7.0 (*)    All other components within normal limits  RAPID URINE DRUG SCREEN, HOSP PERFORMED  URINALYSIS, ROUTINE W REFLEX MICROSCOPIC  TROPONIN I (HIGH SENSITIVITY)  TROPONIN I (HIGH SENSITIVITY)   ____________________________________________   PROCEDURES  Procedure(s) performed:   Procedures  None  ____________________________________________   INITIAL IMPRESSION / ASSESSMENT AND PLAN / ED COURSE  Pertinent labs & imaging results that were available during my care of the patient were reviewed by me and considered in my medical decision making (see chart for details).   This patient is Presenting for Evaluation of AMS, which does require a range of treatment options, and is a complaint that involves a high risk of morbidity and mortality.  The Differential Diagnoses includes but is not exclusive to alcohol, illicit or prescription medications, intracranial pathology such as stroke, intracerebral hemorrhage, fever or  infectious causes including sepsis, hypoxemia, uremia, trauma, endocrine related disorders such as diabetes, hypoglycemia, thyroid-related diseases, etc.   I decided to review pertinent External Data, and in summary patient evaluated at behavioral health urgent care under IVC.  Plan is  for psychiatry admission.  First exam completed by behavioral health urgent care per their note.   Clinical Laboratory Tests Ordered, included troponin which is normal.  Tylenol and salicylate normal.  No anemia.  No acute kidney injury.   Social Determinants of Health Risk positive smoking history.    Consult complete with Psychiatry. Plan for inpatient psychiatry.   Medical Decision Making: Summary:  Patient here in the emergency department for evaluation of of elevated blood pressure.  He is asymptomatic by my assessment with no lab finding to suggest endorgan damage.  I will restart his amlodipine as well as metoprolol.  After doing this his blood pressure is improving.  Can continue to manage as an outpatient by his PCP after psychiatry disposition.    Disposition: pending   ____________________________________________  FINAL CLINICAL IMPRESSION(S) / ED DIAGNOSES  Final diagnoses:  Psychosis, unspecified psychosis type (HCC)  Primary hypertension    Note:  This document was prepared using Dragon voice recognition software and may include unintentional dictation errors.  Alona Bene, MD, [facility]    Annleigh Knueppel, Arlyss Repress, MD 12/23/21 2211

## 2021-12-23 NOTE — BH Assessment (Signed)
Comprehensive Clinical Assessment (CCA) Screening, Triage and Referral Note  12/23/2021 Mark [name] [id number]  Disposition: Per Doran Heater, NP, patient is recommended for inpatient treatment and will be transferred to the ED until bed placement is found.   Flowsheet Row ED from 12/23/2021 in [facility] ED from 12/[facility] [address] EMERGENCY DEPARTMENT ED to Hosp-Admission (Discharged) from 11/24/2021 in [facility] LONG 4TH FLOOR PROGRESSIVE CARE AND UROLOGY  C-SSRS RISK CATEGORY No Risk No Risk No Risk      The patient demonstrates the following risk factors for suicide: Chronic risk factors for suicide include: psychiatric disorder of schizophrenia . Acute risk factors for suicide include: family or marital conflict and recent discharge from inpatient psychiatry. Protective factors for this patient include: positive social support and positive therapeutic relationship. Considering these factors, the overall suicide risk at this point appears to be low. Patient is not appropriate for outpatient follow up.  Chief Complaint:  Chief Complaint  Patient presents with   Schizophrenia   Visit Diagnosis: Paranoid schizophrenia (HCC)  Patient Reported Information How did you hear about [place]? Legal System  What Is the Reason for Your Visit/Call Today? Patient presents with police on IVC petitioned by a family member.  States that patient is off his meds, not attending to personal hygiene, not sleeping and aggressive towards petitioner.Patient states that he has been compliant with taking his medications.  Denies SI/HI, but states that he is experiencing visual hallucinations, but denies auditory hallucinations.  Patient admits that he has not been sleeping well.  He denies aggressive behaviors other than telling people not to tell him what to do.  He states that he has been stern, but denies any physical aggression.  Patient has visible and  uncontrollable shakes.  He denies any drug or alcohol use.  States that he is not using any drugs or alcohol.  Patient has a documented history of schizophrenia and receives medication management at [facility] with Dr. Merlyn Albert and attends Day Treatment at [facility] a few times a week. Patient reports he takes his medications but feels the medications is "stale". Patient states "give me the right medications so I can sleep". Patient lives at home with his brother, cousin and father and he reports having an argument with family due to someone having vomited on toilet and sink. Patient does not have access to a firearm and patient apparently is only eating snacks at home. Patient denies making any threats to harm or kill anyone. Patient is anxious and a but agitated but non-threatening. His speech is pressured and loud and he has psychomotor agitation in arms and legs. Patient denies SI, HI, AVH and substance use.   How Long Has This Been Causing You Problems? 1 wk - 1 month  What Do You Feel Would Help You the Most Today? Treatment for Depression or other mood problem   Have You Recently Had Any Thoughts About Hurting Yourself? Yes  Are You Planning to Commit Suicide/Harm Yourself At This time? No   Have you Recently Had Thoughts About Hurting Someone [name]? No  Are You Planning to Harm Someone at This Time? No  Explanation: No data recorded  Have You Used Any Alcohol or Drugs in the Past 24 Hours? No  How Long Ago Did You Use Drugs or Alcohol? No data recorded What Did You Use and How Much? No data recorded  Do You Currently Have a Therapist/Psychiatrist? Yes  Name of Therapist/Psychiatrist: Merlyn Albert at [facility]  Have You Been Recently Discharged From Any Office Practice or Programs? Yes  Explanation of Discharge From Practice/Program: [facility]    CCA Screening Triage Referral Assessment Type of Contact: Face-to-Face  Telemedicine Service Delivery:   Is this Initial or  Reassessment? Initial Assessment  Date Telepsych consult ordered in [facility]:  06/28/21  Time Telepsych consult ordered in [facility]:  0120  Location of Assessment: [facility] Assessment Services  Provider Location: [facility] Assessment Services   Collateral Involvement: attempted collateral from patient father   Does Patient Have a Automotive engineer Guardian? No data recorded Name and Contact of Legal Guardian: No data recorded If Minor and Not Living with Parent(s), Who has Custody? NA  Is CPS involved or ever been involved? -- Rich Reining)  Is APS involved or ever been involved? -- Rich Reining)   Patient Determined To Be At Risk for Harm To Self or Others Based on Review of Patient Reported Information or Presenting Complaint? No  Method: No Plan  Availability of Means: No access or NA  Intent: Vague intent or NA  Notification Required: No need or identified person  Additional Information for Danger to Others Potential: Active psychosis  Additional Comments for Danger to Others Potential: NA  Are There Guns or Other Weapons in Your Home? No  Types of Guns/Weapons: No data recorded Are These Weapons Safely Secured?                            No data recorded Who Could Verify You Are Able To Have These Secured: No data recorded Do You Have any Outstanding Charges, Pending Court Dates, Parole/Probation? Pt denies  Contacted To Inform of Risk of Harm To Self or Others: No data recorded  Does Patient Present under Involuntary Commitment? Yes  IVC Papers Initial File Date: 12/23/21   [place] of Residence: Mark [name]   Patient Currently Receiving the Following Services: Medication Management; Day Treatment   Determination of Need: Urgent (48 hours)   Options For Referral: Medication Management; Outpatient Therapy; Inpatient Hospitalization   Discharge Disposition:     Audree Camel, [facility]

## 2021-12-23 NOTE — ED Notes (Signed)
Patient was given 3 packs of graham crackers and orange juice. Patient was noted to be eating graham  cracker as if he was starving. Encouragement and support  provided and safety maintain.

## 2021-12-23 NOTE — ED Notes (Signed)
[employer] called for transportation services to [facility]. Pattricia Boss RN informed

## 2021-12-23 NOTE — ED Triage Notes (Signed)
Patient BIB w/ [employer] as an IVC. Initially went to [facility] but sent her for HTN and medical clearance.
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
## 2021-12-23 NOTE — ED Notes (Signed)
Report called to Pattricia Boss RN at [facility].

## 2021-12-24 LAB — URINALYSIS, ROUTINE W REFLEX MICROSCOPIC
Bilirubin Urine: NEGATIVE
Glucose, UA: NEGATIVE mg/dL
Hgb urine dipstick: NEGATIVE
Ketones, ur: NEGATIVE mg/dL
Leukocytes,Ua: NEGATIVE
Nitrite: NEGATIVE
Protein, ur: NEGATIVE mg/dL
Specific Gravity, Urine: 1.017 (ref 1.005–1.030)
pH: 6 (ref 5.0–8.0)

## 2021-12-24 LAB — RAPID HIV SCREEN (HIV 1/2 AB+AG)
HIV 1/2 Antibodies: NONREACTIVE
HIV-1 P24 Antigen - HIV24: NONREACTIVE

## 2021-12-24 LAB — HIV ANTIBODY (ROUTINE TESTING W REFLEX): HIV Screen 4th Generation wRfx: NONREACTIVE

## 2021-12-24 LAB — SARS CORONAVIRUS 2 BY RT PCR: SARS Coronavirus 2 by RT PCR: NEGATIVE

## 2021-12-24 LAB — VALPROIC ACID LEVEL: Valproic Acid Lvl: 18 ug/mL — ABNORMAL LOW (ref 50.0–100.0)

## 2021-12-24 MED ORDER — DIVALPROEX SODIUM ER 500 MG PO TB24
500.0000 mg | ORAL_TABLET | Freq: Two times a day (BID) | ORAL | Status: DC
  Administered 2021-12-24 – 2021-12-27 (×6): 500 mg via ORAL
  Filled 2021-12-24 (×6): qty 1

## 2021-12-24 MED ORDER — TRAZODONE HCL 50 MG PO TABS
50.0000 mg | ORAL_TABLET | Freq: Every day | ORAL | Status: DC
  Administered 2021-12-24 – 2021-12-26 (×3): 50 mg via ORAL
  Filled 2021-12-24 (×3): qty 1

## 2021-12-24 MED ORDER — VITAMIN D (ERGOCALCIFEROL) 1.25 MG (50000 UNIT) PO CAPS: 50000.0000 [IU] | ORAL_CAPSULE | ORAL | Status: DC

## 2021-12-24 MED ORDER — VITAMIN D (ERGOCALCIFEROL) 1.25 MG (50000 UNIT) PO CAPS
50000.0000 [IU] | ORAL_CAPSULE | ORAL | Status: DC
  Administered 2021-12-26: 50000 [IU] via ORAL
  Filled 2021-12-24 (×2): qty 1

## 2021-12-24 MED ORDER — BENZTROPINE MESYLATE 1 MG PO TABS
1.0000 mg | ORAL_TABLET | Freq: Two times a day (BID) | ORAL | Status: DC
  Administered 2021-12-24 – 2021-12-27 (×7): 1 mg via ORAL
  Filled 2021-12-24 (×7): qty 1

## 2021-12-24 MED ORDER — GABAPENTIN 400 MG PO CAPS
400.0000 mg | ORAL_CAPSULE | Freq: Three times a day (TID) | ORAL | Status: DC
  Administered 2021-12-24 – 2021-12-27 (×10): 400 mg via ORAL
  Filled 2021-12-24 (×10): qty 1

## 2021-12-24 MED ORDER — HALOPERIDOL 5 MG PO TABS
5.0000 mg | ORAL_TABLET | Freq: Two times a day (BID) | ORAL | Status: DC
  Administered 2021-12-24 – 2021-12-25 (×4): 5 mg via ORAL
  Filled 2021-12-24 (×4): qty 1

## 2021-12-24 MED ORDER — PROPRANOLOL HCL 10 MG PO TABS
10.0000 mg | ORAL_TABLET | Freq: Every day | ORAL | Status: DC
  Administered 2021-12-24 – 2021-12-27 (×4): 10 mg via ORAL
  Filled 2021-12-24 (×5): qty 1

## 2021-12-24 NOTE — ED Notes (Signed)
TTS machine in room, NP on screen.

## 2021-12-24 NOTE — ED Notes (Signed)
Pt ambulatory to nursing station, requesting to call his dad. Dialed phone number per his request, no answer.

## 2021-12-24 NOTE — Progress Notes (Signed)
Patient has been denied by [facility] due to no appropriate beds available. Patient meets BH inpatient criteria per Doran Heater, NP. Patient has been faxed out to the following facilities:    [facility]  [address] [place], [place] [address] [phone]  [facility]  [address] [place] [address] [phone]  [facility]  [address], [place] [address] [phone]  [facility]  [address] [place] [address] [phone]  [facility]  [address] [place] [address] [phone]  [facility] [address]  [address], [place] [address] [phone]  [facility]  [address] [place] [address] [phone]  [facility]  [address] [place], [place] [address] [phone]  [facility]  [address] [place], [place] [address] [phone]  [facility]  [address] [place] [address] [phone]  [facility]  [address], [place] [address] [phone]  [facility]  [address] [place] [address] [phone]  [facility]  [address], [place] [address] [phone]  [facility]  [address], [place] [address] [phone]  [facility]  [address] [place] [address] [phone]  [facility]  [address], [place] [address] [phone]  [facility]  [address] [place], [place] [address] [phone]   [facility]  [address], [place] [address] [phone]  [facility]  [address] [place], [place] [address] [phone]  [facility]  [address] [place] [address] [phone]  [facility]  [address], [place] [address] [phone]  [facility]  [address] [place] [place] [address] [phone]  [facility]  [address] [place], [place] [address] [phone]  [facility]  [address] [place] [address] [phone]  [facility]  [address] [place] [address] [phone]  [facility]  [address] [place] [address] [phone]  [facility]  [address] [place] [address] [phone]  [facility]  [address], [place] [address] [phone]  [facility]  [address] [place] [address] [phone]  [facility]  [address], [place] [address] [phone]  [facility]  [address] [place] [address] [phone]  [facility]  [address], [place] [address] [phone]  [facility]  [address] [place] [address] [phone]  [facility]  [address], [place] [address]     Damita Dunnings, MSW, LCSW-A  10:41 PM 12/24/2021

## 2021-12-24 NOTE — ED Provider Notes (Signed)
Emergency Medicine Observation Re-evaluation Note  Mark [name] is a 61 y.o. male, seen on rounds today.  Pt initially presented to the ED for complaints of concerns for decompensated schizophrenia Currently, the patient is ambulatory in his room.  Physical Exam  BP (!) 144/84 (BP Location: Right Arm)   Pulse 99   Temp 99.7 F (37.6 C) (Oral)   Resp 12   SpO2 99%  Physical Exam General: Appears to be resting comfortably in bed, no acute distress. Cardiac: Regular rate, normal heart rate, non-emergent blood pressure for this morning's vitals. Lungs: No increased work of breathing.  Equal chest rise appreciated Psych: Calm, asleep in bed.   ED Course / MDM  EKG:   I have reviewed the labs performed to date as well as medications administered while in observation.  Recent changes in the last 24 hours include medical clearance by prior provider.  Plan  Current plan is for psychiatric placement.  Patient in no acute distress resting comfortably this AM. [name] is under involuntary commitment.      Glyn Ade, MD 12/24/21 [phone]

## 2021-12-24 NOTE — ED Notes (Signed)
This secretary went to purple zone to check patients in the zones IVC paperwork and found the patient had no IVC paperwork on his clipboard. I was able to locate the paperwork in the red folder. I gave it to other secretary Asher Muir who was located in blue zone to make copies and place back on clipboard.
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
## 2021-12-24 NOTE — BH Assessment (Addendum)
Patient continues to meet criteria for inpatient psychiatric treatment. No [facility] beds available. Referrals have been faxed out to other hospitals for consideration of bed placement.    [facility]  Pending - Request Sent N/A [address] [place], [place] [address] [phone] --  [facility]  Pending - Request Sent N/A [address] [place] [address] [phone] --  [facility]  Pending - Request Sent N/A [address] [place] [address] [phone] --  [facility]  Pending - Request Sent N/A [address] [place] [address] [phone] --  [facility]  Pending - No Request Sent N/A [address] [place] [address] [phone] --  [facility]  Pending - No Request Sent N/A [address] [place] [address] [phone] --  [facility]  Pending - No Request [facility] [address] [place] [address] [phone] --  [facility]  Pending - No Request Sent N/A [address] [place] [address] [phone] --  [facility]  Pending - No Request Sent N/A [address] [place] [address] [phone] --  [facility]  Pending - No Request Sent N/A [address] [place] [address] [phone] --  [facility]  Pending - No Request Sent N/A [address] [place] [address] [phone] --  [facility]  Pending - No Request Sent N/A [address] [place] [address] [phone] --  [facility]  Pending - No Request Sent N/A [address] [place] [address] [phone] --   [facility]  Pending - No Request Sent N/A [address], [place] [address] [phone] --  [facility]  Pending - No Request Sent N/A [address] [place] [address] [phone] --  [facility]  Pending - No Request Sent N/A [address] [name] [address] [phone] --  [facility]  Pending - No Request Sent N/A [address] [place], [place] [address] [phone] --  [facility]  Pending - No Request Sent N/A [address], [place] [address] [phone] --

## 2021-12-24 NOTE — Progress Notes (Signed)
Inpatient Behavioral Health Placement  Meets inpatient criteria per Doran Heater, FNP. There are no available beds at [facility] per [facility] Fransico Michael, RN.  Referral was sent to the following facilities;    Destination Service Provider Address Phone Fax  [facility]  [address] [place], [place] [address] [phone]  [facility]  [address], [place] [address] [phone]  [facility]  [address] [place], [place] [address] [phone]  [facility]  [address] [place] [address] [phone]  [facility]  [address] [place] [address] [phone]  [facility] [address]  [address] [place] [address] [phone]  [facility]  [address] [place] [address] [phone]  [facility]  [address] [place], [place] [address] [phone]  [facility]  [address] [place], [place] [address] [phone]  [facility]  [address] [place] [address] [phone]  [facility]  [address] [place] [address] [phone]  [facility]  [address] [place] [address] [phone]  [facility]  [address] [place] [address] [phone]  [facility]  [address], [place] [address] [phone]  [facility]  [address] [place] [address] [phone]  [facility]  [address] [place] [address] [phone]  [facility]  [address] [place], [place] [address] [phone]  [facility]  [address] [place] [address] [phone]   Situation ongoing,  CSW will follow up.   Maryjean Ka, MSW, LCSWA 12/24/2021  @ 1:38 AM

## 2021-12-25 DIAGNOSIS — F22 Delusional disorders: Secondary | ICD-10-CM

## 2021-12-25 LAB — RAPID URINE DRUG SCREEN, HOSP PERFORMED
Amphetamines: NOT DETECTED
Barbiturates: NOT DETECTED
Benzodiazepines: NOT DETECTED
Cocaine: NOT DETECTED
Opiates: NOT DETECTED
Tetrahydrocannabinol: NOT DETECTED

## 2021-12-25 LAB — RPR
RPR Ser Ql: REACTIVE — AB
RPR Titer: 1:2 {titer}

## 2021-12-25 NOTE — ED Notes (Signed)
Mikaela Coleman, NP at bedside to assess and update patient 

## 2021-12-25 NOTE — Progress Notes (Signed)
[facility] Progress Note  12/25/2021 3:37 PM Al CorpusRonald E Hackert  MRN:  [id number]   Subjective:   [facility] ED for face-to-face reevaluation.  Patient is pleasant and cooperative with assessment.  He denies any suicidal or homicidal ideations.  Denies any auditory or visual hallucinations.  Patient does still seem to be paranoid towards his family.  Specifically he is talking about his family giving him the wrong medications and seems unapologetic for any aggression he had at home.  It does appear patient has not been taking his medications correctly or being compliant.  His valproic acid level resulted at 18 ul/mg.  Depakote 500 mg twice daily was restarted.  It appears his family member reported he was not taking his medications, not attending to personal hygiene, not sleeping, and being aggressive towards family members.  Patient does have history of medication noncompliance accompanied by aggression towards family.  His father is his legal guardian and he does still live with him.  Patient has been compliant with medications since hospital admission.  Will continue to recommend inpatient psychiatric treatment at this time.  Patient has a reactive RPR blood work test.  Dr. Rodena MedinMessick notified. Per Dr. Lavada MesiMessicks note " Patient is unable to confirm prior diagnosis or treatment for syphilis.  T palladium antibody test is pending at the time of this note.  As per discussion with ID, Dr. Earlene PlaterWallace, no further action or treatment is required at this time pending results of T palladium antibody test.  Given low titers on RPR it is likely that the patient is not experiencing acute syphilis infection.  Per Dr. Earlene PlaterWallace, ID, would be happy to assist with treatment if T palladium antibody test is elevated."  Principal Problem: Paranoia (psychosis) (HCC) Diagnosis:  Principal Problem:   Paranoia (psychosis) (HCC) Active Problems:   Bipolar disorder (HCC)   Schizophrenia (HCC)   ED  Assessment Time Calculation: Start Time: 1300 Stop Time: 1320 Total Time in Minutes (Assessment Completion): 20   Past Psychiatric History:  See previous documentation  [place] Scale:  Flowsheet Row [facility] Most recent reading at 12/23/2021  2:33 PM [facility] Most recent reading at 12/23/2021  1:29 PM [facility] Most recent reading at 12/05/2021  1:15 PM  C-SSRS RISK CATEGORY No Risk No Risk No Risk       Past Medical History:  Past Medical History:  Diagnosis Date   COPD (chronic obstructive pulmonary disease) (HCC)    Hypertension    Schizophrenia (HCC)     Past Surgical History:  Procedure Laterality Date   LOWER EXTREMITY ANGIOGRAPHY N/A 09/18/2017   Procedure: LOWER EXTREMITY ANGIOGRAPHY- Recheck lysis;  Surgeon: Maeola Harmanain, Brandon Christopher, MD;  [facility];  Service: Cardiovascular;  Laterality: N/A;   PERIPHERAL VASCULAR INTERVENTION Left 09/18/2017   Procedure: PERIPHERAL VASCULAR INTERVENTION;  Surgeon: Maeola Harmanain, Brandon Christopher, MD;  [facility];  Service: Cardiovascular;  Laterality: Left;   THROMBECTOMY FEMORAL ARTERY Left 09/17/2017   Procedure: POSSIBLE THROMBECTOMY;  Surgeon: Maeola Harmanain, Brandon Christopher, MD;  [facility];  Service: Vascular;  Laterality: Left;   VENOGRAM Left 09/17/2017   Procedure: ULTRASOUND POPLITEAL ACCESS; CENTRAL VENOGRAM, IVVS, LYSIS CATHETER PLACEMENT;  Surgeon: Maeola Harmanain, Brandon Christopher, MD;  [facility];  Service: Vascular;  Laterality: Left;   Family History: History reviewed. No  pertinent family history.  Social History:  Social History   Substance and Sexual Activity  Alcohol Use No     Social History   Substance and Sexual Activity  Drug Use Never    Social History   Socioeconomic History   Marital status: Single     Spouse name: Not on file   Number of children: Not on file   Years of education: Not on file   Highest education level: Not on file  Occupational History   Not on file  Tobacco Use   Smoking status: Every Day    Packs/day: 0.50    Types: Cigarettes   Smokeless tobacco: Never  Vaping Use   Vaping Use: Never used  Substance and Sexual Activity   Alcohol use: No   Drug use: Never   Sexual activity: Not on file  Other Topics Concern   Not on file  Social History Narrative   Not on file   Social Determinants of Health   Financial Resource Strain: Not on file  Food Insecurity: Not on file  Transportation Needs: Not on file  Physical Activity: Not on file  Stress: Not on file  Social Connections: Not on file    Sleep: Fair  Appetite:  Good  Current Medications: Current Facility-Administered Medications  Medication Dose Route Frequency Provider Last Rate Last Admin   acetaminophen (TYLENOL) tablet 650 mg  650 mg Oral Q4H PRN Long, Arlyss Repress, MD       alum & mag hydroxide-simeth (MAALOX/MYLANTA) 200-200-20 MG/5ML suspension 30 mL  30 mL Oral Q6H PRN Long, Arlyss Repress, MD       amLODipine (NORVASC) tablet 5 mg  5 mg Oral Daily Long, Arlyss Repress, MD   5 mg at 12/25/21 1004   benztropine (COGENTIN) tablet 1 mg  1 mg Oral BID Leevy-Johnson, Brooke A, NP   1 mg at 12/25/21 1004   divalproex (DEPAKOTE ER) 24 hr tablet 500 mg  500 mg Oral BID Leevy-Johnson, Brooke A, NP   500 mg at 12/25/21 1005   gabapentin (NEURONTIN) capsule 400 mg  400 mg Oral TID Leevy-Johnson, Brooke A, NP   400 mg at 12/25/21 1004   haloperidol (HALDOL) tablet 5 mg  5 mg Oral BID Leevy-Johnson, Brooke A, NP   5 mg at 12/25/21 1003   risperiDONE (RISPERDAL M-TABS) disintegrating tablet 2 mg  2 mg Oral Q8H PRN Long, Arlyss Repress, MD       And   LORazepam (ATIVAN) tablet 1 mg  1 mg Oral PRN Long, Arlyss Repress, MD       And   ziprasidone (GEODON) injection 20 mg  20 mg Intramuscular PRN Long, Arlyss Repress, MD       nicotine  (NICODERM CQ - dosed in mg/24 hours) patch 21 mg  21 mg Transdermal Daily Long, Arlyss Repress, MD       ondansetron [facility]) tablet 4 mg  4 mg Oral Q8H PRN Long, Arlyss Repress, MD       propranolol (INDERAL) tablet 10 mg  10 mg Oral Daily Leevy-Johnson, Brooke A, NP   10 mg at 12/25/21 1003   traZODone (DESYREL) tablet 50 mg  50 mg Oral QHS Leevy-Johnson, Brooke A, NP   50 mg at 12/24/21 2246   [START ON 12/26/2021] Vitamin D (Ergocalciferol) (DRISDOL) 1.25 MG (50000 UNIT) capsule 50,000 Units  50,000 Units Oral Weekly Leevy-Johnson, Brooke A, NP       Current Outpatient Medications  Medication Sig Dispense Refill  amLODipine (NORVASC) 5 MG tablet Take 1 tablet (5 mg total) by mouth daily. 30 tablet 0   aspirin EC 81 MG tablet Take 81 mg by mouth daily. Swallow whole.     benztropine (COGENTIN) 1 MG tablet Take 1 tablet (1 mg total) by mouth 2 (two) times daily. 60 tablet 0   cloZAPine (CLOZARIL) 100 MG tablet Take 300 mg by mouth at bedtime.     cyclobenzaprine (FLEXERIL) 10 MG tablet Take 10 mg by mouth 3 (three) times daily as needed for muscle spasms.     divalproex (DEPAKOTE ER) 500 MG 24 hr tablet Take 1 tablet (500 mg total) by mouth 2 (two) times daily. 60 tablet 0   ergocalciferol (VITAMIN D2) 1.25 MG (50000 UT) capsule Take 50,000 Units by mouth once a week.     gabapentin (NEURONTIN) 100 MG capsule Take 100 mg by mouth daily.     gabapentin (NEURONTIN) 400 MG capsule Take 400 mg by mouth 3 (three) times daily.     gabapentin (NEURONTIN) 400 MG capsule Take 400 mg by mouth 3 (three) times daily.     haloperidol (HALDOL) 5 MG tablet Take 1 tablet (5 mg total) by mouth 2 (two) times daily. 60 tablet 0   hydrOXYzine (VISTARIL) 25 MG capsule Take 2 capsules by mouth 2 (two) times daily.     metoprolol tartrate (LOPRESSOR) 50 MG tablet Take 1 tablet (50 mg total) by mouth 2 (two) times daily. 60 tablet 0   omeprazole (PRILOSEC) 20 MG capsule Take 20 mg by mouth daily.     pantoprazole (PROTONIX) 20  MG tablet Take 1 tablet (20 mg total) by mouth daily. 30 tablet 0   propranolol (INDERAL) 10 MG tablet Take 1 tablet (10 mg total) by mouth daily. 30 tablet 0   traZODone (DESYREL) 50 MG tablet Take 50 mg by mouth at bedtime.      Lab Results:  Results for orders placed or performed during the hospital encounter of 12/23/21 (from the past 48 hour(s))  SARS Coronavirus 2 by RT PCR (hospital order, performed in [facility] hospital lab) *cepheid single result test* Anterior Nasal Swab     Status: None   Collection Time: 12/24/21  2:50 AM   Specimen: Anterior Nasal Swab  Result Value Ref Range   SARS Coronavirus 2 by RT PCR NEGATIVE NEGATIVE    Comment: (NOTE) SARS-CoV-2 target nucleic acids are NOT DETECTED.  The SARS-CoV-2 RNA is generally detectable in upper and lower respiratory specimens during the acute phase of infection. The lowest concentration of SARS-CoV-2 viral copies this assay can detect is 250 copies / mL. A negative result does not preclude SARS-CoV-2 infection and should not be used as the sole basis for treatment or other patient management decisions.  A negative result may occur with improper specimen collection / handling, submission of specimen other than nasopharyngeal swab, presence of viral mutation(s) within the areas targeted by this assay, and inadequate number of viral copies (<250 copies / mL). A negative result must be combined with clinical observations, patient history, and epidemiological information.  Fact Sheet for Patients:   RoadLapTop.co.za  Fact Sheet for Healthcare Providers: http://kim-miller.com/  This test is not yet approved or  cleared by the [place] FDA and has been authorized for detection and/or diagnosis of SARS-CoV-2 by FDA under an Emergency Use Authorization (EUA).  This EUA will remain in effect (meaning this test can be used) for the duration of the COVID-19 declaration under  Section  564(b)(1) of the Act, 21 U.S.C. section 360bbb-3(b)(1), unless the authorization is terminated or revoked sooner.  Performed at [facility] Lab, [address] [place], [place] [address]   RPR     Status: Abnormal   Collection Time: 12/24/21  6:11 PM  Result Value Ref Range   RPR Ser Ql Reactive (A) NON REACTIVE    Comment: SENT FOR CONFIRMATION   RPR Titer 1:2     Comment: Performed at [facility] Lab, [address] [place], [place] [address]  Rapid HIV screen (HIV 1/2 Ab+Ag) ([facility] Only)     Status: None   Collection Time: 12/24/21  6:11 PM  Result Value Ref Range   HIV-1 P24 Antigen - HIV24 NON REACTIVE NON REACTIVE    Comment: (NOTE) Detection of p24 may be inhibited by biotin in the sample, causing false negative results in acute infection.    HIV 1/2 Antibodies NON REACTIVE NON REACTIVE   Interpretation (HIV Ag Ab)      A non reactive test result means that HIV 1 or HIV 2 antibodies and HIV 1 p24 antigen were not detected in the specimen.    Comment: Performed at [facility] Lab, [address], [place], [place] [address]  Valproic acid level     Status: Abnormal   Collection Time: 12/24/21  6:17 PM  Result Value Ref Range   Valproic Acid Lvl 18 (L) 50.0 - 100.0 ug/mL    Comment: Performed at [facility] Lab, [address] [place], [place] [address]  HIV Antibody (routine testing w rflx)     Status: None   Collection Time: 12/24/21  6:17 PM  Result Value Ref Range   HIV Screen 4th Generation wRfx Non Reactive Non Reactive    Comment: Performed at [facility] Lab, [address] [place], [place] [address]  Rapid urine drug screen (hospital performed)     Status: None   Collection Time: 12/24/21 11:15 PM  Result Value Ref Range   Opiates NONE DETECTED NONE DETECTED   Cocaine NONE DETECTED NONE DETECTED   Benzodiazepines NONE DETECTED NONE DETECTED   Amphetamines NONE DETECTED NONE DETECTED   Tetrahydrocannabinol NONE DETECTED NONE DETECTED    Barbiturates NONE DETECTED NONE DETECTED    Comment: (NOTE) DRUG SCREEN FOR MEDICAL PURPOSES ONLY.  IF CONFIRMATION IS NEEDED FOR ANY PURPOSE, NOTIFY LAB WITHIN 5 DAYS.  LOWEST DETECTABLE LIMITS FOR URINE DRUG SCREEN Drug Class                     Cutoff (ng/mL) Amphetamine and metabolites    1000 Barbiturate and metabolites    200 Benzodiazepine                 200 Tricyclics and metabolites     300 Opiates and metabolites        300 Cocaine and metabolites        300 THC                            50 Performed at [facility] Lab, [address] [place], [place] [address]   Urinalysis, Routine w reflex microscopic Urine, Clean Catch     Status: None   Collection Time: 12/24/21 11:15 PM  Result Value Ref Range   Color, Urine YELLOW YELLOW   APPearance CLEAR CLEAR   Specific Gravity, Urine 1.017 1.005 - 1.030   pH 6.0  5.0 - 8.0   Glucose, UA NEGATIVE NEGATIVE mg/dL   Hgb urine dipstick NEGATIVE NEGATIVE   Bilirubin Urine NEGATIVE NEGATIVE   Ketones, ur NEGATIVE NEGATIVE mg/dL   Protein, ur NEGATIVE NEGATIVE mg/dL   Nitrite NEGATIVE NEGATIVE   Leukocytes,Ua NEGATIVE NEGATIVE    Comment: Performed at [facility] Lab, [address], [place], [place] [address]    Blood Alcohol level:  Lab Results  Component Value Date   [facility] <10 12/03/2021   ETH <10 11/24/2021     Psychiatric Specialty Exam:  Presentation  General Appearance: Fairly Groomed  Eye Contact:Good  Speech:Garbled  Speech Volume:Normal  Handedness:Right   Mood and Affect  Mood:Euthymic  Affect:Congruent   Thought Process  Thought Processes:Coherent  Descriptions of Associations:Intact  Orientation:Full (Time, Place and Person)  Thought Content:Logical  History of Schizophrenia/Schizoaffective disorder:Yes  Duration of Psychotic Symptoms:Greater than six months  Hallucinations:Hallucinations: None  Ideas of Reference:Paranoia  Suicidal Thoughts:Suicidal Thoughts:  No  Homicidal Thoughts:Homicidal Thoughts: No   Sensorium  Memory:Immediate Good; Recent Fair  Judgment:Fair  Insight:Fair   Executive Functions  Concentration:Fair  Attention Span:Fair  Recall:Good  Fund of Knowledge:Good  Language:Good   Psychomotor Activity  Psychomotor Activity:Psychomotor Activity: Tremor   Assets  Assets:Communication Skills; Physical Health; Resilience; Social Support   Sleep  Sleep:Sleep: Fair    Physical Exam: Physical Exam Neurological:     Mental Status: He is alert and oriented to person, place, and time.    Review of Systems  Neurological:  Positive for tremors.  Psychiatric/Behavioral:  The patient has insomnia.        Paranoia  All other systems reviewed and are negative.  Blood pressure 122/69, pulse 76, temperature 98.5 F (36.9 C), temperature source Oral, resp. rate 18, SpO2 97 %. There is no height or weight on file to calculate BMI.   Medical Decision Making: Patient case reviewed and discussed with Dr. Lucianne Muss.  Will continue to recommend inpatient psychiatric treatment.  EDP, RN, LCSW notified of disposition.  - No medication changes at this time  Eligha Bridegroom, NP 12/25/2021, 3:37 PM

## 2021-12-25 NOTE — Progress Notes (Signed)
CSW followed up with [facility] in reference to referral sent for review. It was reported by Sue Lush, admissions, that the facility is currently at capacity.   Crissie Reese, MSW, Lenice Pressman Phone: [phone] Disposition/TOC

## 2021-12-25 NOTE — ED Notes (Signed)
Pt received lunch tray 

## 2021-12-25 NOTE — ED Notes (Signed)
Patient sitting upright in bed eating breakfast. Patient expresses no needs at this time. 

## 2021-12-25 NOTE — ED Provider Notes (Signed)
CSW spoke with Britta Mccreedy with [facility] in reference to a referral sent to be review for inpatient placement. A blood alcohol level and IVC paperwork was requested for be faxed to [phone] for further review. CSW reached out to treatment team.     Crissie Reese, MSW, LCSW-A, LCAS Phone: [phone] Disposition/TOC

## 2021-12-25 NOTE — Progress Notes (Signed)
Per Doran Heater, NP, patient meets criteria for inpatient treatment. There are no available or appropriate beds at [facility] today. CSW re-faxed referrals.   TTS will continue to seek bed placement.  Crissie Reese, MSW, Lenice Pressman Phone: [phone] Disposition/TOC

## 2021-12-25 NOTE — ED Notes (Signed)
Dinner tray arrived 

## 2021-12-25 NOTE — ED Provider Notes (Signed)
Emergency Medicine Observation Re-evaluation Note  Mark [name] is a 61 y.o. male, seen on rounds today.  Pt initially presented to the ED for complaints of IVC  Currently, the patient is resting comfortably.  Physical Exam  BP 122/69   Pulse 76   Temp 98.5 F (36.9 C) (Oral)   Resp 18   SpO2 97%  Physical Exam General: NAD   ED Course / MDM  EKG:   I have reviewed the labs performed to date as well as medications administered while in observation.  Recent changes in the last 24 hours include no acute events reported.  Plan  Current plan is for placement. Mark [name] is under involuntary commitment.  Additionally, patient with screening labs ordered by psychiatry NP.  Of these labs patient's RPR was positive.  Patient is unable to confirm prior diagnosis or treatment for syphilis.  T palladium antibody test is pending at the time of this note.  As per discussion with ID, Dr. Earlene Plater, no further action or treatment is required at this time pending results of T palladium antibody test.  Given low titers on RPR it is likely that the patient is not experiencing acute syphilis infection.  Per Dr. Earlene Plater, ID, would be happy to assist with treatment if T palladium antibody test is elevated.      Mark Fines, MD 12/25/21 1504

## 2021-12-26 MED ORDER — OLANZAPINE 10 MG PO TABS
10.0000 mg | ORAL_TABLET | Freq: Every day | ORAL | Status: DC
  Administered 2021-12-26: 10 mg via ORAL
  Filled 2021-12-26: qty 1

## 2021-12-26 NOTE — Progress Notes (Signed)
[facility] Psych ED Progress Note  12/26/2021 10:32 AM Mark [name]  MRN:  [id number]   Subjective:   Patient seen this morning in his room at [facility] ED for face-to-face reevaluation.  He tells me he slept better last night and feels good this morning.  He denies any suicidal or homicidal ideations.  He denies any auditory or visual hallucinations.  However his nurse reported that he was seen responding to internal stimuli while in his room alone around 0830 this morning.  He does still make some paranoid statements when prompted and I ask about his family.  He still feels like his family purposely does not give him the correct medication, but he does not overly ruminate on this today.  Bilateral tremors of upper and lower extremities that I noticed yesterday seem to have improved some today.  Haldol was discontinued, and we will start Zyprexa at bedtime due to lower risk of contributing to EPS.  We will continue Depakote 500 twice daily, as this was his previous dose being taken and resulted in therapeutic levels.  I attempted to call his dad/legal guardian, Mark [name], at [phone].  However no response.  We will continue recommendation of inpatient psychiatric treatment.  CSW notified as there is no [facility] availability.  Patient has been faxed out and is under review.  Principal Problem: Paranoia (psychosis) (HCC) Diagnosis:  Principal Problem:   Paranoia (psychosis) (HCC) Active Problems:   Bipolar disorder (HCC)   Schizophrenia (HCC)   ED Assessment Time Calculation: Start Time: 0930 Stop Time: 0950 Total Time in Minutes (Assessment Completion): 20   Past Psychiatric History:  See previous documentation  [place] Scale:  Flowsheet Row ED from 12/23/2021 in [facility] EMERGENCY DEPARTMENT Most recent reading at 12/23/2021  2:33 PM ED from 12/23/2021 in [facility] Most recent reading at 12/23/2021  1:29 PM ED from 12/03/2021 in [facility] EMERGENCY DEPARTMENT Most recent reading at 12/05/2021  1:15 PM  C-SSRS RISK CATEGORY No Risk No Risk No Risk       Past Medical History:  Past Medical History:  Diagnosis Date   COPD (chronic obstructive pulmonary disease) (HCC)    Hypertension    Schizophrenia (HCC)     Past Surgical History:  Procedure Laterality Date   LOWER EXTREMITY ANGIOGRAPHY N/A 09/18/2017   Procedure: LOWER EXTREMITY ANGIOGRAPHY- Recheck lysis;  Surgeon: Maeola Harman, MD;  Location: [facility] INVASIVE CV LAB;  Service: Cardiovascular;  Laterality: N/A;   PERIPHERAL VASCULAR INTERVENTION Left 09/18/2017   Procedure: PERIPHERAL VASCULAR INTERVENTION;  Surgeon: Maeola Harman, MD;  Location: [facility] INVASIVE CV LAB;  Service: Cardiovascular;  Laterality: Left;   THROMBECTOMY FEMORAL ARTERY Left 09/17/2017   Procedure: POSSIBLE THROMBECTOMY;  Surgeon: Maeola Harman, MD;  Location[facility] OR;  Service: Vascular;  Laterality: Left;   VENOGRAM Left 09/17/2017   Procedure: ULTRASOUND POPLITEAL ACCESS; CENTRAL VENOGRAM, IVVS, LYSIS CATHETER PLACEMENT;  Surgeon: Maeola Harman, MD;  Location: [facility] OR;  Service: Vascular;  Laterality: Left;   Family History: History reviewed. No pertinent family history.  Social History:  Social History   Substance and Sexual Activity  Alcohol Use No     Social History   Substance and Sexual Activity  Drug Use Never    Social History   Socioeconomic History   Marital status: Single    Spouse name: Not on file   Number of children: Not on file   Years of education: Not on  file   Highest education level: Not on file  Occupational History   Not on file  Tobacco Use   Smoking status: Every Day    Packs/day: 0.50    Types: Cigarettes   Smokeless tobacco: Never  Vaping Use   Vaping Use: Never used  Substance and Sexual Activity   Alcohol use: No   Drug use: Never   Sexual activity: Not on file  Other Topics Concern   Not  on file  Social History Narrative   Not on file   Social Determinants of Health   Financial Resource Strain: Not on file  Food Insecurity: Not on file  Transportation Needs: Not on file  Physical Activity: Not on file  Stress: Not on file  Social Connections: Not on file    Sleep: Good  Appetite:  Good  Current Medications: Current Facility-Administered Medications  Medication Dose Route Frequency Provider Last Rate Last Admin   acetaminophen (TYLENOL) tablet 650 mg  650 mg Oral Q4H PRN Long, Arlyss Repress, MD       alum & mag hydroxide-simeth (MAALOX/MYLANTA) 200-200-20 MG/5ML suspension 30 mL  30 mL Oral Q6H PRN Long, Arlyss Repress, MD       amLODipine (NORVASC) tablet 5 mg  5 mg Oral Daily Long, Arlyss Repress, MD   5 mg at 12/26/21 5400   benztropine (COGENTIN) tablet 1 mg  1 mg Oral BID Leevy-Johnson, Brooke A, NP   1 mg at 12/26/21 8676   divalproex (DEPAKOTE ER) 24 hr tablet 500 mg  500 mg Oral BID Leevy-Johnson, Brooke A, NP   500 mg at 12/26/21 1950   gabapentin (NEURONTIN) capsule 400 mg  400 mg Oral TID Leevy-Johnson, Brooke A, NP   400 mg at 12/26/21 9326   risperiDONE (RISPERDAL M-TABS) disintegrating tablet 2 mg  2 mg Oral Q8H PRN Long, Arlyss Repress, MD       And   LORazepam (ATIVAN) tablet 1 mg  1 mg Oral PRN Long, Arlyss Repress, MD       And   ziprasidone (GEODON) injection 20 mg  20 mg Intramuscular PRN Long, Arlyss Repress, MD       nicotine (NICODERM CQ - dosed in mg/24 hours) patch 21 mg  21 mg Transdermal Daily Long, Arlyss Repress, MD       OLANZapine (ZYPREXA) tablet 10 mg  10 mg Oral QHS Eligha Bridegroom, NP       ondansetron (ZOFRAN) tablet 4 mg  4 mg Oral Q8H PRN Long, Arlyss Repress, MD       propranolol (INDERAL) tablet 10 mg  10 mg Oral Daily Leevy-Johnson, Brooke A, NP   10 mg at 12/26/21 7124   traZODone (DESYREL) tablet 50 mg  50 mg Oral QHS Leevy-Johnson, Brooke A, NP   50 mg at 12/25/21 2234   Vitamin D (Ergocalciferol) (DRISDOL) 1.25 MG (50000 UNIT) capsule 50,000 Units  50,000 Units  Oral Weekly Leevy-Johnson, Brooke A, NP   50,000 Units at 12/26/21 5809   Current Outpatient Medications  Medication Sig Dispense Refill   amLODipine (NORVASC) 5 MG tablet Take 1 tablet (5 mg total) by mouth daily. 30 tablet 0   aspirin EC 81 MG tablet Take 81 mg by mouth daily. Swallow whole.     benztropine (COGENTIN) 1 MG tablet Take 1 tablet (1 mg total) by mouth 2 (two) times daily. 60 tablet 0   cloZAPine (CLOZARIL) 100 MG tablet Take 300 mg by mouth at bedtime.     cyclobenzaprine (FLEXERIL)  10 MG tablet Take 10 mg by mouth 3 (three) times daily as needed for muscle spasms.     divalproex (DEPAKOTE ER) 500 MG 24 hr tablet Take 1 tablet (500 mg total) by mouth 2 (two) times daily. 60 tablet 0   ergocalciferol (VITAMIN D2) 1.25 MG (50000 UT) capsule Take 50,000 Units by mouth once a week.     gabapentin (NEURONTIN) 100 MG capsule Take 100 mg by mouth daily.     gabapentin (NEURONTIN) 400 MG capsule Take 400 mg by mouth 3 (three) times daily.     gabapentin (NEURONTIN) 400 MG capsule Take 400 mg by mouth 3 (three) times daily.     haloperidol (HALDOL) 5 MG tablet Take 1 tablet (5 mg total) by mouth 2 (two) times daily. 60 tablet 0   hydrOXYzine (VISTARIL) 25 MG capsule Take 2 capsules by mouth 2 (two) times daily.     metoprolol tartrate (LOPRESSOR) 50 MG tablet Take 1 tablet (50 mg total) by mouth 2 (two) times daily. 60 tablet 0   omeprazole (PRILOSEC) 20 MG capsule Take 20 mg by mouth daily.     pantoprazole (PROTONIX) 20 MG tablet Take 1 tablet (20 mg total) by mouth daily. 30 tablet 0   propranolol (INDERAL) 10 MG tablet Take 1 tablet (10 mg total) by mouth daily. 30 tablet 0   traZODone (DESYREL) 50 MG tablet Take 50 mg by mouth at bedtime.      Lab Results:  Results for orders placed or performed during the hospital encounter of 12/23/21 (from the past 48 hour(s))  RPR     Status: Abnormal   Collection Time: 12/24/21  6:11 PM  Result Value Ref Range   RPR Ser Ql Reactive (A)  NON REACTIVE    Comment: SENT FOR CONFIRMATION   RPR Titer 1:2     Comment: Performed at [facility] Lab, [address] [place], [place] [address]  Rapid HIV screen (HIV 1/2 Ab+Ag) ([facility] Only)     Status: None   Collection Time: 12/24/21  6:11 PM  Result Value Ref Range   HIV-1 P24 Antigen - HIV24 NON REACTIVE NON REACTIVE    Comment: (NOTE) Detection of p24 may be inhibited by biotin in the sample, causing false negative results in acute infection.    HIV 1/2 Antibodies NON REACTIVE NON REACTIVE   Interpretation (HIV Ag Ab)      A non reactive test result means that HIV 1 or HIV 2 antibodies and HIV 1 p24 antigen were not detected in the specimen.    Comment: Performed at [facility] Lab, [address], [place], [place] [address]  Valproic acid level     Status: Abnormal   Collection Time: 12/24/21  6:17 PM  Result Value Ref Range   Valproic Acid Lvl 18 (L) 50.0 - 100.0 ug/mL    Comment: Performed at [facility] Lab, [address], [place], [place] [address]  HIV Antibody (routine testing w rflx)     Status: None   Collection Time: 12/24/21  6:17 PM  Result Value Ref Range   HIV Screen 4th Generation wRfx Non Reactive Non Reactive    Comment: Performed at [facility] Lab, [address] [place], [place] [address]  Rapid urine drug screen (hospital performed)     Status: None   Collection Time: 12/24/21 11:15 PM  Result Value Ref Range   Opiates NONE DETECTED NONE DETECTED   Cocaine NONE DETECTED NONE DETECTED  Benzodiazepines NONE DETECTED NONE DETECTED   Amphetamines NONE DETECTED NONE DETECTED   Tetrahydrocannabinol NONE DETECTED NONE DETECTED   Barbiturates NONE DETECTED NONE DETECTED    Comment: (NOTE) DRUG SCREEN FOR MEDICAL PURPOSES ONLY.  IF CONFIRMATION IS NEEDED FOR ANY PURPOSE, NOTIFY LAB WITHIN 5 DAYS.  LOWEST DETECTABLE LIMITS FOR URINE DRUG SCREEN Drug Class                     Cutoff (ng/mL) Amphetamine and metabolites    1000 Barbiturate  and metabolites    200 Benzodiazepine                 200 Tricyclics and metabolites     300 Opiates and metabolites        300 Cocaine and metabolites        300 THC                            50 Performed at [facility] Lab, [address], [place], [place] [address]   Urinalysis, Routine w reflex microscopic Urine, Clean Catch     Status: None   Collection Time: 12/24/21 11:15 PM  Result Value Ref Range   Color, Urine YELLOW YELLOW   APPearance CLEAR CLEAR   Specific Gravity, Urine 1.017 1.005 - 1.030   pH 6.0 5.0 - 8.0   Glucose, UA NEGATIVE NEGATIVE mg/dL   Hgb urine dipstick NEGATIVE NEGATIVE   Bilirubin Urine NEGATIVE NEGATIVE   Ketones, ur NEGATIVE NEGATIVE mg/dL   Protein, ur NEGATIVE NEGATIVE mg/dL   Nitrite NEGATIVE NEGATIVE   Leukocytes,Ua NEGATIVE NEGATIVE    Comment: Performed at [facility] Lab, [address], [place], [place] [address]    Blood Alcohol level:  Lab Results  Component Value Date   [facility] <10 12/03/2021   ETH <10 11/24/2021    Psychiatric Specialty Exam:  Presentation  General Appearance: Fairly Groomed  Eye Contact:Good  Speech:Garbled (improved)  Speech Volume:Normal  Handedness:Right   Mood and Affect  Mood:Euthymic  Affect:Congruent   Thought Process  Thought Processes:Coherent  Descriptions of Associations:Intact  Orientation:Full (Time, Place and Person)  Thought Content:Logical  History of Schizophrenia/Schizoaffective disorder:No  Duration of Psychotic Symptoms:Greater than six months  Hallucinations:Hallucinations: None  Ideas of Reference:None  Suicidal Thoughts:Suicidal Thoughts: No  Homicidal Thoughts:Homicidal Thoughts: No   Sensorium  Memory:Immediate Fair  Judgment:Fair  Insight:Fair   Executive Functions  Concentration:Fair  Attention Span:Fair  Recall:Good  Fund of Knowledge:Good  Language:Good   Psychomotor Activity  Psychomotor Activity:Psychomotor Activity:  Tremor   Assets  Assets:Communication Skills; Desire for Improvement; Physical Health   Sleep  Sleep:Sleep: Fair    Physical Exam: Physical Exam Neurological:     Mental Status: He is alert and oriented to person, place, and time.    Review of Systems  Neurological:  Positive for tremors.  Psychiatric/Behavioral:         Ruminations/paranoia of family  All other systems reviewed and are negative.  Blood pressure 117/70, pulse 70, temperature 98.2 F (36.8 C), temperature source Oral, resp. rate 17, SpO2 96 %. There is no height or weight on file to calculate BMI.   Medical Decision Making: Patient case reviewed and discussed with Dr. Lucianne Muss.  Will continue recommendation for inpatient psychiatric treatment.  EDP, RN, LCSW notified of disposition.   Eligha Bridegroom, NP 12/26/2021, 10:32 AM

## 2021-12-26 NOTE — ED Provider Notes (Signed)
Emergency Medicine Observation Re-evaluation Note  Mark [name] is a 61 y.o. male.  Pt initially presented to the ED for complaints of IVC  Currently, the patient is resting calmly.  Physical Exam  BP 117/70 (BP Location: Left Arm)   Pulse 70   Temp 98.2 F (36.8 C) (Oral)   Resp 17   SpO2 96%  Physical Exam General: NAD Cardiac: regular rate Lungs: normal effort   ED Course / MDM  EKG:   I have reviewed the labs performed to date as well as medications administered while in observation.  Recent changes in the last 24 hours include initial rpr was positive.  T pallidum ab is pending.  Plan  Current plan is for inpt tx.  Medically, waiting for t pallidum ab test.  ID consulted.  Plan to consult with ID if treponema ab test is positive. Mark [name] is under involuntary commitment.      Linwood Dibbles, MD 12/26/21 [phone]

## 2021-12-26 NOTE — Progress Notes (Signed)
CSW spoke with [facility] admissions. It was reported that patient is under review.  Crissie Reese, MSW, Lenice Pressman Phone: [phone] Disposition/TOC

## 2021-12-26 NOTE — ED Notes (Signed)
Pt in room reacting to internal stimuli. Denies any SI/HI at this time. Pt ambulated to restroom with a steady gait.

## 2021-12-26 NOTE — ED Notes (Signed)
Reviewed IVC docs; expiration 12/30/21

## 2021-12-26 NOTE — ED Notes (Signed)
Breakfast order placed ?

## 2021-12-27 ENCOUNTER — Other Ambulatory Visit: Payer: Self-pay

## 2021-12-27 ENCOUNTER — Encounter (HOSPITAL_COMMUNITY): Payer: Self-pay | Admitting: Nurse Practitioner

## 2021-12-27 ENCOUNTER — Inpatient Hospital Stay (HOSPITAL_COMMUNITY)
Admission: AD | Admit: 2021-12-27 | Discharge: 2022-01-05 | DRG: 885 | Disposition: A | Discharge status: Home / Self Care | Payer: Medicaid Other | Source: Intra-hospital | Attending: Psychiatry | Admitting: Psychiatry

## 2021-12-27 DIAGNOSIS — I10 Essential (primary) hypertension: Secondary | ICD-10-CM | POA: Diagnosis present

## 2021-12-27 DIAGNOSIS — E559 Vitamin D deficiency, unspecified: Secondary | ICD-10-CM | POA: Diagnosis present

## 2021-12-27 DIAGNOSIS — F29 Unspecified psychosis not due to a substance or known physiological condition: Principal | ICD-10-CM | POA: Insufficient documentation

## 2021-12-27 DIAGNOSIS — F17210 Nicotine dependence, cigarettes, uncomplicated: Secondary | ICD-10-CM | POA: Diagnosis present

## 2021-12-27 DIAGNOSIS — Z7982 Long term (current) use of aspirin: Secondary | ICD-10-CM

## 2021-12-27 DIAGNOSIS — Z560 Unemployment, unspecified: Secondary | ICD-10-CM | POA: Diagnosis not present

## 2021-12-27 DIAGNOSIS — J449 Chronic obstructive pulmonary disease, unspecified: Secondary | ICD-10-CM | POA: Diagnosis present

## 2021-12-27 DIAGNOSIS — F200 Paranoid schizophrenia: Secondary | ICD-10-CM | POA: Diagnosis present

## 2021-12-27 DIAGNOSIS — Z79899 Other long term (current) drug therapy: Secondary | ICD-10-CM | POA: Diagnosis not present

## 2021-12-27 DIAGNOSIS — Z91199 Patient's noncompliance with other medical treatment and regimen due to unspecified reason: Secondary | ICD-10-CM

## 2021-12-27 DIAGNOSIS — Z91148 Patient's other noncompliance with medication regimen for other reason: Secondary | ICD-10-CM | POA: Diagnosis not present

## 2021-12-27 DIAGNOSIS — F209 Schizophrenia, unspecified: Principal | ICD-10-CM | POA: Diagnosis present

## 2021-12-27 DIAGNOSIS — G2401 Drug induced subacute dyskinesia: Secondary | ICD-10-CM

## 2021-12-27 DIAGNOSIS — F22 Delusional disorders: Secondary | ICD-10-CM | POA: Diagnosis not present

## 2021-12-27 LAB — T.PALLIDUM AB, TOTAL: T Pallidum Abs: NONREACTIVE

## 2021-12-27 MED ORDER — ZIPRASIDONE MESYLATE 20 MG IM SOLR: 20.0000 mg | INTRAMUSCULAR | Status: DC | PRN

## 2021-12-27 MED ORDER — VITAMIN D (ERGOCALCIFEROL) 1.25 MG (50000 UNIT) PO CAPS
50000.0000 [IU] | ORAL_CAPSULE | ORAL | Status: DC
  Administered 2022-01-02: 50000 [IU] via ORAL
  Filled 2021-12-27: qty 1

## 2021-12-27 MED ORDER — ACETAMINOPHEN 325 MG PO TABS
650.0000 mg | ORAL_TABLET | ORAL | Status: DC | PRN
  Administered 2021-12-28 – 2022-01-03 (×5): 650 mg via ORAL
  Filled 2021-12-27 (×5): qty 2

## 2021-12-27 MED ORDER — TRAZODONE HCL 50 MG PO TABS
50.0000 mg | ORAL_TABLET | Freq: Every day | ORAL | Status: DC
  Administered 2021-12-27 – 2021-12-28 (×2): 50 mg via ORAL
  Filled 2021-12-27 (×4): qty 1

## 2021-12-27 MED ORDER — ALUM & MAG HYDROXIDE-SIMETH 200-200-20 MG/5ML PO SUSP: 30.0000 mL | Freq: Four times a day (QID) | ORAL | Status: DC | PRN

## 2021-12-27 MED ORDER — PROPRANOLOL HCL 10 MG PO TABS
10.0000 mg | ORAL_TABLET | Freq: Every day | ORAL | Status: DC
  Administered 2021-12-28 – 2021-12-29 (×2): 10 mg via ORAL
  Filled 2021-12-27 (×3): qty 1

## 2021-12-27 MED ORDER — BENZTROPINE MESYLATE 1 MG PO TABS
1.0000 mg | ORAL_TABLET | Freq: Two times a day (BID) | ORAL | Status: DC
  Administered 2021-12-27 – 2022-01-05 (×18): 1 mg via ORAL
  Filled 2021-12-27 (×24): qty 1

## 2021-12-27 MED ORDER — AMLODIPINE BESYLATE 5 MG PO TABS
5.0000 mg | ORAL_TABLET | Freq: Every day | ORAL | Status: DC
  Administered 2021-12-28 – 2022-01-04 (×8): 5 mg via ORAL
  Filled 2021-12-27 (×10): qty 1

## 2021-12-27 MED ORDER — NICOTINE 21 MG/24HR TD PT24
21.0000 mg | MEDICATED_PATCH | Freq: Every day | TRANSDERMAL | Status: DC
  Filled 2021-12-27 (×5): qty 1

## 2021-12-27 MED ORDER — DIVALPROEX SODIUM ER 500 MG PO TB24
500.0000 mg | ORAL_TABLET | Freq: Two times a day (BID) | ORAL | Status: DC
  Administered 2021-12-27 – 2022-01-05 (×18): 500 mg via ORAL
  Filled 2021-12-27 (×23): qty 1

## 2021-12-27 MED ORDER — OLANZAPINE 10 MG PO TABS
10.0000 mg | ORAL_TABLET | Freq: Every day | ORAL | Status: DC
  Administered 2021-12-27: 10 mg via ORAL
  Filled 2021-12-27 (×4): qty 1

## 2021-12-27 MED ORDER — LORAZEPAM 1 MG PO TABS
1.0000 mg | ORAL_TABLET | ORAL | Status: DC | PRN
  Filled 2021-12-27: qty 1

## 2021-12-27 MED ORDER — MAGNESIUM HYDROXIDE 400 MG/5ML PO SUSP: 30.0000 mL | Freq: Every day | ORAL | Status: DC | PRN

## 2021-12-27 MED ORDER — RISPERIDONE 2 MG PO TBDP: 2.0000 mg | ORAL_TABLET | Freq: Three times a day (TID) | ORAL | Status: DC | PRN

## 2021-12-27 MED ORDER — GABAPENTIN 400 MG PO CAPS
400.0000 mg | ORAL_CAPSULE | Freq: Three times a day (TID) | ORAL | Status: DC
  Administered 2021-12-28 – 2022-01-05 (×26): 400 mg via ORAL
  Filled 2021-12-27 (×32): qty 1

## 2021-12-27 MED ORDER — ONDANSETRON HCL 4 MG PO TABS: 4.0000 mg | ORAL_TABLET | Freq: Three times a day (TID) | ORAL | Status: DC | PRN

## 2021-12-27 NOTE — ED Notes (Signed)
[facility] here to transport pt at this time to [facility] inpatient, pt A&O x4 and ambulatory

## 2021-12-27 NOTE — ED Notes (Signed)
Mildly pleasantly agitated. Pacing, talking and laughing to self.

## 2021-12-27 NOTE — BH Assessment (Addendum)
Per the [facility] Debbe Bales, RN), patient will be accepted to [facility] later today, time of transfer pending and bed assignment pending.  Requested patient's nurse to fax a copy of the IVC to [facility] for review.

## 2021-12-27 NOTE — ED Notes (Signed)
Increased agitation, pacing room, talking to self

## 2021-12-27 NOTE — ED Notes (Signed)
IVC faxed to [facility]

## 2021-12-27 NOTE — Tx Team (Signed)
Initial Treatment Plan 12/27/2021 11:58 PM Mark [name] KDX:[id number]    PATIENT STRESSORS: Marital or family conflict   Medication change or noncompliance     PATIENT STRENGTHS: Motivation for treatment/growth  Supportive family/friends    PATIENT IDENTIFIED PROBLEMS: Schizophrenia  Psychosis      "Help getting medications right"  "Help with home care services"  "Help with some decent food"         DISCHARGE CRITERIA:  Adequate post-discharge living arrangements Improved stabilization in mood, thinking, and/or behavior  PRELIMINARY DISCHARGE PLAN: Outpatient therapy  PATIENT/FAMILY INVOLVEMENT: This treatment plan has been presented to and reviewed with the patient, Mark [name].  The patient and family have been given the opportunity to ask questions and make suggestions.  Marcie Bal, RN 12/27/2021, 11:58 PM

## 2021-12-27 NOTE — Tx Team (Incomplete)
Initial Treatment Plan 12/27/2021 11:58 PM Mark [name] [id number]    PATIENT STRESSORS: Marital or family conflict   Medication change or noncompliance     PATIENT STRENGTHS: Motivation for treatment/growth  Supportive family/friends    PATIENT IDENTIFIED PROBLEMS:                      DISCHARGE CRITERIA:  Adequate post-discharge living arrangements Improved stabilization in mood, thinking, and/or behavior  PRELIMINARY DISCHARGE PLAN: Outpatient therapy  PATIENT/FAMILY INVOLVEMENT: This treatment plan has been presented to and reviewed with the patient, Mark [name].  The patient and family have been given the opportunity to ask questions and make suggestions.  Marcie Bal, RN 12/27/2021, 11:58 PM

## 2021-12-27 NOTE — BH Assessment (Addendum)
Patient continues to meet criteria for inpatient psychiatric treatment. The provider Glynda Jaeger, NP) has requested the [facility] Debbe Bales, NP) to review patient for a bed admission to [facility]. Referrals have also been faxed out to other hospitals for consideration of bed placement in the event that [facility] does not have any bed availability. See hospitals listed below.    Destination Service Provider  Selected Services Address Phone Fax Patient Preferred  [facility]   N/A [address] [place] [address] [phone] --  [facility] [address]   N/A [address] [place] [address] [phone] --  [facility]   N/A [address], [place] [address] [phone] --  [facility]   N/A [address] [place] [address] [phone] --  [facility]   N/A [address] [place] [address] [phone] --  [facility]   N/A [address] [place] [address] [phone] --  [facility]   N/A [address] [place] [address] [phone] --  [facility]   N/A [address] [place], [place] [address] [phone] --  [facility]   N/A [address] [place] [address] [phone] --  [facility]   N/A [address], [place] [address] [phone] --  [facility]   N/A [address] [place] [address] [phone] --  [facility]   N/A [address] [place] [address] [phone] --  [facility]   N/A [address] [place], [place] [address] [phone] --  [facility]   N/A [address]  [place] [address] [phone] --  [facility]   N/A [address] [place], [place] [address] [phone] --  [facility]   N/A [address] [place] [address] [phone] --  [facility]   N/A [address], [place] [address] [phone] --  [facility]   N/A [address] [place] [address] [phone] --  [facility]   N/A [address] [place], [place] [address] [phone] --  [facility]   N/A [address] [place] [address] [phone] --  [facility]   N/A [address] [place] [address] [phone] --  [facility]   N/A [phone] [place] [address] [phone] --  [facility]   N/A [address] [place], [place] [address] [phone] --  [facility]   N/A [address], [place] [address] [phone] --  [facility]   N/A [address] [place] [address] [phone] --  [facility]   N/A [address] [place] [address] [phone] --  [facility]   N/A [address], [place] [address] [phone] --  [facility]   N/A [address], [place] [address] [phone] --  [facility]   N/A [address] [place] [address] [phone] --  [facility]   N/A [address] [place] [address] [phone] --  [facility]   N/A [address] [place]  [address] [phone] --  [facility]   N/A [address],  [place] [address] [phone] --  [facility]   N/A [address] [place] [address] [phone] --  [facility]   N/A [address], [place] [address] [phone]

## 2021-12-27 NOTE — ED Notes (Signed)
Psych at BS

## 2021-12-27 NOTE — BH Assessment (Signed)
Patient accepted to 500-1 to the services of Dr. Sherron Flemings and may arrive anytime after 5 pm. Nurse report [phone]. Patient's nurse Christiane Ha, RN, provided disposition updates.

## 2021-12-27 NOTE — ED Notes (Signed)
Breakfast order placed ?

## 2021-12-27 NOTE — ED Notes (Signed)
No change, pending transport.

## 2021-12-27 NOTE — Plan of Care (Signed)
  Problem: Education: Goal: Emotional status will improve Outcome: Not Progressing Goal: Mental status will improve Outcome: Not Progressing Goal: Verbalization of understanding the information provided will improve Outcome: Not Progressing   

## 2021-12-27 NOTE — ED Notes (Signed)
[facility] called to transport [facility] to [facility] 

## 2021-12-27 NOTE — ED Notes (Signed)
Preparing for d/c, pt is IVC, will go by [employer]. Report called.

## 2021-12-27 NOTE — ED Notes (Addendum)
[facility] transport to [facility] initiated. Pending arrival. Belongings and valuables $7 gathered, will turn over to [facility] for transport.

## 2021-12-27 NOTE — ED Provider Notes (Addendum)
Emergency Medicine Observation Re-evaluation Note  Mark [name] is a 61 y.o. male, seen on rounds today.  Pt initially presented to the ED for complaints of IVC  Currently, the patient is resting calmly.  Physical Exam  BP (!) 146/98 (BP Location: Left Arm)   Pulse 72   Temp 98.4 F (36.9 C) (Oral)   Resp 20   SpO2 98%  Physical Exam General: NAD Cardiac: RRR Lungs: normal effort Psych: not suicidal  ED Course / MDM  EKG:   I have reviewed the labs performed to date as well as medications administered while in observation.  Recent changes in the last 24 hours include olanzapine.  Plan  Current plan is for inpatient psychiatric treatment. T. Pallidum AB still in process. Mark [name] is under involuntary commitment.    Mark Loveless, MD 12/27/21 0930

## 2021-12-27 NOTE — ED Notes (Signed)
EDP in to see, pt reclined, watching TV, calmer

## 2021-12-28 DIAGNOSIS — F200 Paranoid schizophrenia: Principal | ICD-10-CM

## 2021-12-28 MED ORDER — HALOPERIDOL 5 MG PO TABS
5.0000 mg | ORAL_TABLET | Freq: Two times a day (BID) | ORAL | Status: DC
  Administered 2021-12-28 – 2021-12-30 (×5): 5 mg via ORAL
  Filled 2021-12-28 (×12): qty 1

## 2021-12-28 NOTE — Progress Notes (Signed)
   12/28/21 0515  Sleep  Number of Hours 5

## 2021-12-28 NOTE — Progress Notes (Signed)
Adult Psychoeducational Group Note  Date:  12/28/2021 Time:  8:34 PM  Group Topic/Focus:  Wrap-Up Group:   The focus of this group is to help patients review their daily goal of treatment and discuss progress on daily workbooks.  Participation Level:  Active  Participation Quality:  Appropriate  Affect:  Appropriate  Cognitive:  Appropriate  Insight: Appropriate  Engagement in Group:  Improving  Modes of Intervention:  Discussion  Additional Comments:  Pt stated his goal for today was to focus on his treatment plan. Pt stated he accomplished his goal today. Pt stated he talked with his doctor and social worker about his care today. Pt rated his overall day a 9 out of 10. Pt stated he was able to contact his father today which improve his overall day. Pt stated he felt better about himself today. Pt stated he was able to attend all meals. Pt stated he took all medications provided today. Pt stated he attend all groups held today. Pt stated his appetite was pretty good today. Pt rated sleep last night was pretty good. Pt stated the goal tonight was to get some rest. Pt stated he had no physical pain tonight. Pt deny visual hallucinations and auditory issues tonight. Pt denies thoughts of harming himself or others. Pt stated he would alert staff if anything changed  Felipa Furnace 12/28/2021, 8:34 PM

## 2021-12-28 NOTE — Progress Notes (Signed)
   12/28/21 8329  Psych Admission Type (Psych Patients Only)  Admission Status Involuntary  Psychosocial Assessment  Patient Complaints Anxiety  Eye Contact Fair  Facial Expression Anxious  Affect Anxious  Speech Soft;Slow  Interaction Assertive  Motor Activity Fidgety  Appearance/Hygiene In scrubs  Behavior Characteristics Cooperative;Appropriate to situation  Mood Anxious  Thought Process  Coherency WDL  Content Ambivalence  Delusions None reported or observed  Perception Hallucinations  Hallucination None reported or observed  Judgment WDL  Confusion WDL  Danger to Self  Current suicidal ideation? Denies  Danger to Others  Danger to Others None reported or observed

## 2021-12-28 NOTE — H&P (Addendum)
Psychiatric Admission Assessment Adult  Patient Identification: [name] MRN:  [id number] Date of Evaluation:  12/28/2021  Chief Complaint:  Psychosis [facility]) [F29]  History of Present Illness:  Mark [name] is a 61 y.o., male with a past psychiatric history significant for schizophrenia who presents to the [facility] from [facility] emergency room for evaluation and management of increased psychosis and agitation.  According to outside records, the patient presented under IVC taken by family noncompliant with medications at home has diagnoses of schizophrenia, increasingly psychotic and agitated, in emergency room he was noted to be pacing talking and laughing to himself with some mild agitation noted.  Initial assessment on 12/28/2021, patient was evaluated on the inpatient unit, the patient reports he lives in [place] with his father brother and cousin when asking him why he is here he goes on a tangent talking about his cousin telling a lie "he vomits over the commode and makes lies I knocked him on his head he just does not have a brain or think" he admits to previous agitation towards family members last time a month and a half ago, he admits to noncompliance with medication at home but unable to name his medications and tells me he stopped them because "they are not good for me they do not work" he does admit to poor sleep and decreased appetite but denies feeling depressed he denies suicidal or homicidal ideation and denies visual hallucinations but vaguely reports auditory hallucinations admits to talking to himself and occasionally hearing voices but fails to elaborate any details.  He does admit to delusions telling me that occasionally he gets messages from the TV "how my life should be I want to straighten my life" but fails to elaborate any further.  He presents less less with some hand tremors and involuntary upper extremities and lower extremities movement  unsure if it is related to anxiety.  He denies symptoms consistent with PTSD or mania or hypomania. He also admits to lack of compliance with high blood pressure medications at home.  Chart review indicates elevated systolic blood pressure in the emergency room yesterday.  Chart review: Past admission to this facility in February 2023, admission and discharge notes were reviewed, per discharge summary patient was discharged on Depakote 500 mg twice daily, Cogentin 1 mg twice daily, Haldol 5 mg twice daily and Neurontin 400 mg 3 times daily also clozapine was discontinued during that hospitalization, aims was noted to be negative at time of discharge with no sign of EPS.  Psych meds prior to admission: Psychotropic medications included clozapine, Depakote, Haldol, Cogentin, Inderal, trazodone and Vistaril  Sleep Sleep: Patient reports poor sleep at home only 2 to 3 hours a night for the past few weeks  Collateral information: None noted  Past Psychiatric History:  Prior Psychiatric diagnoses: Schizophrenia Past Psychiatric Hospitalizations: At least twice last time February 2023 at this facility  History of self mutilation: Denies Past suicide attempts: Denies Past history of HI, violent or aggressive behavior: History of aggression towards family members as well as history of assault charges 3 weeks ago and patient reports was in prison for 2 years for armed robbery and physical assault in the past  Past Psychiatric medications trials: Depakote, clozapine, Haldol, Cogentin, gabapentin, trazodone, Vistaril with varying efficacy History of ECT/TMS: Denies  Outpatient psychiatric Follow up: Unknown but reported lack of compliance Prior Outpatient Therapy: Unknown    Is the patient at risk to self? Yes.    Has  the patient been a risk to self in the past 6 months? No.  Has the patient been a risk to self within the distant past? No.  Is the patient a risk to others? Yes.    Has the  patient been a risk to others in the past 6 months? No.  Has the patient been a risk to others within the distant past? No.    Substance Use History: Alcohol: Admits to remote alcohol use 20 to 30 years ago Tobacoo: Smokes half pack of cigarette daily Marijuana: Admits to marijuana use 30 to 40 years ago denies any recent use Cocaine: Denies Stimulants: Denies IV drug use: Denies Opiates: Denies Prescribed Meds abuse: Denies H/O withdrawals, blackouts, DTs: Denies History of Detox / Rehab: Denies DUI: Denies  Alcohol Screening: 1. How often do you have a drink containing alcohol?: Never 2. How many drinks containing alcohol do you have on a typical day when you are drinking?: 1 or 2 3. How often do you have six or more drinks on one occasion?: Never AUDIT-C Score: 0 4. How often during the last year have you found that you were not able to stop drinking once you had started?: Never 5. How often during the last year have you failed to do what was normally expected from you because of drinking?: Never 6. How often during the last year have you needed a first drink in the morning to get yourself going after a heavy drinking session?: Never 7. How often during the last year have you had a feeling of guilt of remorse after drinking?: Never 8. How often during the last year have you been unable to remember what happened the night before because you had been drinking?: Never 9. Have you or someone else been injured as a result of your drinking?: No 10. Has a relative or friend or a doctor or another health worker been concerned about your drinking or suggested you cut down?: No Alcohol Use Disorder Identification Test Final Score (AUDIT): 0  Substance Abuse History in the last 12 months:  No.   Tobacco Screening:  Reports smoking half a pack of cigarette daily   Past Medical/Surgical History:  Past Medical History:  Diagnosis Date   COPD (chronic obstructive pulmonary disease) (HCC)     Hypertension    Schizophrenia (HCC)     Past Surgical History:  Procedure Laterality Date   LOWER EXTREMITY ANGIOGRAPHY N/A 09/18/2017   Procedure: LOWER EXTREMITY ANGIOGRAPHY- Recheck lysis;  Surgeon: Maeola Harman, MD;  Location: [facility] INVASIVE CV LAB;  Service: Cardiovascular;  Laterality: N/A;   PERIPHERAL VASCULAR INTERVENTION Left 09/18/2017   Procedure: PERIPHERAL VASCULAR INTERVENTION;  Surgeon: Maeola Harman, MD;  Location: [facility] INVASIVE CV LAB;  Service: Cardiovascular;  Laterality: Left;   THROMBECTOMY FEMORAL ARTERY Left 09/17/2017   Procedure: POSSIBLE THROMBECTOMY;  Surgeon: Maeola Harman, MD;  Location: [facility] OR;  Service: Vascular;  Laterality: Left;   VENOGRAM Left 09/17/2017   Procedure: ULTRASOUND POPLITEAL ACCESS; CENTRAL VENOGRAM, IVVS, LYSIS CATHETER PLACEMENT;  Surgeon: Maeola Harman, MD;  Location: [facility] OR;  Service: Vascular;  Laterality: Left;    Family History: History reviewed. No pertinent family history.  Family Psychiatric History:  Psychiatric illness: Denies Suicide: Denies Substance Abuse: Denied  Social History:  Social History   Substance and Sexual Activity  Alcohol Use No     Social History   Substance and Sexual Activity  Drug Use Never    Living situation: Lives with his  father, brother and cousin Social support: Limited Marital Status: Never married Children: Patient reports he has 7 children 4 boys and 3 girls live with her mother in [place], all from same mother per patient's report Education: 9 grade Employment: Unemployed, reports on disability but unable to specify since when [employer] service: Denies Legal history: Denies pending charges or court dates but reports 3 weeks ago was in jail for physical assault but no pending court dates, reports was in prison in the past for 2 years for armed robbery and assault on Emergency planning/management officer. Trauma: Denies Access to guns: Denies   Allergies:  No  Known Allergies  Lab Results: No results found for this or any previous visit (from the past 48 hour(s)).  Blood Alcohol level:  Lab Results  Component Value Date   ETH <10 12/03/2021   ETH <10 11/24/2021    Metabolic Disorder Labs:  Lab Results  Component Value Date   HGBA1C 6.1 (H) 06/28/2021   MPG 128 06/28/2021   No results found for: "PROLACTIN" Lab Results  Component Value Date   CHOL 164 07/04/2021   TRIG 139 07/04/2021   HDL 44 07/04/2021   CHOLHDL 3.7 07/04/2021   VLDL 28 07/04/2021   LDLCALC 92 07/04/2021   LDLCALC 102 (H) 02/11/2021    Current Medications: Current Facility-Administered Medications  Medication Dose Route Frequency Provider Last Rate Last Admin   acetaminophen (TYLENOL) tablet 650 mg  650 mg Oral Q4H PRN Eligha Bridegroom, NP   650 mg at 12/28/21 0830   alum & mag hydroxide-simeth (MAALOX/MYLANTA) 200-200-20 MG/5ML suspension 30 mL  30 mL Oral Q6H PRN Eligha Bridegroom, NP       amLODipine (NORVASC) tablet 5 mg  5 mg Oral Daily Eligha Bridegroom, NP   5 mg at 12/28/21 3295   benztropine (COGENTIN) tablet 1 mg  1 mg Oral BID Eligha Bridegroom, NP   1 mg at 12/28/21 1884   divalproex (DEPAKOTE ER) 24 hr tablet 500 mg  500 mg Oral BID Eligha Bridegroom, NP   500 mg at 12/28/21 0831   gabapentin (NEURONTIN) capsule 400 mg  400 mg Oral TID Eligha Bridegroom, NP   400 mg at 12/28/21 1203   haloperidol (HALDOL) tablet 5 mg  5 mg Oral BID Abbott Pao, Rohen Kimes, MD       risperiDONE (RISPERDAL M-TABS) disintegrating tablet 2 mg  2 mg Oral Q8H PRN Eligha Bridegroom, NP       And   LORazepam (ATIVAN) tablet 1 mg  1 mg Oral PRN Eligha Bridegroom, NP       And   ziprasidone (GEODON) injection 20 mg  20 mg Intramuscular PRN Eligha Bridegroom, NP       magnesium hydroxide (MILK OF MAGNESIA) suspension 30 mL  30 mL Oral Daily PRN Eligha Bridegroom, NP       nicotine (NICODERM CQ - dosed in mg/24 hours) patch 21 mg  21 mg Transdermal Daily Eligha Bridegroom, NP       ondansetron  (ZOFRAN) tablet 4 mg  4 mg Oral Q8H PRN Eligha Bridegroom, NP       propranolol (INDERAL) tablet 10 mg  10 mg Oral Daily Eligha Bridegroom, NP   10 mg at 12/28/21 0831   traZODone (DESYREL) tablet 50 mg  50 mg Oral QHS Eligha Bridegroom, NP   50 mg at 12/27/21 2315   [START ON 01/02/2022] Vitamin D (Ergocalciferol) (DRISDOL) 1.25 MG (50000 UNIT) capsule 50,000 Units  50,000 Units Oral Weekly Eligha Bridegroom, NP  PTA Medications: Medications Prior to Admission  Medication Sig Dispense Refill Last Dose   amLODipine (NORVASC) 5 MG tablet Take 1 tablet (5 mg total) by mouth daily. 30 tablet 0    aspirin EC 81 MG tablet Take 81 mg by mouth daily. Swallow whole.      benztropine (COGENTIN) 1 MG tablet Take 1 tablet (1 mg total) by mouth 2 (two) times daily. 60 tablet 0    cloZAPine (CLOZARIL) 100 MG tablet Take 300 mg by mouth at bedtime.      cyclobenzaprine (FLEXERIL) 10 MG tablet Take 10 mg by mouth 3 (three) times daily as needed for muscle spasms.      divalproex (DEPAKOTE ER) 500 MG 24 hr tablet Take 1 tablet (500 mg total) by mouth 2 (two) times daily. 60 tablet 0    ergocalciferol (VITAMIN D2) 1.25 MG (50000 UT) capsule Take 50,000 Units by mouth once a week.      gabapentin (NEURONTIN) 100 MG capsule Take 100 mg by mouth daily.      gabapentin (NEURONTIN) 400 MG capsule Take 400 mg by mouth 3 (three) times daily.      gabapentin (NEURONTIN) 400 MG capsule Take 400 mg by mouth 3 (three) times daily.      haloperidol (HALDOL) 5 MG tablet Take 1 tablet (5 mg total) by mouth 2 (two) times daily. 60 tablet 0    hydrOXYzine (VISTARIL) 25 MG capsule Take 2 capsules by mouth 2 (two) times daily.      metoprolol tartrate (LOPRESSOR) 50 MG tablet Take 1 tablet (50 mg total) by mouth 2 (two) times daily. 60 tablet 0    omeprazole (PRILOSEC) 20 MG capsule Take 20 mg by mouth daily.      pantoprazole (PROTONIX) 20 MG tablet Take 1 tablet (20 mg total) by mouth daily. 30 tablet 0    propranolol  (INDERAL) 10 MG tablet Take 1 tablet (10 mg total) by mouth daily. 30 tablet 0    traZODone (DESYREL) 50 MG tablet Take 50 mg by mouth at bedtime.       Musculoskeletal: Strength & Muscle Tone: within normal limits Gait & Station: normal Patient leans: N/A   Physical Findings: AIMS: Facial and Oral Movements Muscles of Facial Expression: None, normal Lips and Perioral Area: None, normal Jaw: None, normal Tongue: None, normal,Extremity Movements Upper (arms, wrists, hands, fingers): Mild Lower (legs, knees, ankles, toes): Mild, Trunk Movements Neck, shoulders, hips: Mild, Overall Severity Severity of abnormal movements (highest score from questions above): Minimal Incapacitation due to abnormal movements: Minimal Patient's awareness of abnormal movements (rate only patient's report): Aware, no distress, Dental Status Current problems with teeth and/or dentures?: No Does patient usually wear dentures?: No  CIWA:    COWS:     Psychiatric Specialty Exam:  General Appearance: Disheveled, below average hygiene, appears older than stated age, dressed in hospital scrubs  Behavior: Mildly anxious and irritable but cooperative in general  Psychomotor Activity: No psychomotor retardation, mild agitation and irritability noted  Eye Contact: Limited Speech: Loud, increased tone and volume but normal amount, rambling at times   Mood: Irritable, anxious Affect: Congruent  Thought Process: Concrete, occasionally circumstantial and disorganized Descriptions of Associations: None linear at times disorganized Thought Content: Hallucinations: Vaguely admits to auditory hallucinations talking to himself at times, denies VH Delusions: Delusional he receives messages from TV at times, some guardedness and paranoia noted Suicidal Thoughts: Denies SI, intention, plan  Homicidal Thoughts: Denies HI, intention, plan   Alertness/Orientation: Alert,  partially oriented to person place time and  situation  Insight: poor Judgment: poor  Memory: Proofreader  Concentration: Poor, easily distracted Attention Span: Poor Recall: Limited Fund of Knowledge: Limited   Physical Exam:  Physical Exam Constitutional:      Appearance: Normal appearance.  HENT:     Head: Normocephalic and atraumatic.     Nose: Nose normal.  Pulmonary:     Effort: Pulmonary effort is normal.  Skin:    General: Skin is warm and dry.  Neurological:     General: No focal deficit present.     Mental Status: He is alert.    Review of Systems  All other systems reviewed and are negative.  Blood pressure 116/75, pulse (!) 104, temperature 97.9 F (36.6 C), temperature source Oral, resp. rate 16, height 6' (1.829 m), weight 82.1 kg, SpO2 100 %. Body mass index is 24.55 kg/m.   Assets  Assets:Communication Skills; Desire for Improvement; Physical Health    Treatment Plan Summary:   ASSESSMENT:  Principal Diagnosis: Schizophrenia ([place]) Diagnosis:  Principal Problem:   Schizophrenia ([place])   PLAN: Safety and Monitoring:  -- Involuntary admission to inpatient psychiatric unit for safety, stabilization and treatment  -- Daily contact with patient to assess and evaluate symptoms and progress in treatment  -- Patient's case to be discussed in multi-disciplinary team meeting  -- Observation Level : q15 minute checks  -- Vital signs:  q12 hours  -- Precautions: suicide, elopement, and assault  2. Medications:   Restart Depakote 500 mg twice daily for mood stabilization and agitation  Discontinue Zyprexa for history of lack of efficacy  Restart Haldol 5 mg twice daily for psychosis, monitor efficacy and safety and titrate accordingly, consider Haldol decanoate injection to help with long-term compliance if effective and well-tolerated  Restart Cogentin 1 mg twice daily for EPS  Restart Neurontin 400 mg 3 times daily for mood and anxiety  Restart Inderal 10 mg daily for  anxiety and tachycardia, also will help with hypertension  Restarted trazodone 50 mg at bedtime for sleep  Restart Norvasc 5 mg daily for hypertension, consider titrating up to 10 mg if needed  Restart vitamin D weekly for vitamin D deficiency  Risperidone, Ativan and Geodon as needed for episodic psychotic agitation   RPR reactive, titer 1:2, Patient denies h/o treatment for syphilis, patient is poor historian, consulted with ID over the phone, chart and case were reviewed, T pallidum Abs negative, no need for treatment at this time, results consistent with false positive result   The risks/benefits/side-effects/alternatives to this medication were discussed in detail with the patient and time was given for questions. The patient consents to medication trial.    -- Metabolic profile and EKG monitoring obtained while on an atypical antipsychotic (BMI: Lipid Panel: HbgA1c: QTc:)      3. Labs Reviewed: Comprehensive metabolic panel no significant abnormalities noted, CBC no significant abnormalities noted, Depakote level 18 below therapeutic on 8/18 and 59 on 7/30, RPR reactive, T Pallidum Abs negative, titer 1:2 8/15, HIV negative 8/18, UA no significant abnormalities noted, urine drug screen -8/18, EKG 7/31 QTc 423 MRI brain 7/20 no abnormalities noted     Lab ordered: Hemoglobin A1c, fasting lipid panel Depakote level, LFT and ammonia level on 8/26 to ensure therapeutic level and no toxicity   4. Tobacco Use Disorder  -- Nicotine patch 21mg /24 hours ordered  -- Smoking cessation encouraged  5. Group and Therapy: -- Encouraged patient to participate in  unit milieu and in scheduled group therapies     6. Discharge Planning:   -- Social work and case management to assist with discharge planning and identification of hospital follow-up needs prior to discharge  -- Estimated LOS: 5-7 days  -- Discharge Concerns: Need to establish a safety plan; Medication compliance and effectiveness  --  Discharge Goals: Return home with outpatient referrals for mental health follow-up including medication management/psychotherapy   The patient is agreeable with the medication plan, as above. We will monitor the patient's response to pharmacologic treatment, and adjust medications as necessary. Patient is encouraged to participate in group therapy while admitted to the psychiatric unit. We will address other chronic and acute stressors, which contributed to the patient's worsening psychosis and physical aggression, in order to reduce the risk of self-harm at discharge.   Physician Treatment Plan for Primary Diagnosis: Schizophrenia ([place]) Long Term Goal(s): Improvement in symptoms so as ready for discharge  Short Term Goals: Ability to identify changes in lifestyle to reduce recurrence of condition will improve, Ability to verbalize feelings will improve, Ability to disclose and discuss suicidal ideas, Ability to demonstrate self-control will improve, Ability to identify and develop effective coping behaviors will improve, Ability to maintain clinical measurements within normal limits will improve, and Compliance with prescribed medications will improve   I certify that inpatient services furnished can reasonably be expected to improve the patient's condition.    Total Time Spent in Direct Patient Care:  I personally spent 55 minutes on the unit in direct patient care. The direct patient care time included face-to-face time with the patient, reviewing the patient's chart, communicating with other professionals, and coordinating care. Greater than 50% of this time was spent in counseling or coordinating care with the patient regarding goals of hospitalization, psycho-education, and discharge planning needs.    Mckale Haffey Winfred Leeds, MD 8/22/20232:18 PM

## 2021-12-28 NOTE — BHH Suicide Risk Assessment (Addendum)
[facility] Admission Suicide Risk Assessment   Nursing information obtained from:  Patient Demographic factors:  Male, Unemployed Current Mental Status:  NA Loss Factors:  NA Historical Factors:  NA Risk Reduction Factors:  Positive social support, Living with another person, especially a relative  Total Time spent with patient: 1 hour Principal Problem: Schizophrenia (HCC) Diagnosis:  Principal Problem:   Schizophrenia (HCC)  Subjective Data: Mark [name] is a 61 y.o., male with a past psychiatric history significant for schizophrenia who presents to the [facility] from [facility] emergency room for evaluation and management of increased psychosis and agitation.  According to outside records, the patient presented under IVC taken by family noncompliant with medications at home has diagnoses of schizophrenia, increasingly psychotic and agitated, in emergency room he was noted to be pacing talking and laughing to himself with some mild agitation noted.   Initial assessment on 12/28/2021, patient was evaluated on the inpatient unit, the patient reports he lives in [place] with his father brother and cousin when asking him why he is here he goes on a tangent talking about his cousin telling a lie "he vomits over the commode and makes lies I knocked him on his head he just does not have a brain or think" he admits to previous agitation towards family members last time a month and a half ago, he admits to noncompliance with medication at home but unable to name his medications and tells me he stopped them because "they are not good for me they do not work" he does admit to poor sleep and decreased appetite but denies feeling depressed he denies suicidal or homicidal ideation and denies visual hallucinations but vaguely reports auditory hallucinations admits to talking to himself and occasionally hearing voices but fails to elaborate any details.  He does admit to delusions telling me  that occasionally he gets messages from the TV "how my life should be I want to straighten my life" but fails to elaborate any further.  He presents less less with some hand tremors and involuntary upper extremities and lower extremities movement unsure if it is related to anxiety.  He denies symptoms consistent with PTSD or mania or hypomania. He also admits to lack of compliance with high blood pressure medications at home.  Chart review indicates elevated systolic blood pressure in the emergency room yesterday.  Continued Clinical Symptoms:  Alcohol Use Disorder Identification Test Final Score (AUDIT): 0 The "Alcohol Use Disorders Identification Test", Guidelines for Use in Primary Care, Second Edition.  [employer] writer [facility] [facility]). Score between 0-7:  no or low risk or alcohol related problems. Score between 8-15:  moderate risk of alcohol related problems. Score between 16-19:  high risk of alcohol related problems. Score 20 or above:  warrants further diagnostic evaluation for alcohol dependence and treatment.   CLINICAL FACTORS:   Schizophrenia:   Paranoid or undifferentiated type   Musculoskeletal: Strength & Muscle Tone: within normal limits Gait & Station: normal Patient leans: N/A  Psychiatric Specialty Exam:  Presentation  General Appearance: Fairly Groomed  Eye Contact:Good  Speech:Garbled (improved)  Speech Volume:Normal  Handedness:Right   Mood and Affect  Mood:Euthymic  Affect:Congruent   Thought Process  Thought Processes:Coherent  Descriptions of Associations:Intact  Orientation:Full (Time, Place and Person)  Thought Content:Logical  History of Schizophrenia/Schizoaffective disorder:No  Duration of Psychotic Symptoms:Greater than six months  Hallucinations:No data recorded Ideas of Reference:None  Suicidal Thoughts:No data recorded Homicidal Thoughts:No data recorded  Sensorium  Memory:Immediate  Fair  Judgment:Fair  Insight:Fair   Executive Functions  Concentration:Fair  Attention Span:Fair  Recall:Good  Fund of Knowledge:Good  Language:Good   Psychomotor Activity  Psychomotor Activity:No data recorded  Assets  Assets:Communication Skills; Desire for Improvement; Physical Health   Sleep  Sleep:No data recorded   Physical Exam: Physical Exam ROS Blood pressure 116/75, pulse (!) 104, temperature 97.9 F (36.6 C), temperature source Oral, resp. rate 16, height 6' (1.829 m), weight 82.1 kg, SpO2 100 %. Body mass index is 24.55 kg/m.   COGNITIVE FEATURES THAT CONTRIBUTE TO RISK:  Closed-mindedness and Thought constriction (tunnel vision)    SUICIDE RISK:   Severe:  Frequent, intense, and enduring suicidal ideation, specific plan, no subjective intent, but some objective markers of intent (i.e., choice of lethal method), the method is accessible, some limited preparatory behavior, evidence of impaired self-control, severe dysphoria/symptomatology, multiple risk factors present, and few if any protective factors, particularly a lack of social support.  PLAN OF CARE:   Safety and Monitoring:             -- Involuntary admission to inpatient psychiatric unit for safety, stabilization and treatment             -- Daily contact with patient to assess and evaluate symptoms and progress in treatment             -- Patient's case to be discussed in multi-disciplinary team meeting             -- Observation Level : q15 minute checks             -- Vital signs:  q12 hours             -- Precautions: suicide, elopement, and assault   2. Medications:              Restart Depakote 500 mg twice daily for mood stabilization and agitation             Discontinue Zyprexa for history of lack of efficacy             Restart Haldol 5 mg twice daily for psychosis, monitor efficacy and safety and titrate accordingly, consider Haldol decanoate injection to help with long-term  compliance if effective and well-tolerated             Restart Cogentin 1 mg twice daily for EPS             Restart Neurontin 400 mg 3 times daily for mood and anxiety             Restart Inderal 10 mg daily for anxiety and tachycardia, also will help with hypertension             Restarted trazodone 50 mg at bedtime for sleep             Restart Norvasc 5 mg daily for hypertension, consider titrating up to 10 mg if needed             Restart vitamin D weekly for vitamin D deficiency             Risperidone, Ativan and Geodon as needed for episodic psychotic agitation               RPR reactive, titer 1:2, Patient denies h/o treatment for syphilis, patient is poor historian, will contact on call hospitalist and discuss patients care for further management needed.  The risks/benefits/side-effects/alternatives to this medication were discussed in detail with the patient and  time was given for questions. The patient consents to medication trial.                -- Metabolic profile and EKG monitoring obtained while on an atypical antipsychotic (BMI: Lipid Panel: HbgA1c: QTc:)               3. Labs Reviewed: Comprehensive metabolic panel no significant abnormalities noted, CBC no significant abnormalities noted, Depakote level 18 below therapeutic on 8/18 and 59 on 7/30, RPR reactive, T Pallidum Abs negative, titer 1:2 8/15, HIV negative 8/18, UA no significant abnormalities noted, urine drug screen -8/18, EKG 7/31 QTc 423 MRI brain 7/20 no abnormalities noted      Lab ordered: Hemoglobin A1c, fasting lipid panel Depakote level, LFT and ammonia level on 8/26 to ensure therapeutic level and no toxicity   4. Tobacco Use Disorder             -- Nicotine patch 21mg /24 hours ordered             -- Smoking cessation encouraged   5. Group and Therapy: -- Encouraged patient to participate in unit milieu and in scheduled group therapies                 6. Discharge Planning:              -- Social  work and case management to assist with discharge planning and identification of hospital follow-up needs prior to discharge             -- Estimated LOS: 5-7 days             -- Discharge Concerns: Need to establish a safety plan; Medication compliance and effectiveness             -- Discharge Goals: Return home with outpatient referrals for mental health follow-up including medication management/psychotherapy  I certify that inpatient services furnished can reasonably be expected to improve the patient's condition.   Sabree Nuon [id number], MD 12/28/2021, 2:44 PM

## 2021-12-28 NOTE — Progress Notes (Signed)
Recreation Therapy Notes  Patient admitted to unit 8.21.23. Due to admission within last year, no new recreation therapy assessment conducted at this time. Last assessment conducted on 2.23.23.    Reason for current admission per patient, "me and my father got problems".  Patient reports stressor as "talking to him about what he about".  Patient reports goal of "making it on the streets and uses a program called [employer]".  Patient leisure interests are basketball, baseball, football and some soccer.  Patient strengths are "get along and conversation".  Patient areas of improvement "learn how to talk to my father".  Patient denies SI, HI, AVH at this time.    Information found below from assessment conducted 2.23.23.  Coping Skills:  TV, Sports, Deep Breathing, Talk, Avoidance, Hot Bath/Shower  Patient Strengths:  "trying to get back home"  Areas of Improvement:  "people handcuff me upfront"       Kathryn Linarez Darryl Lent A 12/28/2021 12:52 PM

## 2021-12-28 NOTE — Progress Notes (Signed)
  Initial Admission Note:  Mark [name] is a 61 y.o. male, being admitted involuntarily to [facility] with a diagnosis of Schizophrenia.  Patient presents at [facility] alert and oriented.  Patient appears moderately anxious, but denies Anxiety and Depression.  Pt reports no AVH.  Patient further reports no SI/HI thoughts, and verbally contracts to see a staff member before acting on any thoughts of this kind.  Patient is negative for COVID, BAL, and UDS.    Patient is transferring from [facility].  Patient's past medical history includes of COPD, HTN, and Schizophrenia. Patient reports smoking  pack of cigarettes daily, but refuses Nicotine patch or gum.  Patient denies any physical, verbal, or sexual abuse.  Patient's present stressor is his living condition. Patient reports that he currently lives in a home with his father, brother, and cousin.  Per patient, his cousin "vomits all over the toilet and tells lies."  Patient states he only shows aggression when he has to "knock'em on the head because they act like they ain't got no brain in the head." Patient reports he is triggered when "people are not doing what they supposed to do, and when I don't have the right medication."  Patient states, "it stresses me the way I am being treated by my family."  A skin search was conducted.  No contraband was found.  Unit rules and expectations were communicated and all admission paperwork signed.  Patient medications were administered.  Patient was provided food and drink.  Patient is currently safe on the unit with Q 15-minute safety checks.

## 2021-12-28 NOTE — Plan of Care (Signed)

## 2021-12-28 NOTE — BHH Counselor (Signed)
Adult Comprehensive Assessment  Patient ID: Mark [name], male   DOB: 1960/08/23, 61 y.o.   MRN: [id number]  Information Source: Information source: Patient  Current Stressors:  Patient states their primary concerns and needs for treatment are:: Patient states he is not taking the right medication and he has not been sleeping. Patient states their goals for this hospitilization and ongoing recovery are:: Patient would like to get more sleep and work on being on the right medication. Patient discussed being on the shot so he doesn't have to take pills everyday Educational / Learning stressors: denies stressor Employment / Job issues: denies stressor Family Relationships: patient lives with his father, little cousin and little brother.  Patient states that he has some conflict with family members about his medications, food and other daily living things. Financial / Lack of resources (include bankruptcy): Patient states he gets social security income but that his dad helps manage his money Housing / Lack of housing: Currently in stable housing with father, little cousin and brother Physical health (include injuries & life threatening diseases): denies current stressors Social relationships: Patient has a good community at [employer] and other peer supports that he shares help him and support him Substance abuse: denies substances Bereavement / Loss: denies stressor  Living/Environment/Situation:  Living Arrangements: Parent, Other relatives Living conditions (as described by patient or guardian): Patient reports that he is comfortable but has some conflict with family Who else lives in the home?: Father, little cousin and brother How long has patient lived in current situation?: 5-6 years What is atmosphere in current home: Comfortable, Supportive  Family History:  Marital status: Single Are you sexually active?: No What is your sexual orientation?: Heterosexual Has your sexual  activity been affected by drugs, alcohol, medication, or emotional stress?: Denies Does patient have children?: No  Childhood History:  By whom was/is the patient raised?: Both parents Additional childhood history information: He reports a good childhood including being involved in sports and enjoying hanging out with other kids in the neighborhood Description of patient's relationship with caregiver when they were a child: patient states he has a good relationship with his parents as a child Patient's description of current relationship with people who raised him/her: mother passed away 5 years ago, some conflict with father due to differences of opinions How were you disciplined when you got in trouble as a child/adolescent?: no abuse notated Does patient have siblings?: Yes Number of Siblings: 25 Description of patient's current relationship with siblings: patient states that some siblings have passed away but that he generally has a good relationship with siblings Did patient suffer any verbal/emotional/physical/sexual abuse as a child?: No Did patient suffer from severe childhood neglect?: No Has patient ever been sexually abused/assaulted/raped as an adolescent or adult?: No Was the patient ever a victim of a crime or a disaster?: No Witnessed domestic violence?: No Has patient been affected by domestic violence as an adult?: No  Education:  Highest grade of school patient has completed: 9th grade Currently a student?: No Learning disability?: No  Employment/Work Situation:   Employment Situation: On disability Why is Patient on Disability: Schizophrenia 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?: No  Financial Resources:   Surveyor, quantity resources: [employer], [place] Does patient  have a Technical brewer  payee or guardian?: Yes Name of representative payee or guardian: Cristal Deer [phone]  Alcohol/Substance Abuse:   What has been your use of drugs/alcohol within the last 12 months?: denies substances If attempted suicide, did drugs/alcohol play a role in this?: No Alcohol/Substance Abuse Treatment Hx: Denies past history If yes, describe treatment: none Has alcohol/substance abuse ever caused legal problems?: No  Social Support System:   Patient's Community Support System: Good Describe Community Support System: Transport planner, [employer], family  Leisure/Recreation:   Do You Have Hobbies?: Yes Leisure and Hobbies: sports  Strengths/Needs:   What is the patient's perception of their strengths?: patient states that he is good at communicating  Discharge Plan:   Currently receiving community mental health services: Yes (From Whom) ([facility], [employer]) Patient states concerns and preferences for aftercare planning are: none Patient states they will know when they are safe and ready for discharge when: Patient states that he feels like he doesn't need to be here but is agreeable to working on his sleep and taking an LAI Does patient have access to transportation?: Yes (family can provide transport) Does patient have financial barriers related to discharge medications?: No Will patient be returning to same living situation after discharge?: Yes  Summary/Recommendations:   Summary and Recommendations (to be completed by the evaluator): [name] is a 61 year old male who was admitted to [facility] for reported med incompliance and aggression toward family.  Patient has a legal guardian, Kaion Tisdale, father.  Patient reports some conflict with family as well as miscommunication.  Patient states that he lives with his father, brother and little cousin and feels comfortable where he lives. [facility] and [facility] to  [facility] for mental health. While here, [place] can benefit from crisis stabilization, medication management, therapeutic milieu, and referrals for services.   Peyten Weare E Jsaon Yoo. 12/28/2021

## 2021-12-28 NOTE — Progress Notes (Signed)
     12/27/21 2220  Psych Admission Type (Psych Patients Only)  Admission Status Involuntary  Psychosocial Assessment  Patient Complaints Agitation;Anger;Anxiety;Insomnia  Eye Contact Fair  Facial Expression Wide-eyed  Affect Anxious  Speech Loud;Pressured  Interaction Assertive  Motor Activity Fidgety  Appearance/Hygiene In scrubs  Behavior Characteristics Cooperative;Appropriate to situation  Mood Anxious  Thought Process  Coherency WDL  Content Blaming others  Delusions None reported or observed  Perception Hallucinations  Hallucination None reported or observed  Judgment WDL  Confusion WDL  Danger to Self  Current suicidal ideation? Denies  Danger to Others  Danger to Others None reported or observed

## 2021-12-29 ENCOUNTER — Encounter (HOSPITAL_COMMUNITY): Payer: Self-pay

## 2021-12-29 LAB — HEMOGLOBIN A1C
Hgb A1c MFr Bld: 6.3 % — ABNORMAL HIGH (ref 4.8–5.6)
Mean Plasma Glucose: 134.11 mg/dL

## 2021-12-29 LAB — LIPID PANEL
Cholesterol: 140 mg/dL (ref 0–200)
HDL: 36 mg/dL — ABNORMAL LOW (ref >40)
LDL Cholesterol: 77 mg/dL (ref 0–99)
Total CHOL/HDL Ratio: 3.9 RATIO
Triglycerides: 134 mg/dL (ref <150)
VLDL: 27 mg/dL (ref 0–40)

## 2021-12-29 MED ORDER — TRAZODONE HCL 150 MG PO TABS
150.0000 mg | ORAL_TABLET | Freq: Every day | ORAL | Status: DC
Start: 2021-12-29 — End: 2022-01-05
  Administered 2021-12-29 – 2022-01-04 (×7): 150 mg via ORAL
  Filled 2021-12-29 (×9): qty 1

## 2021-12-29 NOTE — Progress Notes (Signed)
   12/29/21 2000  Psych Admission Type (Psych Patients Only)  Admission Status Involuntary  Psychosocial Assessment  Patient Complaints Anxiety  Eye Contact Fair  Facial Expression Anxious  Affect Anxious  Speech Soft  Interaction Assertive  Motor Activity Slow  Appearance/Hygiene In scrubs  Behavior Characteristics Cooperative  Mood Anxious;Preoccupied  Aggressive Behavior  Effect No apparent injury  Thought Process  Coherency WDL  Content WDL  Delusions None reported or observed  Perception Hallucinations  Hallucination Auditory  Judgment WDL  Confusion WDL  Danger to Self  Current suicidal ideation? Denies

## 2021-12-29 NOTE — BHH Counselor (Signed)
CSW called [facility] and they confirmed that patient regularly attends their day program.  They are unable to provide any medication management or reminding patient to take medications.  Staff member shared concerns that patient would not benefit if we were to eliminate services.  CSW will look into peer support services to assist patient so that he can continue ongoing attendance to [facility].     Mark Rickles, LCSW, LCAS Clincal Social Worker  [facility]

## 2021-12-29 NOTE — Progress Notes (Signed)
   12/29/21 0515  Sleep  Number of Hours 7.25

## 2021-12-29 NOTE — Group Note (Signed)
Recreation Therapy Group Note   Group Topic:Team Building  Group Date: 12/29/2021 Start Time: 1000 End Time: 1045 Facilitators: Caroll Rancher, LRT,CTRS Location: [address] Dayroom   Goal Area(s) Addresses:  Patient will effectively work with peer towards shared goal.  Patient will identify skills used to make activity successful.  Patient will identify how skills used during activity can be applied to reach post d/c goals.   Group Description: [employer]. In teams of 5-6, patients were given 12 craft pipe cleaners. Using the materials provided, patients were instructed to compete again the opposing team(s) to build the tallest free-standing structure from floor level. The activity was timed; difficulty increased by Clinical research associate as Production designer, theatre/television/film continued.  Systematically resources were removed with additional directions for example, placing one arm behind their back, working in silence, and shape stipulations. LRT facilitated post-activity discussion reviewing team processes and necessary communication skills involved in completion. Patients were encouraged to reflect how the skills utilized, or not utilized, in this activity can be incorporated to positively impact support systems post discharge.   Affect/Mood: Appropriate   Participation Level: Engaged   Participation Quality: Independent   Behavior: Appropriate   Speech/Thought Process: Focused   Insight: Good   Judgement: Good   Modes of Intervention: Team-building   Patient Response to Interventions:  Engaged   Education Outcome:  Acknowledges education and In group clarification offered    Clinical Observations/Individualized Feedback: Pt was bright and engaged throughout group session.  Pt defined teamwork as "working together to make sure things are right".  Pt expressed he didn't know how to do the activity but didn't really give it shot to see what he could do.  Pt expressed ways to deal with the  frustration of the activity was medication.  Pt used a basketball analogy to explain how someone to "call the shots" but at the same time each person has to has to know their position.    Plan: Continue to engage patient in RT group sessions 2-3x/week.   Caroll Rancher, Antonietta Jewel 12/29/2021 12:28 PM

## 2021-12-29 NOTE — Group Note (Signed)
LCSW Group Therapy Note  Group Date: 12/29/2021 Start Time: 1300 End Time: 1400   Type of Therapy and Topic:  Group Therapy: Anger Cues and Responses  Participation Level:  Did Not Attend   Description of Group:   In this group, patients learned how to recognize the physical, cognitive, emotional, and behavioral responses they have to anger-provoking situations.  They identified a recent time they became angry and how they reacted.  They analyzed how their reaction was possibly beneficial and how it was possibly unhelpful.  The group discussed a variety of healthier coping skills that could help with such a situation in the future.  Focus was placed on how helpful it is to recognize the underlying emotions to our anger, because working on those can lead to a more permanent solution as well as our ability to focus on the important rather than the urgent.  Therapeutic Goals: Patients will remember their last incident of anger and how they felt emotionally and physically, what their thoughts were at the time, and how they behaved. Patients will identify how their behavior at that time worked for them, as well as how it worked against them. Patients will explore possible new behaviors to use in future anger situations. Patients will learn that anger itself is normal and cannot be eliminated, and that healthier reactions can assist with resolving conflict rather than worsening situations.  Summary of Patient Progress:  Did not attend   Therapeutic Modalities:   Cognitive Behavioral Therapy    Beatris Si, LCSW 12/29/2021  2:01 PM

## 2021-12-29 NOTE — BHH Group Notes (Signed)
Pt didn't attend group. 

## 2021-12-29 NOTE — Progress Notes (Signed)
[facility] MD Progress Note  12/29/2021 11:32 AM Mark [name]  MRN:  [id number]   Reason for Admission:  Mark [name] is a 61 y.o., male with a past psychiatric history significant for schizophrenia who presents to the [facility] from [facility] emergency room for evaluation and management of increased psychosis and agitation. The patient is currently on Hospital Day 2.   Chart Review from last 24 hours:  The patient's chart was reviewed and nursing notes were reviewed. The patient's case was discussed in multidisciplinary team meeting. Per [facility] patient is compliant with his scheduled medications in the hospital including Cogentin and Depakote Neurontin Haldol trazodone Inderal and Norvasc.  He did not need and was not given any as needed for agitation or aggression and did not require any as needed Ativan for anxiety or agitation.  Staff report patient to continue to be responding to stimuli talking to himself in the room but attending better to his personal hygiene.  Information Obtained Today During Patient Interview: The patient was seen and evaluated on the unit. On assessment today the patient reports took a shower earlier today, prior to being coming in the room he was talking to himself he does admit to auditory hallucinations vaguely but unable to describe them or tell me what the voices tell him but denies commanding hallucinations to harm self or others.  He denies VH he denies SI or HI.  He reports poor sleep last night with multiple interruption.  He reports he feels medications to be helpful for him helping him think clearly and feels calmer and denies side effects to them.  He continues to present with occasional restless and involuntary movement to be noted.  No stiffness or EPS symptoms or signs noted or reported. Patient reports to me his father takes him to his doctor's appointment, he reports his father is his guardian and payee.  [facility] and he likes services provided there, discussed with patient that he would not qualify for ACT team services after discharge but will refer him for peers support to help him with transportation, compliance with follow-up appointments and getting his medications.  Sleep  Sleep: Interrupted per patient's report, staff reported 7.25 hours  Principal Problem: Schizophrenia ([place]) Diagnosis: Principal Problem:   Schizophrenia ([place])    Past Psychiatric History: Prior Psychiatric diagnoses: Schizophrenia Past Psychiatric Hospitalizations: At least twice last time February 2023 at this facility   History of self mutilation: Denies Past suicide attempts: Denies Past history of HI, violent or aggressive behavior: History of aggression towards family members as well as history of assault charges 3 weeks ago and patient reports was in prison for 2 years for armed robbery and physical assault in the past   Past Psychiatric medications trials: Depakote, clozapine, Haldol, Cogentin, gabapentin, trazodone, Vistaril with varying efficacy History of ECT/TMS: Denies   Outpatient psychiatric Follow up: Unknown but reported lack of compliance Prior Outpatient Therapy: Unknown  Past Medical History:  Past Medical History:  Diagnosis Date   COPD (chronic obstructive pulmonary disease) ([place])    Hypertension    Schizophrenia ([place])     Past Surgical History:  Procedure Laterality Date   LOWER EXTREMITY ANGIOGRAPHY N/A 09/18/2017   Procedure: LOWER EXTREMITY ANGIOGRAPHY- Recheck lysis;  Surgeon: Waynetta Sandy, MD;  Location: [place] [facility];  Service: Cardiovascular;  Laterality: N/A;   PERIPHERAL VASCULAR INTERVENTION Left 09/18/2017   Procedure: PERIPHERAL VASCULAR  INTERVENTION;  Surgeon: Waynetta Sandy, MD;  Location: [place] [facility];  Service: Cardiovascular;  Laterality: Left;   THROMBECTOMY FEMORAL ARTERY Left 09/17/2017    Procedure: POSSIBLE THROMBECTOMY;  Surgeon: Waynetta Sandy, MD;  Location: [place];  Service: Vascular;  Laterality: Left;   VENOGRAM Left 09/17/2017   Procedure: ULTRASOUND POPLITEAL ACCESS; CENTRAL VENOGRAM, IVVS, LYSIS CATHETER PLACEMENT;  Surgeon: Waynetta Sandy, MD;  Location: [place];  Service: Vascular;  Laterality: Left;   Family History: History reviewed. No pertinent family history. Family Psychiatric  History: Denies any Social History: Living situation: Lives with his father, brother and cousin Social support: Limited Marital Status: Never married Children: Patient reports he has 7 children 4 boys and 3 girls live with her mother in [place], all from same mother per patient's report Education: 9 grade Employment: Unemployed, reports on disability but unable to specify since when [employer] service: Denies Legal history: Denies pending charges or court dates but reports 3 weeks ago was in jail for physical assault but no pending court dates, reports was in prison in the past for 2 years for armed robbery and assault on Engineer, structural. Trauma: Denies Access to guns: Denies  Current Medications: Current Facility-Administered Medications  Medication Dose Route Frequency Provider Last Rate Last Admin   acetaminophen (TYLENOL) tablet 650 mg  650 mg Oral Q4H PRN Vesta Mixer, NP   650 mg at 12/28/21 0830   alum & mag hydroxide-simeth (MAALOX/MYLANTA) 200-200-20 MG/5ML suspension 30 mL  30 mL Oral Q6H PRN Vesta Mixer, NP       amLODipine (NORVASC) tablet 5 mg  5 mg Oral Daily Vesta Mixer, NP   5 mg at 12/29/21 0830   benztropine (COGENTIN) tablet 1 mg  1 mg Oral BID Vesta Mixer, NP   1 mg at 12/29/21 0830   divalproex (DEPAKOTE ER) 24 hr tablet 500 mg  500 mg Oral BID Vesta Mixer, NP   500 mg at 12/29/21 [id number]   gabapentin (NEURONTIN) capsule 400 mg  400 mg Oral TID Vesta Mixer, NP   400 mg at 12/29/21 [id number]   haloperidol (HALDOL) tablet 5  mg  5 mg Oral BID Winfred Leeds, Deavon Podgorski, MD   5 mg at 12/29/21 0831   risperiDONE (RISPERDAL M-TABS) disintegrating tablet 2 mg  2 mg Oral Q8H PRN Vesta Mixer, NP       And   LORazepam (ATIVAN) tablet 1 mg  1 mg Oral PRN Vesta Mixer, NP       And   ziprasidone (GEODON) injection 20 mg  20 mg Intramuscular PRN Vesta Mixer, NP       magnesium hydroxide (MILK OF MAGNESIA) suspension 30 mL  30 mL Oral Daily PRN Vesta Mixer, NP       nicotine (NICODERM CQ - dosed in mg/24 hours) patch 21 mg  21 mg Transdermal Daily Vesta Mixer, NP       ondansetron (ZOFRAN) tablet 4 mg  4 mg Oral Q8H PRN Vesta Mixer, NP       propranolol (INDERAL) tablet 10 mg  10 mg Oral Daily Vesta Mixer, NP   10 mg at 12/29/21 [id number]   traZODone (DESYREL) tablet 50 mg  50 mg Oral QHS Vesta Mixer, NP   50 mg at 12/28/21 2033   [START ON 01/02/2022] Vitamin D (Ergocalciferol) (DRISDOL) 1.25 MG (50000 UNIT) capsule 50,000 Units  50,000 Units Oral Weekly Vesta Mixer, NP        Lab Results: No results found for this or  any previous visit (from the past 48 hour(s)).  Blood Alcohol level:  Lab Results  Component Value Date   ETH <10 12/03/2021   ETH <10 11/24/2021    Metabolic Disorder Labs: Lab Results  Component Value Date   HGBA1C 6.1 (H) 06/28/2021   MPG 128 06/28/2021   No results found for: "PROLACTIN" Lab Results  Component Value Date   CHOL 164 07/04/2021   TRIG 139 07/04/2021   HDL 44 07/04/2021   CHOLHDL 3.7 07/04/2021   VLDL 28 07/04/2021   LDLCALC 92 07/04/2021   LDLCALC 102 (H) 02/11/2021    Physical Findings: AIMS: Facial and Oral Movements Muscles of Facial Expression: None, normal Lips and Perioral Area: None, normal Jaw: None, normal Tongue: None, normal,Extremity Movements Upper (arms, wrists, hands, fingers): Mild Lower (legs, knees, ankles, toes): Mild, Trunk Movements Neck, shoulders, hips: Mild, Overall Severity Severity of abnormal movements (highest  score from questions above): Minimal Incapacitation due to abnormal movements: Minimal Patient's awareness of abnormal movements (rate only patient's report): Aware, no distress, Dental Status Current problems with teeth and/or dentures?: No Does patient usually wear dentures?: No  CIWA:    COWS:     Musculoskeletal: Strength & Muscle Tone: within normal limits Gait & Station: normal Patient leans: N/A  Psychiatric Specialty Exam:  General Appearance: Better hygiene noted, dressed in hospital scrubs, appears older than stated age Involuntary upper extremities and lower extremities movement noted mild in severity.  Behavior: Calm and cooperative  Psychomotor Activity: No agitation or retardation noted  Eye Contact: Fair Speech: Normal amount tone and volume   Mood: Euthymic Affect: Congruent, restricted  Thought Process: Linear, concrete Descriptions of Associations: Intact but occasionally disorganized Thought Content: Hallucinations: Admits to auditory hallucination and responding to stimuli, denies visual hallucinations Delusions: No paranoia noted Suicidal Thoughts: Denies SI, intention, plan  Homicidal Thoughts: Denies HI, intention, plan   Alertness/Orientation: Alert, partially oriented  Insight: Limited Judgment: Limited  Memory: Limited  Executive Functions  Concentration: Limited, easily distracted Attention Span: Limited Recall: Limited Fund of Knowledge: Limited  Assets  Assets:Communication Skills; Desire for Improvement; Physical Health    Physical Exam: Physical Exam ROS Blood pressure 109/69, pulse (!) 59, temperature 98 F (36.7 C), temperature source Oral, resp. rate 16, height 6' (1.829 m), weight 82.1 kg, SpO2 98 %. Body mass index is 24.55 kg/m.   Treatment Plan Summary:  ASSESSMENT:  Diagnoses / Active Problems: Principal Problem: Schizophrenia (H[facility]) Diagnosis: Principal Problem:   Schizophrenia (H[facility])   PLAN: Safety and  Monitoring:             -- Involuntary admission to inpatient psychiatric unit for safety, stabilization and treatment             -- Daily contact with patient to assess and evaluate symptoms and progress in treatment             -- Patient's case to be discussed in multi-disciplinary team meeting             -- Observation Level : q15 minute checks             -- Vital signs:  q12 hours             -- Precautions: suicide, elopement, and assault   2. Medications:             Continue Depakote 500 mg twice daily for mood stabilization and agitation  Continue Haldol 5 mg twice daily for psychosis, monitor efficacy and safety and titrate accordingly, consider Haldol decanoate injection to help with long-term compliance if effective and well-tolerated             Continue Cogentin 1 mg twice daily for EPS             Continue Neurontin 400 mg 3 times daily for mood and anxiety             Discontinue Inderal given bradycardia noted            Titrate trazodone from 50-150 mg at bedtime for sleep             Continue Norvasc 5 mg daily for hypertension, consider titrating up to 10 mg if needed             Continue vitamin D weekly for vitamin D deficiency             Risperidone, Ativan and Geodon as needed for episodic psychotic agitation               RPR reactive, titer 1:2, Patient denies h/o treatment for syphilis, patient is poor historian, consulted with ID over the phone, chart and case were reviewed, T pallidum Abs negative, no need for treatment at this time, results consistent with false positive result    The risks/benefits/side-effects/alternatives to this medication were discussed in detail with the patient and time was given for questions. The patient consents to medication trial.                -- Metabolic profile and EKG monitoring obtained while on an atypical antipsychotic (BMI: Lipid Panel: HbgA1c: QTc:)                            3. Labs Reviewed:  Comprehensive metabolic panel no significant abnormalities noted, CBC no significant abnormalities noted, Depakote level 18 below therapeutic on 8/18 and 59 on 7/30, RPR reactive, T Pallidum Abs negative, titer 1:2 8/15, HIV negative 8/18, UA no significant abnormalities noted, urine drug screen -8/18, EKG 7/31 QTc 423 MRI brain 7/20 no abnormalities noted      Lab ordered: Hemoglobin A1c, fasting lipid panel ordered 8/23 Depakote level, LFT and ammonia level on 8/26 to ensure therapeutic level and no toxicity   4. Tobacco Use Disorder             -- Nicotine patch 21mg /24 hours ordered             -- Smoking cessation encouraged   5. Group and Therapy: -- Encouraged patient to participate in unit milieu and in scheduled group therapies                 6. Discharge Planning:              -- Social work and case management to assist with discharge planning and identification of hospital follow-up needs prior to discharge             -- Estimated LOS: 5-7 days             -- Discharge Concerns: Need to establish a safety plan; Medication compliance and effectiveness             -- Discharge Goals: Return home with outpatient referrals for mental health follow-up including medication management/psychotherapy     The patient is agreeable with the medication plan, as above.  We will monitor the patient's response to pharmacologic treatment, and adjust medications as necessary. Patient is encouraged to participate in group therapy while admitted to the psychiatric unit. We will address other chronic and acute stressors, which contributed to the patient's worsening psychosis and physical aggression, in order to reduce the risk of self-harm at discharge.     Physician Treatment Plan for Primary Diagnosis: Schizophrenia ([place]) Long Term Goal(s): Improvement in symptoms so as ready for discharge   Short Term Goals: Ability to identify changes in lifestyle to reduce recurrence of condition will  improve, Ability to verbalize feelings will improve, Ability to disclose and discuss suicidal ideas, Ability to demonstrate self-control will improve, Ability to identify and develop effective coping behaviors will improve, Ability to maintain clinical measurements within normal limits will improve, and Compliance with prescribed medications will improve     Total Time Spent in Direct Patient Care:  I personally spent 35 minutes on the unit in direct patient care. The direct patient care time included face-to-face time with the patient, reviewing the patient's chart, communicating with other professionals, and coordinating care. Greater than 50% of this time was spent in counseling or coordinating care with the patient regarding goals of hospitalization, psycho-education, and discharge planning needs.   Ariyana Faw Winfred Leeds, MD 12/29/2021, 11:32 AM

## 2021-12-29 NOTE — Progress Notes (Signed)
   12/29/21 2000  [facility] Fall Risk Assessment  Age 61  Mental Status 0  Physical Satus 0  Elimination 0  Sensory Impairments 0  Gait or Balance 0  History or falls in past 6 months 0  Mood Stabilizer Medications 0  Benzodiazepines 2  Narcotics 0  Sedatives/Hypnotics 1  Atypical Anti Psychotics 1  Detox Protocol (Alcohol, Narcotics, etc.) 0  Total Score 5  Patient Fall Risk Level Low fall risk  Required Bundle Interventions *See Row Information* Low fall risk - low requirements implemented  Screening for Fall Injury Risk (To be completed on HIGH fall risk patients) - Assessing Need for Floor Mats  Risk For Fall Injury- Criteria for Floor Mats None identified - No additional interventions needed  Safe Patient Handling Required Documentation-Repositioning Needs  Assist with movement in bed No  Fragile skin with pressure ulcer No  Unresponsive No  Safe Patient Handling Assessment  Ambulates independently Yes, no lift needed

## 2021-12-29 NOTE — BH IP Treatment Plan (Signed)
Interdisciplinary Treatment and Diagnostic Plan Update  12/29/2021 Time of Session: 9:25am  Mark [name] MRN: [id number]  Principal Diagnosis: Schizophrenia [facility])  Secondary Diagnoses: Principal Problem:   Schizophrenia ([name])   Current Medications:  Current Facility-Administered Medications  Medication Dose Route Frequency Provider Last Rate Last Admin   acetaminophen (TYLENOL) tablet 650 mg  650 mg Oral Q4H PRN Vesta Mixer, NP   650 mg at 12/28/21 0830   alum & mag hydroxide-simeth (MAALOX/MYLANTA) 200-200-20 MG/5ML suspension 30 mL  30 mL Oral Q6H PRN Vesta Mixer, NP       amLODipine (NORVASC) tablet 5 mg  5 mg Oral Daily Vesta Mixer, NP   5 mg at 12/29/21 0830   benztropine (COGENTIN) tablet 1 mg  1 mg Oral BID Vesta Mixer, NP   1 mg at 12/29/21 0830   divalproex (DEPAKOTE ER) 24 hr tablet 500 mg  500 mg Oral BID Vesta Mixer, NP   500 mg at 12/29/21 0102   gabapentin (NEURONTIN) capsule 400 mg  400 mg Oral TID Vesta Mixer, NP   400 mg at 12/29/21 1213   haloperidol (HALDOL) tablet 5 mg  5 mg Oral BID Winfred Leeds, Nadir, MD   5 mg at 12/29/21 0831   risperiDONE (RISPERDAL M-TABS) disintegrating tablet 2 mg  2 mg Oral Q8H PRN Vesta Mixer, NP       And   LORazepam (ATIVAN) tablet 1 mg  1 mg Oral PRN Vesta Mixer, NP       And   ziprasidone (GEODON) injection 20 mg  20 mg Intramuscular PRN Vesta Mixer, NP       magnesium hydroxide (MILK OF MAGNESIA) suspension 30 mL  30 mL Oral Daily PRN Vesta Mixer, NP       nicotine (NICODERM CQ - dosed in mg/24 hours) patch 21 mg  21 mg Transdermal Daily Vesta Mixer, NP       ondansetron (ZOFRAN) tablet 4 mg  4 mg Oral Q8H PRN Vesta Mixer, NP       traZODone (DESYREL) tablet 150 mg  150 mg Oral QHS Dian Situ, MD       [START ON 01/02/2022] Vitamin D (Ergocalciferol) (DRISDOL) 1.25 MG (50000 UNIT) capsule 50,000 Units  50,000 Units Oral Weekly Vesta Mixer, NP       PTA  Medications: Medications Prior to Admission  Medication Sig Dispense Refill Last Dose   amLODipine (NORVASC) 5 MG tablet Take 1 tablet (5 mg total) by mouth daily. 30 tablet 0    aspirin EC 81 MG tablet Take 81 mg by mouth daily. Swallow whole.      benztropine (COGENTIN) 1 MG tablet Take 1 tablet (1 mg total) by mouth 2 (two) times daily. 60 tablet 0    cloZAPine (CLOZARIL) 100 MG tablet Take 300 mg by mouth at bedtime.      cyclobenzaprine (FLEXERIL) 10 MG tablet Take 10 mg by mouth 3 (three) times daily as needed for muscle spasms.      divalproex (DEPAKOTE ER) 500 MG 24 hr tablet Take 1 tablet (500 mg total) by mouth 2 (two) times daily. 60 tablet 0    ergocalciferol (VITAMIN D2) 1.25 MG (50000 UT) capsule Take 50,000 Units by mouth once a week.      gabapentin (NEURONTIN) 100 MG capsule Take 100 mg by mouth daily.      gabapentin (NEURONTIN) 400 MG capsule Take 400 mg by mouth 3 (three) times daily.      gabapentin (NEURONTIN) 400 MG capsule Take  400 mg by mouth 3 (three) times daily.      haloperidol (HALDOL) 5 MG tablet Take 1 tablet (5 mg total) by mouth 2 (two) times daily. 60 tablet 0    hydrOXYzine (VISTARIL) 25 MG capsule Take 2 capsules by mouth 2 (two) times daily.      metoprolol tartrate (LOPRESSOR) 50 MG tablet Take 1 tablet (50 mg total) by mouth 2 (two) times daily. 60 tablet 0    omeprazole (PRILOSEC) 20 MG capsule Take 20 mg by mouth daily.      pantoprazole (PROTONIX) 20 MG tablet Take 1 tablet (20 mg total) by mouth daily. 30 tablet 0    propranolol (INDERAL) 10 MG tablet Take 1 tablet (10 mg total) by mouth daily. 30 tablet 0    traZODone (DESYREL) 50 MG tablet Take 50 mg by mouth at bedtime.       Patient Stressors: Marital or family conflict   Medication change or noncompliance    Patient Strengths: Motivation for treatment/growth  Supportive family/friends   Treatment Modalities: Medication Management, Group therapy, Case management,  1 to 1 session with  clinician, Psychoeducation, Recreational therapy.   Physician Treatment Plan for Primary Diagnosis: Schizophrenia ([name]) Long Term Goal(s): Improvement in symptoms so as ready for discharge   Short Term Goals: Ability to identify changes in lifestyle to reduce recurrence of condition will improve Ability to verbalize feelings will improve Ability to disclose and discuss suicidal ideas Ability to demonstrate self-control will improve Ability to identify and develop effective coping behaviors will improve Ability to maintain clinical measurements within normal limits will improve Compliance with prescribed medications will improve  Medication Management: Evaluate patient's response, side effects, and tolerance of medication regimen.  Therapeutic Interventions: 1 to 1 sessions, Unit Group sessions and Medication administration.  Evaluation of Outcomes: Not Met  Physician Treatment Plan for Secondary Diagnosis: Principal Problem:   Schizophrenia ([name])  Long Term Goal(s): Improvement in symptoms so as ready for discharge   Short Term Goals: Ability to identify changes in lifestyle to reduce recurrence of condition will improve Ability to verbalize feelings will improve Ability to disclose and discuss suicidal ideas Ability to demonstrate self-control will improve Ability to identify and develop effective coping behaviors will improve Ability to maintain clinical measurements within normal limits will improve Compliance with prescribed medications will improve     Medication Management: Evaluate patient's response, side effects, and tolerance of medication regimen.  Therapeutic Interventions: 1 to 1 sessions, Unit Group sessions and Medication administration.  Evaluation of Outcomes: Not Met   RN Treatment Plan for Primary Diagnosis: Schizophrenia ([name]) Long Term Goal(s): Knowledge of disease and therapeutic regimen to maintain health will improve  Short Term Goals: Ability to  remain free from injury will improve, Ability to participate in decision making will improve, Ability to verbalize feelings will improve, Ability to disclose and discuss suicidal ideas, and Ability to identify and develop effective coping behaviors will improve  Medication Management: RN will administer medications as ordered by provider, will assess and evaluate patient's response and provide education to patient for prescribed medication. RN will report any adverse and/or side effects to prescribing provider.  Therapeutic Interventions: 1 on 1 counseling sessions, Psychoeducation, Medication administration, Evaluate responses to treatment, Monitor vital signs and CBGs as ordered, Perform/monitor CIWA, COWS, AIMS and Fall Risk screenings as ordered, Perform wound care treatments as ordered.  Evaluation of Outcomes: Not Met   LCSW Treatment Plan for Primary Diagnosis: Schizophrenia ([place]) Long Term Goal(s): Safe transition  to appropriate next level of care at discharge, Engage patient in therapeutic group addressing interpersonal concerns.  Short Term Goals: Engage patient in aftercare planning with referrals and resources, Increase social support, Increase emotional regulation, Facilitate acceptance of mental health diagnosis and concerns, Identify triggers associated with mental health/substance abuse issues, and Increase skills for wellness and recovery  Therapeutic Interventions: Assess for all discharge needs, 1 to 1 time with Social worker, Explore available resources and support systems, Assess for adequacy in community support network, Educate family and significant other(s) on suicide prevention, Complete Psychosocial Assessment, Interpersonal group therapy.  Evaluation of Outcomes: Not Met   Progress in Treatment: Attending groups: Yes. Participating in groups: Yes. Taking medication as prescribed: Yes. Toleration medication: Yes. Family/Significant other contact made: Yes,  individual(s) contacted:  Father  Patient understands diagnosis: No. Discussing patient identified problems/goals with staff: Yes. Medical problems stabilized or resolved: Yes. Denies suicidal/homicidal ideation: Yes. Issues/concerns per patient self-inventory: No.   New problem(s) identified: No, Describe:  None   New Short Term/Long Term Goal(s): medication stabilization, elimination of SI thoughts, development of comprehensive mental wellness plan.   Patient Goals: "Get back into [facility] and to be healthy"  Discharge Plan or Barriers: Patient recently admitted. CSW will continue to follow and assess for appropriate referrals and possible discharge planning.   Reason for Continuation of Hospitalization: Anxiety Hallucinations Medication stabilization  Estimated Length of Stay: 3 to 7 days   Last 3 [place] Suicide Severity Risk Score: [place] Admission (Current) from 12/27/2021 in [facility] Most recent reading at 12/27/2021 10:15 PM ED from 12/23/2021 in [place] Most recent reading at 12/23/2021  2:33 PM ED from 12/23/2021 in [facility] Most recent reading at 12/23/2021  1:29 PM  C-SSRS RISK CATEGORY No Risk No Risk No Risk       Last PHQ 2/9 Scores:     No data to display          Scribe for Treatment Team: Carney Harder 12/29/2021 2:43 PM

## 2021-12-29 NOTE — Progress Notes (Signed)
Adult Psychoeducational Group Note  Date:  12/29/2021 Time:  9:34 AM  Group Topic/Focus:  Goals Group:   The focus of this group is to help patients establish daily goals to achieve during treatment and discuss how the patient can incorporate goal setting into their daily lives to aide in recovery.  Participation Level:  Minimal  Participation Quality:  Resistant  Additional Comments:  When asked about a goal today the pt said, "Just put down anything."  Margaret Pyle 12/29/2021, 9:34 AM

## 2021-12-30 MED ORDER — HALOPERIDOL 5 MG PO TABS
5.0000 mg | ORAL_TABLET | Freq: Every day | ORAL | Status: DC
  Administered 2021-12-31 – 2022-01-02 (×3): 5 mg via ORAL
  Filled 2021-12-30 (×5): qty 1

## 2021-12-30 MED ORDER — HALOPERIDOL 5 MG PO TABS
10.0000 mg | ORAL_TABLET | Freq: Every day | ORAL | Status: DC
  Administered 2021-12-30 – 2022-01-01 (×3): 10 mg via ORAL
  Filled 2021-12-30 (×4): qty 2

## 2021-12-30 NOTE — Progress Notes (Signed)
   12/30/21 2000  Psych Admission Type (Psych Patients Only)  Admission Status Involuntary  Psychosocial Assessment  Patient Complaints Anxiety  Eye Contact Fair  Facial Expression Anxious  Affect Anxious  Speech Soft  Interaction Assertive  Motor Activity Slow  Appearance/Hygiene In scrubs  Behavior Characteristics Cooperative  Mood Suspicious;Preoccupied  Aggressive Behavior  Effect No apparent injury  Thought Process  Coherency WDL  Content WDL  Delusions None reported or observed  Perception Hallucinations  Hallucination Auditory  Judgment WDL  Confusion WDL  Danger to Self  Current suicidal ideation? Denies

## 2021-12-30 NOTE — [facility] Suicide Risk Assessment (Signed)
[facility] INPATIENT:  Family/Significant Other Suicide Prevention Education  Suicide Prevention Education:  Contact Attempts: Dacen Frayre, [phone] and father, (name of family member/significant other) has been identified by the patient as the family member/significant other with whom the patient will be residing, and identified as the person(s) who will aid the patient in the event of a mental health crisis.  With written consent from the patient, two attempts were made to provide suicide prevention education, prior to and/or following the patient's discharge.  We were unsuccessful in providing suicide prevention education.  A suicide education pamphlet was given to the patient to share with family/significant other.  Date and time of first attempt:12/28/2021 / 3pm Date and time of second attempt:12/29/2021 /10:13am  Lakesha Levinson E Giani Betzold 12/30/2021, 9:05 AM

## 2021-12-30 NOTE — Progress Notes (Signed)
   12/30/21 0500  Sleep  Number of Hours 7

## 2021-12-30 NOTE — [facility] Group Notes (Signed)
Adult Psychoeducational Group Note  Date:  12/30/2021 Time:  8:11 PM  Group Topic/Focus:  Wrap-Up Group:   The focus of this group is to help patients review their daily goal of treatment and discuss progress on daily workbooks.  Participation Level:  Active  Participation Quality:  Attentive  Affect:  Appropriate  Cognitive:  Alert  Insight: Appropriate  Engagement in Group:  Engaged  Modes of Intervention:  Discussion  Additional Comments:  Patient attended and participated in the Wrap-up group.l  Kyren Vaux J Wilberth Damon 12/30/2021, 8:11 PM

## 2021-12-30 NOTE — BH Assessment (Signed)
CSW sent referral to peer support services with [employer] [facility] [place].    Jolly Bleicher, LCSW, LCAS Clincal Social Worker  [facility]

## 2021-12-30 NOTE — Progress Notes (Signed)
Patient did not attend morning orientation/goal setting group 

## 2021-12-30 NOTE — Progress Notes (Signed)
[facility] MD Progress Note  12/30/2021 2:32 PM [name]  MRN:  [id number]   Reason for Admission:  MASAJI [name] is a 61 y.o., male with a past psychiatric history significant for schizophrenia who presents to the [facility] from [name] emergency room for evaluation and management of increased psychosis and agitation. The patient is currently on [name] Day 3.   Chart Review from last 24 hours:  The patient's chart was reviewed and nursing notes were reviewed. The patient's case was discussed in multidisciplinary team meeting. Per [name] patient is compliant with his scheduled medications in the [name] including Cogentin and Depakote Neurontin Haldol trazodone Inderal and Norvasc.  He did not need and was not given any as needed for agitation or aggression and did not require any as needed Ativan for anxiety or agitation.  Staff report patient with less episodes of responding to stimuli or talking to himself having more lucid interval and organized thought process reported by staff.  Information Obtained Today During Patient Interview: The patient was seen and evaluated on the unit. On assessment today the patient lying down in bed reporting had good day yesterday played basketball and felt happy about it, also reportedly he took shower twice yesterday and maintaining good hygiene, compliant with medications and he denies side effect with them and during evaluation he does not demonstrate any signs or symptoms consistent with EPS, involuntary movement upper and lower extremities as well as tremors have improved significantly during evaluation.  He denies today any auditory or visual hallucination and continues to deny SI or HI, reports good sleep and appetite presents with organized thought process with no disorganized thought process noted during evaluation and no incident of responding to stimuli or talking to himself noted during evaluation.   Discussed with patient we will  titrate night dose of Haldol to target better control of his symptoms and continue Depakote at current dose, level pending on 8/26.   Sleep  Sleep: good, improved per patient's report  Principal Problem: Schizophrenia ([place]) Diagnosis: Principal Problem:   Schizophrenia ([place])    Past Psychiatric History: Prior Psychiatric diagnoses: Schizophrenia Past Psychiatric Hospitalizations: At least twice last time February 2023 at this facility   History of self mutilation: Denies Past suicide attempts: Denies Past history of HI, violent or aggressive behavior: History of aggression towards family members as well as history of assault charges 3 weeks ago and patient reports was in prison for 2 years for armed robbery and physical assault in the past   Past Psychiatric medications trials: Depakote, clozapine, Haldol, Cogentin, gabapentin, trazodone, Vistaril with varying efficacy History of ECT/TMS: Denies   Outpatient psychiatric Follow up: Unknown but reported lack of compliance Prior Outpatient Therapy: Unknown  Past Medical History:  Past Medical History:  Diagnosis Date   COPD (chronic obstructive pulmonary disease) ([place])    Hypertension    Schizophrenia ([name])     Past Surgical History:  Procedure Laterality Date   LOWER EXTREMITY ANGIOGRAPHY N/A 09/18/2017   Procedure: LOWER EXTREMITY ANGIOGRAPHY- Recheck lysis;  Surgeon: Waynetta Sandy, MD;  Location: [place] [facility];  Service: Cardiovascular;  Laterality: N/A;   PERIPHERAL VASCULAR INTERVENTION Left 09/18/2017   Procedure: PERIPHERAL VASCULAR INTERVENTION;  Surgeon: Waynetta Sandy, MD;  Location: [place] [facility];  Service: Cardiovascular;  Laterality: Left;   THROMBECTOMY FEMORAL ARTERY Left 09/17/2017   Procedure: POSSIBLE THROMBECTOMY;  Surgeon: Waynetta Sandy, MD;  Location: [place];  Service: Vascular;  Laterality: Left;  VENOGRAM Left 09/17/2017   Procedure: ULTRASOUND POPLITEAL ACCESS;  CENTRAL VENOGRAM, IVVS, LYSIS CATHETER PLACEMENT;  Surgeon: Waynetta Sandy, MD;  Location: [place];  Service: Vascular;  Laterality: Left;   Family History: History reviewed. No pertinent family history. Family Psychiatric  History: Denies any Social History: Living situation: Lives with his father, brother and cousin Social support: Limited Marital Status: Never married Children: Patient reports he has 7 children 4 boys and 3 girls live with her mother in [name], all from same mother per patient's report Education: 9 grade Employment: Unemployed, reports on disability but unable to specify since when [employer] service: Denies Legal history: Denies pending charges or court dates but reports 3 weeks ago was in jail for physical assault but no pending court dates, reports was in prison in the past for 2 years for armed robbery and assault on [name], structural. Trauma: Denies Access to guns: Denies  Current Medications: Current Facility-Administered Medications  Medication Dose Route Frequency Provider Last Rate Last Admin   acetaminophen (TYLENOL) tablet 650 mg  650 mg Oral Q4H PRN Vesta Mixer, NP   650 mg at 12/28/21 0830   alum & mag hydroxide-simeth (MAALOX/MYLANTA) 200-200-20 MG/5ML suspension 30 mL  30 mL Oral Q6H PRN Vesta Mixer, NP       amLODipine (NORVASC) tablet 5 mg  5 mg Oral Daily Vesta Mixer, NP   5 mg at 12/30/21 0855   benztropine (COGENTIN) tablet 1 mg  1 mg Oral BID Vesta Mixer, NP   1 mg at 12/30/21 0855   divalproex (DEPAKOTE ER) 24 hr tablet 500 mg  500 mg Oral BID Vesta Mixer, NP   500 mg at 12/30/21 0855   gabapentin (NEURONTIN) capsule 400 mg  400 mg Oral TID Vesta Mixer, NP   400 mg at 12/30/21 1302   haloperidol (HALDOL) tablet 5 mg  5 mg Oral BID Winfred Leeds, Stesha Neyens, MD   5 mg at 12/30/21 0855   risperiDONE (RISPERDAL M-TABS) disintegrating tablet 2 mg  2 mg Oral Q8H PRN Vesta Mixer, NP       And   LORazepam (ATIVAN) tablet  1 mg  1 mg Oral PRN Vesta Mixer, NP       And   ziprasidone (GEODON) injection 20 mg  20 mg Intramuscular PRN Vesta Mixer, NP       magnesium hydroxide (MILK OF MAGNESIA) suspension 30 mL  30 mL Oral Daily PRN Vesta Mixer, NP       nicotine (NICODERM CQ - dosed in mg/24 hours) patch 21 mg  21 mg Transdermal Daily Vesta Mixer, NP       ondansetron (ZOFRAN) tablet 4 mg  4 mg Oral Q8H PRN Vesta Mixer, NP       traZODone (DESYREL) tablet 150 mg  150 mg Oral QHS Winfred Leeds, Annalaya Wile, MD   150 mg at 12/29/21 2035   [START ON 01/02/2022] Vitamin D (Ergocalciferol) (DRISDOL) 1.25 MG (50000 UNIT) capsule 50,000 Units  50,000 Units Oral Weekly Vesta Mixer, NP        Lab Results:  Results for orders placed or performed during the [name] encounter of 12/27/21 (from the past 48 hour(s))  Hemoglobin A1c     Status: Abnormal   Collection Time: 12/29/21  6:39 PM  Result Value Ref Range   Hgb A1c MFr Bld 6.3 (H) 4.8 - 5.6 %    Comment: (NOTE) Pre diabetes:          5.7%-6.4%  Diabetes:              >  6.4%  Glycemic control for   <7.0% adults with diabetes    Mean Plasma Glucose 134.11 mg/dL    Comment: Performed at [facility], [address], [place], [place] [address]  Lipid panel     Status: Abnormal   Collection Time: 12/29/21  6:39 PM  Result Value Ref Range   Cholesterol 140 0 - 200 mg/dL   Triglycerides 846 <962 mg/dL   HDL 36 (L) >95 mg/dL   Total CHOL/HDL Ratio 3.9 RATIO   VLDL 27 0 - 40 mg/dL   LDL Cholesterol 77 0 - 99 mg/dL    Comment:        Total Cholesterol/HDL:CHD Risk Coronary Heart Disease Risk Table                     Men   Women  1/2 Average Risk   3.4   3.3  Average Risk       5.0   4.4  2 X Average Risk   9.6   7.1  3 X Average Risk  23.4   11.0        Use the calculated Patient Ratio above and the CHD Risk Table to determine the patient's CHD Risk.        ATP III CLASSIFICATION (LDL):  <100     mg/dL   Optimal  284-132  mg/dL    Near or Above                    Optimal  130-159  mg/dL   Borderline  440-102  mg/dL   High  >725     mg/dL   Very High Performed at [facility], [address], [place], [place] [address]     Blood Alcohol level:  Lab Results  Component Value Date   ETH <10 12/03/2021   ETH <10 11/24/2021    Metabolic Disorder Labs: Lab Results  Component Value Date   HGBA1C 6.3 (H) 12/29/2021   MPG 134.11 12/29/2021   MPG 128 06/28/2021   No results found for: "PROLACTIN" Lab Results  Component Value Date   CHOL 140 12/29/2021   TRIG 134 12/29/2021   HDL 36 (L) 12/29/2021   CHOLHDL 3.9 12/29/2021   VLDL 27 12/29/2021   LDLCALC 77 12/29/2021   LDLCALC 92 07/04/2021    Physical Findings: AIMS: Facial and Oral Movements Muscles of Facial Expression: None, normal Lips and Perioral Area: None, normal Jaw: None, normal Tongue: None, normal,Extremity Movements Upper (arms, wrists, hands, fingers): Mild Lower (legs, knees, ankles, toes): Minimal, Trunk Movements Neck, shoulders, hips: Mild, Overall Severity Severity of abnormal movements (highest score from questions above): Minimal Incapacitation due to abnormal movements: None, normal Patient's awareness of abnormal movements (rate only patient's report): Aware, no distress, Dental Status Current problems with teeth and/or dentures?: No (missing) Does patient usually wear dentures?: No  CIWA:    COWS:     Musculoskeletal: Strength & Muscle Tone: within normal limits Gait & Station: normal Patient leans: N/A  Psychiatric Specialty Exam:  General Appearance: Better hygiene noted, dressed in [name] scrubs, appears older than stated age Involuntary upper extremities and lower extremities movement noted mild in severity.  Behavior: Calm and cooperative  Psychomotor Activity: No agitation or retardation noted  Eye Contact: Fair Speech: Normal amount tone and volume   Mood: Euthymic Affect: Congruent,  restricted  Thought Process: Linear, concrete Descriptions of Associations: Intact with much more organized and linear thought  process noted Thought Content: Hallucinations: Denies AVH, does not appear responding to stimuli during evaluation Delusions: No paranoia noted Suicidal Thoughts: Denies SI, intention, plan  Homicidal Thoughts: Denies HI, intention, plan   Alertness/Orientation: Alert, partially oriented  Insight: Improved Judgment: Improved  Memory: Limited  Executive Functions  Concentration: Limited, easily distracted Attention Span: Limited Recall: Limited Fund of Knowledge: Limited  Assets  Assets:Communication Skills; Desire for Improvement; Physical Health    Physical Exam: Physical Exam ROS Blood pressure 129/71, pulse 67, temperature 98.2 F (36.8 C), temperature source Oral, resp. rate 20, height 6' (1.829 m), weight 82.1 kg, SpO2 100 %. Body mass index is 24.55 kg/m.   Treatment Plan Summary:  ASSESSMENT:  Diagnoses / Active Problems: Principal Problem: Schizophrenia (HCC) Diagnosis: Principal Problem:   Schizophrenia (HCC)   PLAN: Safety and Monitoring:             -- Involuntary admission to inpatient psychiatric unit for safety, stabilization and treatment             -- Daily contact with patient to assess and evaluate symptoms and progress in treatment             -- Patient's case to be discussed in multi-disciplinary team meeting             -- Observation Level : q15 minute checks             -- Vital signs:  q12 hours             -- Precautions: suicide, elopement, and assault   2. Medications:             Continue Depakote 500 mg twice daily for mood stabilization and agitation                         Continue Haldol but change dosing from 5 mg twice daily to 5 mg in the morning and 10 mg at night to target better control of psychosis, monitor efficacy and safety and titrate accordingly, consider Haldol decanoate injection to help  with long-term compliance if effective and well-tolerated             Continue Cogentin 1 mg twice daily for EPS             Continue Neurontin 400 mg 3 times daily for mood and anxiety             Discontinue Inderal given bradycardia noted            Continue trazodone150 mg at bedtime for sleep             Continue Norvasc 5 mg daily for hypertension, consider titrating up to 10 mg if needed             Continue vitamin D weekly for vitamin D deficiency             Risperidone, Ativan and Geodon as needed for episodic psychotic agitation               RPR reactive, titer 1:2, Patient denies h/o treatment for syphilis, patient is poor historian, consulted with ID over the phone, chart and case were reviewed, T pallidum Abs negative, no need for treatment at this time, results consistent with false positive result    The risks/benefits/side-effects/alternatives to this medication were discussed in detail with the patient and time was given for questions. The patient consents to medication trial.                --  Metabolic profile and EKG monitoring obtained while on an atypical antipsychotic (BMI: Lipid Panel: HbgA1c: QTc:)                            3. Labs Reviewed: Comprehensive metabolic panel no significant abnormalities noted, CBC no significant abnormalities noted, Depakote level 18 below therapeutic on 8/18 and 59 on 7/30, RPR reactive, T Pallidum Abs negative, titer 1:2 8/15, HIV negative 8/18, UA no significant abnormalities noted, urine drug screen -8/18, EKG 7/31 QTc 423 MRI brain 7/20 no abnormalities noted Lipid panel 8/23 within normal level except for low HDL 36, hemoglobin A1c elevated 6.3     Lab ordered:  Depakote level, LFT and ammonia level on 8/26 to ensure therapeutic level and no toxicity   4. Tobacco Use Disorder             -- Nicotine patch 21mg /24 hours ordered             -- Smoking cessation encouraged   5. Group and Therapy: -- Encouraged patient to  participate in unit milieu and in scheduled group therapies                 6. Discharge Planning:              -- Social work and case management to assist with discharge planning and identification of [name] follow-up needs prior to discharge             -- Estimated LOS: 5-7 days             -- Discharge Concerns: Need to establish a safety plan; Medication compliance and effectiveness             -- Discharge Goals: Return home with outpatient referrals for mental health follow-up including medication management/psychotherapy  At time of discharge recommend outpatient follow-up with primary care provider for prediabetic hemoglobin A1c to monitor and follow.   The patient is agreeable with the medication plan, as above. We will monitor the patient's response to pharmacologic treatment, and adjust medications as necessary. Patient is encouraged to participate in group therapy while admitted to the psychiatric unit. We will address other chronic and acute stressors, which contributed to the patient's worsening psychosis and physical aggression, in order to reduce the risk of self-harm at discharge.     Physician Treatment Plan for Primary Diagnosis: Schizophrenia ([place]) Long Term Goal(s): Improvement in symptoms so as ready for discharge   Short Term Goals: Ability to identify changes in lifestyle to reduce recurrence of condition will improve, Ability to verbalize feelings will improve, Ability to disclose and discuss suicidal ideas, Ability to demonstrate self-control will improve, Ability to identify and develop effective coping behaviors will improve, Ability to maintain clinical measurements within normal limits will improve, and Compliance with prescribed medications will improve     Total Time Spent in Direct Patient Care:  I personally spent 35 minutes on the unit in direct patient care. The direct patient care time included face-to-face time with the patient, reviewing the patient's  chart, communicating with other professionals, and coordinating care. Greater than 50% of this time was spent in counseling or coordinating care with the patient regarding goals of hospitalization, psycho-education, and discharge planning needs.   Ermelinda Eckert Winfred [place], MD 12/30/2021, 2:32 PM

## 2021-12-31 MED ORDER — HALOPERIDOL DECANOATE 100 MG/ML IM SOLN
100.0000 mg | Freq: Once | INTRAMUSCULAR | Status: AC
Start: 2021-12-31 — End: 2021-12-31
  Administered 2021-12-31: 100 mg via INTRAMUSCULAR
  Filled 2021-12-31: qty 1

## 2021-12-31 NOTE — Progress Notes (Addendum)
[facility] MD Progress Note  12/31/2021 10:38 AM Mark [name]  MRN:  [id number]   Reason for Admission:  Mark [name] is a 61 y.o., male with a past psychiatric history significant for schizophrenia who presents to the [facility] from [facility] emergency room for evaluation and management of increased psychosis and agitation. The patient is currently on Hospital Day 4.   Chart Review from last 24 hours:  The patient's chart was reviewed and nursing notes were reviewed. The patient's case was discussed in multidisciplinary team meeting. Per [facility] patient is compliant with his scheduled medications in the hospital including Cogentin and Depakote Neurontin Haldol trazodone Inderal and Norvasc.  He did not need and was not given any as needed for agitation or aggression and did not require any as needed Ativan for anxiety or agitation.  Staff report patient with less episodes of responding to stimuli or talking to himself having more lucid interval and organized thought process reported by staff.  Information Obtained Today During Patient Interview: The patient was seen and evaluated on the unit. On assessment today the patient continues to present linear with organized thought process, no disorganized thought process noted any further continues to deny SI HI or AVH but later tells me that he occasionally still talks to himself "it makes me laugh" but he is noted to be having much less episodes of talking to himself or responding to stimuli since admission.  He does agree that he is benefiting from taking his medicine in the hospital "my thinking is clear and I feel calm"  He reports better sleep at night contributing to better mood and denies depressed mood or anxiety.  He tells me he is enjoying playing basketball during gym time and also attending groups.    Unfortunately this provider and other staff have been unable, despite several attempts, to reach patient's father/guardian for  collateral information or obtain permission regarding Haldol decannulate injection.   Patient agrees with Haldol decannulate injection and given history of noncompliance with medication it is in patient's best interest to start him on Haldol decannulate injection today during this hospitalization, will follow.    Discussed with patient I will give him for his dose of Haldol decannulate 100 mg IM today to be followed by 50 mg in 3 days prior to discharge, patient agrees. We will continue Depakote at this time, Depakote level pending on 8/26.  Addendum: Spoke to patient's father over the phone at [phone], discussed circumstances and reason for admission including noncompliance with medication at home and agitation and aggression towards family members at home, discussed with patient's father patient was irritable and agitated as well as disorganized at time of admission but after started on medications including Haldol, Depakote and trazodone at bedtime he gradually is improving, getting more cooperative with no agitation noted during hospital stay and improving in regard to disorganized thought process and psychosis.  Discussed with patient's father starting Haldol decanoate to help with long-term compliance with Haldol but patient will continue to need to take Haldol p.o. for 2 weeks after discharge and also will have to continue taking Depakote by mouth, also discussed will refer to peers support after discharge to help ensuring patient has supplies of medications as well as able to make his follow-up appointments.  Discussed with patient's father if patient continues to improve and tolerates injections well plan to discharge sometime next week, father sounded understanding.  Sleep  Sleep: good, improved per patient's report  Principal Problem:  Schizophrenia (HCC) Diagnosis: Principal Problem:   Schizophrenia [facility])    Past Psychiatric History: Prior Psychiatric diagnoses: Schizophrenia Past  Psychiatric Hospitalizations: At least twice last time February 2023 at this facility   History of self mutilation: Denies Past suicide attempts: Denies Past history of HI, violent or aggressive behavior: History of aggression towards family members as well as history of assault charges 3 weeks ago and patient reports was in prison for 2 years for armed robbery and physical assault in the past   Past Psychiatric medications trials: Depakote, clozapine, Haldol, Cogentin, gabapentin, trazodone, Vistaril with varying efficacy History of ECT/TMS: Denies   Outpatient psychiatric Follow up: Unknown but reported lack of compliance Prior Outpatient Therapy: Unknown  Past Medical History:  Past Medical History:  Diagnosis Date   COPD (chronic obstructive pulmonary disease) (HCC)    Hypertension    Schizophrenia (HCC)     Past Surgical History:  Procedure Laterality Date   LOWER EXTREMITY ANGIOGRAPHY N/A 09/18/2017   Procedure: LOWER EXTREMITY ANGIOGRAPHY- Recheck lysis;  Surgeon: Maeola Harman, MD;  Location: [facility] INVASIVE CV LAB;  Service: Cardiovascular;  Laterality: N/A;   PERIPHERAL VASCULAR INTERVENTION Left 09/18/2017   Procedure: PERIPHERAL VASCULAR INTERVENTION;  Surgeon: Maeola Harman, MD;  Location: [facility] INVASIVE CV LAB;  Service: Cardiovascular;  Laterality: Left;   THROMBECTOMY FEMORAL ARTERY Left 09/17/2017   Procedure: POSSIBLE THROMBECTOMY;  Surgeon: Maeola Harman, MD;  Location: [facility] OR;  Service: Vascular;  Laterality: Left;   VENOGRAM Left 09/17/2017   Procedure: ULTRASOUND POPLITEAL ACCESS; CENTRAL VENOGRAM, IVVS, LYSIS CATHETER PLACEMENT;  Surgeon: Maeola Harman, MD;  Location: [facility] OR;  Service: Vascular;  Laterality: Left;   Family History: History reviewed. No pertinent family history. Family Psychiatric  History: Denies any Social History: Living situation: Lives with his father, brother and cousin Social support: Limited Marital  Status: Never married Children: Patient reports he has 7 children 4 boys and 3 girls live with her mother in [place], all from same mother per patient's report Education: 9 grade Employment: Unemployed, reports on disability but unable to specify since when [employer] service: Denies Legal history: Denies pending charges or court dates but reports 3 weeks ago was in jail for physical assault but no pending court dates, reports was in prison in the past for 2 years for armed robbery and assault on Emergency planning/management officer. Trauma: Denies Access to guns: Denies  Current Medications: Current Facility-Administered Medications  Medication Dose Route Frequency Provider Last Rate Last Admin   acetaminophen (TYLENOL) tablet 650 mg  650 mg Oral Q4H PRN Eligha Bridegroom, NP   650 mg at 12/30/21 2055   alum & mag hydroxide-simeth (MAALOX/MYLANTA) 200-200-20 MG/5ML suspension 30 mL  30 mL Oral Q6H PRN Eligha Bridegroom, NP       amLODipine (NORVASC) tablet 5 mg  5 mg Oral Daily Eligha Bridegroom, NP   5 mg at 12/31/21 0748   benztropine (COGENTIN) tablet 1 mg  1 mg Oral BID Eligha Bridegroom, NP   1 mg at 12/31/21 0748   divalproex (DEPAKOTE ER) 24 hr tablet 500 mg  500 mg Oral BID Eligha Bridegroom, NP   500 mg at 12/31/21 0748   gabapentin (NEURONTIN) capsule 400 mg  400 mg Oral TID Eligha Bridegroom, NP   400 mg at 12/31/21 0748   haloperidol (HALDOL) tablet 10 mg  10 mg Oral QHS Cheril Slattery, MD   10 mg at 12/30/21 2054   haloperidol (HALDOL) tablet 5 mg  5 mg Oral Daily  Dian Situ, MD   5 mg at 12/31/21 0748   risperiDONE (RISPERDAL M-TABS) disintegrating tablet 2 mg  2 mg Oral Q8H PRN Vesta Mixer, NP       And   LORazepam (ATIVAN) tablet 1 mg  1 mg Oral PRN Vesta Mixer, NP       And   ziprasidone (GEODON) injection 20 mg  20 mg Intramuscular PRN Vesta Mixer, NP       magnesium hydroxide (MILK OF MAGNESIA) suspension 30 mL  30 mL Oral Daily PRN Vesta Mixer, NP       ondansetron  (ZOFRAN) tablet 4 mg  4 mg Oral Q8H PRN Vesta Mixer, NP       traZODone (DESYREL) tablet 150 mg  150 mg Oral QHS Winfred Leeds, Ali Mclaurin, MD   150 mg at 12/30/21 2054   [START ON 01/02/2022] Vitamin D (Ergocalciferol) (DRISDOL) 1.25 MG (50000 UNIT) capsule 50,000 Units  50,000 Units Oral Weekly Vesta Mixer, NP        Lab Results:  Results for orders placed or performed during the hospital encounter of 12/27/21 (from the past 48 hour(s))  Hemoglobin A1c     Status: Abnormal   Collection Time: 12/29/21  6:39 PM  Result Value Ref Range   Hgb A1c MFr Bld 6.3 (H) 4.8 - 5.6 %    Comment: (NOTE) Pre diabetes:          5.7%-6.4%  Diabetes:              >6.4%  Glycemic control for   <7.0% adults with diabetes    Mean Plasma Glucose 134.11 mg/dL    Comment: Performed at [facility], [place] [address] [place], [place] [address]  Lipid panel     Status: Abnormal   Collection Time: 12/29/21  6:39 PM  Result Value Ref Range   Cholesterol 140 0 - 200 mg/dL   Triglycerides 134 <150 mg/dL   HDL 36 (L) >40 mg/dL   Total CHOL/HDL Ratio 3.9 RATIO   VLDL 27 0 - 40 mg/dL   LDL Cholesterol 77 0 - 99 mg/dL    Comment:        Total Cholesterol/HDL:CHD Risk Coronary Heart Disease Risk Table                     Men   Women  1/2 Average Risk   3.4   3.3  Average Risk       5.0   4.4  2 X Average Risk   9.6   7.1  3 X Average Risk  23.4   11.0        Use the calculated Patient Ratio above and the CHD Risk Table to determine the patient's CHD Risk.        ATP III CLASSIFICATION (LDL):  <100     mg/dL   Optimal  100-129  mg/dL   Near or Above                    Optimal  130-159  mg/dL   Borderline  160-189  mg/dL   High  >190     mg/dL   Very High Performed at [facility] [address] [place], [place] [address]     Blood Alcohol level:  Lab Results  Component Value Date   [facility] <10 12/03/2021   ETH <10 123XX123    Metabolic Disorder Labs: Lab Results   Component Value Date  HGBA1C 6.3 (H) 12/29/2021   MPG 134.11 12/29/2021   MPG 128 06/28/2021   No results found for: "PROLACTIN" Lab Results  Component Value Date   CHOL 140 12/29/2021   TRIG 134 12/29/2021   HDL 36 (L) 12/29/2021   CHOLHDL 3.9 12/29/2021   VLDL 27 12/29/2021   LDLCALC 77 12/29/2021   LDLCALC 92 07/04/2021    Physical Findings: AIMS: Facial and Oral Movements Muscles of Facial Expression: None, normal Lips and Perioral Area: None, normal Jaw: None, normal Tongue: None, normal,Extremity Movements Upper (arms, wrists, hands, fingers): Mild Lower (legs, knees, ankles, toes): Minimal, Trunk Movements Neck, shoulders, hips: Mild, Overall Severity Severity of abnormal movements (highest score from questions above): Minimal Incapacitation due to abnormal movements: None, normal Patient's awareness of abnormal movements (rate only patient's report): Aware, no distress, Dental Status Current problems with teeth and/or dentures?: No (missing) Does patient usually wear dentures?: No  CIWA:    COWS:     Musculoskeletal: Strength & Muscle Tone: within normal limits Gait & Station: normal Patient leans: N/A  Psychiatric Specialty Exam:  General Appearance: Better hygiene noted, dressed in hospital scrubs, appears older than stated age Involuntary upper extremities and lower extremities movement noted mild in severity.  Behavior: Calm and cooperative  Psychomotor Activity: No agitation or retardation noted  Eye Contact: Fair Speech: Normal amount tone and volume   Mood: Euthymic Affect: Congruent, restricted  Thought Process: Linear, concrete Descriptions of Associations: Intact with much more organized and linear thought process noted Thought Content: Hallucinations: Denies AVH, does not appear responding to stimuli during evaluation Delusions: No paranoia noted Suicidal Thoughts: Denies SI, intention, plan  Homicidal Thoughts: Denies HI, intention,  plan   Alertness/Orientation: Alert, partially oriented  Insight: Improved Judgment: Improved  Memory: Limited  Executive Functions  Concentration: Limited, easily distracted Attention Span: Limited Recall: Limited Fund of Knowledge: Limited  Assets  Assets:Communication Skills; Desire for Improvement; Physical Health    Physical Exam: Physical Exam ROS Blood pressure 139/74, pulse 71, temperature 97.7 F (36.5 C), temperature source Oral, resp. rate 16, height 6' (1.829 m), weight 82.1 kg, SpO2 100 %. Body mass index is 24.55 kg/m.   Treatment Plan Summary:  ASSESSMENT:  Diagnoses / Active Problems: Principal Problem: Schizophrenia (HCC) Diagnosis: Principal Problem:   Schizophrenia (HCC)   PLAN: Safety and Monitoring:             -- Involuntary admission to inpatient psychiatric unit for safety, stabilization and treatment             -- Daily contact with patient to assess and evaluate symptoms and progress in treatment             -- Patient's case to be discussed in multi-disciplinary team meeting             -- Observation Level : q15 minute checks             -- Vital signs:  q12 hours             -- Precautions: suicide, elopement, and assault   2. Medications:             Continue Depakote 500 mg twice daily for mood stabilization and agitation                         Continue Haldol 5 mg in the morning and 10 mg at night to target better control of psychosis.  Start Haldol  decanoate injection to help with long-term compliance if effective and well-tolerated Will give 100 mg IM injection today 8/25 to be followed by 50 mg IM injection in 3 days prior to discharge.             Continue Cogentin 1 mg twice daily for EPS             Continue Neurontin 400 mg 3 times daily for mood and anxiety                        Continue trazodone150 mg at bedtime for sleep             Continue Norvasc 5 mg daily for hypertension, consider titrating up to 10 mg if  needed             Continue vitamin D weekly for vitamin D deficiency             Risperidone, Ativan and Geodon as needed for episodic psychotic agitation               RPR reactive, titer 1:2, Patient denies h/o treatment for syphilis, patient is poor historian, consulted with ID over the phone, chart and case were reviewed, T pallidum Abs negative, no need for treatment at this time, results consistent with false positive result    The risks/benefits/side-effects/alternatives to this medication were discussed in detail with the patient and time was given for questions. The patient consents to medication trial.                -- Metabolic profile and EKG monitoring obtained while on an atypical antipsychotic (BMI: Lipid Panel: HbgA1c: QTc:)                            3. Labs Reviewed: Comprehensive metabolic panel no significant abnormalities noted, CBC no significant abnormalities noted, Depakote level 18 below therapeutic on 8/18 and 59 on 7/30, RPR reactive, T Pallidum Abs negative, titer 1:2 8/15, HIV negative 8/18, UA no significant abnormalities noted, urine drug screen -8/18, EKG 7/31 QTc 423 MRI brain 7/20 no abnormalities noted Lipid panel 8/23 within normal level except for low HDL 36, hemoglobin A1c elevated 6.3     Lab ordered:  Depakote level, LFT and ammonia level on 8/26 to ensure therapeutic level and no toxicity   4. Tobacco Use Disorder             -- Discontinued NicoDerm patch per patient's request, reported increased his craving, offered patient Nicorette gum but patient refuses, will follow.             -- Smoking cessation encouraged   5. Group and Therapy: -- Encouraged patient to participate in unit milieu and in scheduled group therapies                 6. Discharge Planning:              -- Social work and case management to assist with discharge planning and identification of hospital follow-up needs prior to discharge             -- Estimated LOS: 5-7 days              -- Discharge Concerns: Need to establish a safety plan; Medication compliance and effectiveness             -- Discharge Goals: Return home with outpatient referrals  for mental health follow-up including medication management/psychotherapy  At time of discharge recommend outpatient follow-up with primary care provider for prediabetic hemoglobin A1c to monitor and follow.   The patient is agreeable with the medication plan, as above. We will monitor the patient's response to pharmacologic treatment, and adjust medications as necessary. Patient is encouraged to participate in group therapy while admitted to the psychiatric unit. We will address other chronic and acute stressors, which contributed to the patient's worsening psychosis and physical aggression, in order to reduce the risk of self-harm at discharge.     Physician Treatment Plan for Primary Diagnosis: Schizophrenia ([place]) Long Term Goal(s): Improvement in symptoms so as ready for discharge   Short Term Goals: Ability to identify changes in lifestyle to reduce recurrence of condition will improve, Ability to verbalize feelings will improve, Ability to disclose and discuss suicidal ideas, Ability to demonstrate self-control will improve, Ability to identify and develop effective coping behaviors will improve, Ability to maintain clinical measurements within normal limits will improve, and Compliance with prescribed medications will improve     Total Time Spent in Direct Patient Care:  I personally spent 35 minutes on the unit in direct patient care. The direct patient care time included face-to-face time with the patient, reviewing the patient's chart, communicating with other professionals, and coordinating care. Greater than 50% of this time was spent in counseling or coordinating care with the patient regarding goals of hospitalization, psycho-education, and discharge planning needs.   Ouida Abeyta Winfred Leeds, MD 12/31/2021, 10:38 AM

## 2021-12-31 NOTE — BHH Group Notes (Signed)
Spirituality group facilitated by Chaplain Katy Jina Olenick, BCC.   Group Description: Group focused on topic of hope. Patients participated in facilitated discussion around topic, connecting with one another around experiences and definitions for hope. Group members engaged with visual explorer photos, reflecting on what hope looks like for them today. Group engaged in discussion around how their definitions of hope are present today in hospital.   Modalities: Psycho-social ed, Adlerian, Narrative, MI   Patient Progress: Did not attend.  

## 2021-12-31 NOTE — Group Note (Signed)
Recreation Therapy Group Note   Group Topic:Goal Setting  Group Date: 12/31/2021 Start Time: 1000 End Time: 1045 Facilitators: Caroll Rancher, LRT,CTRS Location: [address] Dayroom   Goal Area(s) Addresses:  Patient will listen on 1 prompt. Patient will participate in discussion of what a goal is. Patient will successfully complete their self inventory sheet.  Group Description: LRT and patients discussed the importance of goals and how they impact one's life.  Patients were then given a worksheet where they were to identify goals they wanted to accomplish in the next week, month, year and five years.  Patients would then identify obstacles reaching their goals, what they need to achieve goals and what they can start doing now to work towards those goals.    Affect/Mood: N/A   Participation Level: Did not attend    Clinical Observations/Individualized Feedback:     Plan: Continue to engage patient in RT group sessions 2-3x/week.   Caroll Rancher, LRT,CTRS 12/31/2021 1:17 PM

## 2021-12-31 NOTE — BHH Group Notes (Signed)
Adult Psychoeducational Group Note  Date:  12/31/2021 Time:  8:22 PM  Group Topic/Focus:  Wrap-Up Group:   The focus of this group is to help patients review their daily goal of treatment and discuss progress on daily workbooks.  Participation Level:  Active  Participation Quality:  Appropriate and Attentive  Affect:  Appropriate  Cognitive:  Alert and Appropriate  Insight: Appropriate and Good  Engagement in Group:  Engaged  Modes of Intervention:  Discussion and Education  Additional Comments:  Pt attended and participated in wrap up group this evening and rated their day a 7/10. Pt states that they need to get in contact with their Dad concerning their discharge, but has been unsuccessful. Pt states that they are not sleeping well and complains that they feel that they are taking too many medications.   Chrisandra Netters 12/31/2021, 8:22 PM

## 2021-12-31 NOTE — Progress Notes (Signed)
Pt was encouraged but didn't attend orientation/goals group. ?

## 2021-12-31 NOTE — Group Note (Signed)
LCSW Group Therapy Note  Group Date: 12/31/2021 Start Time: 1300 End Time: 1400   Type of Therapy and Topic:  Group Therapy - How To Cope with Nervousness about Discharge   Participation Level:  Did Not Attend   Description of Group This process group involved identification of patients' feelings about discharge. Some of them are scheduled to be discharged soon, while others are new admissions, but each of them was asked to share thoughts and feelings surrounding discharge from the hospital. One common theme was that they are excited at the prospect of going home, while another was that many of them are apprehensive about sharing why they were hospitalized. Patients were given the opportunity to discuss these feelings with their peers in preparation for discharge.  Therapeutic Goals  Patient will identify their overall feelings about pending discharge. Patient will think about how they might proactively address issues that they believe will once again arise once they get home (i.e. with parents). Patients will participate in discussion about having hope for change.   Summary of Patient Progress:   Did not attend   Therapeutic Modalities Cognitive Behavioral Therapy   Beatris Si, LCSW 12/31/2021  12:04 PM

## 2021-12-31 NOTE — Progress Notes (Signed)
    12/31/21 2058  Psych Admission Type (Psych Patients Only)  Admission Status Involuntary  Psychosocial Assessment  Patient Complaints Insomnia  Eye Contact Brief  Facial Expression Blank  Affect Flat  Speech Soft;Slow  Interaction Minimal  Motor Activity Slow  Appearance/Hygiene In scrubs  Behavior Characteristics Cooperative  Mood Preoccupied;Suspicious  Thought Process  Coherency WDL  Content WDL  Delusions None reported or observed  Perception Hallucinations  Hallucination Auditory  Judgment WDL  Confusion WDL  Danger to Self  Current suicidal ideation? Denies  Danger to Others  Danger to Others None reported or observed

## 2021-12-31 NOTE — Progress Notes (Signed)
   12/31/21 0500  Sleep  Number of Hours 7.5

## 2021-12-31 NOTE — BHH Counselor (Signed)
CSW called police for a wellness check on patient guardian and father, Alric Geise.  CSW has not been able to get in contact with guardian since patient's admission even after several attempts.  Sherrif department agreed that officer would give [place] a call if they are able to locate him and would have guardian to reach out.    Nayana Lenig, LCSW, LCAS Clincal Social Worker  [facility]

## 2021-12-31 NOTE — Progress Notes (Signed)
   12/31/21 1000  Psych Admission Type (Psych Patients Only)  Admission Status Involuntary  Psychosocial Assessment  Patient Complaints Anxiety  Eye Contact Fair  Facial Expression Anxious  Affect Anxious  Speech Soft  Interaction Assertive  Motor Activity Slow  Appearance/Hygiene In scrubs  Behavior Characteristics Cooperative  Mood Preoccupied;Suspicious  Aggressive Behavior  Effect No apparent injury  Thought Process  Coherency WDL  Content WDL  Delusions None reported or observed  Perception Hallucinations  Hallucination Auditory  Judgment WDL  Confusion WDL  Danger to Self  Current suicidal ideation? Denies  Danger to Others  Danger to Others None reported or observed

## 2022-01-01 LAB — HEPATIC FUNCTION PANEL
ALT: 10 U/L (ref 0–44)
AST: 15 U/L (ref 15–41)
Albumin: 3.5 g/dL (ref 3.5–5.0)
Alkaline Phosphatase: 55 U/L (ref 38–126)
Bilirubin, Direct: <0.1 mg/dL (ref 0.0–0.2)
Total Bilirubin: 0.4 mg/dL (ref 0.3–1.2)
Total Protein: 7.3 g/dL (ref 6.5–8.1)

## 2022-01-01 LAB — AMMONIA: Ammonia: 35 umol/L (ref 9–35)

## 2022-01-01 LAB — VALPROIC ACID LEVEL: Valproic Acid Lvl: 42 ug/mL — ABNORMAL LOW (ref 50.0–100.0)

## 2022-01-01 NOTE — Progress Notes (Signed)
[facility] MD Progress Note  01/01/2022 10:32 AM Mark [name]  MRN:  [id number]   Reason for Admission:  Mark [name] is a 61 y.o., male with a past psychiatric history significant for schizophrenia who presents to the [facility] from [facility] emergency room for evaluation and management of increased psychosis and agitation. The patient is currently on Hospital Day 5.   Chart Review from last 24 hours:  The patient's chart was reviewed and nursing notes were reviewed. The patient's case was discussed in multidisciplinary team meeting. Per [facility] patient is compliant with his scheduled medications in the hospital including Cogentin and Depakote Neurontin Haldol trazodone Inderal and Norvasc.  He did not need and was not given any as needed for agitation or aggression and did not require any as needed Ativan for anxiety or agitation.  Per evaluation and staff report patient had 1 incident this morning of laughing to himself and giggling loudly in front of the med room, reportedly this was not noted yesterday at all but this is the only incident noted this morning, will follow.  Information Obtained Today During Patient Interview: The patient was seen and evaluated on the unit. On assessment today the patient continues to present linear with organized thought process, no disorganized thought process noted any further continues to deny SI HI or AVH but after the interview he was noted to be talking to himself laughing and giggling loudly in front of the med room, will follow.  Prior to suggest incident he was not noted to be responding to stimuli for at least 24 hours.  During evaluation he denies side effect of medication and does not demonstrate any sign consistent with EPS.  He does not present sleepy or sedated, reports good sleep and appetite.  He continues to be participating in milieu activities including going to the gym and dining room activities and also attending groups with some  participation noted.   He received first injection of Haldol dec yesterday and will receive second injection by Monday or Tuesday.  Addendum: Spoke to patient's father over the phone at [phone], discussed circumstances and reason for admission including noncompliance with medication at home and agitation and aggression towards family members at home, discussed with patient's father patient was irritable and agitated as well as disorganized at time of admission but after started on medications including Haldol, Depakote and trazodone at bedtime he gradually is improving, getting more cooperative with no agitation noted during hospital stay and improving in regard to disorganized thought process and psychosis.  Discussed with patient's father starting Haldol decanoate to help with long-term compliance with Haldol but patient will continue to need to take Haldol p.o. for 2 weeks after discharge and also will have to continue taking Depakote by mouth, also discussed will refer to peers support after discharge to help ensuring patient has supplies of medications as well as able to make his follow-up appointments.  Discussed with patient's father if patient continues to improve and tolerates injections well plan to discharge sometime next week, father sounded understanding.  Sleep  Sleep: good, improved per patient's report  Principal Problem: Schizophrenia (HCC) Diagnosis: Principal Problem:   Schizophrenia (HCC)    Past Psychiatric History: Prior Psychiatric diagnoses: Schizophrenia Past Psychiatric Hospitalizations: At least twice last time February 2023 at this facility   History of self mutilation: Denies Past suicide attempts: Denies Past history of HI, violent or aggressive behavior: History of aggression towards family members as well as history of assault  charges 3 weeks ago and patient reports was in prison for 2 years for armed robbery and physical assault in the past   Past Psychiatric  medications trials: Depakote, clozapine, Haldol, Cogentin, gabapentin, trazodone, Vistaril with varying efficacy History of ECT/TMS: Denies   Outpatient psychiatric Follow up: Unknown but reported lack of compliance Prior Outpatient Therapy: Unknown  Past Medical History:  Past Medical History:  Diagnosis Date   COPD (chronic obstructive pulmonary disease) ([place])    Hypertension    Schizophrenia ([place])     Past Surgical History:  Procedure Laterality Date   LOWER EXTREMITY ANGIOGRAPHY N/A 09/18/2017   Procedure: LOWER EXTREMITY ANGIOGRAPHY- Recheck lysis;  Surgeon: Waynetta Sandy, MD;  Location: [place] [facility];  Service: Cardiovascular;  Laterality: N/A;   PERIPHERAL VASCULAR INTERVENTION Left 09/18/2017   Procedure: PERIPHERAL VASCULAR INTERVENTION;  Surgeon: Waynetta Sandy, MD;  Location: [place] [facility];  Service: Cardiovascular;  Laterality: Left;   THROMBECTOMY FEMORAL ARTERY Left 09/17/2017   Procedure: POSSIBLE THROMBECTOMY;  Surgeon: Waynetta Sandy, MD;  Location: [place];  Service: Vascular;  Laterality: Left;   VENOGRAM Left 09/17/2017   Procedure: ULTRASOUND POPLITEAL ACCESS; CENTRAL VENOGRAM, IVVS, LYSIS CATHETER PLACEMENT;  Surgeon: Waynetta Sandy, MD;  Location: [place];  Service: Vascular;  Laterality: Left;   Family History: History reviewed. No pertinent family history. Family Psychiatric  History: Denies any Social History: Living situation: Lives with his father, brother and cousin Social support: Limited Marital Status: Never married Children: Patient reports he has 7 children 4 boys and 3 girls live with her mother in [place], all from same mother per patient's report Education: 9 grade Employment: Unemployed, reports on disability but unable to specify since when [employer] service: Denies Legal history: Denies pending charges or court dates but reports 3 weeks ago was in jail for physical assault but no pending court  dates, reports was in prison in the past for 2 years for armed robbery and assault on Engineer, structural. Trauma: Denies Access to guns: Denies  Current Medications: Current Facility-Administered Medications  Medication Dose Route Frequency Provider Last Rate Last Admin   acetaminophen (TYLENOL) tablet 650 mg  650 mg Oral Q4H PRN Vesta Mixer, NP   650 mg at 12/30/21 2055   alum & mag hydroxide-simeth (MAALOX/MYLANTA) 200-200-20 MG/5ML suspension 30 mL  30 mL Oral Q6H PRN Vesta Mixer, NP       amLODipine (NORVASC) tablet 5 mg  5 mg Oral Daily Vesta Mixer, NP   5 mg at 01/01/22 0751   benztropine (COGENTIN) tablet 1 mg  1 mg Oral BID Vesta Mixer, NP   1 mg at 01/01/22 0751   divalproex (DEPAKOTE ER) 24 hr tablet 500 mg  500 mg Oral BID Vesta Mixer, NP   500 mg at 01/01/22 0751   gabapentin (NEURONTIN) capsule 400 mg  400 mg Oral TID Vesta Mixer, NP   400 mg at 01/01/22 0751   haloperidol (HALDOL) tablet 10 mg  10 mg Oral QHS Christain Mcraney, MD   10 mg at 12/31/21 2058   haloperidol (HALDOL) tablet 5 mg  5 mg Oral Daily Catelyn Friel, MD   5 mg at 01/01/22 0751   risperiDONE (RISPERDAL M-TABS) disintegrating tablet 2 mg  2 mg Oral Q8H PRN Vesta Mixer, NP       And   LORazepam (ATIVAN) tablet 1 mg  1 mg Oral PRN Vesta Mixer, NP       And   ziprasidone (GEODON) injection 20  mg  20 mg Intramuscular PRN Vesta Mixer, NP       magnesium hydroxide (MILK OF MAGNESIA) suspension 30 mL  30 mL Oral Daily PRN Vesta Mixer, NP       ondansetron (ZOFRAN) tablet 4 mg  4 mg Oral Q8H PRN Vesta Mixer, NP       traZODone (DESYREL) tablet 150 mg  150 mg Oral QHS Winfred Leeds, Clayvon Parlett, MD   150 mg at 12/31/21 2058   [START ON 01/02/2022] Vitamin D (Ergocalciferol) (DRISDOL) 1.25 MG (50000 UNIT) capsule 50,000 Units  50,000 Units Oral Weekly Vesta Mixer, NP        Lab Results:  Results for orders placed or performed during the hospital encounter of 12/27/21 (from the  past 48 hour(s))  Valproic acid level     Status: Abnormal   Collection Time: 01/01/22  6:42 AM  Result Value Ref Range   Valproic Acid Lvl 42 (L) 50.0 - 100.0 ug/mL    Comment: Performed at [facility], [place] [address] [place], [place] [address]  Hepatic function panel     Status: None   Collection Time: 01/01/22  6:42 AM  Result Value Ref Range   Total Protein 7.3 6.5 - 8.1 g/dL   Albumin 3.5 3.5 - 5.0 g/dL   AST 15 15 - 41 U/L   ALT 10 0 - 44 U/L   Alkaline Phosphatase 55 38 - 126 U/L   Total Bilirubin 0.4 0.3 - 1.2 mg/dL   Bilirubin, Direct <0.1 0.0 - 0.2 mg/dL   Indirect Bilirubin NOT CALCULATED 0.3 - 0.9 mg/dL    Comment: Performed at [facility], [place] [address] [place], [place] [address]  Ammonia     Status: None   Collection Time: 01/01/22  6:42 AM  Result Value Ref Range   Ammonia 35 9 - 35 umol/L    Comment: Performed at [facility], [place] [address], [place], [place] [address]    Blood Alcohol level:  Lab Results  Component Value Date   ETH <10 12/03/2021   ETH <10 123XX123    Metabolic Disorder Labs: Lab Results  Component Value Date   HGBA1C 6.3 (H) 12/29/2021   MPG 134.11 12/29/2021   MPG 128 06/28/2021   No results found for: "PROLACTIN" Lab Results  Component Value Date   CHOL 140 12/29/2021   TRIG 134 12/29/2021   HDL 36 (L) 12/29/2021   CHOLHDL 3.9 12/29/2021   VLDL 27 12/29/2021   LDLCALC 77 12/29/2021   LDLCALC 92 07/04/2021    Physical Findings: AIMS: Facial and Oral Movements Muscles of Facial Expression: None, normal Lips and Perioral Area: None, normal Jaw: None, normal Tongue: None, normal,Extremity Movements Upper (arms, wrists, hands, fingers): Mild Lower (legs, knees, ankles, toes): Minimal, Trunk Movements Neck, shoulders, hips: Mild, Overall Severity Severity of abnormal movements (highest score from questions above): Minimal Incapacitation due to abnormal movements:  None, normal Patient's awareness of abnormal movements (rate only patient's report): Aware, no distress, Dental Status Current problems with teeth and/or dentures?: No (missing) Does patient usually wear dentures?: No  CIWA:    COWS:     Musculoskeletal: Strength & Muscle Tone: within normal limits Gait & Station: normal Patient leans: N/A  Psychiatric Specialty Exam:  General Appearance: Better hygiene noted, dressed in hospital scrubs, appears older than stated age Involuntary upper extremities and lower extremities movement noted mild in severity.  Behavior: Calm and cooperative  Psychomotor Activity: No agitation or retardation noted  Eye Contact: Fair Speech: Normal amount tone and volume   Mood: Euthymic Affect: Congruent, restricted  Thought Process: Linear, concrete Descriptions of Associations: Intact with much more organized and linear thought process noted Thought Content: Hallucinations: Denies AVH, does not appear responding to stimuli during evaluation Delusions: No paranoia noted Suicidal Thoughts: Denies SI, intention, plan  Homicidal Thoughts: Denies HI, intention, plan   Alertness/Orientation: Alert, partially oriented  Insight: Improved Judgment: Improved  Memory: Limited  Executive Functions  Concentration: Limited, easily distracted Attention Span: Limited Recall: Limited Fund of Knowledge: Limited  Assets  Assets:Communication Skills; Desire for Improvement; Physical Health    Physical Exam: Physical Exam ROS Blood pressure (!) 146/70, pulse 88, temperature 97.9 F (36.6 C), temperature source Oral, resp. rate 16, height 6' (1.829 m), weight 82.1 kg, SpO2 99 %. Body mass index is 24.55 kg/m.   Treatment Plan Summary:  ASSESSMENT:  Diagnoses / Active Problems: Principal Problem: Schizophrenia (HCC) Diagnosis: Principal Problem:   Schizophrenia (HCC)   PLAN: Safety and Monitoring:             -- Involuntary admission to  inpatient psychiatric unit for safety, stabilization and treatment             -- Daily contact with patient to assess and evaluate symptoms and progress in treatment             -- Patient's case to be discussed in multi-disciplinary team meeting             -- Observation Level : q15 minute checks             -- Vital signs:  q12 hours             -- Precautions: suicide, elopement, and assault   2. Medications:             Continue Depakote 500 mg twice daily for mood stabilization and agitation                         Continue Haldol 5 mg in the morning and 10 mg at night to target better control of psychosis.  Monitor, if continues to have episodes of responding to stimuli will titrate Haldol to 10 mg twice daily. Continue Haldol decanoate injection to help with long-term compliance if effective and well-tolerated Haldol decanoate 100 mg IM injection today 8/25 to be followed by 50 or 100 mg IM injection in 3 days prior to discharge.             Continue Cogentin 1 mg twice daily for EPS             Continue Neurontin 400 mg 3 times daily for mood and anxiety                        Continue trazodone150 mg at bedtime for sleep             Continue Norvasc 5 mg daily for hypertension, consider titrating up to 10 mg if needed             Continue vitamin D weekly for vitamin D deficiency             Risperidone, Ativan and Geodon as needed for episodic psychotic agitation               RPR reactive, titer 1:2, Patient denies h/o treatment for syphilis, patient is poor historian, consulted with  ID over the phone, chart and case were reviewed, T pallidum Abs negative, no need for treatment at this time, results consistent with false positive result    The risks/benefits/side-effects/alternatives to this medication were discussed in detail with the patient and time was given for questions. The patient consents to medication trial.                -- Metabolic profile and EKG monitoring  obtained while on an atypical antipsychotic (BMI: Lipid Panel: HbgA1c: QTc:)                            3. Labs Reviewed: Comprehensive metabolic panel no significant abnormalities noted, CBC no significant abnormalities noted, Depakote level 18 below therapeutic on 8/18 and 59 on 7/30, RPR reactive, T Pallidum Abs negative, titer 1:2 8/15, HIV negative 8/18, UA no significant abnormalities noted, urine drug screen -8/18, EKG 7/31 QTc 423 MRI brain 7/20 no abnormalities noted Lipid panel 8/23 within normal level except for low HDL 36, hemoglobin A1c elevated 6.3 Depakote level 12/2640 low therapeutic, LFT pending, ammonia level pending     Lab ordered:  LFT and ammonia level on 8/26 to ensure therapeutic level and no toxicity   4. Tobacco Use Disorder             -- Discontinued NicoDerm patch per patient's request, reported increased his craving, offered patient Nicorette gum but patient refuses, will follow.             -- Smoking cessation encouraged   5. Group and Therapy: -- Encouraged patient to participate in unit milieu and in scheduled group therapies                 6. Discharge Planning:              -- Social work and case management to assist with discharge planning and identification of hospital follow-up needs prior to discharge             -- Estimated LOS: 5-7 days             -- Discharge Concerns: Need to establish a safety plan; Medication compliance and effectiveness             -- Discharge Goals: Return home with outpatient referrals for mental health follow-up including medication management/psychotherapy  At time of discharge recommend outpatient follow-up with primary care provider for prediabetic hemoglobin A1c to monitor and follow.   The patient is agreeable with the medication plan, as above. We will monitor the patient's response to pharmacologic treatment, and adjust medications as necessary. Patient is encouraged to participate in group therapy while  admitted to the psychiatric unit. We will address other chronic and acute stressors, which contributed to the patient's worsening psychosis and physical aggression, in order to reduce the risk of self-harm at discharge.     Physician Treatment Plan for Primary Diagnosis: Schizophrenia (HCC) Long Term Goal(s): Improvement in symptoms so as ready for discharge   Short Term Goals: Ability to identify changes in lifestyle to reduce recurrence of condition will improve, Ability to verbalize feelings will improve, Ability to disclose and discuss suicidal ideas, Ability to demonstrate self-control will improve, Ability to identify and develop effective coping behaviors will improve, Ability to maintain clinical measurements within normal limits will improve, and Compliance with prescribed medications will improve     Total Time Spent in Direct Patient Care:  I personally spent  35 minutes on the unit in direct patient care. The direct patient care time included face-to-face time with the patient, reviewing the patient's chart, communicating with other professionals, and coordinating care. Greater than 50% of this time was spent in counseling or coordinating care with the patient regarding goals of hospitalization, psycho-education, and discharge planning needs.   Akeen Ledyard Winfred Leeds, MD 01/01/2022, 10:32 AM

## 2022-01-01 NOTE — [facility] Group Notes (Signed)
Goals Group 01/01/2022   Group Focus: affirmation, clarity of thought, and goals/reality orientation Treatment Modality:  Psychoeducation Interventions utilized were assignment, group exercise, and support Purpose: To be able to understand and verbalize the reason for their admission to the hospital. To understand that the medication helps with their chemical imbalance but they also need to work on their choices in life. To be challenged to develop a list of 30 positives about themselves. Also introduce the concept that "feelings" are not reality.  Participation Level:  Active  Participation Quality:  Appropriate  Affect:  Appropriate  Cognitive:  laughing to self and talking out loud. Denies auditory hallucinations   Insight:  Improving  Engagement in Group:  Engaged  Additional Comments:  .Pt rates his energy at an 8/10. Would sit and talk to himself and begin to laugh. Hands shaking. Asked for help writing out his list of positives about himself. Sharee Pimple, Delorse Limber

## 2022-01-01 NOTE — Progress Notes (Signed)
   01/01/22 1300  Psych Admission Type (Psych Patients Only)  Admission Status Involuntary  Psychosocial Assessment  Patient Complaints Anxiety  Eye Contact Brief  Facial Expression Blank  Affect Flat  Speech Soft  Interaction Minimal  Motor Activity Slow  Appearance/Hygiene In scrubs  Behavior Characteristics Cooperative  Mood Suspicious;Preoccupied  Aggressive Behavior  Effect No apparent injury  Thought Process  Coherency WDL  Content WDL  Delusions None reported or observed  Perception Hallucinations  Hallucination Auditory  Judgment WDL  Confusion WDL  Danger to Self  Current suicidal ideation? Denies  Danger to Others  Danger to Others None reported or observed

## 2022-01-01 NOTE — Progress Notes (Signed)
Adult Psychoeducational Group Note  Date:  01/01/2022 Time:  8:44 PM  Group Topic/Focus:  Wrap-Up Group:   The focus of this group is to help patients review their daily goal of treatment and discuss progress on daily workbooks.  Participation Level:  Active  Participation Quality:  Attentive  Affect:  Appropriate  Cognitive:  Appropriate  Insight: Appropriate  Engagement in Group:  Engaged  Modes of Intervention:  Discussion  Additional Comments:   Pt states that he had a good day. Pt states that he attended groups and has been feeling like people can see his thoughts. Pt endorsed AVH and was very fidgety during group discussion.  Vevelyn Pat 01/01/2022, 8:44 PM

## 2022-01-01 NOTE — Progress Notes (Signed)
Pt has been isolative to his room much of the night.  He did come out for wrap up group and snack.  He denied SI/HI or AVH however he was heard talking to himself in his room.  No interaction with peers.  He complained of lower back pain and tylenol was given with good relief.  Q 15 minute checks maintained for safety.  He remains safe on the unit.   01/01/22 1940  Psych Admission Type (Psych Patients Only)  Admission Status Involuntary  Psychosocial Assessment  Patient Complaints Insomnia  Eye Contact Brief  Facial Expression Blank  Affect Flat  Speech Slow;Soft;Slurred  Interaction Minimal;No initiation  Motor Activity Slow  Appearance/Hygiene In scrubs;Disheveled  Behavior Characteristics Cooperative  Mood Suspicious;Preoccupied  Thought Process  Coherency WDL  Content WDL  Delusions None reported or observed  Perception Hallucinations  Hallucination Auditory  Judgment WDL  Confusion WDL  Danger to Self  Current suicidal ideation? Denies  Danger to Others  Danger to Others None reported or observed

## 2022-01-01 NOTE — Group Note (Signed)
[facility] LCSW Group Therapy Note   Type of Therapy and Topic:  Group Therapy:  Healthy vs Unhealthy Coping Skills  Participation Level:  None   Description of Group:  The focus of this group was to determine what unhealthy and healthy coping techniques typically are used by group members and why they tend to fall back on those particular techniques of handling hard situations. Patients were guided in becoming aware of the differences between healthy and unhealthy coping techniques.  Facilitator led a discussion about the benefits and costs of returning to old unhealthy coping techniques, as well as the benefits and costs of learning new healthier coping skills.  Therapeutic Goals Patients learned that coping is what human beings do all day long to deal with various situations in their lives Patients defined and discussed healthy vs unhealthy coping techniques Patients came to understand the the reasons human beings often return to old coping techniques that they know are unhelpful Patients were provided with motivation to consider new means of coping that may be more healthy and helpful Patients provided support and ideas to each other  Summary of Patient Progress: The patient was invited to group, did not arrive to participate until the last 5 minutes of group.   Therapeutic Modalities Psychoeducation Motivational Interviewing   Ambrose Mantle, LCSW 01/01/2022, 11:41 AM

## 2022-01-02 DIAGNOSIS — G2401 Drug induced subacute dyskinesia: Secondary | ICD-10-CM

## 2022-01-02 MED ORDER — VALBENAZINE TOSYLATE 40 MG PO CAPS
40.0000 mg | ORAL_CAPSULE | Freq: Every day | ORAL | Status: DC
  Administered 2022-01-02 – 2022-01-05 (×4): 40 mg via ORAL
  Filled 2022-01-02 (×8): qty 1

## 2022-01-02 MED ORDER — HALOPERIDOL 5 MG PO TABS
10.0000 mg | ORAL_TABLET | Freq: Two times a day (BID) | ORAL | Status: DC
  Administered 2022-01-02 – 2022-01-05 (×6): 10 mg via ORAL
  Filled 2022-01-02 (×11): qty 2

## 2022-01-02 MED ORDER — LORAZEPAM 1 MG PO TABS
1.0000 mg | ORAL_TABLET | Freq: Four times a day (QID) | ORAL | Status: DC | PRN
  Administered 2022-01-02 – 2022-01-05 (×2): 1 mg via ORAL
  Filled 2022-01-02: qty 1

## 2022-01-02 NOTE — Progress Notes (Signed)
D:  Patient's self inventory sheet, patient sleeps good, sleep medication helpful.  Fair appetite, normal energy level, good concentration.  Rated depression 10, hopeless and anxiety 7.  Denied withdrawals.  Denied SI.  Denied physical pain.  Does have discharge plans. A:  Medications administered per MD orders.  Emotional support and encouragement given patient. R:  Denied SI and HI, contracts for safety.  Denied A/V hallucinations.  Have been monitoring patient's BP throughout the day

## 2022-01-02 NOTE — Progress Notes (Signed)
   01/02/22 2200  Psych Admission Type (Psych Patients Only)  Admission Status Involuntary  Psychosocial Assessment  Patient Complaints None  Eye Contact Brief  Facial Expression Flat  Affect Appropriate to circumstance  Speech Soft  Interaction Isolative  Motor Activity Tremors;Slow  Appearance/Hygiene Disheveled;Poor hygiene  Behavior Characteristics Appropriate to situation;Guarded  Mood Pleasant  Thought Process  Coherency WDL  Content Blaming others  Delusions None reported or observed  Perception WDL  Hallucination None reported or observed  Judgment WDL  Confusion None  Danger to Self  Current suicidal ideation? Denies

## 2022-01-02 NOTE — Plan of Care (Signed)
Nurse discussed anxiety, depression and coping skills throughout the day.

## 2022-01-02 NOTE — Progress Notes (Signed)
Adult Psychoeducational Group Note  Date:  01/02/2022 Time:  8:53 PM  Group Topic/Focus:  Wrap-Up Group:   The focus of this group is to help patients review their daily goal of treatment and discuss progress on daily workbooks.  Participation Level:  Active  Participation Quality:  Appropriate  Affect:  Appropriate  Cognitive:  Appropriate  Insight: Appropriate  Engagement in Group:  Improving  Modes of Intervention:  Discussion  Additional Comments:  Pt stated his goal for today was to focus on his treatment plan. Pt stated he accomplished his goal today. Pt stated he talked with his doctor but did not get a chance to speak with his social worker about his care today. Pt rated his overall day a 8 out of 10. Pt stated he made no calls today. Pt stated he felt better about himself today. Pt stated he was able to attend all meals. Pt stated he took all medications provided today. Pt stated he attend all groups held today. Pt stated his appetite was pretty good today. Pt rated sleep last night was fair. Pt stated the goal tonight was to get some rest. Pt stated he had no physical pain tonight. Pt deny visual hallucinations and auditory issues tonight. Pt denies thoughts of harming himself or others. Pt stated he would alert staff if anything changed  Felipa Furnace 01/02/2022, 8:53 PM

## 2022-01-02 NOTE — Group Note (Signed)
[facility] LCSW Group Therapy Note  Date/Time:  01/02/2022  11:00AM-12:00PM  Type of Therapy and Topic:  Group Therapy:  Music and Mood  Participation Level:  Did Not Attend   Description of Group: In this process group, members listened to a variety of genres of music and identified that different types of music evoke different responses.  Patients were encouraged to identify music that was soothing for them and music that was energizing for them.  Patients discussed how this knowledge can help with wellness and recovery in various ways including managing depression and anxiety as well as encouraging healthy sleep habits.    Therapeutic Goals: Patients will explore the impact of different varieties of music on mood Patients will verbalize the thoughts they have when listening to different types of music Patients will identify music that is soothing to them as well as music that is energizing to them Patients will discuss how to use this knowledge to assist in maintaining wellness and recovery Patients will explore the use of music as a coping skill  Summary of Patient Progress:  Patient was invited to group, did not attend.   Therapeutic Modalities: Solution Focused Brief Therapy Activity   Ambrose Mantle, LCSW

## 2022-01-02 NOTE — [facility] Group Notes (Signed)
Adult Psychoeducational Group Note Date:  12/26/2021 Time:  0900-1045 Group Topic/Focus: PROGRESSIVE RELAXATION. A group where deep breathing is taught and tensing and relaxation muscle groups is used. Imagery is used as well.  Pts are asked to imagine 3 pillars that hold them up when they are not able to hold themselves up and to share that with the group.  Participation Level:  did not attend  Merritt Mccravy A   

## 2022-01-02 NOTE — Progress Notes (Signed)
[facility] MD Progress Note  01/02/2022 11:45 AM Mark [name]  MRN:  [id number]   Reason for Admission:  Mark [name] is a 61 y.o., male with a past psychiatric history significant for schizophrenia who presents to the [facility] from [facility] emergency room for evaluation and management of increased psychosis and agitation. The patient is currently on Hospital Day 6.   Chart Review from last 24 hours:  The patient's chart was reviewed and nursing notes were reviewed. The patient's case was discussed in multidisciplinary team meeting. Per [facility] patient is compliant with his scheduled medications in the hospital including Cogentin and Depakote Neurontin Haldol trazodone Inderal and Norvasc.  He did not need and was not given any as needed for agitation or aggression and did not require any as needed Ativan for anxiety or agitation.    Information Obtained Today During Patient Interview: The patient was seen and evaluated on the unit. On assessment today the patient denies feeling depressed or sad, denies SI or HI but he continues to report hearing "voices in my head" "they are real" he tells me that people can read his thoughts denies VH or paranoia questionable episodes of responding to stimuli and he was noted during evaluation today to have more disorganized thought process on and off.  He also reports poor sleep last night interrupted.  He denies side effect to medications but continues to present with upper extremities shake moderate, lower extremity shakes mild consistent with tardive dyskinesia.  AIMS score 13 on 8/27  Addendum: Spoke to patient's father over the phone at [phone], discussed circumstances and reason for admission including noncompliance with medication at home and agitation and aggression towards family members at home, discussed with patient's father patient was irritable and agitated as well as disorganized at time of admission but after started on  medications including Haldol, Depakote and trazodone at bedtime he gradually is improving, getting more cooperative with no agitation noted during hospital stay and improving in regard to disorganized thought process and psychosis.  Discussed with patient's father starting Haldol decanoate to help with long-term compliance with Haldol but patient will continue to need to take Haldol p.o. for 2 weeks after discharge and also will have to continue taking Depakote by mouth, also discussed will refer to peers support after discharge to help ensuring patient has supplies of medications as well as able to make his follow-up appointments.  Discussed with patient's father if patient continues to improve and tolerates injections well plan to discharge sometime next week, father sounded understanding.  Sleep  Sleep: Interrupted  Principal Problem: Schizophrenia (HCC) Diagnosis: Principal Problem:   Schizophrenia (HCC) Active Problems:   Tardive dyskinesia    Past Psychiatric History: Prior Psychiatric diagnoses: Schizophrenia Past Psychiatric Hospitalizations: At least twice last time February 2023 at this facility   History of self mutilation: Denies Past suicide attempts: Denies Past history of HI, violent or aggressive behavior: History of aggression towards family members as well as history of assault charges 3 weeks ago and patient reports was in prison for 2 years for armed robbery and physical assault in the past   Past Psychiatric medications trials: Depakote, clozapine, Haldol, Cogentin, gabapentin, trazodone, Vistaril with varying efficacy History of ECT/TMS: Denies   Outpatient psychiatric Follow up: Unknown but reported lack of compliance Prior Outpatient Therapy: Unknown  Past Medical History:  Past Medical History:  Diagnosis Date   COPD (chronic obstructive pulmonary disease) (HCC)    Hypertension    Schizophrenia (  [place])     Past Surgical History:  Procedure Laterality Date    LOWER EXTREMITY ANGIOGRAPHY N/A 09/18/2017   Procedure: LOWER EXTREMITY ANGIOGRAPHY- Recheck lysis;  Surgeon: Waynetta Sandy, MD;  Location: [place] [facility];  Service: Cardiovascular;  Laterality: N/A;   PERIPHERAL VASCULAR INTERVENTION Left 09/18/2017   Procedure: PERIPHERAL VASCULAR INTERVENTION;  Surgeon: Waynetta Sandy, MD;  Location: [place] [facility];  Service: Cardiovascular;  Laterality: Left;   THROMBECTOMY FEMORAL ARTERY Left 09/17/2017   Procedure: POSSIBLE THROMBECTOMY;  Surgeon: Waynetta Sandy, MD;  Location: [place];  Service: Vascular;  Laterality: Left;   VENOGRAM Left 09/17/2017   Procedure: ULTRASOUND POPLITEAL ACCESS; CENTRAL VENOGRAM, IVVS, LYSIS CATHETER PLACEMENT;  Surgeon: Waynetta Sandy, MD;  Location: [place];  Service: Vascular;  Laterality: Left;   Family History: History reviewed. No pertinent family history. Family Psychiatric  History: Denies any Social History: Living situation: Lives with his father, brother and cousin Social support: Limited Marital Status: Never married Children: Patient reports he has 7 children 4 boys and 3 girls live with her mother in [place], all from same mother per patient's report Education: 9 grade Employment: Unemployed, reports on disability but unable to specify since when [employer] service: Denies Legal history: Denies pending charges or court dates but reports 3 weeks ago was in jail for physical assault but no pending court dates, reports was in prison in the past for 2 years for armed robbery and assault on Engineer, structural. Trauma: Denies Access to guns: Denies  Current Medications: Current Facility-Administered Medications  Medication Dose Route Frequency Provider Last Rate Last Admin   acetaminophen (TYLENOL) tablet 650 mg  650 mg Oral Q4H PRN Vesta Mixer, NP   650 mg at 01/01/22 2106   alum & mag hydroxide-simeth (MAALOX/MYLANTA) 200-200-20 MG/5ML suspension 30 mL  30 mL  Oral Q6H PRN Vesta Mixer, NP       amLODipine (NORVASC) tablet 5 mg  5 mg Oral Daily Vesta Mixer, NP   5 mg at 01/02/22 0845   benztropine (COGENTIN) tablet 1 mg  1 mg Oral BID Vesta Mixer, NP   1 mg at 01/02/22 0846   divalproex (DEPAKOTE ER) 24 hr tablet 500 mg  500 mg Oral BID Vesta Mixer, NP   500 mg at 01/02/22 0845   gabapentin (NEURONTIN) capsule 400 mg  400 mg Oral TID Vesta Mixer, NP   400 mg at 01/02/22 0846   haloperidol (HALDOL) tablet 10 mg  10 mg Oral QHS Winfred Leeds, Taylen Wendland, MD   10 mg at 01/01/22 2104   haloperidol (HALDOL) tablet 5 mg  5 mg Oral Daily Winfred Leeds, Beaumont Austad, MD   5 mg at 01/02/22 0845   risperiDONE (RISPERDAL M-TABS) disintegrating tablet 2 mg  2 mg Oral Q8H PRN Vesta Mixer, NP       And   LORazepam (ATIVAN) tablet 1 mg  1 mg Oral PRN Vesta Mixer, NP       And   ziprasidone (GEODON) injection 20 mg  20 mg Intramuscular PRN Vesta Mixer, NP       magnesium hydroxide (MILK OF MAGNESIA) suspension 30 mL  30 mL Oral Daily PRN Vesta Mixer, NP       ondansetron (ZOFRAN) tablet 4 mg  4 mg Oral Q8H PRN Vesta Mixer, NP       traZODone (DESYREL) tablet 150 mg  150 mg Oral QHS Winfred Leeds, Amarachi Kotz, MD   150 mg at 01/01/22 2104   Vitamin D (Ergocalciferol) (DRISDOL) 1.25  MG (50000 UNIT) capsule 50,000 Units  50,000 Units Oral Weekly Vesta Mixer, NP   50,000 Units at 01/02/22 [id number]    Lab Results:  Results for orders placed or performed during the hospital encounter of 12/27/21 (from the past 48 hour(s))  Valproic acid level     Status: Abnormal   Collection Time: 01/01/22  6:42 AM  Result Value Ref Range   Valproic Acid Lvl 42 (L) 50.0 - 100.0 ug/mL    Comment: Performed at [facility], [place] [address], [place], [place] [address]  Hepatic function panel     Status: None   Collection Time: 01/01/22  6:42 AM  Result Value Ref Range   Total Protein 7.3 6.5 - 8.1 g/dL   Albumin 3.5 3.5 - 5.0 g/dL   AST 15 15 - 41 U/L    ALT 10 0 - 44 U/L   Alkaline Phosphatase 55 38 - 126 U/L   Total Bilirubin 0.4 0.3 - 1.2 mg/dL   Bilirubin, Direct <0.1 0.0 - 0.2 mg/dL   Indirect Bilirubin NOT CALCULATED 0.3 - 0.9 mg/dL    Comment: Performed at [facility], [place] [address], [place], [place] [address]  Ammonia     Status: None   Collection Time: 01/01/22  6:42 AM  Result Value Ref Range   Ammonia 35 9 - 35 umol/L    Comment: Performed at [facility], [place] [address] [place], [place] [address]    Blood Alcohol level:  Lab Results  Component Value Date   ETH <10 12/03/2021   ETH <10 123XX123    Metabolic Disorder Labs: Lab Results  Component Value Date   HGBA1C 6.3 (H) 12/29/2021   MPG 134.11 12/29/2021   MPG 128 06/28/2021   No results found for: "PROLACTIN" Lab Results  Component Value Date   CHOL 140 12/29/2021   TRIG 134 12/29/2021   HDL 36 (L) 12/29/2021   CHOLHDL 3.9 12/29/2021   VLDL 27 12/29/2021   LDLCALC 77 12/29/2021   LDLCALC 92 07/04/2021    Physical Findings: AIMS: Facial and Oral Movements Muscles of Facial Expression: None, normal Lips and Perioral Area: None, normal Jaw: None, normal Tongue: None, normal,Extremity Movements Upper (arms, wrists, hands, fingers): Moderate Lower (legs, knees, ankles, toes): Mild, Trunk Movements Neck, shoulders, hips: Minimal, Overall Severity Severity of abnormal movements (highest score from questions above): Moderate Incapacitation due to abnormal movements: Mild Patient's awareness of abnormal movements (rate only patient's report): Aware, mild distress, Dental Status Current problems with teeth and/or dentures?: No Does patient usually wear dentures?: No  CIWA:    COWS:     Musculoskeletal: Strength & Muscle Tone: within normal limits Gait & Station: normal Patient leans: N/A  Psychiatric Specialty Exam:  General Appearance: Better hygiene noted, dressed in hospital scrubs, appears older than  stated age Involuntary upper extremities and lower extremities movement noted mild in severity.  Behavior: Calm and cooperative  Psychomotor Activity: No agitation or retardation noted  Eye Contact: Fair Speech: Normal amount tone and volume   Mood: Euthymic Affect: Congruent, restricted  Thought Process: Linear, concrete Descriptions of Associations: Intact with much more organized and linear thought process noted Thought Content: Hallucinations: Denies AVH, does not appear responding to stimuli during evaluation Delusions: No paranoia noted Suicidal Thoughts: Denies SI, intention, plan  Homicidal Thoughts: Denies HI, intention, plan   Alertness/Orientation: Alert, partially oriented  Insight: Improved Judgment: Improved  Memory: Limited  Executive Functions  Concentration: Limited, easily distracted  Attention Span: Limited Recall: Limited Fund of Knowledge: Limited  Assets  Assets:Communication Skills; Desire for Improvement; Physical Health    Physical Exam: Physical Exam ROS Blood pressure 105/79, pulse (!) 58, temperature 97.9 F (36.6 C), temperature source Oral, resp. rate 16, height 6' (1.829 m), weight 82.1 kg, SpO2 100 %. Body mass index is 24.55 kg/m.   Treatment Plan Summary:  ASSESSMENT:  Diagnoses / Active Problems: Principal Problem: Schizophrenia ([place]) Diagnosis: Principal Problem:   Schizophrenia ([place]) Active Problems:   Tardive dyskinesia   PLAN: Safety and Monitoring:             -- Involuntary admission to inpatient psychiatric unit for safety, stabilization and treatment             -- Daily contact with patient to assess and evaluate symptoms and progress in treatment             -- Patient's case to be discussed in multi-disciplinary team meeting             -- Observation Level : q15 minute checks             -- Vital signs:  q12 hours             -- Precautions: suicide, elopement, and assault   2. Medications:              Continue Depakote 500 mg twice daily for mood stabilization and agitation                         Titrate oral Haldol to 10 mg twice daily to target better control of psychosis.  Continue Haldol decanoate injection to help with long-term compliance if effective and well-tolerated Haldol decanoate 100 mg IM injection today 8/25 to be followed by 50 or 100 mg IM injection in 3 days prior to discharge.             Continue Cogentin 1 mg twice daily for EPS  Start [name] 40 mg daily for 1 week then titrate to 80 mg daily to target tardive dyskinesia             Continue Neurontin 400 mg 3 times daily for mood and anxiety                        Continue trazodone150 mg at bedtime for sleep             Continue Norvasc 5 mg daily for hypertension, consider titrating up to 10 mg if needed             Continue vitamin D weekly for vitamin D deficiency             Risperidone, Ativan and Geodon as needed for episodic psychotic agitation               RPR reactive, titer 1:2, Patient denies h/o treatment for syphilis, patient is poor historian, consulted with ID over the phone, chart and case were reviewed, T pallidum Abs negative, no need for treatment at this time, results consistent with false positive result    The risks/benefits/side-effects/alternatives to this medication were discussed in detail with the patient and time was given for questions. The patient consents to medication trial.                -- Metabolic profile and EKG monitoring obtained while on an atypical antipsychotic (BMI: Lipid Panel:  HbgA1c: QTc:)                            3. Labs Reviewed: Comprehensive metabolic panel no significant abnormalities noted, CBC no significant abnormalities noted, Depakote level 18 below therapeutic on 8/18 and 59 on 7/30, RPR reactive, T Pallidum Abs negative, titer 1:2 8/15, HIV negative 8/18, UA no significant abnormalities noted, urine drug screen -8/18, EKG 7/31 QTc 423 MRI brain 7/20 no  abnormalities noted Lipid panel 8/23 within normal level except for low HDL 36, hemoglobin A1c elevated 6.3 Depakote level 12/2640 low therapeutic, LFT pending, ammonia level pending     Lab ordered:  LFT and ammonia level on 8/26 to ensure therapeutic level and no toxicity   4. Tobacco Use Disorder             -- Discontinued NicoDerm patch per patient's request, reported increased his craving, offered patient Nicorette gum but patient refuses, will follow.             -- Smoking cessation encouraged   5. Group and Therapy: -- Encouraged patient to participate in unit milieu and in scheduled group therapies                 6. Discharge Planning:              -- Social work and case management to assist with discharge planning and identification of hospital follow-up needs prior to discharge             -- Estimated LOS: 5-7 days             -- Discharge Concerns: Need to establish a safety plan; Medication compliance and effectiveness             -- Discharge Goals: Return home with outpatient referrals for mental health follow-up including medication management/psychotherapy  At time of discharge recommend outpatient follow-up with primary care provider for prediabetic hemoglobin A1c to monitor and follow.   The patient is agreeable with the medication plan, as above. We will monitor the patient's response to pharmacologic treatment, and adjust medications as necessary. Patient is encouraged to participate in group therapy while admitted to the psychiatric unit. We will address other chronic and acute stressors, which contributed to the patient's worsening psychosis and physical aggression, in order to reduce the risk of self-harm at discharge.     Physician Treatment Plan for Primary Diagnosis: Schizophrenia (HCC) Long Term Goal(s): Improvement in symptoms so as ready for discharge   Short Term Goals: Ability to identify changes in lifestyle to reduce recurrence of condition will  improve, Ability to verbalize feelings will improve, Ability to disclose and discuss suicidal ideas, Ability to demonstrate self-control will improve, Ability to identify and develop effective coping behaviors will improve, Ability to maintain clinical measurements within normal limits will improve, and Compliance with prescribed medications will improve     Total Time Spent in Direct Patient Care:  I personally spent 35 minutes on the unit in direct patient care. The direct patient care time included face-to-face time with the patient, reviewing the patient's chart, communicating with other professionals, and coordinating care. Greater than 50% of this time was spent in counseling or coordinating care with the patient regarding goals of hospitalization, psycho-education, and discharge planning needs.   Liliane Mallis Abbott Pao, MD 01/02/2022, 11:45 AM

## 2022-01-03 ENCOUNTER — Encounter (HOSPITAL_COMMUNITY): Payer: Self-pay

## 2022-01-03 MED ORDER — HALOPERIDOL DECANOATE 100 MG/ML IM SOLN
100.0000 mg | Freq: Once | INTRAMUSCULAR | Status: AC
Start: 2022-01-03 — End: 2022-01-03
  Administered 2022-01-03: 100 mg via INTRAMUSCULAR
  Filled 2022-01-03: qty 1

## 2022-01-03 NOTE — [facility] Counselor (Signed)
CSW spoke with Passenger transport manager at [employer] regarding getting connected to services.  Intake coordinator is out today and will be back tomorrow.  CSW will call [phone] tomorrow to get connected to services prior to expected discharge for later in the week.    Jahlon Baines, LCSW, LCAS Clincal Social Worker  [facility]

## 2022-01-03 NOTE — Group Note (Signed)
LCSW Group Therapy Note   Group Date: 01/03/2022 Start Time: 1300 End Time: 1400   Type of Therapy and Topic:  Group Therapy: Boundaries  Participation Level:  Did Not Attend  Description of Group: This group will address the use of boundaries in their personal lives. Patients will explore why boundaries are important, the difference between healthy and unhealthy boundaries, and negative and postive outcomes of different boundaries and will look at how boundaries can be crossed.  Patients will be encouraged to identify current boundaries in their own lives and identify what kind of boundary is being set. Facilitators will guide patients in utilizing problem-solving interventions to address and correct types boundaries being used and to address when no boundary is being used. Understanding and applying boundaries will be explored and addressed for obtaining and maintaining a balanced life. Patients will be encouraged to explore ways to assertively make their boundaries and needs known to significant others in their lives, using other group members and facilitator for role play, support, and feedback.  Therapeutic Goals:  1.  Patient will identify areas in their life where setting clear boundaries could be  used to improve their life.  2.  Patient will identify signs/triggers that a boundary is not being respected. 3.  Patient will identify two ways to set boundaries in order to achieve balance in  their lives: 4.  Patient will demonstrate ability to communicate their needs and set boundaries  through discussion and/or role plays  Summary of Patient Progress:  Did not attend  Therapeutic Modalities:   Cognitive Behavioral Therapy Solution-Focused Therapy  Beatris Si, LCSW 01/03/2022  2:02 PM

## 2022-01-03 NOTE — Progress Notes (Signed)
Adult Psychoeducational Group Note  Date:  01/03/2022 Time:  9:05 PM  Group Topic/Focus:  Wrap-Up Group:   The focus of this group is to help patients review their daily goal of treatment and discuss progress on daily workbooks.  Participation Level:  Did Not Attend  Participation Quality:   Did Not Attend  Affect:   Did Not Attend  Cognitive:   Did Not Attend  Insight: None  Engagement in Group:   Did Not Attend  Modes of Intervention:   Did Not Attend  Additional Comments:  Pt was encouraged to attend wrap up group but did not attend.  Felipa Furnace 01/03/2022, 9:05 PM

## 2022-01-03 NOTE — BH IP Treatment Plan (Signed)
Interdisciplinary Treatment and Diagnostic Plan Update  01/03/2022 Time of Session: 0830 Mark [name] MRN: [id number]  Principal Diagnosis: Schizophrenia [facility])  Secondary Diagnoses: Principal Problem:   Schizophrenia (HCC) Active Problems:   Tardive dyskinesia   Current Medications:  Current Facility-Administered Medications  Medication Dose Route Frequency Provider Last Rate Last Admin   acetaminophen (TYLENOL) tablet 650 mg  650 mg Oral Q4H PRN Eligha Bridegroom, NP   650 mg at 01/02/22 2102   alum & mag hydroxide-simeth (MAALOX/MYLANTA) 200-200-20 MG/5ML suspension 30 mL  30 mL Oral Q6H PRN Eligha Bridegroom, NP       amLODipine (NORVASC) tablet 5 mg  5 mg Oral Daily Eligha Bridegroom, NP   5 mg at 01/03/22 0815   benztropine (COGENTIN) tablet 1 mg  1 mg Oral BID Eligha Bridegroom, NP   1 mg at 01/03/22 0815   divalproex (DEPAKOTE ER) 24 hr tablet 500 mg  500 mg Oral BID Eligha Bridegroom, NP   500 mg at 01/03/22 0815   gabapentin (NEURONTIN) capsule 400 mg  400 mg Oral TID Eligha Bridegroom, NP   400 mg at 01/03/22 1125   haloperidol (HALDOL) tablet 10 mg  10 mg Oral BID Abbott Pao, Nadir, MD   10 mg at 01/03/22 0815   risperiDONE (RISPERDAL M-TABS) disintegrating tablet 2 mg  2 mg Oral Q8H PRN Eligha Bridegroom, NP       And   LORazepam (ATIVAN) tablet 1 mg  1 mg Oral PRN Eligha Bridegroom, NP       And   ziprasidone (GEODON) injection 20 mg  20 mg Intramuscular PRN Eligha Bridegroom, NP       LORazepam (ATIVAN) tablet 1 mg  1 mg Oral Q6H PRN Abbott Pao, Nadir, MD   1 mg at 01/02/22 1226   magnesium hydroxide (MILK OF MAGNESIA) suspension 30 mL  30 mL Oral Daily PRN Eligha Bridegroom, NP       ondansetron (ZOFRAN) tablet 4 mg  4 mg Oral Q8H PRN Eligha Bridegroom, NP       traZODone (DESYREL) tablet 150 mg  150 mg Oral QHS Attiah, Nadir, MD   150 mg at 01/02/22 2102   valbenazine ([name]) capsule 40 mg  40 mg Oral Daily Attiah, Nadir, MD   40 mg at 01/03/22 0818   Vitamin D (Ergocalciferol)  (DRISDOL) 1.25 MG (50000 UNIT) capsule 50,000 Units  50,000 Units Oral Weekly Eligha Bridegroom, NP   50,000 Units at 01/02/22 0950   PTA Medications: Medications Prior to Admission  Medication Sig Dispense Refill Last Dose   amLODipine (NORVASC) 5 MG tablet Take 1 tablet (5 mg total) by mouth daily. 30 tablet 0    aspirin EC 81 MG tablet Take 81 mg by mouth daily. Swallow whole.      benztropine (COGENTIN) 1 MG tablet Take 1 tablet (1 mg total) by mouth 2 (two) times daily. 60 tablet 0    cloZAPine (CLOZARIL) 100 MG tablet Take 300 mg by mouth at bedtime.      cyclobenzaprine (FLEXERIL) 10 MG tablet Take 10 mg by mouth 3 (three) times daily as needed for muscle spasms.      divalproex (DEPAKOTE ER) 500 MG 24 hr tablet Take 1 tablet (500 mg total) by mouth 2 (two) times daily. 60 tablet 0    ergocalciferol (VITAMIN D2) 1.25 MG (50000 UT) capsule Take 50,000 Units by mouth once a week.      gabapentin (NEURONTIN) 100 MG capsule Take 100 mg by mouth daily.  gabapentin (NEURONTIN) 400 MG capsule Take 400 mg by mouth 3 (three) times daily.      gabapentin (NEURONTIN) 400 MG capsule Take 400 mg by mouth 3 (three) times daily.      haloperidol (HALDOL) 5 MG tablet Take 1 tablet (5 mg total) by mouth 2 (two) times daily. 60 tablet 0    hydrOXYzine (VISTARIL) 25 MG capsule Take 2 capsules by mouth 2 (two) times daily.      metoprolol tartrate (LOPRESSOR) 50 MG tablet Take 1 tablet (50 mg total) by mouth 2 (two) times daily. 60 tablet 0    omeprazole (PRILOSEC) 20 MG capsule Take 20 mg by mouth daily.      pantoprazole (PROTONIX) 20 MG tablet Take 1 tablet (20 mg total) by mouth daily. 30 tablet 0    propranolol (INDERAL) 10 MG tablet Take 1 tablet (10 mg total) by mouth daily. 30 tablet 0    traZODone (DESYREL) 50 MG tablet Take 50 mg by mouth at bedtime.       Patient Stressors: Marital or family conflict   Medication change or noncompliance    Patient Strengths: Motivation for  treatment/growth  Supportive family/friends   Treatment Modalities: Medication Management, Group therapy, Case management,  1 to 1 session with clinician, Psychoeducation, Recreational therapy.   Physician Treatment Plan for Primary Diagnosis: Schizophrenia (HCC) Long Term Goal(s): Improvement in symptoms so as ready for discharge   Short Term Goals: Ability to identify changes in lifestyle to reduce recurrence of condition will improve Ability to verbalize feelings will improve Ability to disclose and discuss suicidal ideas Ability to demonstrate self-control will improve Ability to identify and develop effective coping behaviors will improve Ability to maintain clinical measurements within normal limits will improve Compliance with prescribed medications will improve  Medication Management: Evaluate patient's response, side effects, and tolerance of medication regimen.  Therapeutic Interventions: 1 to 1 sessions, Unit Group sessions and Medication administration.  Evaluation of Outcomes: Progressing  Physician Treatment Plan for Secondary Diagnosis: Principal Problem:   Schizophrenia (HCC) Active Problems:   Tardive dyskinesia  Long Term Goal(s): Improvement in symptoms so as ready for discharge   Short Term Goals: Ability to identify changes in lifestyle to reduce recurrence of condition will improve Ability to verbalize feelings will improve Ability to disclose and discuss suicidal ideas Ability to demonstrate self-control will improve Ability to identify and develop effective coping behaviors will improve Ability to maintain clinical measurements within normal limits will improve Compliance with prescribed medications will improve     Medication Management: Evaluate patient's response, side effects, and tolerance of medication regimen.  Therapeutic Interventions: 1 to 1 sessions, Unit Group sessions and Medication administration.  Evaluation of Outcomes:  Progressing   RN Treatment Plan for Primary Diagnosis: Schizophrenia (HCC) Long Term Goal(s): Knowledge of disease and therapeutic regimen to maintain health will improve  Short Term Goals: Ability to remain free from injury will improve, Ability to verbalize frustration and anger appropriately will improve, Ability to demonstrate self-control, Ability to participate in decision making will improve, Ability to verbalize feelings will improve, Ability to disclose and discuss suicidal ideas, Ability to identify and develop effective coping behaviors will improve, and Compliance with prescribed medications will improve  Medication Management: RN will administer medications as ordered by provider, will assess and evaluate patient's response and provide education to patient for prescribed medication. RN will report any adverse and/or side effects to prescribing provider.  Therapeutic Interventions: 1 on 1 counseling sessions, Psychoeducation, Medication administration,  Evaluate responses to treatment, Monitor vital signs and CBGs as ordered, Perform/monitor CIWA, COWS, AIMS and Fall Risk screenings as ordered, Perform wound care treatments as ordered.  Evaluation of Outcomes: Progressing   LCSW Treatment Plan for Primary Diagnosis: Schizophrenia (HCC) Long Term Goal(s): Safe transition to appropriate next level of care at discharge, Engage patient in therapeutic group addressing interpersonal concerns.  Short Term Goals: Engage patient in aftercare planning with referrals and resources, Increase social support, Increase ability to appropriately verbalize feelings, Increase emotional regulation, Facilitate acceptance of mental health diagnosis and concerns, Facilitate patient progression through stages of change regarding substance use diagnoses and concerns, Identify triggers associated with mental health/substance abuse issues, and Increase skills for wellness and recovery  Therapeutic Interventions:  Assess for all discharge needs, 1 to 1 time with Social worker, Explore available resources and support systems, Assess for adequacy in community support network, Educate family and significant other(s) on suicide prevention, Complete Psychosocial Assessment, Interpersonal group therapy.  Evaluation of Outcomes: Progressing   Progress in Treatment: Attending groups: No. Participating in groups: No. Taking medication as prescribed: Yes. Toleration medication: Yes. Family/Significant other contact made: No, will contact:  unable to reach [name], [phone] and father,  Patient understands diagnosis: No. Discussing patient identified problems/goals with staff: Yes. Medical problems stabilized or resolved: Yes. Denies suicidal/homicidal ideation: No. Issues/concerns per patient self-inventory: Yes. Other: none  New problem(s) identified: No, Describe:  none  New Short Term/Long Term Goal(s): Patient to work towards detox, elimination of symptoms of psychosis, medication management for mood stabilization; elimination of SI thoughts; development of comprehensive mental wellness/sobriety plan.  Patient Goals: No additional goals identified at this time. Patient to continue to work towards original goals identified in initial treatment team meeting. CSW will remain available to patient should they voice additional treatment goals.   Discharge Plan or Barriers: No psychosocial barriers identified at this time, patient to return to place of residence when appropriate for discharge.   Reason for Continuation of Hospitalization: Other; describe psychosis   Estimated Length of Stay: 1-7 days   Scribe for Treatment Team: Almedia Balls 01/03/2022 12:53 PM

## 2022-01-03 NOTE — Progress Notes (Signed)
   01/03/22 2015  Psych Admission Type (Psych Patients Only)  Admission Status Involuntary  Psychosocial Assessment  Patient Complaints None  Eye Contact Fair  Facial Expression Flat  Affect Appropriate to circumstance  Speech Soft  Interaction Cautious;Isolative  Motor Activity Tremors  Appearance/Hygiene Disheveled  Behavior Characteristics Appropriate to situation  Mood Preoccupied;Pleasant  Aggressive Behavior  Effect No apparent injury  Thought Process  Coherency WDL  Content WDL  Delusions WDL  Perception WDL  Hallucination None reported or observed  Judgment WDL  Confusion None  Danger to Self  Current suicidal ideation? Denies  Danger to Others  Danger to Others None reported or observed

## 2022-01-03 NOTE — Group Note (Signed)
Recreation Therapy Group Note   Group Topic:Coping Skills  Group Date: 01/03/2022 Start Time: 1000 End Time: 1035 Facilitators: Caroll Rancher, LRT,CTRS Location: [address] Dayroom   Goal Area(s) Addresses:  Patient will identify positive coping skill techniques. Patient will identify benefits of using positive coping skills post d/c.  Group Description: Mind Map.  Patient was provided a blank template of a diagram with 32 blank boxes in a tiered system, branching from the center (similar to a bubble chart). LRT directed patients to label the middle of the diagram "Coping Skills" and consider 8 different sources in which coping skills would be needed.  Patients and LRT filled in the first 8 boxes together with 8 sources coping skills could be used (anger, stress, isolation, emotions, depression, anxiety, relationships and communication/cooperation.  Patients were to then come up with 3 effective coping techniques to address each identified area in the remaining boxes stemming from a particular source. Pts were encouraged to share ideas with one another and ask for suggestions of peers and Clinical research associate when stuck on a certain category.   Affect/Mood: N/A   Participation Level: Did not attend    Clinical Observations/Individualized Feedback:     Plan: Continue to engage patient in RT group sessions 2-3x/week.   Caroll Rancher, LRT,CTRS 01/03/2022 12:07 PM

## 2022-01-03 NOTE — [facility] Suicide Risk Assessment (Signed)
[facility] INPATIENT:  Family/Significant Other Suicide Prevention Education  Suicide Prevention Education:  Education Completed; father, Tukker Byrns,  [phone] (name of family member/significant other) has been identified by the patient as the family member/significant other with whom the patient will be residing, and identified as the person(s) who will aid the patient in the event of a mental health crisis (suicidal ideations/suicide attempt).  With written consent from the patient, the family member/significant other has been provided the following suicide prevention education, prior to the and/or following the discharge of the patient.  Father confirmed no guns/weapons or additional safety concerns.  Father can pick patient up at discharge.    The suicide prevention education provided includes the following: Suicide risk factors Suicide prevention and interventions National Suicide Hotline telephone number [facility] assessment telephone number [facility] Emergency Assistance 911 [facility] and/or Residential Mobile Crisis Unit telephone number  Request made of family/significant other to: Remove weapons (e.g., guns, rifles, knives), all items previously/currently identified as safety concern.   Remove drugs/medications (over-the-counter, prescriptions, illicit drugs), all items previously/currently identified as a safety concern.  The family member/significant other verbalizes understanding of the suicide prevention education information provided.  The family member/significant other agrees to remove the items of safety concern listed above.  Chrishaun Sasso E Abisola Carrero 01/03/2022, 2:53 PM

## 2022-01-03 NOTE — Progress Notes (Signed)
[facility] MD Progress Note  01/03/2022 10:14 AM Mark [name]  MRN:  [id number]   Reason for Admission:  Mark [name] Mark [name] is a 61 y.o., male with a past psychiatric history significant for schizophrenia who presents to the [facility] from [facility] emergency room for evaluation and management of increased psychosis and agitation. The patient is currently on Hospital Day 7.   Chart Review from last 24 hours:  The patient's chart was reviewed and nursing notes were reviewed. The patient's case was discussed in multidisciplinary team meeting. Per [facility] patient is compliant with his scheduled medications in the hospital including Cogentin and Depakote Neurontin Haldol trazodone Inderal and Norvasc.  He did receive 1 dose of Ativan as needed yesterday early afternoon for increased anxiety and shakes, no as needed medication were needed or given for agitation or aggression.  Information Obtained Today During Patient Interview: The patient was seen and evaluated in his room.  He presents calm with no irritability or anxiety noted.  He notes good sleep last night and today he denies SI HI or AVH he denies even any incidents of talking to himself which he has been reporting over the past few days.  He denies any side effect to current medication regimen, blood pressure was noted to have improved yesterday even prior to given Ativan which was given once as needed early afternoon.  He continues to present with involuntary movement upper more than lower extremities left more than right but seems to be slightly better this morning.  He displays no signs consistent with EPS at this time.  He reports good appetite, participating in the milieu and attending groups with no problems noted.  Discussed with patient receiving second dose of Haldol decannulate 100 mg today with plan to discharge on Wednesday morning if continues to improve.    Sleep  Sleep: Interrupted  Principal Problem: Schizophrenia  ([place]) Diagnosis: Principal Problem:   Schizophrenia (Mark [name]) Active Problems:   Tardive dyskinesia    Past Psychiatric History: Prior Psychiatric diagnoses: Schizophrenia Past Psychiatric Hospitalizations: At least twice last time February 2023 at this facility   History of self mutilation: Denies Past suicide attempts: Denies Past history of HI, violent or aggressive behavior: History of aggression towards family members as well as history of assault charges 3 weeks ago and patient reports was in prison for 2 years for armed robbery and physical assault in the past   Past Psychiatric medications trials: Depakote, clozapine, Haldol, Cogentin, gabapentin, trazodone, Vistaril with varying efficacy History of ECT/TMS: Denies   Outpatient psychiatric Follow up: Unknown but reported lack of compliance Prior Outpatient Therapy: Unknown  Past Medical History:  Past Medical History:  Diagnosis Date   COPD (chronic obstructive pulmonary disease) ([place])    Hypertension    Schizophrenia ([place])     Past Surgical History:  Procedure Laterality Date   LOWER EXTREMITY ANGIOGRAPHY N/A 09/18/2017   Procedure: LOWER EXTREMITY ANGIOGRAPHY- Recheck lysis;  Surgeon: Waynetta Sandy, MD;  Location: [facility];  Service: Cardiovascular;  Laterality: N/A;   PERIPHERAL VASCULAR INTERVENTION Left 09/18/2017   Procedure: PERIPHERAL VASCULAR INTERVENTION;  Surgeon: Waynetta Sandy, MD;  Location: [facility];  Service: Cardiovascular;  Laterality: Left;   THROMBECTOMY FEMORAL ARTERY Left 09/17/2017   Procedure: POSSIBLE THROMBECTOMY;  Surgeon: Waynetta Sandy, MD;  Location: [place];  Service: Vascular;  Laterality: Left;   VENOGRAM Left 09/17/2017   Procedure: ULTRASOUND POPLITEAL ACCESS; CENTRAL VENOGRAM, IVVS, LYSIS CATHETER PLACEMENT;  Surgeon: Waynetta Sandy, MD;  Location: [facility] OR;  Service: Vascular;  Laterality: Left;   Family History: History reviewed.  No pertinent family history. Family Psychiatric  History: Denies any Social History: Living situation: Lives with his father, brother and cousin Social support: Limited Marital Status: Never married Children: Patient reports he has 7 children 4 boys and 3 girls live with her mother in [place], all from same mother per patient's report Education: 9 grade Employment: Unemployed, reports on disability but unable to specify since when [employer] service: Denies Legal history: Denies pending charges or court dates but reports 3 weeks ago was in jail for physical assault but no pending court dates, reports was in prison in the past for 2 years for armed robbery and assault on Engineer, structural. Trauma: Denies Access to guns: Denies  Current Medications: Current Facility-Administered Medications  Medication Dose Route Frequency Provider Last Rate Last Admin   acetaminophen (TYLENOL) tablet 650 mg  650 mg Oral Q4H PRN Vesta Mixer, NP   650 mg at 01/02/22 2102   alum & mag hydroxide-simeth (MAALOX/MYLANTA) 200-200-20 MG/5ML suspension 30 mL  30 mL Oral Q6H PRN Vesta Mixer, NP       amLODipine (NORVASC) tablet 5 mg  5 mg Oral Daily Vesta Mixer, NP   5 mg at 01/03/22 0815   benztropine (COGENTIN) tablet 1 mg  1 mg Oral BID Vesta Mixer, NP   1 mg at 01/03/22 0815   divalproex (DEPAKOTE ER) 24 hr tablet 500 mg  500 mg Oral BID Vesta Mixer, NP   500 mg at 01/03/22 0815   gabapentin (NEURONTIN) capsule 400 mg  400 mg Oral TID Vesta Mixer, NP   400 mg at 01/03/22 [id number]   haloperidol (HALDOL) tablet 10 mg  10 mg Oral BID Dian Situ, MD   10 mg at 01/03/22 0815   haloperidol decanoate (HALDOL DECANOATE) 100 MG/ML injection 100 mg  100 mg Intramuscular Once Winfred Leeds, Maecyn Panning, MD       risperiDONE (RISPERDAL M-TABS) disintegrating tablet 2 mg  2 mg Oral Q8H PRN Vesta Mixer, NP       And   LORazepam (ATIVAN) tablet 1 mg  1 mg Oral PRN Vesta Mixer, NP       And    ziprasidone (GEODON) injection 20 mg  20 mg Intramuscular PRN Vesta Mixer, NP       LORazepam (ATIVAN) tablet 1 mg  1 mg Oral Q6H PRN Winfred Leeds, Daron Breeding, MD   1 mg at 01/02/22 1226   magnesium hydroxide (MILK OF MAGNESIA) suspension 30 mL  30 mL Oral Daily PRN Vesta Mixer, NP       ondansetron (ZOFRAN) tablet 4 mg  4 mg Oral Q8H PRN Vesta Mixer, NP       traZODone (DESYREL) tablet 150 mg  150 mg Oral QHS Tyrica Afzal, MD   150 mg at 01/02/22 2102   valbenazine ([name]) capsule 40 mg  40 mg Oral Daily Kerrington Greenhalgh, MD   40 mg at 01/03/22 0818   Vitamin D (Ergocalciferol) (DRISDOL) 1.25 MG (50000 UNIT) capsule 50,000 Units  50,000 Units Oral Weekly Vesta Mixer, NP   50,000 Units at 01/02/22 [id number]    Lab Results:  No results found for this or any previous visit (from the past 48 hour(s)).   Blood Alcohol level:  Lab Results  Component Value Date   [facility] <10 12/03/2021   ETH <10 [id number]    Metabolic Disorder Labs: Lab Results  Component Value Date  HGBA1C 6.3 (H) 12/29/2021   MPG 134.11 12/29/2021   MPG 128 06/28/2021   No results found for: "PROLACTIN" Lab Results  Component Value Date   CHOL 140 12/29/2021   TRIG 134 12/29/2021   HDL 36 (L) 12/29/2021   CHOLHDL 3.9 12/29/2021   VLDL 27 12/29/2021   LDLCALC 77 12/29/2021   LDLCALC 92 07/04/2021    Physical Findings: AIMS: Facial and Oral Movements Muscles of Facial Expression: None, normal Lips and Perioral Area: None, normal Jaw: None, normal Tongue: None, normal,Extremity Movements Upper (arms, wrists, hands, fingers): Moderate Lower (legs, knees, ankles, toes): Mild, Trunk Movements Neck, shoulders, hips: Minimal, Overall Severity Severity of abnormal movements (highest score from questions above): Moderate Incapacitation due to abnormal movements: Mild Patient's awareness of abnormal movements (rate only patient's report): Aware, mild distress, Dental Status Current problems with teeth and/or  dentures?: No Does patient usually wear dentures?: No  CIWA:    COWS:     Musculoskeletal: Strength & Muscle Tone: within normal limits Gait & Station: normal Patient leans: N/A  Psychiatric Specialty Exam:  General Appearance: Better hygiene noted, dressed in hospital scrubs, appears older than stated age Involuntary upper extremities and lower extremities movement noted mild in severity.  Behavior: Calm and cooperative  Psychomotor Activity: No agitation or retardation noted  Eye Contact: Fair Speech: Normal amount tone and volume   Mood: Euthymic Affect: Congruent, restricted  Thought Process: Linear, concrete Descriptions of Associations: Intact with much more organized and linear thought process noted Thought Content: Hallucinations: Denies AVH, does not appear responding to stimuli during evaluation Delusions: No paranoia noted Suicidal Thoughts: Denies SI, intention, plan  Homicidal Thoughts: Denies HI, intention, plan   Alertness/Orientation: Alert, partially oriented  Insight: Improved Judgment: Improved  Memory: Limited  Executive Functions  Concentration: Limited, easily distracted Attention Span: Limited Recall: Limited Fund of Knowledge: Limited  Assets  Assets:Communication Skills; Desire for Improvement; Physical Health    Physical Exam: Physical Exam ROS Blood pressure 116/82, pulse 68, temperature 97.6 F (36.4 C), temperature source Oral, resp. rate 20, height 6' (1.829 m), weight 82.1 kg, SpO2 100 %. Body mass index is 24.55 kg/m.   Treatment Plan Summary:  ASSESSMENT:  Diagnoses / Active Problems: Principal Problem: Schizophrenia (HCC) Diagnosis: Principal Problem:   Schizophrenia (HCC) Active Problems:   Tardive dyskinesia   PLAN: Safety and Monitoring:             -- Involuntary admission to inpatient psychiatric unit for safety, stabilization and treatment             -- Daily contact with patient to assess and evaluate  symptoms and progress in treatment             -- Patient's case to be discussed in multi-disciplinary team meeting             -- Observation Level : q15 minute checks             -- Vital signs:  q12 hours             -- Precautions: suicide, elopement, and assault   2. Medications:             Continue Depakote 500 mg twice daily for mood stabilization and agitation                         Continue oral Haldol 10 mg twice daily to target better control of psychosis.  Continue Haldol  decanoate injection to help with long-term compliance if effective and well-tolerated Haldol decanoate 100 mg IM injection today 8/25, second dose of Haldol decanoate injection 100 mg IM given 8/28           Continue [name] 40 mg daily for 1 week then titrate to 80 mg daily to target tardive dyskinesia             Continue Neurontin 400 mg 3 times daily for mood and anxiety                        Continue trazodone150 mg at bedtime for sleep             Continue Norvasc 5 mg daily for hypertension, consider titrating up to 10 mg if needed             Continue vitamin D weekly for vitamin D deficiency             Risperidone, Ativan and Geodon as needed for episodic psychotic agitation               RPR reactive, titer 1:2, Patient denies h/o treatment for syphilis, patient is poor historian, consulted with ID over the phone, chart and case were reviewed, T pallidum Abs negative, no need for treatment at this time, results consistent with false positive result    The risks/benefits/side-effects/alternatives to this medication were discussed in detail with the patient and time was given for questions. The patient consents to medication trial.                -- Metabolic profile and EKG monitoring obtained while on an atypical antipsychotic (BMI: Lipid Panel: HbgA1c: QTc:)                            3. Labs Reviewed: Comprehensive metabolic panel no significant abnormalities noted, CBC no significant  abnormalities noted, Depakote level 18 below therapeutic on 8/18 and 59 on 7/30, RPR reactive, T Pallidum Abs negative, titer 1:2 8/15, HIV negative 8/18, UA no significant abnormalities noted, urine drug screen -8/18, EKG 7/31 QTc 423 MRI brain 7/20 no abnormalities noted Lipid panel 8/23 within normal level except for low HDL 36, hemoglobin A1c elevated 6.3 Depakote level 12/2640 low therapeutic, LFT pending, ammonia level pending     Lab ordered:  LFT and ammonia level on 8/26 to ensure therapeutic level and no toxicity   4. Tobacco Use Disorder             -- Discontinued NicoDerm patch per patient's request, reported increased his craving, offered patient Nicorette gum but patient refuses, will follow.             -- Smoking cessation encouraged   5. Group and Therapy: -- Encouraged patient to participate in unit milieu and in scheduled group therapies                 6. Discharge Planning:              -- Social work and case management to assist with discharge planning and identification of hospital follow-up needs prior to discharge             -- Estimated LOS: 5-7 days             -- Discharge Concerns: Need to establish a safety plan; Medication compliance and effectiveness             --  Discharge Goals: Return home with outpatient referrals for mental health follow-up including medication management/psychotherapy  At time of discharge recommend outpatient follow-up with primary care provider for prediabetic hemoglobin A1c to monitor and follow.   The patient is agreeable with the medication plan, as above. We will monitor the patient's response to pharmacologic treatment, and adjust medications as necessary. Patient is encouraged to participate in group therapy while admitted to the psychiatric unit. We will address other chronic and acute stressors, which contributed to the patient's worsening psychosis and physical aggression, in order to reduce the risk of self-harm at  discharge.     Physician Treatment Plan for Primary Diagnosis: Schizophrenia ([place]) Long Term Goal(s): Improvement in symptoms so as ready for discharge   Short Term Goals: Ability to identify changes in lifestyle to reduce recurrence of condition will improve, Ability to verbalize feelings will improve, Ability to disclose and discuss suicidal ideas, Ability to demonstrate self-control will improve, Ability to identify and develop effective coping behaviors will improve, Ability to maintain clinical measurements within normal limits will improve, and Compliance with prescribed medications will improve     Total Time Spent in Direct Patient Care:  I personally spent 35 minutes on the unit in direct patient care. The direct patient care time included face-to-face time with the patient, reviewing the patient's chart, communicating with other professionals, and coordinating care. Greater than 50% of this time was spent in counseling or coordinating care with the patient regarding goals of hospitalization, psycho-education, and discharge planning needs.   Kilani Joffe Winfred Leeds, MD 01/03/2022, 10:14 AM

## 2022-01-04 MED ORDER — HALOPERIDOL DECANOATE 100 MG/ML IM SOLN: 200.0000 mg | INTRAMUSCULAR | 0 refills | Status: DC

## 2022-01-04 MED ORDER — HALOPERIDOL DECANOATE 100 MG/ML IM SOLN: 200.0000 mg | INTRAMUSCULAR | Status: DC

## 2022-01-04 MED ORDER — VITAMIN D (ERGOCALCIFEROL) 1.25 MG (50000 UNIT) PO CAPS: 50000.0000 [IU] | ORAL_CAPSULE | ORAL | 0 refills | Status: DC

## 2022-01-04 MED ORDER — AMLODIPINE BESYLATE 10 MG PO TABS
10.0000 mg | ORAL_TABLET | Freq: Every day | ORAL | Status: DC
  Administered 2022-01-05: 10 mg via ORAL
  Filled 2022-01-04 (×3): qty 1

## 2022-01-04 MED ORDER — GABAPENTIN 400 MG PO CAPS: 400.0000 mg | ORAL_CAPSULE | Freq: Three times a day (TID) | ORAL | 0 refills | Status: DC

## 2022-01-04 MED ORDER — TRAZODONE HCL 150 MG PO TABS
150.0000 mg | ORAL_TABLET | Freq: Every day | ORAL | 0 refills | Status: DC
Start: 2022-01-04 — End: 2023-06-09

## 2022-01-04 MED ORDER — AMLODIPINE BESYLATE 10 MG PO TABS: 10.0000 mg | ORAL_TABLET | Freq: Every day | ORAL | 0 refills | Status: DC

## 2022-01-04 MED ORDER — BENZTROPINE MESYLATE 1 MG PO TABS: 1.0000 mg | ORAL_TABLET | Freq: Two times a day (BID) | ORAL | 0 refills | Status: DC

## 2022-01-04 MED ORDER — HALOPERIDOL 10 MG PO TABS: 10.0000 mg | ORAL_TABLET | Freq: Two times a day (BID) | ORAL | 0 refills | Status: DC

## 2022-01-04 MED ORDER — DIVALPROEX SODIUM ER 500 MG PO TB24: 500.0000 mg | ORAL_TABLET | Freq: Two times a day (BID) | ORAL | 0 refills | Status: DC

## 2022-01-04 MED ORDER — VALBENAZINE TOSYLATE 40 MG PO CAPS: 40.0000 mg | ORAL_CAPSULE | Freq: Every day | ORAL | 0 refills | Status: DC

## 2022-01-04 MED ORDER — AMLODIPINE BESYLATE 5 MG PO TABS
5.0000 mg | ORAL_TABLET | Freq: Once | ORAL | Status: AC
  Administered 2022-01-04: 5 mg via ORAL
  Filled 2022-01-04: qty 1

## 2022-01-04 NOTE — [facility] Suicide Risk Assessment (Incomplete)
[facility] Discharge Suicide Risk Assessment   Principal Problem: Schizophrenia [facility]) Discharge Diagnoses: Principal Problem:   Schizophrenia (HCC) Active Problems:   Tardive dyskinesia   Total Time spent with patient: 45 minutes  Reason for admission: Mark [name] is a 61 y.o., male with a past psychiatric history significant for schizophrenia who presents to the [facility] from [facility] emergency room for evaluation and management of increased psychosis and agitation.  According to outside records, the patient presented under IVC taken by family noncompliant with medications at home has diagnoses of schizophrenia, increasingly psychotic and agitated, in emergency room he was noted to be pacing talking and laughing to himself with some mild agitation noted.  PTA Medications: non compliant with home meds Medications Prior to Admission  Medication Sig Dispense Refill Last Dose   amLODipine (NORVASC) 5 MG tablet Take 1 tablet (5 mg total) by mouth daily. 30 tablet 0     aspirin EC 81 MG tablet Take 81 mg by mouth daily. Swallow whole.         benztropine (COGENTIN) 1 MG tablet Take 1 tablet (1 mg total) by mouth 2 (two) times daily. 60 tablet 0     cloZAPine (CLOZARIL) 100 MG tablet Take 300 mg by mouth at bedtime.         cyclobenzaprine (FLEXERIL) 10 MG tablet Take 10 mg by mouth 3 (three) times daily as needed for muscle spasms.         divalproex (DEPAKOTE ER) 500 MG 24 hr tablet Take 1 tablet (500 mg total) by mouth 2 (two) times daily. 60 tablet 0     ergocalciferol (VITAMIN D2) 1.25 MG (50000 UT) capsule Take 50,000 Units by mouth once a week.         gabapentin (NEURONTIN) 100 MG capsule Take 100 mg by mouth daily.         gabapentin (NEURONTIN) 400 MG capsule Take 400 mg by mouth 3 (three) times daily.         gabapentin (NEURONTIN) 400 MG capsule Take 400 mg by mouth 3 (three) times daily.         haloperidol (HALDOL) 5 MG tablet Take 1 tablet (5 mg total) by mouth  2 (two) times daily. 60 tablet 0     hydrOXYzine (VISTARIL) 25 MG capsule Take 2 capsules by mouth 2 (two) times daily.         metoprolol tartrate (LOPRESSOR) 50 MG tablet Take 1 tablet (50 mg total) by mouth 2 (two) times daily. 60 tablet 0     omeprazole (PRILOSEC) 20 MG capsule Take 20 mg by mouth daily.         pantoprazole (PROTONIX) 20 MG tablet Take 1 tablet (20 mg total) by mouth daily. 30 tablet 0     propranolol (INDERAL) 10 MG tablet Take 1 tablet (10 mg total) by mouth daily. 30 tablet 0     traZODone (DESYREL) 50 MG tablet Take 50 mg by mouth at bedtime.           Hospital Course:   During the patient's hospitalization, patient had extensive initial psychiatric evaluation, and follow-up psychiatric evaluations every day.  Psychiatric diagnoses provided upon initial assessment: Schizophrenia, chronic paranoid type  Patient's psychiatric medications were adjusted on admission: Restart Depakote 500 mg twice daily for mood stabilization and agitation             Discontinue Zyprexa for history of lack of efficacy  Restart Haldol 5 mg twice daily for psychosis, monitor efficacy and safety and titrate accordingly, consider Haldol decanoate injection to help with long-term compliance if effective and well-tolerated             Restart Cogentin 1 mg twice daily for EPS             Restart Neurontin 400 mg 3 times daily for mood and anxiety             Restart Inderal 10 mg daily for anxiety and tachycardia, also will help with hypertension             Restarted trazodone 50 mg at bedtime for sleep             Restart Norvasc 5 mg daily for hypertension, consider titrating up to 10 mg if needed             Restart vitamin D weekly for vitamin D deficiency             Risperidone, Ativan and Geodon as needed for episodic psychotic agitation  During the hospitalization, other adjustments were made to the patient's psychiatric medication regimen: Haldol was titrated up to 10 mg  twice daily and patient was started on Haldol decanoate injection, received 2 injections 100 mg each 3 days apart last injection was given on 8/28 with next injection 200 mg IM due on 9/27.  Cogentin was continued 1 mg twice daily for EPS, Depakote was continued 500 mg twice daily for mood stabilization and agitation, Depakote level was obtained during hospital stay, slightly low therapeutic, gabapentin was continued 400 mg 3 times daily for mood and anxiety, Inderal was discontinued secondary to noted bradycardia, vitamin D was continued weekly for vitamin D deficiency, trazodone was titrated up to 150 mg daily at bedtime for sleep, Norvasc was titrated from 5 mg to 10 mg daily to address high blood pressure, [name] was added at dose of 40 mg daily for TD diagnosed during this hospitalization with recommendation to titrated up on outpatient basis.  Patient's care was discussed during the interdisciplinary team meeting every day during the hospitalization.  The patient denied having side effects to prescribed psychiatric medication.  Gradually, patient started adjusting to milieu. The patient was evaluated each day by a clinical provider to ascertain response to treatment. Improvement was noted by the patient's report of decreasing symptoms, improved sleep and appetite, affect, medication tolerance, behavior, and participation in unit programming.  Patient was asked each day to complete a self inventory noting mood, mental status, pain, new symptoms, anxiety and concerns.    Symptoms were reported as significantly decreased or resolved completely by discharge.   During hospital stay patient's involuntary movement was noted to be related more to TD, upon exam several times during evaluation he was noted to have involuntary movement upper more than lower extremities, left more than right hand, slightly improved with the start of [name] but ongoing at time of discharge.  Patient's episodes of responding  to stimuli talking and laughing to himself have decreased significantly with treatment, patient's thought process was much more linear later during hospitalization and he continuously denied any SI HI or AVH, his mood was noted to be stable after first 2 days of hospitalization with no further irritability noted. He did agree that medications have been helpful during this hospitalization and agreed to comply after discharge  On day of discharge, patient was evaluated on 01/05/2022, the patient reports that their mood is stable.  The patient denied having suicidal thoughts for more than 48 hours prior to discharge.  Patient denies having homicidal thoughts.  Patient denies having auditory hallucinations.  Patient denies any visual hallucinations or other symptoms of psychosis. The patient was motivated to continue taking medication with a goal of continued improvement in mental health.   The patient reports their target psychiatric symptoms of irritability, disorganized thought process and auditory hallucinations responded well to the psychiatric medications, and the patient reports overall benefit other psychiatric hospitalization. Supportive psychotherapy was provided to the patient. The patient also participated in regular group therapy while hospitalized. Coping skills, problem solving as well as relaxation therapies were also part of the unit programming.  Labs were reviewed with the patient, and abnormal results were discussed with the patient.  The patient is able to verbalize their individual safety plan to this provider.  Behavioral Events: None  Restraints: None  Groups: Attended and participated fairly after first 2 days of hospitalization  Medications Changes: As above  D/C Medications: Depakote 500 mg twice daily, Haldol 10 mg twice daily for 2 more weeks, Haldol LAI 200 mg IM monthly next dose due on 9/27, Cogentin 1 mg twice daily, gabapentin 400 mg 3 times daily, trazodone 150 mg at  bedtime, [name] 40 mg daily, vitamin D weekly, Norvasc 10 mg daily  Sleep  Sleep:No data recorded  Musculoskeletal: Strength & Muscle Tone: within normal limits Gait & Station: normal Patient leans: N/A  Psychiatric Specialty Exam  General Appearance: appears at stated age, fairly dressed and groomed  Behavior: pleasant and cooperative  Psychomotor Activity:No psychomotor agitation or retardation noted   Eye Contact: good Speech: normal amount, tone, volume and latency   Mood: euthymic Affect: congruent, pleasant and interactive  Thought Process: linear, goal directed, no circumstantial or tangential thought process noted, no racing thoughts or flight of ideas Descriptions of Associations: intact Thought Content: Hallucinations: denies AH, VH , does not appear responding to stimuli Delusions: No paranoia or other delusions noted Suicidal Thoughts: denies SI, intention, plan  Homicidal Thoughts: denies HI, intention, plan   Alertness/Orientation: alert and fully oriented  Insight: fair, improved Judgment: fair, improved  Memory: intact  Executive Functions  Concentration: intact  Attention Span: Fair Recall: intact Fund of Knowledge: fair   Executive Functions  Concentration: intact Attention Span: Fair Recall: intact Fund of Knowledge: fair   Assets  Assets:Communication Skills; Desire for Improvement; Physical Health   Physical Exam: Physical Exam ROS Blood pressure 122/67, pulse 60, temperature 98.2 F (36.8 C), temperature source Oral, resp. rate 20, height 6' (1.829 m), weight 82.1 kg, SpO2 100 %. Body mass index is 24.55 kg/m.  Mental Status Per Nursing Assessment::   On Admission:  NA  Demographic Factors:  Male, Low socioeconomic status, and Unemployed  Loss Factors: NA  Historical Factors: Impulsivity  Risk Reduction Factors:   Living with another person, especially a relative  Continued Clinical Symptoms: improved since  admission Schizophrenia:   Paranoid or undifferentiated type  Cognitive Features That Contribute To Risk:  Thought constriction (tunnel vision)    Suicide Risk:  Minimal: No identifiable suicidal ideation.  Patients presenting with no risk factors but with morbid ruminations; may be classified as minimal risk based on the severity of the depressive symptoms   Follow-up Information     [facility].   Why: Please continue to follow up with this provider for ongoing services. Contact information: [address] [place] [address] [phone]  [facility] Follow up on 01/11/2022.   Why: You have a hospital follow up appointment for therapy and medicaiton management services on  01/11/22 at 8:30 am.  This appointment will be a Virtual telehealth appointment. Contact information: [address]  [address] [place] [address] [phone]         [facility]. Call.   Why: You have been referred to this organization for [employer].  Please call to get set up with assessment intake and get connected to services. Contact information: (ph) [phone] ([phone] [address],  [place], [place] [address]        Fleet Contras, MD. Schedule an appointment as soon as possible for a visit.   Specialty: Internal Medicine Why: Please call as soon as possible to schedule a hospital follow up appointment with your primary care provider as we were unable to contact prior to discharge. Contact informationJanit Pagan [place] [address] [phone]                 Plan Of Care/Follow-up recommendations:   Discharge recommendations:    Activity: as tolerated  Diet: heart healthy  # It is recommended to the patient to continue psychiatric medications as prescribed, after discharge from the hospital.     # It is recommended to the patient to follow up with your outpatient psychiatric provider and  PCP.   # Prescriptions provided or sent directly to preferred pharmacy at discharge. Patient agreeable to plan. Given opportunity to ask questions. Appears to feel comfortable with discharge.    # In the event of worsening symptoms, the patient is instructed to call the crisis hotline, 911 and or go to the nearest ED for appropriate evaluation and treatment of symptoms. To follow-up with primary care provider for other medical issues, concerns and or health care needs   # Patient was discharged home with a plan to follow up as noted above.  -Follow-up with outpatient primary care doctor and other specialists -for management of chronic medical disease, including: OP follow up with PCP to monitor abnormal/prediabetic [id number], also for fu for hypertension   Patient agrees with D/C instructions and plan.  The patient received suicide prevention pamphlet:  Yes Belongings returned:  Clothing and Valuables  Total Time Spent in Direct Patient Care:  I personally spent 45 minutes on the unit in direct patient care. The direct patient care time included face-to-face time with the patient, reviewing the patient's chart, communicating with other professionals, and coordinating care. Greater than 50% of this time was spent in counseling or coordinating care with the patient regarding goals of hospitalization, psycho-education, and discharge planning needs.   Tyquon Near Abbott Pao 01/05/2022, 11:33 AM   Wendall Isabell Abbott Pao, MD 01/05/2022, 11:33 AM

## 2022-01-04 NOTE — Progress Notes (Signed)
   01/04/22 1100  Psych Admission Type (Psych Patients Only)  Admission Status Involuntary  Psychosocial Assessment  Patient Complaints Isolation  Eye Contact Fair  Facial Expression Flat  Affect Appropriate to circumstance  Speech Soft  Interaction Isolative  Motor Activity Tremors  Appearance/Hygiene In scrubs  Behavior Characteristics Appropriate to situation  Mood Depressed  Thought Process  Coherency WDL  Content WDL  Delusions WDL  Perception WDL  Hallucination None reported or observed  Judgment Impaired  Confusion None  Danger to Self  Current suicidal ideation? Denies  Danger to Others  Danger to Others None reported or observed

## 2022-01-04 NOTE — Progress Notes (Signed)
   01/04/22 2100  Psych Admission Type (Psych Patients Only)  Admission Status Involuntary  Psychosocial Assessment  Patient Complaints Isolation;Suspiciousness  Eye Contact Fair  Facial Expression Flat  Affect Appropriate to circumstance  Speech Soft  Interaction Cautious;Isolative  Motor Activity Tremors  Appearance/Hygiene Disheveled  Behavior Characteristics Cooperative  Mood Preoccupied  Aggressive Behavior  Effect No apparent injury  Thought Process  Coherency WDL  Content WDL  Delusions WDL  Perception WDL  Hallucination None reported or observed  Judgment WDL  Confusion None  Danger to Self  Current suicidal ideation? Denies  Danger to Others  Danger to Others None reported or observed

## 2022-01-04 NOTE — Progress Notes (Signed)
Adult Psychoeducational Group Note  Date:  01/04/2022 Time:  9:07 PM  Group Topic/Focus:  Wrap-Up Group:   The focus of this group is to help patients review their daily goal of treatment and discuss progress on daily workbooks.  Participation Level:  Active  Participation Quality:  Appropriate  Affect:  Appropriate  Cognitive:  Appropriate  Insight: Appropriate  Engagement in Group:  Improving  Modes of Intervention:  Discussion  Additional Comments:  Pt stated his goal for today was to focus on his treatment plan. Pt stated he accomplished his goal today. Pt stated he talked with his doctor and with his social worker about his care today. Pt rated his overall day a 8 out of 10. Pt stated he made no calls today. Pt stated he felt better about himself today. Pt stated he was able to attend all meals. Pt stated he took all medications provided today. Pt stated he attend all groups held today. Pt stated his appetite was pretty good today. Pt rated sleep last night was pretty good. Pt stated the goal tonight was to get some rest. Pt stated he had no physical pain tonight. Pt deny visual hallucinations and auditory issues tonight. Pt denies thoughts of harming himself or others. Pt stated he would alert staff if anything changed  Felipa Furnace 01/04/2022, 9:07 PM

## 2022-01-04 NOTE — Progress Notes (Signed)
[facility] MD Progress Note  01/04/2022 12:57 PM Mark STROUGH  MRN:  [id number]   Reason for Admission:  Mark [name] is a 61 y.o., male with a past psychiatric history significant for schizophrenia who presents to the [facility] from [facility] emergency room for evaluation and management of increased psychosis and agitation. The Mark [name] is currently on Hospital Day 8.   Chart Review from last 24 hours:  The Mark [name]'s chart was reviewed and nursing notes were reviewed. The Mark [name]'s case was discussed in multidisciplinary team meeting. Per [facility] Mark [name] is compliant with his scheduled medications in the hospital including Cogentin and Depakote Neurontin Haldol trazodone Inderal and Norvasc.  He did receive 1 dose of Ativan as needed yesterday early afternoon for increased anxiety and shakes, no as needed medication were needed or given for agitation or aggression.  Information Obtained Today During Mark [name] Interview: The Mark [name] was seen and evaluated in his room.  He presents calm with no irritability or anxiety noted.  He presents with linear responses, no disorganized thought process noted, reports good sleep and appetite.  Continues to deny SI HI or AVH he denies to me today again any incidents of talking to himself "not anymore".  He has been compliant with his medications on the unit and denies side effects with them, involuntary movement secondary to TD ongoing with slight improvement but Mark [name] notes it is not bothering him.  Blood pressure reading this morning showing elevated diastolic 94 and 97.  Mark [name] continues to participate in the milieu attending groups and going to the dining room and to the gym.    Sleep  Sleep: Fair, improved  Spoke to Mark [name]'s father/guardian on 8/29 over the phone, discussed improvement during hospital stay regarding Mark [name]'s psychosis and mood instability, discussed the current medication regimen and recommendation for follow-up after  discharge, discussed discharge plan for tomorrow afternoon and father agrees to pick Mark [name] up tomorrow at 2 PM, discussed crisis plan if any worsening psychosis or mood instability after discharge. Principal Problem: Schizophrenia ([place]) Diagnosis: Principal Problem:   Schizophrenia ([place]) Active Problems:   Tardive dyskinesia    Past Psychiatric History: Prior Psychiatric diagnoses: Schizophrenia Past Psychiatric Hospitalizations: At least twice last time February 2023 at this facility   History of self mutilation: Denies Past suicide attempts: Denies Past history of HI, violent or aggressive behavior: History of aggression towards family members as well as history of assault charges 3 weeks ago and Mark [name] reports was in prison for 2 years for armed robbery and physical assault in the past   Past Psychiatric medications trials: Depakote, clozapine, Haldol, Cogentin, gabapentin, trazodone, Vistaril with varying efficacy History of ECT/TMS: Denies   Outpatient psychiatric Follow up: Unknown but reported lack of compliance Prior Outpatient Therapy: Unknown  Past Medical History:  Past Medical History:  Diagnosis Date   COPD (chronic obstructive pulmonary disease) ([place])    Hypertension    Schizophrenia ([place])     Past Surgical History:  Procedure Laterality Date   LOWER EXTREMITY ANGIOGRAPHY N/A 09/18/2017   Procedure: LOWER EXTREMITY ANGIOGRAPHY- Recheck lysis;  Surgeon: Waynetta Sandy, MD;  Location: [place] [facility];  Service: Cardiovascular;  Laterality: N/A;   PERIPHERAL VASCULAR INTERVENTION Left 09/18/2017   Procedure: PERIPHERAL VASCULAR INTERVENTION;  Surgeon: Waynetta Sandy, MD;  Location: [place] [facility];  Service: Cardiovascular;  Laterality: Left;   THROMBECTOMY FEMORAL ARTERY Left 09/17/2017   Procedure: POSSIBLE THROMBECTOMY;  Surgeon: Waynetta Sandy, MD;  Location: [place];  Service: Vascular;  Laterality: Left;   VENOGRAM Left  09/17/2017   Procedure: ULTRASOUND POPLITEAL ACCESS; CENTRAL VENOGRAM, IVVS, LYSIS CATHETER PLACEMENT;  Surgeon: Waynetta Sandy, MD;  Location: [place];  Service: Vascular;  Laterality: Left;   Family History: History reviewed. No pertinent family history. Family Psychiatric  History: Denies any Social History: Living situation: Lives with his father, brother and cousin Social support: Limited Marital Status: Never married Children: Mark [name] reports he has 7 children 4 boys and 3 girls live with her mother in [place], all from same mother per Mark [name]'s report Education: 9 grade Employment: Unemployed, reports on disability but unable to specify since when [employer] service: Denies Legal history: Denies pending charges or court dates but reports 3 weeks ago was in jail for physical assault but no pending court dates, reports was in prison in the past for 2 years for armed robbery and assault on Engineer, structural. Trauma: Denies Access to guns: Denies  Current Medications: Current Facility-Administered Medications  Medication Dose Route Frequency Provider Last Rate Last Admin   acetaminophen (TYLENOL) tablet 650 mg  650 mg Oral Q4H PRN Vesta Mixer, NP   650 mg at 01/03/22 2043   alum & mag hydroxide-simeth (MAALOX/MYLANTA) 200-200-20 MG/5ML suspension 30 mL  30 mL Oral Q6H PRN Vesta Mixer, NP       amLODipine (NORVASC) tablet 5 mg  5 mg Oral Daily Vesta Mixer, NP   5 mg at 01/04/22 0837   benztropine (COGENTIN) tablet 1 mg  1 mg Oral BID Vesta Mixer, NP   1 mg at 01/04/22 0837   divalproex (DEPAKOTE ER) 24 hr tablet 500 mg  500 mg Oral BID Vesta Mixer, NP   500 mg at 01/04/22 [id number]   gabapentin (NEURONTIN) capsule 400 mg  400 mg Oral TID Vesta Mixer, NP   400 mg at 01/04/22 1248   haloperidol (HALDOL) tablet 10 mg  10 mg Oral BID Dian Situ, MD   10 mg at 01/04/22 [id number]   risperiDONE (RISPERDAL M-TABS) disintegrating tablet 2 mg  2 mg Oral Q8H PRN  Vesta Mixer, NP       And   LORazepam (ATIVAN) tablet 1 mg  1 mg Oral PRN Vesta Mixer, NP       And   ziprasidone (GEODON) injection 20 mg  20 mg Intramuscular PRN Vesta Mixer, NP       LORazepam (ATIVAN) tablet 1 mg  1 mg Oral Q6H PRN Winfred Leeds, Aaden Buckman, MD   1 mg at 01/02/22 1226   magnesium hydroxide (MILK OF MAGNESIA) suspension 30 mL  30 mL Oral Daily PRN Vesta Mixer, NP       ondansetron (ZOFRAN) tablet 4 mg  4 mg Oral Q8H PRN Vesta Mixer, NP       traZODone (DESYREL) tablet 150 mg  150 mg Oral QHS Torra Pala, MD   150 mg at 01/03/22 2041   valbenazine ([name]) capsule 40 mg  40 mg Oral Daily Elyanah Farino, MD   40 mg at 01/04/22 [id number]   Vitamin D (Ergocalciferol) (DRISDOL) 1.25 MG (50000 UNIT) capsule 50,000 Units  50,000 Units Oral Weekly Vesta Mixer, NP   50,000 Units at 01/02/22 [id number]    Lab Results:  No results found for this or any previous visit (from the past 48 hour(s)).   Blood Alcohol level:  Lab Results  Component Value Date   [facility] <10 12/03/2021   ETH <10 [id number]    Metabolic Disorder Labs: Lab Results  Component Value Date  HGBA1C 6.3 (H) 12/29/2021   MPG 134.11 12/29/2021   MPG 128 06/28/2021   No results found for: "PROLACTIN" Lab Results  Component Value Date   CHOL 140 12/29/2021   TRIG 134 12/29/2021   HDL 36 (L) 12/29/2021   CHOLHDL 3.9 12/29/2021   VLDL 27 12/29/2021   LDLCALC 77 12/29/2021   LDLCALC 92 07/04/2021    Physical Findings: AIMS: Facial and Oral Movements Muscles of Facial Expression: [name], normal Lips and Perioral Area: [name], normal Jaw: [name], normal Tongue: [name], normal,Extremity Movements Upper (arms, wrists, hands, fingers): Moderate Lower (legs, knees, ankles, toes): Mild, Trunk Movements Neck, shoulders, hips: Minimal, Overall Severity Severity of abnormal movements (highest score from questions above): Moderate Incapacitation due to abnormal movements: [name], normal Mark [name]'s awareness of  abnormal movements (rate only Mark [name]'s report): Aware, no distress, Dental Status Current problems with teeth and/or dentures?: No Does Mark [name] usually wear dentures?: No  CIWA:    COWS:     Musculoskeletal: Strength & Muscle Tone: within normal limits Gait & Station: normal Mark [name] leans: N/A  Psychiatric Specialty Exam:  General Appearance: Better hygiene noted, dressed in hospital scrubs, appears older than stated age Involuntary upper extremities and lower extremities movement noted mild in severity.  Behavior: Calm and cooperative  Psychomotor Activity: No agitation or retardation noted  Eye Contact: Fair Speech: Normal amount tone and volume   Mood: Euthymic Affect: Congruent, restricted  Thought Process: Linear, concrete Descriptions of Associations: Intact with much more organized and linear thought process noted Thought Content: Hallucinations: Denies AVH, does not appear responding to stimuli during evaluation Delusions: No paranoia noted Suicidal Thoughts: Denies SI, intention, plan  Homicidal Thoughts: Denies HI, intention, plan   Alertness/Orientation: Alert, partially oriented  Insight: Improved Judgment: Improved  Memory: Limited  Executive Functions  Concentration: Limited, easily distracted Attention Span: Limited Recall: Limited Fund of Knowledge: Limited  Assets  Assets:Communication Skills; Desire for Improvement; Physical Health    Physical Exam: Physical Exam Review of Systems  All other systems reviewed and are negative.  Blood pressure (!) 118/94, pulse 85, temperature 97.9 F (36.6 C), temperature source Oral, resp. rate 20, height 6' (1.829 m), weight 82.1 kg, SpO2 100 %. Body mass index is 24.55 kg/m.   Treatment Plan Summary:  ASSESSMENT:  Diagnoses / Active Problems: Principal Problem: Schizophrenia (HCC) Diagnosis: Principal Problem:   Schizophrenia (HCC) Active Problems:   Tardive dyskinesia   PLAN: Safety and  Monitoring:             -- Involuntary admission to inpatient psychiatric unit for safety, stabilization and treatment             -- Daily contact with Mark [name] to assess and evaluate symptoms and progress in treatment             -- Mark [name]'s case to be discussed in multi-disciplinary team meeting             -- Observation Level : q15 minute checks             -- Vital signs:  q12 hours             -- Precautions: suicide, elopement, and assault   2. Medications:             Continue Depakote 500 mg twice daily for mood stabilization and agitation                         Continue oral Haldol  10 mg twice daily to target better control of psychosis.  Continue Haldol decanoate injection to help with long-term compliance if effective and well-tolerated Haldol decanoate 100 mg IM injection today 8/25, second dose of Haldol decanoate injection 100 mg IM given 8/28           Continue [name] 40 mg daily for 1 week then titrate to 80 mg daily to target tardive dyskinesia             Continue Neurontin 400 mg 3 times daily for mood and anxiety                        Continue trazodone150 mg at bedtime for sleep             Titrate Norvasc from 5 mg to 10 mg daily for hypertension.             Continue vitamin D weekly for vitamin D deficiency             Risperidone, Ativan and Geodon as needed for episodic psychotic agitation               RPR reactive, titer 1:2, Mark [name] denies h/o treatment for syphilis, Mark [name] is poor historian, consulted with ID over the phone, chart and case were reviewed, T pallidum Abs negative, no need for treatment at this time, results consistent with false positive result    The risks/benefits/side-effects/alternatives to this medication were discussed in detail with the Mark [name] and time was given for questions. The Mark [name] consents to medication trial.                -- Metabolic profile and EKG monitoring obtained while on an atypical antipsychotic (BMI: Lipid  Panel: HbgA1c: QTc:)                            3. Labs Reviewed: Comprehensive metabolic panel no significant abnormalities noted, CBC no significant abnormalities noted, Depakote level 18 below therapeutic on 8/18 and 59 on 7/30, RPR reactive, T Pallidum Abs negative, titer 1:2 8/15, HIV negative 8/18, UA no significant abnormalities noted, urine drug screen -8/18, EKG 7/31 QTc 423 MRI brain 7/20 no abnormalities noted Lipid panel 8/23 within normal level except for low HDL 36, hemoglobin A1c elevated 6.3 Depakote level 12/2640 low therapeutic, LFT pending, ammonia level pending     Lab ordered:  LFT and ammonia level on 8/26 to ensure therapeutic level and no toxicity   4. Tobacco Use Disorder             -- Discontinued NicoDerm patch per Mark [name]'s request, reported increased his craving, offered Mark [name] Nicorette gum but Mark [name] refuses, will follow.             -- Smoking cessation encouraged   5. Group and Therapy: -- Encouraged Mark [name] to participate in unit milieu and in scheduled group therapies                 6. Discharge Planning:              -- Social work and case management to assist with discharge planning and identification of hospital follow-up needs prior to discharge             -- Estimated LOS: 5-7 days             -- Discharge Concerns: Need to establish a safety plan; Medication compliance and  effectiveness             -- Discharge Goals: Return home with outpatient referrals for mental health follow-up including medication management/psychotherapy  At time of discharge recommend outpatient follow-up with primary care provider for prediabetic hemoglobin A1c and hypertension to monitor and follow.   The Mark [name] is agreeable with the medication plan, as above. We will monitor the Mark [name]'s response to pharmacologic treatment, and adjust medications as necessary. Mark [name] is encouraged to participate in group therapy while admitted to the psychiatric unit. We will  address other chronic and acute stressors, which contributed to the Mark [name]'s worsening psychosis and physical aggression, in order to reduce the risk of self-harm at discharge.     Physician Treatment Plan for Primary Diagnosis: Schizophrenia (HCC) Long Term Goal(s): Improvement in symptoms so as ready for discharge   Short Term Goals: Ability to identify changes in lifestyle to reduce recurrence of condition will improve, Ability to verbalize feelings will improve, Ability to disclose and discuss suicidal ideas, Ability to demonstrate self-control will improve, Ability to identify and develop effective coping behaviors will improve, Ability to maintain clinical measurements within normal limits will improve, and Compliance with prescribed medications will improve     Total Time Spent in Direct Mark [name] Care:  I personally spent 35 minutes on the unit in direct Mark [name] care. The direct Mark [name] care time included face-to-face time with the Mark [name], reviewing the Mark [name]'s chart, communicating with other professionals, and coordinating care. Greater than 50% of this time was spent in counseling or coordinating care with the Mark [name] regarding goals of hospitalization, psycho-education, and discharge planning needs.   Yesena Reaves Abbott Pao, MD 01/04/2022, 12:57 PM

## 2022-01-04 NOTE — Progress Notes (Signed)
Patient did not attend morning orientation/goal setting group 

## 2022-01-04 NOTE — Progress Notes (Signed)
   01/04/22 0500  Sleep  Number of Hours 6.25

## 2022-01-04 NOTE — BHH Counselor (Signed)
CSW called [facility] who reports that they have stopped peer support services at this time.  They offered CST team.  They agreed to call dad (guardian) to set up an intake for services.  They agreed to call CSW tomorrow with appt date.    Auburn Hert, LCSW, LCAS Clincal Social Worker  [facility]

## 2022-01-04 NOTE — Discharge Summary (Incomplete)
Physician Discharge Summary Note  Patient:  Mark [name] is an 61 y.o., male MRN:  [id number] DOB:  06/11/1960 Patient phone:  [phone] (home)  Patient address:   [address] [place] [id number],  Total Time spent with patient: 45 minutes  Date of Admission:  12/27/2021 Date of Discharge: 01/04/2022  Reason for Admission:   Mark [name] is a 61 y.o., male with a past psychiatric history significant for schizophrenia who [facility] emergency room for evaluation and management of increased psychosis and agitation.  According to outside records, the patient presented under IVC taken by family noncompliant with medications at home has diagnoses of schizophrenia, increasingly psychotic and agitated, in emergency room he was noted to be pacing talking and laughing to himself with some mild agitation noted.  Principal Problem: Schizophrenia [facility](HCC) Discharge Diagnoses: Principal Problem:   Schizophrenia (HCC) Active Problems:   Tardive dyskinesia   Past Psychiatric History: Prior Psychiatric diagnoses: Schizophrenia Past Psychiatric Hospitalizations: At least twice last time February 2023 at this facility   History of self mutilation: Denies Past suicide attempts: Denies Past history of HI, violent or aggressive behavior: History of aggression towards family members as well as history of assault charges 3 weeks ago and patient reports was in prison for 2 years for armed robbery and physical assault in the past   Past Psychiatric medications trials: Depakote, clozapine, Haldol, Cogentin, gabapentin, trazodone, Vistaril with varying efficacy History of ECT/TMS: Denies   Outpatient psychiatric Follow up: Unknown but reported lack of compliance Prior Outpatient Therapy: Unknown  Past Medical History:  Past Medical History:  Diagnosis Date   COPD (chronic obstructive pulmonary disease) (HCC)    Hypertension     Schizophrenia (HCC)     Past Surgical History:  Procedure Laterality Date   LOWER EXTREMITY ANGIOGRAPHY N/A 09/18/2017   Procedure: LOWER EXTREMITY ANGIOGRAPHY- Recheck lysis;  Surgeon: Maeola Harmanain, Brandon Christopher, MD;  [facility];  Service: Cardiovascular;  Laterality: N/A;   PERIPHERAL VASCULAR INTERVENTION Left 09/18/2017   Procedure: PERIPHERAL VASCULAR INTERVENTION;  Surgeon: Maeola Harmanain, Brandon Christopher, MD;  [facility];  Service: Cardiovascular;  Laterality: Left;   THROMBECTOMY FEMORAL ARTERY Left 09/17/2017   Procedure: POSSIBLE THROMBECTOMY;  Surgeon: Maeola Harmanain, Brandon Christopher, MD;  [facility];  Service: Vascular;  Laterality: Left;   VENOGRAM Left 09/17/2017   Procedure: ULTRASOUND POPLITEAL ACCESS; CENTRAL VENOGRAM, IVVS, LYSIS CATHETER PLACEMENT;  Surgeon: Maeola Harmanain, Brandon Christopher, MD;  [facility];  Service: Vascular;  Laterality: Left;   Family History: History reviewed. No pertinent family history. Family Psychiatric  History: Psychiatric illness: Denies Suicide: Denies Substance Abuse: Denied Social History:  Social History   Substance and Sexual Activity  Alcohol Use No     Social History   Substance and Sexual Activity  Drug Use Never    Social History   Socioeconomic History   Marital status: Single    Spouse name: Not on file   Number of children: Not on file   Years of education: Not on file   Highest education level: Not on file  Occupational History   Not on file  Tobacco Use   Smoking status: Every Day    Packs/day: 0.50    Types: Cigarettes   Smokeless tobacco: Never  Vaping Use   Vaping Use: Never used  Substance and Sexual Activity   Alcohol use: No   Drug use: Never   Sexual activity: Not Currently  Other Topics Concern   Not on file  Social History Narrative   Not on file   Social Determinants of Health   Financial Resource Strain: Not on file  Food Insecurity: Not on file  Transportation  Needs: Not on file  Physical Activity: Not on file  Stress: Not on file  Social Connections: Not on file    Hospital Course:  During the patient's hospitalization, patient had extensive initial psychiatric evaluation, and follow-up psychiatric evaluations every day.   Psychiatric diagnoses provided upon initial assessment: Schizophrenia, chronic paranoid type   Patient's psychiatric medications were adjusted on admission: Restart Depakote 500 mg twice daily for mood stabilization and agitation             Discontinue Zyprexa for history of lack of efficacy             Restart Haldol 5 mg twice daily for psychosis, monitor efficacy and safety and titrate accordingly, consider Haldol decanoate injection to help with long-term compliance if effective and well-tolerated             Restart Cogentin 1 mg twice daily for EPS             Restart Neurontin 400 mg 3 times daily for mood and anxiety             Restart Inderal 10 mg daily for anxiety and tachycardia, also will help with hypertension             Restarted trazodone 50 mg at bedtime for sleep             Restart Norvasc 5 mg daily for hypertension, consider titrating up to 10 mg if needed             Restart vitamin D weekly for vitamin D deficiency             Risperidone, Ativan and Geodon as needed for episodic psychotic agitation   During the hospitalization, other adjustments were made to the patient's psychiatric medication regimen: Haldol was titrated up to 10 mg twice daily and patient was started on Haldol decanoate injection, received 2 injections 100 mg each 3 days apart last injection was given on 8/28 with next injection 200 mg IM due on 9/27.  Cogentin was continued 1 mg twice daily for EPS, Depakote was continued 500 mg twice daily for mood stabilization and agitation, Depakote level was obtained during hospital stay, slightly low therapeutic, gabapentin was continued 400 mg 3 times daily for mood and anxiety, Inderal was  discontinued secondary to noted bradycardia, vitamin D was continued weekly for vitamin D deficiency, trazodone was titrated up to 150 mg daily at bedtime for sleep, Norvasc was titrated from 5 mg to 10 mg daily to address high blood pressure, [name] was added at dose of 40 mg daily for TD diagnosed during this hospitalization with recommendation to titrated up on outpatient basis.   Patient's care was discussed during the interdisciplinary team meeting every day during the hospitalization.   The patient denied having side effects to prescribed psychiatric medication.   Gradually, patient started adjusting to milieu. The patient was evaluated each day by a clinical provider to ascertain response to treatment. Improvement was noted by the patient's report of decreasing symptoms, improved sleep and appetite, affect, medication tolerance, behavior, and participation in unit programming.  Patient was asked each day to complete a self inventory noting mood, mental status, pain, new symptoms, anxiety and concerns.     Symptoms  were reported as significantly decreased or resolved completely by discharge.    During hospital stay patient's involuntary movement was noted to be related more to TD, upon exam several times during evaluation he was noted to have involuntary movement upper more than lower extremities, left more than right hand, slightly improved with the start of [name] but ongoing at time of discharge.   Patient's episodes of responding to stimuli talking and laughing to himself have decreased significantly with treatment, patient's thought process was much more linear later during hospitalization and he continuously denied any SI HI or AVH, his mood was noted to be stable after first 2 days of hospitalization with no further irritability noted. He did agree that medications have been helpful during this hospitalization and agreed to comply after discharge   On day of discharge, patient was  evaluated on 01/05/2022, the patient reports that their mood is stable. The patient denied having suicidal thoughts for more than 48 hours prior to discharge.  Patient denies having homicidal thoughts.  Patient denies having auditory hallucinations. Patient denies any visual hallucinations or other symptoms of psychosis. The patient was motivated to continue taking medication with a goal of continued improvement in mental health.    Patient cont to have involuntary movement upper >lower extremities, lt more than right, was started on ingrezza during this hospital stay, recommend to titrate to 80 mg daily on OP basis.  AIMS score 8 on day of dc  The patient reports their target psychiatric symptoms of irritability, disorganized thought process and auditory hallucinations responded well to the psychiatric medications, and the patient reports overall benefit other psychiatric hospitalization. Supportive psychotherapy was provided to the patient. The patient also participated in regular group therapy while hospitalized. Coping skills, problem solving as well as relaxation therapies were also part of the unit programming.   Labs were reviewed with the patient, and abnormal results were discussed with the patient.   The patient is able to verbalize their individual safety plan to this provider.   Behavioral Events: None   Restraints: None   Groups: Attended and participated fairly after first 2 days of hospitalization   Medications Changes: As above   D/C Medications: Depakote 500 mg twice daily, Haldol 10 mg twice daily for 2 more weeks, Haldol LAI 200 mg IM monthly next dose due on 9/27, Cogentin 1 mg twice daily, gabapentin 400 mg 3 times daily, trazodone 150 mg at bedtime, [name] 40 mg daily, vitamin D weekly, Norvasc 10 mg daily  Physical Findings: AIMS: Facial and Oral Movements Muscles of Facial Expression: None, normal Lips and Perioral Area: None, normal Jaw: None, normal Tongue: None,  normal,Extremity Movements Upper (arms, wrists, hands, fingers): Moderate Lower (legs, knees, ankles, toes): Minimal, Trunk Movements Neck, shoulders, hips: None, normal, Overall Severity Severity of abnormal movements (highest score from questions above): Mild Incapacitation due to abnormal movements: Mild Patient's awareness of abnormal movements (rate only patient's report): No Awareness, Dental Status Current problems with teeth and/or dentures?: No Does patient usually wear dentures?: No  CIWA:    COWS:     Musculoskeletal: Strength & Muscle Tone: within normal limits Gait & Station: normal Patient leans: N/A   Psychiatric Specialty Exam:  General Appearance: appears at stated age, fairly dressed and groomed  Behavior: pleasant and cooperative  Psychomotor Activity:No psychomotor agitation or retardation noted   Eye Contact: good Speech: normal amount, tone, volume and latency   Mood: euthymic Affect: congruent, pleasant and interactive  Thought Process: linear, goal  directed, no circumstantial or tangential thought process noted, no racing thoughts or flight of ideas Descriptions of Associations: intact Thought Content: Hallucinations: denies AH, VH , does not appear responding to stimuli Delusions: No paranoia or other delusions noted Suicidal Thoughts: denies SI, intention, plan  Homicidal Thoughts: denies HI, intention, plan   Alertness/Orientation: alert and fully oriented  Insight: fair, improved Judgment: fair, improved  Memory: intact  Executive Functions  Concentration: intact  Attention Span: Fair Recall: intact Fund of Knowledge: fair   Assets  Assets:Communication Skills; Desire for Improvement; Physical Health   Sleep Sleep:Good, improved during hospital stay   Physical Exam:  Physical Exam Constitutional:      Appearance: Normal appearance.  HENT:     Head: Normocephalic and atraumatic.  Pulmonary:     Effort: Pulmonary effort  is normal.  Skin:    General: Skin is warm and dry.  Neurological:     General: No focal deficit present.     Mental Status: He is alert.    Review of Systems  Constitutional: Negative.   HENT: Negative.    Respiratory: Negative.    Cardiovascular: Negative.   Gastrointestinal: Negative.   Genitourinary: Negative.   Musculoskeletal: Negative.   Skin: Negative.   Neurological:  Positive for tremors.       AIMS completed   Blood pressure 122/67, pulse 60, temperature 98.2 F (36.8 C), temperature source Oral, resp. rate 20, height 6' (1.829 m), weight 82.1 kg, SpO2 100 %. Body mass index is 24.55 kg/m.   Social History   Tobacco Use  Smoking Status Every Day   Packs/day: 0.50   Types: Cigarettes  Smokeless Tobacco Never   Tobacco Cessation:  N/A, patient does not currently use tobacco products   Blood Alcohol level:  Lab Results  Component Value Date   ETH <10 12/03/2021   ETH <10 11/24/2021    Metabolic Disorder Labs:  Lab Results  Component Value Date   HGBA1C 6.3 (H) 12/29/2021   MPG 134.11 12/29/2021   MPG 128 06/28/2021   No results found for: "PROLACTIN" Lab Results  Component Value Date   CHOL 140 12/29/2021   TRIG 134 12/29/2021   HDL 36 (L) 12/29/2021   CHOLHDL 3.9 12/29/2021   VLDL 27 12/29/2021   LDLCALC 77 12/29/2021   LDLCALC 92 07/04/2021    See Psychiatric Specialty Exam and Suicide Risk Assessment completed by Attending Physician prior to discharge.  Discharge destination:  Home with father  Is patient on multiple antipsychotic therapies at discharge:  No   Has Patient had three or more failed trials of antipsychotic monotherapy by history:  No  Recommended Plan for Multiple Antipsychotic Therapies: NA  Discharge Instructions     Diet - low sodium heart healthy   Complete by: As directed    Increase activity slowly   Complete by: As directed       Allergies as of 01/05/2022   No Known Allergies      Medication List      STOP taking these medications    cloZAPine 100 MG tablet Commonly known as: CLOZARIL   cyclobenzaprine 10 MG tablet Commonly known as: FLEXERIL   hydrOXYzine 25 MG capsule Commonly known as: VISTARIL   metoprolol tartrate 50 MG tablet Commonly known as: LOPRESSOR   omeprazole 20 MG capsule Commonly known as: PRILOSEC   pantoprazole 20 MG tablet Commonly known as: PROTONIX   propranolol 10 MG tablet Commonly known as: INDERAL  TAKE these medications      Indication  amLODipine 10 MG tablet Commonly known as: NORVASC Take 1 tablet (10 mg total) by mouth daily. What changed:  medication strength how much to take  Indication: High Blood Pressure Disorder   aspirin EC 81 MG tablet Take 81 mg by mouth daily. Swallow whole.  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  Indication: Extrapyramidal Reaction caused by Medications   divalproex 500 MG 24 hr tablet Commonly known as: Depakote ER Take 1 tablet (500 mg total) by mouth 2 (two) times daily.  Indication: MIXED BIPOLAR AFFECTIVE DISORDER, Mood Stability   gabapentin 400 MG capsule Commonly known as: NEURONTIN Take 1 capsule (400 mg total) by mouth 3 (three) times daily. What changed: Another medication with the same name was removed. Continue taking this medication, and follow the directions you see here.  Indication: Social Anxiety Disorder   haloperidol 10 MG tablet Commonly known as: HALDOL Take 1 tablet (10 mg total) by mouth 2 (two) times daily. What changed:  medication strength how much to take  Indication: Psychosis   haloperidol decanoate 100 MG/ML injection Commonly known as: HALDOL DECANOATE Inject 2 mLs (200 mg total) into the muscle every 30 (thirty) days. Start taking on: February 02, 2022  Indication: Schizophrenia   traZODone 150 MG  tablet Commonly known as: DESYREL Take 1 tablet (150 mg total) by mouth at bedtime. What changed:  medication strength how much to take  Indication: Trouble Sleeping   valbenazine 40 MG capsule Commonly known as: [name] Take 1 capsule (40 mg total) by mouth daily.  Indication: Tardive Dyskinesia   Vitamin D (Ergocalciferol) 1.25 MG (50000 UNIT) Caps capsule Commonly known as: DRISDOL Take 1 capsule (50,000 Units total) by mouth once a week. Start taking on: January 09, 2022 What changed: medication strength  Indication: Vitamin D Deficiency        Follow-up Information     [facility] Follow up.   Why: Please continue to follow up with this provider for ongoing services. Contact information: [address] [place] [address] [phone]         [facility] Follow up on 01/11/2022.   Why: You have a hospital follow up appointment for therapy and medicaiton management services on  01/11/22 at 8:30 am.  This appointment will be a Virtual telehealth appointment. Contact information: [address]  [address] [place] [address] [phone]         [facility]. Call.   Why: You have been referred to this organization for [employer].  Please call to get set up with assessment intake and get connected to services. Contact information: (ph) [phone] ([phone] [address],  [place], [place] [address]        Fleet Contras, MD. Schedule an appointment as soon as possible for a visit.   Specialty: Internal Medicine Why: Please call as soon as possible to schedule a hospital follow up appointment with your primary care provider as we were unable to contact prior to discharge. Contact informationAngela Nevin [address] [place] [address] [phone]                 Discharge recommendations:   Activity: as tolerated  Diet: heart healthy  # It is recommended to the patient to continue psychiatric  medications as prescribed, after discharge  from the hospital.     # It is recommended to the patient to follow up with your outpatient psychiatric provider and PCP.   # Prescriptions provided or sent directly to preferred pharmacy at discharge. Patient agreeable to plan. Given opportunity to ask questions. Appears to feel comfortable with discharge.    # In the event of worsening symptoms, the patient is instructed to call the crisis hotline, 911 and or go to the nearest ED for appropriate evaluation and treatment of symptoms. To follow-up with primary care provider for other medical issues, concerns and or health care needs   # Patient was discharged home with a plan to follow up as noted above.  -Follow-up with outpatient primary care doctor and other specialists -for management of chronic medical disease, including:  OP follow up with PCP to monitor abnormal/prediabetic hgba1c, also for fu for hypertension     Patient agrees with D/C instructions and plan.   The patient received suicide prevention pamphlet:  Yes Belongings returned:  Clothing and Valuables  Total Time Spent in Direct Patient Care:  I personally spent 45 minutes on the unit in direct patient care. The direct patient care time included face-to-face time with the patient, reviewing the patient's chart, communicating with other professionals, and coordinating care. Greater than 50% of this time was spent in counseling or coordinating care with the patient regarding goals of hospitalization, psycho-education, and discharge planning needs.    SignedSarita Bottom, MD 01/05/2022, 11:34 AM

## 2022-01-05 NOTE — Plan of Care (Signed)
Patient was able to focus on task with minimal prompting.  However, patient only attended two group sessions.   Caroll Rancher, LRT,CTRS

## 2022-01-05 NOTE — Progress Notes (Signed)
Recreation Therapy Notes  INPATIENT RECREATION TR PLAN  Patient Details Name: Mark [name] MRN: [id number] DOB: 20-Jun-1960 Today's Date: 01/05/2022  Rec Therapy Plan Is patient appropriate for Therapeutic Recreation?: Yes Treatment times per week: about 3 days Estimated Length of Stay: 5-7 days TR Treatment/Interventions: Group participation (Comment)  Discharge Criteria Pt will be discharged from therapy if:: Discharged Treatment plan/goals/alternatives discussed and agreed upon by:: Patient/family  Discharge Summary Short term goals set: See patient care plan Short term goals met: Adequate for discharge Progress toward goals comments: Groups attended Which groups?: Other (Comment) (Team Building) Reason goals not met: Patient attended two group sessions. Therapeutic equipment acquired: N/A Reason patient discharged from therapy: Discharge from hospital Pt/family agrees with progress & goals achieved: Yes Date patient discharged from therapy: 01/05/22   Victorino Sparrow, Vickki Muff, Freddie Nghiem A 01/05/2022, 1:56 PM

## 2022-01-05 NOTE — Progress Notes (Signed)
RN met with pt and reviewed pt's discharge instructions. Pt verbalized understanding of discharge instructions and pt did not have any questions. RN reviewed and provided pt with a copy of SRA, AVS and Transition Record. RN returned pt's belongings to pt. Pt denied SI/HI/AVH and voiced no concerns. Pt was appreciative of the care pt received at [facility]. Patient discharged to the lobby without incident.  

## 2022-01-05 NOTE — Progress Notes (Signed)
   01/05/22 0500  Sleep  Number of Hours 5.5

## 2022-01-05 NOTE — Group Note (Signed)
  [facility] LCSW Group Therapy Note  Date/Time:  01/05/2022 1300-1400  Type of Therapy and Topic:  Group Therapy:  Self-Care Wheel  Participation Level:  Did Not Attend   Description of Group This process group involved patients discussing the importance of self-care in different areas of life (professional, personal, emotional, psychological, spiritual, and physical) in order to achieve healthy life balance.  The group talked about what self-care in each of those areas would constitute and then specifically listed how they want to provide themselves with improved self-care.  Therapeutic Goals Patient will learn how to break self-care down into various areas of life Patient will participate in generating ideas about healthy self-care options in each category Patients will be supportive of one another and receive support from others Patient will identify one healthy self-care activity to add to his/her life   Summary of Patient Progress:  Did not attend Therapeutic Modalities Processing Psychoeducation   Kebrina Friend LCSW 01/05/2022 1:50 PM

## 2022-01-05 NOTE — Progress Notes (Signed)
  [facility] Adult Case Management Discharge Plan :  Will you be returning to the same living situation after discharge:  Yes,  Home with Dad At discharge, do you have transportation home?: Yes,  Father plans to pick up at 2pm Do you have the ability to pay for your medications: Yes,  Insurance  Release of information consent forms completed and in the chart;  Patient's signature needed at discharge.  Patient to Follow up at:  Follow-up Information     [facility] Follow up.   Why: Please continue to follow up with this provider for ongoing services. Contact information: [address] [place] [address] [phone]         [facility] Follow up on 01/11/2022.   Why: You have a hospital follow up appointment for therapy and medicaiton management services on  01/11/22 at 8:30 am.  This appointment will be a Virtual telehealth appointment. Contact information: [address]  [address] [place] [address] [phone]         [facility]. Call.   Why: You have been referred to this organization for [employer].  Please call to get set up with assessment intake and get connected to services. Contact information: (ph) [phone] ([phone] [address]  [place], [place] [address]        Fleet Contras, MD. Schedule an appointment as soon as possible for a visit.   Specialty: Internal Medicine Why: Please call as soon as possible to schedule a hospital follow up appointment with your primary care provider as we were unable to contact prior to discharge. Contact information: [address] [place] [address] [phone]                 Next level of care provider has access to [facility] Link:no  Safety Planning and Suicide Prevention discussed: Yes,  Guardian [name]     Has patient been referred to the Quitline?: Patient refused referral  Patient has been referred for addiction treatment:  N/A  Ane Payment, LCSWA 01/05/2022, 9:49 AM

## 2022-01-05 NOTE — Group Note (Signed)
Recreation Therapy Group Note   Group Topic:Team Building  Group Date: 01/05/2022 Start Time: 1000 End Time: 1045 Facilitators: Caroll Rancher, LRT,CTRS Location: [address] Dayroom   Goal Area(s) Addresses:  Patient will effectively work with peer towards shared goal.  Patient will identify skills used to make activity successful.  Patient will identify how skills used during activity can be used to reach post d/c goals.   Group Description: Landing Pad. In teams of 3-5, patients were given 12 plastic drinking straws and an equal length of masking tape. Using the materials provided, patients were asked to build a landing pad to catch a golf ball dropped from approximately 5 feet in the air. All materials were required to be used by the team in their design. LRT facilitated post-activity discussion.   Affect/Mood: Appropriate   Participation Level: Engaged   Participation Quality: Independent   Behavior: Appropriate   Speech/Thought Process: Focused   Insight: Good   Judgement: Good   Modes of Intervention: STEM Activity   Patient Response to Interventions:  Engaged   Education Outcome:  Acknowledges education and In group clarification offered    Clinical Observations/Individualized Feedback: Pt attended and worked well with peers.  Pt made suggestions to peers.  Pt and peers put their ideas together in order to complete the activity. Pt was also rocking along to the music playing in group.    Plan: Continue to engage patient in RT group sessions 2-3x/week.   Caroll Rancher, Antonietta Jewel  01/05/2022 12:46 PM

## 2022-01-07 ENCOUNTER — Other Ambulatory Visit: Payer: Self-pay

## 2022-01-07 ENCOUNTER — Emergency Department (HOSPITAL_COMMUNITY)
Admission: EM | Admit: 2022-01-07 | Discharge: 2022-01-07 | Disposition: A | Discharge status: Home / Self Care | Payer: Medicaid Other | Attending: Emergency Medicine | Admitting: Emergency Medicine

## 2022-01-07 ENCOUNTER — Encounter (HOSPITAL_COMMUNITY): Payer: Self-pay

## 2022-01-07 DIAGNOSIS — R251 Tremor, unspecified: Secondary | ICD-10-CM | POA: Insufficient documentation

## 2022-01-07 LAB — CBC
HCT: 47.5 % (ref 39.0–52.0)
Hemoglobin: 15.2 g/dL (ref 13.0–17.0)
MCH: 28.7 pg (ref 26.0–34.0)
MCHC: 32 g/dL (ref 30.0–36.0)
MCV: 89.8 fL (ref 80.0–100.0)
Platelets: 289 10*3/uL (ref 150–400)
RBC: 5.29 MIL/uL (ref 4.22–5.81)
RDW: 13.7 % (ref 11.5–15.5)
WBC: 9.6 10*3/uL (ref 4.0–10.5)
nRBC: 0 % (ref 0.0–0.2)

## 2022-01-07 LAB — COMPREHENSIVE METABOLIC PANEL
ALT: 9 U/L (ref 0–44)
AST: 14 U/L — ABNORMAL LOW (ref 15–41)
Albumin: 3.9 g/dL (ref 3.5–5.0)
Alkaline Phosphatase: 63 U/L (ref 38–126)
Anion gap: 7 (ref 5–15)
BUN: 11 mg/dL (ref 6–20)
CO2: 25 mmol/L (ref 22–32)
Calcium: 10 mg/dL (ref 8.9–10.3)
Chloride: 107 mmol/L (ref 98–111)
Creatinine, Ser: 0.95 mg/dL (ref 0.61–1.24)
GFR, Estimated: >60 mL/min (ref >60)
Glucose, Bld: 90 mg/dL (ref 70–99)
Potassium: 4.9 mmol/L (ref 3.5–5.1)
Sodium: 139 mmol/L (ref 135–145)
Total Bilirubin: 0.6 mg/dL (ref 0.3–1.2)
Total Protein: 8 g/dL (ref 6.5–8.1)

## 2022-01-07 LAB — URINALYSIS, ROUTINE W REFLEX MICROSCOPIC
Bilirubin Urine: NEGATIVE
Glucose, UA: NEGATIVE mg/dL
Hgb urine dipstick: NEGATIVE
Ketones, ur: NEGATIVE mg/dL
Leukocytes,Ua: NEGATIVE
Nitrite: NEGATIVE
Protein, ur: NEGATIVE mg/dL
Specific Gravity, Urine: 1.01 (ref 1.005–1.030)
pH: 8 (ref 5.0–8.0)

## 2022-01-07 LAB — RAPID URINE DRUG SCREEN, HOSP PERFORMED
Amphetamines: NOT DETECTED
Barbiturates: NOT DETECTED
Benzodiazepines: NOT DETECTED
Cocaine: NOT DETECTED
Opiates: NOT DETECTED
Tetrahydrocannabinol: NOT DETECTED

## 2022-01-07 LAB — ETHANOL: Alcohol, Ethyl (B): <10 mg/dL (ref <10)

## 2022-01-07 MED ORDER — AMLODIPINE BESYLATE 5 MG PO TABS
10.0000 mg | ORAL_TABLET | Freq: Once | ORAL | Status: AC
  Administered 2022-01-07: 10 mg via ORAL
  Filled 2022-01-07: qty 2

## 2022-01-07 MED ORDER — VALBENAZINE TOSYLATE 40 MG PO CAPS
40.0000 mg | ORAL_CAPSULE | Freq: Every day | ORAL | Status: DC
  Administered 2022-01-07: 40 mg via ORAL
  Filled 2022-01-07: qty 1

## 2022-01-07 MED ORDER — GABAPENTIN 300 MG PO CAPS
400.0000 mg | ORAL_CAPSULE | Freq: Once | ORAL | Status: AC
  Administered 2022-01-07: 400 mg via ORAL
  Filled 2022-01-07: qty 1

## 2022-01-07 MED ORDER — DIVALPROEX SODIUM 500 MG PO DR TAB
500.0000 mg | DELAYED_RELEASE_TABLET | Freq: Once | ORAL | Status: AC
  Administered 2022-01-07: 500 mg via ORAL
  Filled 2022-01-07: qty 1

## 2022-01-07 MED ORDER — BENZTROPINE MESYLATE 0.5 MG PO TABS
1.0000 mg | ORAL_TABLET | Freq: Once | ORAL | Status: AC
  Administered 2022-01-07: 1 mg via ORAL
  Filled 2022-01-07: qty 2

## 2022-01-07 NOTE — ED Provider Notes (Signed)
[facility]-EMERGENCY DEPT Provider Note   CSN: [id number] Arrival date & time: 01/07/22  1246     History Chief Complaint  Patient presents with   Tremors    HPI Mark [name] is a 61 y.o. male presenting for chief complaint of tremors.  Patient has an extensive history of similar.  Patient has a history of schizophrenia with recent psychiatric admission earlier this week.  He denies fevers or chills nausea vomiting syncope shortness of breath.  Notably during the admission, tremors were a problem that followed him on a daily basis.  Noted to be partially volitional partially related to history of DTs. Ultimately, patient started on a broad medication regimen including a long-acting antipsychotic. Since he was discharged, he did not pick up any of his medication.  He does not have any suicidal homicidal ideations and does not appear to be responding to any internal stimuli.  Compared to prior admission, patient seems psychiatrically improved.  His only concern is his ongoing tremors.. Notably, his AIM S score was 8 out of 12 on day of discharge.  He does continue to have tremors.  Patient's recorded medical, surgical, social, medication list and allergies were reviewed in the Snapshot window as part of the initial history.   Review of Systems   Review of Systems  Constitutional:  Negative for chills and fever.  HENT:  Negative for ear pain and sore throat.   Eyes:  Negative for pain and visual disturbance.  Respiratory:  Negative for cough and shortness of breath.   Cardiovascular:  Negative for chest pain and palpitations.  Gastrointestinal:  Negative for abdominal pain and vomiting.  Genitourinary:  Negative for dysuria and hematuria.  Musculoskeletal:  Negative for arthralgias and back pain.  Skin:  Negative for color change and rash.  Neurological:  Positive for tremors. Negative for seizures and syncope.  All other systems reviewed and are  negative.   Physical Exam Updated Vital Signs BP (!) 158/88 (BP Location: Right Arm) Comment: Pt is actively tremoring.  Pulse 88   Temp 97.8 F (36.6 C) (Oral)   Resp 15   Ht 6' (1.829 m)   Wt 82.1 kg   SpO2 100%   BMI 24.55 kg/m  Physical Exam Vitals and nursing note reviewed.  Constitutional:      General: He is not in acute distress.    Appearance: He is well-developed.  HENT:     Head: Normocephalic and atraumatic.  Eyes:     Conjunctiva/sclera: Conjunctivae normal.  Cardiovascular:     Rate and Rhythm: Normal rate and regular rhythm.     Heart sounds: No murmur heard. Pulmonary:     Effort: Pulmonary effort is normal. No respiratory distress.     Breath sounds: Normal breath sounds.  Abdominal:     Palpations: Abdomen is soft.     Tenderness: There is no abdominal tenderness.  Musculoskeletal:        General: No swelling.     Cervical back: Neck supple.  Skin:    General: Skin is warm and dry.     Capillary Refill: Capillary refill takes less than 2 seconds.  Neurological:     Mental Status: He is alert.     Comments: Diffuse tremors.  All extremities moving.  All able to follow commands.  Resting tremor appreciated.  Rhythmic in nature.  Intermittent pauses between.  Psychiatric:        Mood and Affect: Mood normal.  ED Course/ Medical Decision Making/ A&P    Procedures Procedures   Medications Ordered in ED Medications  benztropine (COGENTIN) tablet 1 mg (1 mg Oral Given 01/07/22 1618)  divalproex (DEPAKOTE) DR tablet 500 mg (500 mg Oral Given 01/07/22 1618)  gabapentin (NEURONTIN) capsule 400 mg (400 mg Oral Given 01/07/22 1617)  amLODipine (NORVASC) tablet 10 mg (10 mg Oral Given 01/07/22 1618)    Medical Decision Making:    Mark [name] is a 61 y.o. male who presented to the ED today with acute on chronic tremors detailed above.     Patient's presentation is complicated by their history of multiple comorbid medical problems, complex  outpatient medication regimens.  Patient placed on continuous vitals and telemetry monitoring while in ED which was reviewed periodically.   Complete initial physical exam performed, notably the patient  was hemodynamically stable in no acute distress.   Patient with rhythmic diffuse jerking.   Reviewed and confirmed nursing documentation for past medical history, family history, social history.    Initial Assessment:   Patient's history of present onset and physical exam findings are most consistent with ongoing tremors.  Does not appear to be focal epilepsy based on crossing of midline with bilateral diffuse symptoms.  Does not appear to be acute withdrawal based on vital signs. This is most consistent with an acute life/limb threatening illness complicated by underlying chronic conditions.  Initial Plan:  We will plan for administration of home medication regimens as exacerbation is likely worsened by medication nonadherence.  We will reassess patient after observation in the emergency department to ensure outpatient stability.  It appears patient was deemed stable for outpatient care with a elevated movement disorder score prior it is unlikely that medication regimen will provide long-term relief for patient.  Recommended patient pick up his medications today and proceed to outpatient mental health resources as he had been instructed on day of discharge.  Patient otherwise stable for discharge with no acute indications for hospitalization, psychiatric evaluation, or any further work-up at this time.  Patient ambulatory tolerating p.o. intake stable for discharge. Discussed case with patient's legal guardian.  Disposition:  Based on the above findings, I believe patient is stable for discharge.    Patient/family educated about specific return precautions for given chief complaint and symptoms.  Patient/family educated about follow-up with PCP.     Patient/family expressed understanding of  return precautions and need for follow-up. Patient spoken to regarding all imaging and laboratory results and appropriate follow up for these results. All education provided in verbal form with additional information in written form. Time was allowed for answering of patient questions. Patient discharged.    Emergency Department Medication Summary:   Medications  benztropine (COGENTIN) tablet 1 mg (1 mg Oral Given 01/07/22 1618)  divalproex (DEPAKOTE) DR tablet 500 mg (500 mg Oral Given 01/07/22 1618)  gabapentin (NEURONTIN) capsule 400 mg (400 mg Oral Given 01/07/22 1617)  amLODipine (NORVASC) tablet 10 mg (10 mg Oral Given 01/07/22 1618)         Clinical Impression:  1. Tremor      Discharge   Final Clinical Impression(s) / ED Diagnoses Final diagnoses:  Tremor    Rx / DC Orders ED Discharge Orders     None         Glyn Ade, MD 01/07/22 2349

## 2022-01-07 NOTE — ED Triage Notes (Signed)
Per EMS- EMS was called for seizures by [employer]. [employer] was at the scene and unknown reason why they were there.  Patient reports history of tremors x 2 years, but worse in the past 2-3 days. EMS states that tremors stop with intention.    Patient was seen at [facility] on 12/23/21 for IVC. HTn, and medical clearance.

## 2022-01-07 NOTE — ED Notes (Signed)
Pt has tremors and is shaking, pulse rate is not accurate

## 2022-01-07 NOTE — ED Provider Triage Note (Signed)
Emergency Medicine Provider Triage Evaluation Note  Mark [name] , a 61 y.o. male  was evaluated in triage.  Pt complains of "I got upset". When asked about the tremors he has listed as CC he states they have been unchanged for 2 years.   No recreational drug use or etoh per pt   Per chart review   Chart review from [facility] admission 7/19 after being found wondering around naked.     father  Mark [name] [phone]    Hx of schizophrenia. Recent head CT WNLs. Recent MRI as well. Has baseline dysarthria.   Review of Systems  Positive: Tremors, "I got upset" Negative: Fever, pain  Physical Exam  BP 130/81 (BP Location: Left Arm)   Pulse 75   Temp 97.8 F (36.6 C) (Oral)   Resp 18   Ht 6' (1.829 m)   Wt 82.1 kg   SpO2 99%   BMI 24.55 kg/m  Gen:   Awake, no distress   Resp:  Normal effort  MSK:   Moves extremities without difficulty  Other:  BL hand tremors and L foot tapping. Moves all 4 extremities. Smile symmetric.   Medical Decision Making  Medically screening exam initiated at 1:10 PM.  Appropriate orders placed.  [name] was informed that the remainder of the evaluation will be completed by another provider, this initial triage assessment does not replace that evaluation, and the importance of remaining in the ED until their evaluation is complete.  [address]    Solon Augusta [place], [place] 01/07/22 1315

## 2022-01-07 NOTE — ED Notes (Signed)
Pt complaints of tremors.  Tremors are noted on assessment, however seem to stop with distraction.

## 2022-01-07 NOTE — ED Notes (Signed)
Pt ambulated to restroom and back to bed independently in NAD

## 2022-01-10 ENCOUNTER — Emergency Department (HOSPITAL_COMMUNITY)
Admission: EM | Admit: 2022-01-10 | Discharge: 2022-01-10 | Disposition: A | Discharge status: Home / Self Care | Payer: Medicaid Other | Attending: Emergency Medicine | Admitting: Emergency Medicine

## 2022-01-10 ENCOUNTER — Encounter (HOSPITAL_COMMUNITY): Payer: Self-pay | Admitting: Emergency Medicine

## 2022-01-10 ENCOUNTER — Other Ambulatory Visit: Payer: Self-pay

## 2022-01-10 DIAGNOSIS — R251 Tremor, unspecified: Secondary | ICD-10-CM | POA: Insufficient documentation

## 2022-01-10 DIAGNOSIS — I10 Essential (primary) hypertension: Secondary | ICD-10-CM | POA: Insufficient documentation

## 2022-01-10 DIAGNOSIS — J449 Chronic obstructive pulmonary disease, unspecified: Secondary | ICD-10-CM | POA: Insufficient documentation

## 2022-01-10 MED ORDER — BENZTROPINE MESYLATE 1 MG PO TABS
1.0000 mg | ORAL_TABLET | Freq: Once | ORAL | Status: AC
Start: 2022-01-10 — End: 2022-01-10
  Administered 2022-01-10: 1 mg via ORAL
  Filled 2022-01-10: qty 1

## 2022-01-10 MED ORDER — LORAZEPAM 1 MG PO TABS
1.0000 mg | ORAL_TABLET | Freq: Once | ORAL | Status: AC
  Administered 2022-01-10: 1 mg via ORAL
  Filled 2022-01-10: qty 1

## 2022-01-10 NOTE — Discharge Instructions (Signed)
Continue to take your new medications.  Follow-up with your doctor tomorrow as planned.  All of your blood work has looked normal.  Today your blood pressure looks excellent.  It will be very important to follow-up with your doctor as outpatient to find the right combination of medication to make the tremors more tolerable.

## 2022-01-10 NOTE — ED Triage Notes (Signed)
Patient BIB [facility] from home w/ tremors x 2 years. No hx of seizures. Denied ETOH use. VSS.

## 2022-01-10 NOTE — ED Provider Triage Note (Signed)
Emergency Medicine Provider Triage Evaluation Note  ISTVAN BEHAR , a 61 y.o. male  was evaluated in triage.  Pt complains of tremors.  Patient was seen for same 3 days ago, and has known history of tremors.  These are thought to be somewhat volitional, patient with underlying history of schizophrenia as well.  When patient had been seen 3 days ago he had not picked up any of the medications that he was prescribed after recent psychiatric admission.  Patient reports that he still has not picked up these medications.  Aside from tremors no other acute complaints.  Denies other complaints Denies alcohol use  Review of Systems  Positive: tremors Negative: Cp, SOB, abd pain, fever  Physical Exam  Pulse 70   Temp 97.9 F (36.6 C) (Oral)   SpO2 96%  Gen:   Awake, no distress  Resp:  Normal effort  MSK:   Moves extremities without difficulty  Other:  Tremors seem to be exacerbated when provider is in the room, when patient goes to make an intentional movement tremor stop  Medical Decision Making  Medically screening exam initiated at 11:49 AM.  Appropriate orders placed.  [name] was informed that the remainder of the evaluation will be completed by another provider, this initial triage assessment does not replace that evaluation, and the importance of remaining in the ED until their evaluation is complete.  Seen for the same 3 days ago and given her medications, still has not picked this up.  Likely needs dose of home medications and may need to have medications represcribed.   Dartha Lodge, [place] 01/10/22 1157

## 2022-01-10 NOTE — ED Provider Notes (Signed)
[facility] EMERGENCY DEPARTMENT Provider Note   CSN: [id number] Arrival date & time: 01/10/22  1048     History  Chief Complaint  Patient presents with   Tremors    Mark [name] is a 61 y.o. male.  Patient is a 61 year old male with a history of schizophrenia, hypertension, COPD who has had years of noted tremor which is started to be tardive dyskinesia but also somewhat volitional who has been seen multiple times in the emergency room and recent psychiatric hospitalization returning today with complaint of tremor.  Discussed with the patient if he had picked up his medications from when he was last seen and since his last hospitalization he reports no but did report that he took some medications today.  It is unclear what he took today but reports he came to the ER today because he felt like the tremors were worse.  While sleeping in the bed patient is not shaking at all is not until he is woken up that he starts shaking in his arms as well as some twitching of his face.  He denies using alcohol, recent illness or change in appetite.  The history is provided by the patient and medical records.       Home Medications Prior to Admission medications   Medication Sig Start Date End Date Taking? Authorizing Provider  amLODipine (NORVASC) 10 MG tablet Take 1 tablet (10 mg total) by mouth daily. 01/05/22   Sarita Bottom, MD  aspirin EC 81 MG tablet Take 81 mg by mouth daily. Swallow whole.    [provider]  benztropine (COGENTIN) 1 MG tablet Take 1 tablet (1 mg total) by mouth 2 (two) times daily. 01/04/22 02/03/22  Sarita Bottom, MD  divalproex (DEPAKOTE ER) 500 MG 24 hr tablet Take 1 tablet (500 mg total) by mouth 2 (two) times daily. 01/04/22 02/03/22  Sarita Bottom, MD  gabapentin (NEURONTIN) 400 MG capsule Take 1 capsule (400 mg total) by mouth 3 (three) times daily. 01/04/22   Sarita Bottom, MD  haloperidol (HALDOL) 10 MG tablet Take 1 tablet (10 mg total)  by mouth 2 (two) times daily. 01/04/22   Sarita Bottom, MD  haloperidol decanoate (HALDOL DECANOATE) 100 MG/ML injection Inject 2 mLs (200 mg total) into the muscle every 30 (thirty) days. 02/02/22   Sarita Bottom, MD  traZODone (DESYREL) 150 MG tablet Take 1 tablet (150 mg total) by mouth at bedtime. 01/04/22   Sarita Bottom, MD  valbenazine ([name]) 40 MG capsule Take 1 capsule (40 mg total) by mouth daily. 01/05/22   Sarita Bottom, MD  Vitamin D, Ergocalciferol, (DRISDOL) 1.25 MG (50000 UNIT) CAPS capsule Take 1 capsule (50,000 Units total) by mouth once a week. 01/09/22   Sarita Bottom, MD      Allergies    Patient has no known allergies.    Review of Systems   Review of Systems  Physical Exam Updated Vital Signs BP 138/77 (BP Location: Right Arm)   Pulse 69   Temp 97.7 F (36.5 C) (Oral)   Resp 18   SpO2 100%  Physical Exam Vitals and nursing note reviewed.  Constitutional:      General: He is not in acute distress.    Appearance: He is well-developed.     Comments: sleeping  HENT:     Head: Normocephalic and atraumatic.  Eyes:     Conjunctiva/sclera: Conjunctivae normal.     Pupils: Pupils are equal, round, and reactive to light.  Cardiovascular:  Rate and Rhythm: Normal rate and regular rhythm.     Heart sounds: No murmur heard. Pulmonary:     Effort: Pulmonary effort is normal. No respiratory distress.     Breath sounds: Normal breath sounds. No wheezing or rales.  Abdominal:     General: There is no distension.     Palpations: Abdomen is soft.     Tenderness: There is no abdominal tenderness. There is no guarding or rebound.  Musculoskeletal:        General: No tenderness. Normal range of motion.     Cervical back: Normal range of motion and neck supple.  Skin:    General: Skin is warm and dry.     Findings: No erythema or rash.  Neurological:     Mental Status: He is oriented to person, place, and time.     Comments: While sleeping in the bed patient is not  shaking at all does not till he wakes up that he starts shaking in his upper extremities and his face  Psychiatric:     Comments: Calm and cooperative     ED Results / Procedures / Treatments   Labs (all labs ordered are listed, but only abnormal results are displayed) Labs Reviewed - No data to display  EKG None  Radiology No results found.  Procedures Procedures    Medications Ordered in ED Medications  benztropine (COGENTIN) tablet 1 mg (has no administration in time range)  LORazepam (ATIVAN) tablet 1 mg (1 mg Oral Given 01/10/22 1253)    ED Course/ Medical Decision Making/ A&P                           Medical Decision Making Amount and/or Complexity of Data Reviewed Independent Historian: guardian External Data Reviewed: notes.    Details: Recent psychiatric stay  Risk Prescription drug management.   Patient returning today complaining of tremors.  Patient has been evaluated for this multiple times and per the patient he has not picked up his medications yet.  Patient has had recent psychiatric evaluation and new medications recommended.  He was seen in the emergency room 3 days ago and at that time had labs which all were reassuring including a Depakote level that was not toxic and stable electrolytes.  Patient reports he has been eating and drinking normally.  He is well-appearing on exam and has normal vital signs.  Patient was given a dose of Ativan and Cogentin as feel that this is most likely has a behavioral component. 1:24 PM With the patient's legal guardian Mark [name] who reports that patient did receive his medications from the pharmacy after his psychiatric discharge and he has been taking them but they have not noticed that it is helped much with his tremors.  Discussed with him that lab work on his recent evaluation was normal and vital signs are normal today and did not feel that patient will need a significant work-up in the emergency room.  Did  encourage follow-up with PCP tomorrow as planned and it may take time to get the tremors under control.  He is comfortable with this plan.  Patient is well-appearing and feel that he is stable for discharge today.        Final Clinical Impression(s) / ED Diagnoses Final diagnoses:  Tremor    Rx / DC Orders ED Discharge Orders     None         Gwyneth Sprout, MD 01/10/22  1324  

## 2022-01-10 NOTE — ED Notes (Signed)
Pt's legal guardian here at this time. Discharge orders reviewed and education provided. All questions answered. Pt states he feels like he's been treated "like a king." Instructed pt to give paperwork to legal guardian Mark [name]. No further needs. Pt went by wheelchair to exit in stable condition with all belongings

## 2022-01-10 NOTE — ED Notes (Signed)
Called and left message to confirm ride. Left call back number. Provider spoke with pt's legal guardian earlier and stated patient will be D/C'd, pt's guardian will come after church event

## 2022-01-12 ENCOUNTER — Other Ambulatory Visit: Payer: Self-pay

## 2022-01-12 ENCOUNTER — Encounter (HOSPITAL_COMMUNITY): Payer: Self-pay | Admitting: Emergency Medicine

## 2022-01-12 ENCOUNTER — Emergency Department (HOSPITAL_COMMUNITY)
Admission: EM | Admit: 2022-01-12 | Discharge: 2022-01-13 | Disposition: A | Discharge status: Home / Self Care | Payer: Medicaid Other | Attending: Emergency Medicine | Admitting: Emergency Medicine

## 2022-01-12 DIAGNOSIS — Z7982 Long term (current) use of aspirin: Secondary | ICD-10-CM | POA: Insufficient documentation

## 2022-01-12 DIAGNOSIS — I10 Essential (primary) hypertension: Secondary | ICD-10-CM | POA: Diagnosis not present

## 2022-01-12 DIAGNOSIS — J449 Chronic obstructive pulmonary disease, unspecified: Secondary | ICD-10-CM | POA: Insufficient documentation

## 2022-01-12 DIAGNOSIS — R251 Tremor, unspecified: Secondary | ICD-10-CM | POA: Insufficient documentation

## 2022-01-12 NOTE — ED Triage Notes (Signed)
Pt brought to ED by [facility] for evaluation of tremors. Has been evaluated multiple times for the same, has hx of schizophrenia by not behavioral complaints at this time. Denies HI/SI.

## 2022-01-12 NOTE — ED Triage Notes (Addendum)
Vital signs not obtained at this time. Pt noted to have severe increase in "tremors' while RN attempting to obtain readings, causing inaccurate numbers to register. Pt also noted to take blood pressure cuff off and through it across the room stating "its too tight" and pt noted to halt tremors while doing so.

## 2022-01-12 NOTE — ED Provider Triage Note (Signed)
Emergency Medicine Provider Triage Evaluation Note  ERBY SANDERSON , a 61 y.o. male  was evaluated in triage.  Pt complains of continued tremors.  He has questions about his medications and is refusing vitals.  Review of Systems  Positive: Tremors Negative: Fever  Physical Exam  There were no vitals taken for this visit. Gen:   Awake, no distress   Resp:  Normal effort  MSK:   Moves extremities without difficulty  Other:    Medical Decision Making  Medically screening exam initiated at 7:31 PM.  Appropriate orders placed.  [name] was informed that the remainder of the evaluation will be completed by another provider, this initial triage assessment does not replace that evaluation, and the importance of remaining in the ED until their evaluation is complete.  Patient is resistant to answer questions, vital signs, etc.  Will seat in waiting room until he is called for room placement.   Solon Augusta [place], [place] 01/12/22 1932

## 2022-01-13 MED ORDER — LORAZEPAM 1 MG PO TABS
1.0000 mg | ORAL_TABLET | Freq: Once | ORAL | Status: AC
  Administered 2022-01-13: 1 mg via ORAL
  Filled 2022-01-13: qty 1

## 2022-01-13 MED ORDER — BENZTROPINE MESYLATE 1 MG/ML IJ SOLN
1.0000 mg | Freq: Once | INTRAMUSCULAR | Status: AC
  Administered 2022-01-13: 1 mg via INTRAMUSCULAR
  Filled 2022-01-13: qty 1

## 2022-01-13 NOTE — ED Provider Notes (Signed)
[facility] EMERGENCY DEPARTMENT Provider Note   CSN: [id number] Arrival date & time: 01/12/22  1910     History  Chief Complaint  Patient presents with   Tremors    Mark [name] is a 61 y.o. male with a history of schizophrenia, hypertension, COPD and tar dive dyskinesia presenting today with tremors.  Reports that this has been going on for a long time but it is not getting better.  He reports that yesterday he started to take the medication he was discharged on but does not feel it is helping.  HPI     Home Medications Prior to Admission medications   Medication Sig Start Date End Date Taking? Authorizing Provider  amLODipine (NORVASC) 10 MG tablet Take 1 tablet (10 mg total) by mouth daily. 01/05/22   Sarita Bottom, MD  aspirin EC 81 MG tablet Take 81 mg by mouth daily. Swallow whole.    [provider]  benztropine (COGENTIN) 1 MG tablet Take 1 tablet (1 mg total) by mouth 2 (two) times daily. 01/04/22 02/03/22  Sarita Bottom, MD  divalproex (DEPAKOTE ER) 500 MG 24 hr tablet Take 1 tablet (500 mg total) by mouth 2 (two) times daily. 01/04/22 02/03/22  Sarita Bottom, MD  gabapentin (NEURONTIN) 400 MG capsule Take 1 capsule (400 mg total) by mouth 3 (three) times daily. 01/04/22   Sarita Bottom, MD  haloperidol (HALDOL) 10 MG tablet Take 1 tablet (10 mg total) by mouth 2 (two) times daily. 01/04/22   Sarita Bottom, MD  haloperidol decanoate (HALDOL DECANOATE) 100 MG/ML injection Inject 2 mLs (200 mg total) into the muscle every 30 (thirty) days. 02/02/22   Sarita Bottom, MD  traZODone (DESYREL) 150 MG tablet Take 1 tablet (150 mg total) by mouth at bedtime. 01/04/22   Sarita Bottom, MD  valbenazine ([name]) 40 MG capsule Take 1 capsule (40 mg total) by mouth daily. 01/05/22   Sarita Bottom, MD  Vitamin D, Ergocalciferol, (DRISDOL) 1.25 MG (50000 UNIT) CAPS capsule Take 1 capsule (50,000 Units total) by mouth once a week. 01/09/22   Sarita Bottom, MD       Allergies    Patient has no known allergies.    Review of Systems   Review of Systems  Physical Exam Updated Vital Signs BP (!) 151/51 (BP Location: Left Arm)   Pulse 92   Temp 98.1 F (36.7 C) (Oral)   Resp 19   SpO2 99%  Physical Exam Vitals and nursing note reviewed.  Constitutional:      Appearance: Normal appearance.  HENT:     Head: Normocephalic and atraumatic.  Eyes:     General: No scleral icterus.    Conjunctiva/sclera: Conjunctivae normal.  Pulmonary:     Effort: Pulmonary effort is normal. No respiratory distress.  Musculoskeletal:     Comments: Patient initially resting comfortably on the stretcher.  When I woke him up he started to shake in bilateral upper and lower extremities.  Appeared voluntary  Skin:    Findings: No rash.  Neurological:     Mental Status: He is alert.  Psychiatric:        Mood and Affect: Mood normal.     ED Results / Procedures / Treatments   Labs (all labs ordered are listed, but only abnormal results are displayed) Labs Reviewed - No data to display  EKG None  Radiology No results found.  Procedures Procedures   Medications Ordered in ED Medications  LORazepam (ATIVAN) tablet 1 mg (has  no administration in time range)  benztropine mesylate (COGENTIN) injection 1 mg (has no administration in time range)    ED Course/ Medical Decision Making/ A&P                           Medical Decision Making Risk Prescription drug management.   61 year old male presenting due to tremors.  Has had a numerous amount of visits for this over the past months.  He was seen 3 days ago for this and reportedly had a follow-up with PCP however this was never attended.  He reports that he started to take the medications he was discharged on (Depakote, Cogentin and Haldol) but that they are not helping.  Physical exam: No tremors noted while patient is asleep.  When he is awoken he begins to shake in bilateral upper and lower  extremities.  This appears voluntary.  Treatment: Given Ativan and Cogentin  MDM/disposition: Patient is a 61 year old male with a past medical history of tar dive dyskinesia presenting today due to tremors.  He has presented similarly multiple times in the past few weeks.  He denies any history of alcohol use, do not believe this is consistent with DTs.  Lab work performed 6 days ago was negative.  I do not see any use in repeating this today.  Ultimately, he needs to follow-up with PCP about his recurring tremors.  Additionally, he has only been taking the medication for 24 hours and I believe he needs to continue to be compliant with the regimen to fully know whether or not it is going to help him.  This was explained to the patient.  I tried to call his legal guardian who did not answer the phone at this time.  Patient is safe and stable for discharge   Final Clinical Impression(s) / ED Diagnoses Final diagnoses:  Tremor    Rx / DC Orders ED Discharge Orders     None      Results and diagnoses were explained to the patient. Return precautions discussed in full. Patient had no additional questions and expressed complete understanding.   This chart was dictated using voice recognition software.  Despite best efforts to proofread,  errors can occur which can change the documentation meaning.     Woodroe Chen 01/13/22 0615    Zadie Rhine, MD 01/13/22 [phone]

## 2022-01-13 NOTE — Discharge Instructions (Addendum)
Please continue to take the medications that were prescribed to you.  You need to take them for longer than a couple of days to know whether or not they will fully help you.  More importantly, you need to follow-up with your PCP or psychiatrist about the symptoms.  There is not much more to be done from the emergency department standpoint.

## 2022-01-13 NOTE — ED Notes (Signed)
Unable to get vitals d/t tremors

## 2022-01-13 NOTE — ED Notes (Signed)
Patient seen walking outside with steady gait and no tremors

## 2022-01-17 ENCOUNTER — Other Ambulatory Visit: Payer: Self-pay

## 2022-01-17 ENCOUNTER — Encounter (HOSPITAL_COMMUNITY): Payer: Self-pay

## 2022-01-17 ENCOUNTER — Emergency Department (HOSPITAL_COMMUNITY)
Admission: EM | Admit: 2022-01-17 | Discharge: 2022-01-18 | Disposition: A | Discharge status: Home / Self Care | Payer: Medicaid Other | Attending: Emergency Medicine | Admitting: Emergency Medicine

## 2022-01-17 DIAGNOSIS — E8809 Other disorders of plasma-protein metabolism, not elsewhere classified: Secondary | ICD-10-CM | POA: Diagnosis not present

## 2022-01-17 DIAGNOSIS — J449 Chronic obstructive pulmonary disease, unspecified: Secondary | ICD-10-CM | POA: Insufficient documentation

## 2022-01-17 DIAGNOSIS — R251 Tremor, unspecified: Secondary | ICD-10-CM | POA: Insufficient documentation

## 2022-01-17 DIAGNOSIS — I10 Essential (primary) hypertension: Secondary | ICD-10-CM | POA: Insufficient documentation

## 2022-01-17 DIAGNOSIS — Z7982 Long term (current) use of aspirin: Secondary | ICD-10-CM | POA: Diagnosis not present

## 2022-01-17 DIAGNOSIS — Z79899 Other long term (current) drug therapy: Secondary | ICD-10-CM | POA: Diagnosis not present

## 2022-01-17 LAB — CBC WITH DIFFERENTIAL/PLATELET
Abs Immature Granulocytes: 0.02 10*3/uL (ref 0.00–0.07)
Basophils Absolute: 0.1 10*3/uL (ref 0.0–0.1)
Basophils Relative: 1 %
Eosinophils Absolute: 0.4 10*3/uL (ref 0.0–0.5)
Eosinophils Relative: 4 %
HCT: 46.6 % (ref 39.0–52.0)
Hemoglobin: 15.2 g/dL (ref 13.0–17.0)
Immature Granulocytes: 0 %
Lymphocytes Relative: 36 %
Lymphs Abs: 3.3 10*3/uL (ref 0.7–4.0)
MCH: 29 pg (ref 26.0–34.0)
MCHC: 32.6 g/dL (ref 30.0–36.0)
MCV: 88.9 fL (ref 80.0–100.0)
Monocytes Absolute: 0.8 10*3/uL (ref 0.1–1.0)
Monocytes Relative: 9 %
Neutro Abs: 4.5 10*3/uL (ref 1.7–7.7)
Neutrophils Relative %: 50 %
Platelets: 246 10*3/uL (ref 150–400)
RBC: 5.24 MIL/uL (ref 4.22–5.81)
RDW: 13.8 % (ref 11.5–15.5)
WBC: 9.1 10*3/uL (ref 4.0–10.5)
nRBC: 0 % (ref 0.0–0.2)

## 2022-01-17 LAB — COMPREHENSIVE METABOLIC PANEL
ALT: 9 U/L (ref 0–44)
AST: 13 U/L — ABNORMAL LOW (ref 15–41)
Albumin: 4.1 g/dL (ref 3.5–5.0)
Alkaline Phosphatase: 63 U/L (ref 38–126)
Anion gap: 6 (ref 5–15)
BUN: 13 mg/dL (ref 6–20)
CO2: 26 mmol/L (ref 22–32)
Calcium: 10.3 mg/dL (ref 8.9–10.3)
Chloride: 110 mmol/L (ref 98–111)
Creatinine, Ser: 1.18 mg/dL (ref 0.61–1.24)
GFR, Estimated: >60 mL/min (ref >60)
Glucose, Bld: 95 mg/dL (ref 70–99)
Potassium: 4.3 mmol/L (ref 3.5–5.1)
Sodium: 142 mmol/L (ref 135–145)
Total Bilirubin: 0.4 mg/dL (ref 0.3–1.2)
Total Protein: 8.5 g/dL — ABNORMAL HIGH (ref 6.5–8.1)

## 2022-01-17 LAB — VALPROIC ACID LEVEL: Valproic Acid Lvl: 56 ug/mL (ref 50.0–100.0)

## 2022-01-17 LAB — ETHANOL: Alcohol, Ethyl (B): <10 mg/dL (ref <10)

## 2022-01-17 LAB — AMMONIA: Ammonia: 44 umol/L — ABNORMAL HIGH (ref 9–35)

## 2022-01-17 MED ORDER — BENZTROPINE MESYLATE 0.5 MG PO TABS
1.0000 mg | ORAL_TABLET | Freq: Once | ORAL | Status: AC
  Administered 2022-01-17: 1 mg via ORAL
  Filled 2022-01-17: qty 2

## 2022-01-17 MED ORDER — SODIUM CHLORIDE 0.9 % IV BOLUS
1000.0000 mL | Freq: Once | INTRAVENOUS | Status: AC
  Administered 2022-01-17: 1000 mL via INTRAVENOUS

## 2022-01-17 MED ORDER — LORAZEPAM 2 MG/ML IJ SOLN
1.0000 mg | Freq: Once | INTRAMUSCULAR | Status: AC
  Administered 2022-01-17: 1 mg via INTRAVENOUS
  Filled 2022-01-17 (×2): qty 1

## 2022-01-17 NOTE — ED Triage Notes (Signed)
Pt reports increase in "tremors and shaking" since last visit to ED. Pt reports taking all meds prescribed and not f/u with neuro yet. Pt wasn't shaking until nurse and PA walk in room.

## 2022-01-17 NOTE — ED Notes (Signed)
Pt is unable to remain still for bp

## 2022-01-17 NOTE — ED Provider Notes (Signed)
[facility]-EMERGENCY DEPT Provider Note   CSN: [id number] Arrival date & time: 01/17/22  1854     History {Add pertinent medical, surgical, social history, OB history to HPI:1} Chief Complaint  Patient presents with   Tremors    Mark [name] is a 61 y.o. male with a history of hypertension, COPD, schizophrenia, bipolar disorder, and tardive dyskinesia who returns to the ED for continued tremors. Patient reports generalized tremors to the extremities x 4 that have been occurring for 6 months to 1 year. Reports they occur intermittently, no alleviating/aggravating factors. Missed his meds this AM but has otherwise been taking them as prescribed. He has not seen neurology. He denies fever, chills, cough, dyspnea, chest pain, N/V/D, abdominal pain,  vision change, numbness, weakness, SI or HI. Has had some decreased PO intake.   HPI     Home Medications Prior to Admission medications   Medication Sig Start Date End Date Taking? Authorizing Provider  amLODipine (NORVASC) 10 MG tablet Take 1 tablet (10 mg total) by mouth daily. 01/05/22   Sarita Bottom, MD  aspirin EC 81 MG tablet Take 81 mg by mouth daily. Swallow whole.    [provider]  benztropine (COGENTIN) 1 MG tablet Take 1 tablet (1 mg total) by mouth 2 (two) times daily. 01/04/22 02/03/22  Sarita Bottom, MD  divalproex (DEPAKOTE ER) 500 MG 24 hr tablet Take 1 tablet (500 mg total) by mouth 2 (two) times daily. 01/04/22 02/03/22  Sarita Bottom, MD  gabapentin (NEURONTIN) 400 MG capsule Take 1 capsule (400 mg total) by mouth 3 (three) times daily. 01/04/22   Sarita Bottom, MD  haloperidol (HALDOL) 10 MG tablet Take 1 tablet (10 mg total) by mouth 2 (two) times daily. 01/04/22   Sarita Bottom, MD  haloperidol decanoate (HALDOL DECANOATE) 100 MG/ML injection Inject 2 mLs (200 mg total) into the muscle every 30 (thirty) days. 02/02/22   Sarita Bottom, MD  traZODone (DESYREL) 150 MG tablet Take 1 tablet (150  mg total) by mouth at bedtime. 01/04/22   Sarita Bottom, MD  valbenazine ([name]) 40 MG capsule Take 1 capsule (40 mg total) by mouth daily. 01/05/22   Sarita Bottom, MD  Vitamin D, Ergocalciferol, (DRISDOL) 1.25 MG (50000 UNIT) CAPS capsule Take 1 capsule (50,000 Units total) by mouth once a week. 01/09/22   Sarita Bottom, MD      Allergies    Patient has no known allergies.    Review of Systems   Review of Systems  Constitutional:  Negative for chills and fever.  Eyes:  Negative for visual disturbance.  Respiratory:  Negative for shortness of breath.   Cardiovascular:  Negative for chest pain.  Gastrointestinal:  Negative for abdominal pain, diarrhea and vomiting.  Neurological:  Positive for tremors. Negative for syncope, weakness, numbness and headaches.  Psychiatric/Behavioral:  Negative for hallucinations and suicidal ideas.   All other systems reviewed and are negative.   Physical Exam Updated Vital Signs Pulse (!) 106   Temp (!) 97.5 F (36.4 C) (Oral)   Resp 20   Ht 6' (1.829 m)   Wt 82 kg   SpO2 96%   BMI 24.52 kg/m  Physical Exam Vitals and nursing note reviewed.  Constitutional:      General: He is not in acute distress.    Appearance: He is well-developed. He is not toxic-appearing.  HENT:     Head: Normocephalic and atraumatic.  Eyes:     General:  Right eye: No discharge.        Left eye: No discharge.     Conjunctiva/sclera: Conjunctivae normal.  Cardiovascular:     Rate and Rhythm: Normal rate and regular rhythm.  Pulmonary:     Effort: No respiratory distress.     Breath sounds: Normal breath sounds. No wheezing or rales.  Abdominal:     General: There is no distension.     Palpations: Abdomen is soft.     Tenderness: There is no abdominal tenderness.  Musculoskeletal:     Cervical back: Neck supple.  Skin:    General: Skin is warm and dry.  Neurological:     Mental Status: He is alert.     Comments: Clear speech. Sensation grossly  intact to bilateral upper/lower extremities.  5 out of 5 strength with grip strength, elbow flexion/extension, knee flexion/extension, and ankle plantar/dorsiflexion bilaterally.  Patient has intermittent shaking/tremulous activity of the extremities x4, resolves with intentional activities such as strength testing.   Psychiatric:        Behavior: Behavior normal.     ED Results / Procedures / Treatments   Labs (all labs ordered are listed, but only abnormal results are displayed) Labs Reviewed  CBC WITH DIFFERENTIAL/PLATELET  VALPROIC ACID LEVEL  AMMONIA  COMPREHENSIVE METABOLIC PANEL  ETHANOL    EKG None  Radiology No results found.  Procedures Procedures  {Document cardiac monitor, telemetry assessment procedure when appropriate:1}  Medications Ordered in ED Medications  sodium chloride 0.9 % bolus 1,000 mL (has no administration in time range)  LORazepam (ATIVAN) injection 1 mg (has no administration in time range)  benztropine (COGENTIN) tablet 1 mg (has no administration in time range)    ED Course/ Medical Decision Making/ A&P                           Medical Decision Making Risk Prescription drug management.   Patient presents to the ED with complaints of ***, this involves an extensive number of treatment options, and is a complaint that carries with it a high risk of complications and morbidity. Nontoxic, vitals ***.   Ddx including but not limited to: ***  Additional history obtained:  Chart/nursing notes reviewed***.  External records viewed including: ***  EKG: ***  Lab Tests:  I viewed & interpreted labs including:  ***  Imaging Studies:  I ordered and viewed the following imaging, agree with radiologist impression:  ***  ED Course:  I ordered medications including *** for ***  ***: RE-EVAL: ***   Based on patient's chief complaint, I considered admission might be necessary, however after reassuring ED workup feel patient is reasonable  for discharge.   Critical Interventions: ***  Social determinants: *** Social determinants of health: this may be used for patients who have homelessness, poor access to medical care, need assistance with transition of care for placement, medication or transportation or follow up. You may also use the section for patients who struggle with substance abuse, have dementia, nursing home patients, patients who are severely disabled or have some degree of mental retardation and are not able to fully participate in their care.   Portions of this note were generated with Scientist, clinical (histocompatibility and immunogenetics). Dictation errors may occur despite best attempts at proofreading.   {Document critical care time when appropriate:1} {Document review of labs and clinical decision tools ie heart score, Chads2Vasc2 etc:1}  {Document your independent review of radiology images, and any outside records:1} {  Document your discussion with family members, caretakers, and with consultants:1} {Document social determinants of health affecting pt's care:1} {Document your decision making why or why not admission, treatments were needed:1} Final Clinical Impression(s) / ED Diagnoses Final diagnoses:  None    Rx / DC Orders ED Discharge Orders     None

## 2022-01-17 NOTE — ED Provider Triage Note (Signed)
Emergency Medicine Provider Triage Evaluation Note  Mark [name] , a 61 y.o. male  was evaluated in triage.  He complains of full body shaking.  Patient is evaluated so frequently in the ED for tremors.   Review of Systems  Positive: Tremors Negative:   Physical Exam  There were no vitals taken for this visit. Gen:   Awake, no distress   Resp:  Normal effort  MSK:   Moves extremities without difficulty  Other:  Ambulated with a cane to the triage room.  No tremors noted.  While patient sitting in the exam room he is shaking bilateral upper and bilateral lower extremities.  Medical Decision Making  Medically screening exam initiated at 7:34 PM.  Appropriate orders placed.  Mark [name] was informed that the remainder of the evaluation will be completed by another provider, this initial triage assessment does not replace that evaluation, and the importance of remaining in the ED until their evaluation is complete.     Similar presentations as the past.  Did not get lab work done when I saw him last week, so labs were ordered at this time.   Mark [name], [place] 01/17/22 1954

## 2022-01-18 NOTE — Discharge Instructions (Addendum)
You were seen in the emergency department today for ongoing tremors.  Your labs were overall reassuring, your protein and ammonia levels are very mildly elevated, have these rechecked by your primary care provider, however there were no significant laboratory abnormalities.  Your blood pressure was also somewhat elevated, have this rechecked as well.  Please continue your at home medication regimen.  We would like you to follow-up with a neurologist as well as psychiatry.  We have sent a referral for neurology, they will call you to schedule this appointment, however we have provided their contact information.  Return to the emergency department for any new or worsening symptoms or any other concerns.

## 2022-02-09 ENCOUNTER — Encounter (HOSPITAL_COMMUNITY): Payer: Self-pay | Admitting: Emergency Medicine

## 2022-02-09 ENCOUNTER — Other Ambulatory Visit: Payer: Self-pay

## 2022-02-09 ENCOUNTER — Emergency Department (HOSPITAL_COMMUNITY)
Admission: EM | Admit: 2022-02-09 | Discharge: 2022-02-10 | Disposition: A | Discharge status: Home / Self Care | Payer: Medicaid Other | Attending: Emergency Medicine | Admitting: Emergency Medicine

## 2022-02-09 DIAGNOSIS — Z79899 Other long term (current) drug therapy: Secondary | ICD-10-CM | POA: Diagnosis not present

## 2022-02-09 DIAGNOSIS — J449 Chronic obstructive pulmonary disease, unspecified: Secondary | ICD-10-CM | POA: Diagnosis not present

## 2022-02-09 DIAGNOSIS — I10 Essential (primary) hypertension: Secondary | ICD-10-CM | POA: Diagnosis not present

## 2022-02-09 DIAGNOSIS — R251 Tremor, unspecified: Secondary | ICD-10-CM | POA: Diagnosis present

## 2022-02-09 DIAGNOSIS — Z7982 Long term (current) use of aspirin: Secondary | ICD-10-CM | POA: Insufficient documentation

## 2022-02-09 LAB — COMPREHENSIVE METABOLIC PANEL
ALT: 11 U/L (ref 0–44)
AST: 15 U/L (ref 15–41)
Albumin: 3.8 g/dL (ref 3.5–5.0)
Alkaline Phosphatase: 59 U/L (ref 38–126)
Anion gap: 7 (ref 5–15)
BUN: 14 mg/dL (ref 6–20)
CO2: 26 mmol/L (ref 22–32)
Calcium: 10.1 mg/dL (ref 8.9–10.3)
Chloride: 109 mmol/L (ref 98–111)
Creatinine, Ser: 1.04 mg/dL (ref 0.61–1.24)
GFR, Estimated: >60 mL/min (ref >60)
Glucose, Bld: 95 mg/dL (ref 70–99)
Potassium: 4.1 mmol/L (ref 3.5–5.1)
Sodium: 142 mmol/L (ref 135–145)
Total Bilirubin: 0.3 mg/dL (ref 0.3–1.2)
Total Protein: 7.4 g/dL (ref 6.5–8.1)

## 2022-02-09 LAB — CBC WITH DIFFERENTIAL/PLATELET
Abs Immature Granulocytes: 0.01 10*3/uL (ref 0.00–0.07)
Basophils Absolute: 0 10*3/uL (ref 0.0–0.1)
Basophils Relative: 0 %
Eosinophils Absolute: 0.2 10*3/uL (ref 0.0–0.5)
Eosinophils Relative: 3 %
HCT: 46.4 % (ref 39.0–52.0)
Hemoglobin: 14.7 g/dL (ref 13.0–17.0)
Immature Granulocytes: 0 %
Lymphocytes Relative: 33 %
Lymphs Abs: 2.4 10*3/uL (ref 0.7–4.0)
MCH: 28.4 pg (ref 26.0–34.0)
MCHC: 31.7 g/dL (ref 30.0–36.0)
MCV: 89.7 fL (ref 80.0–100.0)
Monocytes Absolute: 0.7 10*3/uL (ref 0.1–1.0)
Monocytes Relative: 10 %
Neutro Abs: 3.9 10*3/uL (ref 1.7–7.7)
Neutrophils Relative %: 54 %
Platelets: 243 10*3/uL (ref 150–400)
RBC: 5.17 MIL/uL (ref 4.22–5.81)
RDW: 14.7 % (ref 11.5–15.5)
WBC: 7.3 10*3/uL (ref 4.0–10.5)
nRBC: 0 % (ref 0.0–0.2)

## 2022-02-09 LAB — ETHANOL: Alcohol, Ethyl (B): <10 mg/dL (ref <10)

## 2022-02-09 LAB — VALPROIC ACID LEVEL: Valproic Acid Lvl: 75 ug/mL (ref 50.0–100.0)

## 2022-02-09 NOTE — ED Triage Notes (Signed)
Pt reports tremors worsening over the past 2 days. Seen for same in the past.  Reports compliant with meds.  Pt ambulatory to triage in NAD

## 2022-02-09 NOTE — ED Provider Triage Note (Signed)
Emergency Medicine Provider Triage Evaluation Note  Mark [name] , a 61 y.o. male  was evaluated in triage.  Pt complains of tremors. Recurrent tremors for nearly 6 months.  Seen multiple times for same.  Was seen by neurology a week ago but didn't received much help.  Here with persistent tremors.  No fever, no pain, no changes in medication  Review of Systems  Positive: As above Negative: As above  Physical Exam  BP 128/68 (BP Location: Right Arm)   Pulse 92   Temp 97.8 F (36.6 C) (Oral)   Resp 18   SpO2 99%  Gen:   Awake, no distress   Resp:  Normal effort  MSK:   Moves extremities without difficulty  Other:    Medical Decision Making  Medically screening exam initiated at 9:51 PM.  Appropriate orders placed.  Mark [name] was informed that the remainder of the evaluation will be completed by another provider, this initial triage assessment does not replace that evaluation, and the importance of remaining in the ED until their evaluation is complete.     Domenic Moras, PA-C 02/09/22 2153

## 2022-02-10 LAB — RAPID URINE DRUG SCREEN, HOSP PERFORMED
Amphetamines: NOT DETECTED
Barbiturates: NOT DETECTED
Benzodiazepines: NOT DETECTED
Cocaine: NOT DETECTED
Opiates: NOT DETECTED
Tetrahydrocannabinol: NOT DETECTED

## 2022-02-10 NOTE — ED Provider Notes (Signed)
[facility]-EMERGENCY DEPT Provider Note   CSN: [id number] Arrival date & time: 02/09/22  2053     History  Chief Complaint  Patient presents with   Tremors    Mark [name] is a 61 y.o. male.  HPI     This is a 61 year old male with a history of schizophrenia, COPD, hypertension who presents with tremor.  He has been seen and evaluated for the same multiple times in recent history.  He has had work-up including MRI in July which has been negative.  He was referred to neurology.  Patient presents with ongoing tremor.  He states that it is unchanged.  He reports compliance with his medications.  He denies any pain.  He states he has not followed up with neurology.   Home Medications Prior to Admission medications   Medication Sig Start Date End Date Taking? Authorizing Provider  amLODipine (NORVASC) 10 MG tablet Take 1 tablet (10 mg total) by mouth daily. 01/05/22   Sarita Bottom, MD  aspirin EC 81 MG tablet Take 81 mg by mouth daily. Swallow whole.    [provider]  benztropine (COGENTIN) 1 MG tablet Take 1 tablet (1 mg total) by mouth 2 (two) times daily. 01/04/22 02/03/22  Sarita Bottom, MD  divalproex (DEPAKOTE ER) 500 MG 24 hr tablet Take 1 tablet (500 mg total) by mouth 2 (two) times daily. 01/04/22 02/03/22  Sarita Bottom, MD  gabapentin (NEURONTIN) 400 MG capsule Take 1 capsule (400 mg total) by mouth 3 (three) times daily. 01/04/22   Sarita Bottom, MD  haloperidol (HALDOL) 10 MG tablet Take 1 tablet (10 mg total) by mouth 2 (two) times daily. 01/04/22   Sarita Bottom, MD  haloperidol decanoate (HALDOL DECANOATE) 100 MG/ML injection Inject 2 mLs (200 mg total) into the muscle every 30 (thirty) days. 02/02/22   Sarita Bottom, MD  traZODone (DESYREL) 150 MG tablet Take 1 tablet (150 mg total) by mouth at bedtime. 01/04/22   Sarita Bottom, MD  valbenazine ([name]) 40 MG capsule Take 1 capsule (40 mg total) by mouth daily. 01/05/22   Sarita Bottom, MD   Vitamin D, Ergocalciferol, (DRISDOL) 1.25 MG (50000 UNIT) CAPS capsule Take 1 capsule (50,000 Units total) by mouth once a week. 01/09/22   Sarita Bottom, MD      Allergies    Patient has no known allergies.    Review of Systems   Review of Systems  Neurological:  Positive for tremors.  All other systems reviewed and are negative.   Physical Exam Updated Vital Signs BP (!) 155/91 (BP Location: Right Arm)   Pulse 83   Temp 97.8 F (36.6 C) (Oral)   Resp 18   SpO2 100%  Physical Exam Vitals and nursing note reviewed.  Constitutional:      Appearance: He is well-developed. He is not ill-appearing.  HENT:     Head: Normocephalic and atraumatic.  Eyes:     Pupils: Pupils are equal, round, and reactive to light.  Cardiovascular:     Rate and Rhythm: Normal rate and regular rhythm.     Heart sounds: Normal heart sounds. No murmur heard. Pulmonary:     Effort: Pulmonary effort is normal. No respiratory distress.     Breath sounds: Normal breath sounds.  Abdominal:     Palpations: Abdomen is soft.     Tenderness: There is no abdominal tenderness.  Musculoskeletal:     Cervical back: Neck supple.  Lymphadenopathy:     Cervical: No  cervical adenopathy.  Skin:    General: Skin is warm and dry.  Neurological:     Mental Status: He is alert and oriented to person, place, and time.     Comments: Tremor noted, mostly left arm but at times full body.  Tremor at times appears distractible and improves with intention, 5 out of 5 strength in all 4 extremities, no dysmetria to finger-nose-finger  Psychiatric:        Mood and Affect: Mood normal.     ED Results / Procedures / Treatments   Labs (all labs ordered are listed, but only abnormal results are displayed) Labs Reviewed  COMPREHENSIVE METABOLIC PANEL  ETHANOL  CBC WITH DIFFERENTIAL/PLATELET  VALPROIC ACID LEVEL  RAPID URINE DRUG SCREEN, HOSP PERFORMED    EKG None  Radiology No results  found.  Procedures Procedures    Medications Ordered in ED Medications - No data to display  ED Course/ Medical Decision Making/ A&P                           Medical Decision Making  This patient presents to the ED for concern of tremor, this involves an extensive number of treatment options, and is a complaint that carries with it a high risk of complications and morbidity.  I considered the following differential and admission for this acute, potentially life threatening condition.  The differential diagnosis includes essential tremor, chronic tremor, tremor related to drug effect  MDM:    This is a 61 year old male who presents with ongoing tremor.  No changes.  Has not followed up with neurology as an outpatient.  He is nontoxic.  Vital signs are notable for blood pressure 157/85.  Patient endorses that there has been no real change in his tremor.  He is otherwise neurologically intact.  Labs obtained and reviewed.  No significant metabolic derangements.  Depakote level is therapeutic.  I again stressed to the patient that he has had a full work-up in the ED and has exhausted ED resources.  Ultimately, he needs to follow-up with neurology for further work-up and plan.  Initially I was unable to get in contact with his legal guardian; however, he called me back.  Discussed the plan with Mark [name] who stated understanding.  I stressed again to him the importance of neurology follow-up.  Do not feel he needs advanced imaging or further work-up at this time.  (Labs, imaging, consults)  Labs: I Ordered, and personally interpreted labs.  The pertinent results include: CBC, BMP, Depakote level  Imaging Studies ordered: I ordered imaging studies including none I independently visualized and interpreted imaging. I agree with the radiologist interpretation  Additional history obtained from legal guardian.  External records from outside source obtained and reviewed including prior  evaluations and MRI  Cardiac Monitoring: The patient was maintained on a cardiac monitor.  I personally viewed and interpreted the cardiac monitored which showed an underlying rhythm of: Sinus rhythm  Reevaluation: After the interventions noted above, I reevaluated the patient and found that they have :stayed the same  Social Determinants of Health: Schizophrenia, has legal guardian  Disposition: Discharge  Co morbidities that complicate the patient evaluation  Past Medical History:  Diagnosis Date   COPD (chronic obstructive pulmonary disease) (HCC)    Hypertension    Schizophrenia (HCC)      Medicines No orders of the defined types were placed in this encounter.   I have reviewed the patients home  medicines and have made adjustments as needed  Problem List / ED Course: Problem List Items Addressed This Visit   None Visit Diagnoses     Tremor    -  Primary                   Final Clinical Impression(s) / ED Diagnoses Final diagnoses:  Tremor    Rx / DC Orders ED Discharge Orders          Ordered    Ambulatory referral to Neurology       Comments: An appointment is requested in approximately: 1 week   02/10/22 0128              Merryl Hacker, MD 02/10/22 [phone]

## 2022-02-10 NOTE — Discharge Instructions (Addendum)
You were seen today again for your tremor.  Your work-up was reassuring.  You have had previous MRI and full work-up that has been reassuring.  It is important you follow-up with neurology as an outpatient for further outpatient work-up.  Continue medications as prescribed.

## 2022-03-04 ENCOUNTER — Encounter (HOSPITAL_COMMUNITY): Payer: Self-pay | Admitting: Emergency Medicine

## 2022-03-04 ENCOUNTER — Emergency Department (HOSPITAL_COMMUNITY)
Admission: EM | Admit: 2022-03-04 | Discharge: 2022-03-04 | Disposition: A | Discharge status: Home / Self Care | Payer: Medicaid Other | Attending: Student | Admitting: Student

## 2022-03-04 ENCOUNTER — Other Ambulatory Visit: Payer: Self-pay

## 2022-03-04 DIAGNOSIS — R251 Tremor, unspecified: Secondary | ICD-10-CM | POA: Insufficient documentation

## 2022-03-04 DIAGNOSIS — Z79899 Other long term (current) drug therapy: Secondary | ICD-10-CM | POA: Insufficient documentation

## 2022-03-04 DIAGNOSIS — J449 Chronic obstructive pulmonary disease, unspecified: Secondary | ICD-10-CM | POA: Insufficient documentation

## 2022-03-04 DIAGNOSIS — I10 Essential (primary) hypertension: Secondary | ICD-10-CM | POA: Diagnosis not present

## 2022-03-04 DIAGNOSIS — F17210 Nicotine dependence, cigarettes, uncomplicated: Secondary | ICD-10-CM | POA: Insufficient documentation

## 2022-03-04 DIAGNOSIS — Z7982 Long term (current) use of aspirin: Secondary | ICD-10-CM | POA: Diagnosis not present

## 2022-03-04 LAB — BASIC METABOLIC PANEL
Anion gap: 10 (ref 5–15)
BUN: 13 mg/dL (ref 6–20)
CO2: 23 mmol/L (ref 22–32)
Calcium: 10.5 mg/dL — ABNORMAL HIGH (ref 8.9–10.3)
Chloride: 109 mmol/L (ref 98–111)
Creatinine, Ser: 1.34 mg/dL — ABNORMAL HIGH (ref 0.61–1.24)
GFR, Estimated: >60 mL/min (ref >60)
Glucose, Bld: 111 mg/dL — ABNORMAL HIGH (ref 70–99)
Potassium: 5 mmol/L (ref 3.5–5.1)
Sodium: 142 mmol/L (ref 135–145)

## 2022-03-04 LAB — CBC WITH DIFFERENTIAL/PLATELET
Abs Immature Granulocytes: 0.01 10*3/uL (ref 0.00–0.07)
Basophils Absolute: 0 10*3/uL (ref 0.0–0.1)
Basophils Relative: 0 %
Eosinophils Absolute: 0.2 10*3/uL (ref 0.0–0.5)
Eosinophils Relative: 3 %
HCT: 48.6 % (ref 39.0–52.0)
Hemoglobin: 15 g/dL (ref 13.0–17.0)
Immature Granulocytes: 0 %
Lymphocytes Relative: 44 %
Lymphs Abs: 2.5 10*3/uL (ref 0.7–4.0)
MCH: 28.6 pg (ref 26.0–34.0)
MCHC: 30.9 g/dL (ref 30.0–36.0)
MCV: 92.6 fL (ref 80.0–100.0)
Monocytes Absolute: 0.6 10*3/uL (ref 0.1–1.0)
Monocytes Relative: 10 %
Neutro Abs: 2.5 10*3/uL (ref 1.7–7.7)
Neutrophils Relative %: 43 %
Platelets: DECREASED 10*3/uL (ref 150–400)
RBC: 5.25 MIL/uL (ref 4.22–5.81)
RDW: 14.7 % (ref 11.5–15.5)
WBC: 5.8 10*3/uL (ref 4.0–10.5)
nRBC: 0 % (ref 0.0–0.2)

## 2022-03-04 MED ORDER — DIAZEPAM 5 MG PO TABS
5.0000 mg | ORAL_TABLET | Freq: Once | ORAL | Status: AC
  Administered 2022-03-04: 5 mg via ORAL
  Filled 2022-03-04: qty 1

## 2022-03-04 MED ORDER — LORAZEPAM 1 MG PO TABS: 1.0000 mg | ORAL_TABLET | Freq: Once | ORAL | Status: DC

## 2022-03-04 NOTE — ED Triage Notes (Signed)
EMS stated, he has tremors in his arms for the last 6 months. Was referred to a neurologist.

## 2022-03-04 NOTE — ED Provider Notes (Signed)
[facility] EMERGENCY DEPARTMENT Provider Note  CSN: [id number] Arrival date & time: 03/04/22 1014  Chief Complaint(s) Tremors  HPI Mark [name] is a 61 y.o. male with PMH COPD, HTN, schizophrenia, tardive dyskinesia, known tremoring who presents emergency department for evaluation of tremors.  He has been seen in the emergency department 6 times since 01/07/2022 for the same presentation and that each of these presentations it has been stressed to him and his guardian the importance of following up outpatient with neurology.  The patient still has yet to follow-up with neurology.  Patient appears to be on multiple medications including gabapentin, Cogentin that should be helping the patient with the symptoms.  He denies any exacerbation or worsening of these tremors today.  Denies chest pain, shortness of breath, abdominal pain, nausea, vomiting or other systemic symptoms.   Past Medical History Past Medical History:  Diagnosis Date   COPD (chronic obstructive pulmonary disease) ([place])    Hypertension    Schizophrenia ([place])    Patient Active Problem List   Diagnosis Date Noted   Tardive dyskinesia 01/02/2022   Psychosis ([place]) 12/27/2021   Paranoia (psychosis) ([place]) 12/04/2021   Schizophrenia ([place]) 11/26/2021   Mood altered 11/26/2021   Acute encephalopathy 11/25/2021   AMS (altered mental status) 11/24/2021   Hypercalcemia 11/24/2021   Hypernatremia 11/24/2021   Elevated troponin 11/24/2021   AKI (acute kidney injury) ([place]) 123XX123   Metabolic acidosis 123XX123   HTN (hypertension) 11/24/2021   Leukocytosis 11/24/2021   Drug overdose 06/28/2021   AMS (altered mental status) 06/28/2021   AKI (acute kidney injury) ([place]) 06/28/2021   Hyperglycemia 06/28/2021   History of adenomatous polyp of colon 11/04/2018   Schizophrenia ([place]) 11/04/2018   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 Authorizing Provider  amLODipine (NORVASC) 10 MG tablet Take 1 tablet (10 mg total) by mouth daily. 01/05/22   Dian Situ, MD  aspirin EC 81 MG tablet Take 81 mg by mouth daily. Swallow whole.    [provider]  benztropine (COGENTIN) 1 MG tablet Take 1 tablet (1 mg total) by mouth 2 (two) times daily. 01/04/22 02/03/22  Dian Situ, MD  divalproex (DEPAKOTE ER) 500 MG 24 hr tablet Take 1 tablet (500 mg total) by mouth 2 (two) times daily. 01/04/22 02/03/22  Dian Situ, MD  gabapentin (NEURONTIN) 400 MG capsule Take 1 capsule (400 mg total) by mouth 3 (three) times daily. 01/04/22   Dian Situ, MD  haloperidol (HALDOL) 10 MG tablet Take 1 tablet (10 mg total) by mouth 2 (two) times daily. 01/04/22   Dian Situ, MD  haloperidol decanoate (HALDOL DECANOATE) 100 MG/ML injection Inject 2 mLs (200 mg total) into the muscle every 30 (thirty) days. 02/02/22   Dian Situ, MD  traZODone (DESYREL) 150 MG tablet Take 1 tablet (150 mg total) by mouth at bedtime. 01/04/22   Dian Situ, MD  valbenazine ([name]) 40 MG capsule Take 1 capsule (40 mg total) by mouth daily. 01/05/22   Dian Situ, MD  Vitamin D, Ergocalciferol, (DRISDOL) 1.25 MG (50000 UNIT) CAPS capsule Take 1 capsule (50,000 Units total) by  mouth once a week. 01/09/22   Dian Situ, MD                                                                                                                                    Past Surgical History Past Surgical History:  Procedure Laterality Date   LOWER EXTREMITY ANGIOGRAPHY N/A 09/18/2017   Procedure: LOWER EXTREMITY ANGIOGRAPHY-  Recheck lysis;  Surgeon: Waynetta Sandy, MD;  Location: [facility];  Service: Cardiovascular;  Laterality: N/A;   PERIPHERAL VASCULAR INTERVENTION Left 09/18/2017   Procedure: PERIPHERAL VASCULAR INTERVENTION;  Surgeon: Waynetta Sandy, MD;  Location: [facility];  Service: Cardiovascular;  Laterality: Left;   THROMBECTOMY FEMORAL ARTERY Left 09/17/2017   Procedure: POSSIBLE THROMBECTOMY;  Surgeon: Waynetta Sandy, MD;  Location: [facility];  Service: Vascular;  Laterality: Left;   VENOGRAM Left 09/17/2017   Procedure: ULTRASOUND POPLITEAL ACCESS; CENTRAL VENOGRAM, IVVS, LYSIS CATHETER PLACEMENT;  Surgeon: Waynetta Sandy, MD;  Location: [facility];  Service: Vascular;  Laterality: Left;   Family History No family history on file.  Social History Social History   Tobacco Use   Smoking status: Every Day    Packs/day: 0.50    Types: Cigarettes   Smokeless tobacco: Never  Vaping Use   Vaping Use: Never used  Substance Use Topics   Alcohol use: No   Drug use: Never   Allergies Patient has no known allergies.  Review of Systems Review of Systems  Neurological:  Positive for tremors.    Physical Exam Vital Signs  I have reviewed the triage vital signs BP (!) 149/72   Pulse 90   Temp 98 F (36.7 C)   Resp 18   SpO2 98%   Physical Exam Constitutional:      General: He is not in acute distress.    Appearance: Normal appearance.  HENT:     Head: Normocephalic and atraumatic.     Nose: No congestion or rhinorrhea.  Eyes:     General:        Right eye: No discharge.        Left eye: No discharge.     Extraocular Movements: Extraocular movements intact.     Pupils: Pupils are equal, round, and reactive to light.  Cardiovascular:     Rate and Rhythm: Normal rate and regular rhythm.     Heart sounds: No murmur heard. Pulmonary:     Effort: No respiratory distress.     Breath sounds: No wheezing or rales.  Abdominal:      General: There is no distension.     Tenderness: There is no abdominal tenderness.  Musculoskeletal:        General: Normal range of motion.     Cervical back: Normal range of motion.  Skin:    General: Skin is warm and dry.  Neurological:     General: No focal deficit present.  Mental Status: He is alert.     Comments: Tremors of the arms and legs that are fatigable     ED Results and Treatments Labs (all labs ordered are listed, but only abnormal results are displayed) Labs Reviewed  BASIC METABOLIC PANEL - Abnormal; Notable for the following components:      Result Value   Glucose, Bld 111 (*)    Creatinine, Ser 1.34 (*)    Calcium 10.5 (*)    All other components within normal limits  CBC WITH DIFFERENTIAL/PLATELET                                                                                                                          Radiology No results found.  Pertinent labs & imaging results that were available during my care of the patient were reviewed by me and considered in my medical decision making (see MDM for details).  Medications Ordered in ED Medications  LORazepam (ATIVAN) tablet 1 mg (has no administration in time range)                                                                                                                                     Procedures Procedures  (including critical care time)  Medical Decision Making / ED Course   This patient presents to the ED for concern of tremors, this involves an extensive number of treatment options, and is a complaint that carries with it a high risk of complications and morbidity.  The differential diagnosis includes tardive dyskinesia, progression of underlying essential tremor, medication side effect, electrolyte abnormality, behavioral tic  MDM: Patient seen emergency room for evaluation of tremors.  Physical exam reveals tremoring of the upper and lower extremities that appear to be  consistent with previously described tremors in his 5 other ER presentations.  Laboratory evaluation with a mild creatinine elevation to 1.34 and calcium 10.5 and patient was encouraged to increase his oral water intake at home.  Given that this presentation appears to be consistent with a longstanding problem and there is been no head trauma, additional imaging was deferred at this time.  He was given 1 diazepam tablet and his tremor significantly improved.  I spoke with his guardian Merrik Puebla who will come and pick the patient up.  Patient does have a neurology appointment scheduled for March 14, 2022.   Additional history obtained:  -External  records from outside source obtained and reviewed including: Chart review including previous notes, labs, imaging, consultation notes   Lab Tests: -I ordered, reviewed, and interpreted labs.   The pertinent results include:   Labs Reviewed  BASIC METABOLIC PANEL - Abnormal; Notable for the following components:      Result Value   Glucose, Bld 111 (*)    Creatinine, Ser 1.34 (*)    Calcium 10.5 (*)    All other components within normal limits  CBC WITH DIFFERENTIAL/PLATELET       Medicines ordered and prescription drug management: Meds ordered this encounter  Medications   LORazepam (ATIVAN) tablet 1 mg    -I have reviewed the patients home medicines and have made adjustments as needed  Critical interventions none  Cardiac Monitoring: The patient was maintained on a cardiac monitor.  I personally viewed and interpreted the cardiac monitored which showed an underlying rhythm of: NSR  Social Determinants of Health:  Factors impacting patients care include: none   Reevaluation: After the interventions noted above, I reevaluated the patient and found that they have :improved  Co morbidities that complicate the patient evaluation  Past Medical History:  Diagnosis Date   COPD (chronic obstructive pulmonary disease) ([place])     Hypertension    Schizophrenia ([place])       Dispostion: I considered admission for this patient, but he currently does not meet inpatient criteria for admission and safe for discharge with outpatient follow-up     Final Clinical Impression(s) / ED Diagnoses Final diagnoses:  None     @PCDICTATION @    Teressa Lower, MD 03/04/22 2114

## 2022-03-04 NOTE — ED Provider Triage Note (Signed)
Emergency Medicine Provider Triage Evaluation Note  Mark [name] , a 61 y.o. male  was evaluated in triage.  Pt complains of tremors x6 months.  Patient has been evaluated numerous times for same complaint.  Patient has a history of schizophrenia and on chronic medication.  He was seen in the ED on 10/12 and was given Versed and Cogentin with improvement in symptoms.  Patient has been told he needs to follow-up with neurology however, has not done so yet.  Review of Systems  Positive: tremors Negative: fever  Physical Exam  BP (!) 172/147 (BP Location: Left Arm)   Pulse 73   Temp (!) 97 F (36.1 C)   Resp 17   SpO2 92%  Gen:   Awake, no distress   Resp:  Normal effort  MSK:   Moves extremities without difficulty  Other:  tremors  Medical Decision Making  Medically screening exam initiated at 10:50 AM.  Appropriate orders placed.  Mark [name] was informed that the remainder of the evaluation will be completed by another provider, this initial triage assessment does not replace that evaluation, and the importance of remaining in the ED until their evaluation is complete.  labs   Mark [name], [place] 03/04/22 1053

## 2022-03-04 NOTE — ED Notes (Signed)
Pt. Has visible hand tremors

## 2022-03-14 ENCOUNTER — Encounter: Payer: Self-pay | Admitting: Neurology

## 2022-03-14 ENCOUNTER — Ambulatory Visit: Payer: Medicaid Other | Admitting: Neurology

## 2022-05-13 ENCOUNTER — Encounter: Payer: Self-pay | Admitting: Neurology

## 2022-05-19 NOTE — Progress Notes (Signed)
Assessment/Plan:   Parkinsonism  -I had a long counseling session with the patient and his father today.  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  I explained that one clinically cannot tell the difference between idiopathic parkinsons disease and secondary parkinsonism from medication.  I also explained that even if one is able to get off of the medication, it can take up to 6 months to clinically definitively know if this is idiopathic parkinsons disease.  I did not advise that the patient go off of medication, as this needs to be discussed with the patients prescribing physician.  I did, however, tell the patient that the longer one is on the medication, the worse the symptoms can get.  However, given that he is being treated for schizophrenia probably may not be able to get off of these medications.  Symptoms really started when patient was treated with Haldol (previously treated with clozapine, which would not cause the symptoms).  Patient/caregiver really needs to discuss side effects with psychiatry.  I called [facility] today to discuss with treating nurse practitioner Carita Pian).  They stated that the he would have to call me back and I was unable to speak to him when the patient was in the office.   Subjective:   Mark [name] was seen today in the movement disorders clinic for neurologic consultation at the request of Melburn Popper, *.  The consultation is for the evaluation of schizophrenia.  Patient was seen by Dr. Melburn Popper, * on March 15, 2022.  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  He is referred here for second opinion.  Patient's father is present and supplements the history.  Patient's father noticed that symptoms of tremor and slowness and stiffness started about 8 months ago.  Patient does have a diagnosis of schizophrenia and was previously treated  with clozaril.  This was changed to Haldol about 1 year ago.  Father doesn't know why it was changed.  [name] was added 6-7 months ago for this, but his father thinks but he also had tongue movements then.  His tongue is better.  Pt lives at home with his father, his brother and his nephew.  He gets psychiatric care at [facility].  Pt admits to visual and auditory hallucinations.  Patient's father states that patient has had a dramatic shift in his life since the movements is started.  He used to be very active and be able to go to the gym and he cannot do any of that now because he cannot walk well.   Prior exposure to reglan/antipsychotics: Yes.  , haldol 10 mg bid along with Haldol IM 100mg /ml, 2ml monthly  Patient apparently had MRI brain November 25, 2021.   ALLERGIES:  No Known Allergies  CURRENT MEDICATIONS:  Current Meds  Medication Sig   amLODipine (NORVASC) 10 MG tablet Take 1 tablet (10 mg total) by mouth daily.   aspirin EC 81 MG tablet Take 81 mg by mouth daily. Swallow whole.   gabapentin (NEURONTIN) 100 MG capsule Take 100 mg by mouth daily.   gabapentin (NEURONTIN) 400 MG capsule Take 1 capsule (400 mg total) by mouth 3 (three) times daily.   haloperidol (HALDOL) 10 MG tablet Take 1 tablet (10 mg total) by mouth 2 (two) times daily.   haloperidol decanoate (HALDOL DECANOATE) 100 MG/ML injection Inject 2 mLs (200 mg total) into the muscle every 30 (  thirty) days.   omeprazole (PRILOSEC) 20 MG capsule Take 20 mg by mouth daily.   traZODone (DESYREL) 150 MG tablet Take 1 tablet (150 mg total) by mouth at bedtime.   valbenazine ([name]) 40 MG capsule Take 1 capsule (40 mg total) by mouth daily.   Vitamin D, Ergocalciferol, (DRISDOL) 1.25 MG (50000 UNIT) CAPS capsule Take 1 capsule (50,000 Units total) by mouth once a week.     Objective:   VITALS:   Vitals:   05/20/22 0941  BP: 126/82  Pulse: 67  SpO2: 100%  Weight: 164 lb 6.4 oz (74.6 kg)  Height: 5\' 11"  (1.803 m)     GEN:  The patient appears stated age and is in NAD. HEENT:  Normocephalic, atraumatic.  The mucous membranes are moist. The superficial temporal arteries are without ropiness or tenderness. CV:  RRR Lungs:  CTAB Neck/HEME:  There are no carotid bruits bilaterally.  Neurological examination:  Orientation: The patient is alert and oriented to person and place.  He actually is able to provide much of his history fairly well, giving me the name of his psychiatric nurse practitioner. Cranial nerves: There is good facial symmetry. Extraocular muscles are intact. The visual fields are full to confrontational testing. The speech is fluent and dysarthric. Soft palate rises symmetrically and there is no tongue deviation. Hearing is intact to conversational tone. Sensation: Sensation is intact to light and pinprick throughout (facial, trunk, extremities). Vibration is intact at the bilateral ankle. There is no extinction with double simultaneous stimulation. There is no sensory dermatomal level identified. Motor: Strength is 5/5 in the bilateral upper and lower extremities.   Shoulder shrug is equal and symmetric.  There is no pronator drift. Deep tendon reflexes: Deep tendon reflexes are 3/4 at the bilateral biceps, triceps, brachioradialis, patella and achilles. Plantar responses are downgoing bilaterally.  Movement examination: Tone: There is moderate increased tone in the bilateral upper extremities, left greater than right.  Tone is only mildly increased in the left lower extremity and normal on the right. Abnormal movements: There is bilateral upper extremity rest tremor, left greater than right. Coordination:  There is  decremation with RAM's, with all rough dominating movements. Gait and Station: The patient pushes off to arise.  He is short stepped.  He has decreased arm swing.  He shuffles. I have reviewed and interpreted the following labs independently   Chemistry      Component Value  Date/Time   NA 142 03/04/2022 1058   K 5.0 03/04/2022 1058   CL 109 03/04/2022 1058   CO2 23 03/04/2022 1058   BUN 13 03/04/2022 1058   CREATININE 1.34 (H) 03/04/2022 1058   CREATININE 1.06 02/11/2021 0000      Component Value Date/Time   CALCIUM 10.5 (H) 03/04/2022 1058   CALCIUM 11.0 (H) 11/24/2021 1706   ALKPHOS 59 02/09/2022 2204   AST 15 02/09/2022 2204   ALT 11 02/09/2022 2204   BILITOT 0.3 02/09/2022 2204      Lab Results  Component Value Date   TSH 3.541 07/04/2021   Lab Results  Component Value Date   WBC 5.8 03/04/2022   HGB 15.0 03/04/2022   HCT 48.6 03/04/2022   MCV 92.6 03/04/2022   PLT  03/04/2022    PLATELET CLUMPS NOTED ON SMEAR, COUNT APPEARS DECREASED     Total time spent on today's visit was 45 minutes, including both face-to-face time and nonface-to-face time.  Time included that spent on review of records (prior notes  available to me/labs/imaging if pertinent), discussing treatment and goals, answering patient's questions and coordinating care.  Cc:  Nolene Ebbs, MD

## 2022-05-20 ENCOUNTER — Encounter: Payer: Self-pay | Admitting: Neurology

## 2022-05-20 ENCOUNTER — Ambulatory Visit: Payer: Medicaid Other | Admitting: Neurology

## 2022-05-20 VITALS — BP 126/82 | HR 67 | Ht 71.0 in | Wt 164.4 lb

## 2022-05-20 DIAGNOSIS — G2111 Neuroleptic induced parkinsonism: Secondary | ICD-10-CM | POA: Diagnosis not present

## 2022-05-20 DIAGNOSIS — T43505A Adverse effect of unspecified antipsychotics and neuroleptics, initial encounter: Secondary | ICD-10-CM

## 2022-06-09 ENCOUNTER — Ambulatory Visit: Payer: Medicaid Other | Admitting: Neurology

## 2022-08-17 ENCOUNTER — Encounter (HOSPITAL_COMMUNITY): Payer: Self-pay

## 2022-08-17 ENCOUNTER — Other Ambulatory Visit: Payer: Self-pay

## 2022-08-17 ENCOUNTER — Emergency Department (HOSPITAL_COMMUNITY)
Admission: EM | Admit: 2022-08-17 | Discharge: 2022-08-17 | Disposition: A | Discharge status: Home / Self Care | Payer: Medicaid Other | Attending: Emergency Medicine | Admitting: Emergency Medicine

## 2022-08-17 DIAGNOSIS — Z7982 Long term (current) use of aspirin: Secondary | ICD-10-CM | POA: Insufficient documentation

## 2022-08-17 DIAGNOSIS — J449 Chronic obstructive pulmonary disease, unspecified: Secondary | ICD-10-CM | POA: Diagnosis not present

## 2022-08-17 DIAGNOSIS — Z79899 Other long term (current) drug therapy: Secondary | ICD-10-CM | POA: Diagnosis not present

## 2022-08-17 DIAGNOSIS — R638 Other symptoms and signs concerning food and fluid intake: Secondary | ICD-10-CM | POA: Diagnosis present

## 2022-08-17 LAB — CBC WITH DIFFERENTIAL/PLATELET
Abs Immature Granulocytes: 0.02 10*3/uL (ref 0.00–0.07)
Basophils Absolute: 0 10*3/uL (ref 0.0–0.1)
Basophils Relative: 0 %
Eosinophils Absolute: 0 10*3/uL (ref 0.0–0.5)
Eosinophils Relative: 0 %
HCT: 45.1 % (ref 39.0–52.0)
Hemoglobin: 14.4 g/dL (ref 13.0–17.0)
Immature Granulocytes: 0 %
Lymphocytes Relative: 11 %
Lymphs Abs: 1.2 10*3/uL (ref 0.7–4.0)
MCH: 29.7 pg (ref 26.0–34.0)
MCHC: 31.9 g/dL (ref 30.0–36.0)
MCV: 93 fL (ref 80.0–100.0)
Monocytes Absolute: 1.2 10*3/uL — ABNORMAL HIGH (ref 0.1–1.0)
Monocytes Relative: 12 %
Neutro Abs: 7.8 10*3/uL — ABNORMAL HIGH (ref 1.7–7.7)
Neutrophils Relative %: 77 %
Platelets: 201 10*3/uL (ref 150–400)
RBC: 4.85 MIL/uL (ref 4.22–5.81)
RDW: 14.3 % (ref 11.5–15.5)
WBC: 10.3 10*3/uL (ref 4.0–10.5)
nRBC: 0 % (ref 0.0–0.2)

## 2022-08-17 LAB — COMPREHENSIVE METABOLIC PANEL
ALT: 11 U/L (ref 0–44)
AST: 15 U/L (ref 15–41)
Albumin: 3.3 g/dL — ABNORMAL LOW (ref 3.5–5.0)
Alkaline Phosphatase: 47 U/L (ref 38–126)
Anion gap: 7 (ref 5–15)
BUN: 15 mg/dL (ref 6–20)
CO2: 27 mmol/L (ref 22–32)
Calcium: 9.6 mg/dL (ref 8.9–10.3)
Chloride: 106 mmol/L (ref 98–111)
Creatinine, Ser: 1.22 mg/dL (ref 0.61–1.24)
GFR, Estimated: >60 mL/min (ref >60)
Glucose, Bld: 107 mg/dL — ABNORMAL HIGH (ref 70–99)
Potassium: 4 mmol/L (ref 3.5–5.1)
Sodium: 140 mmol/L (ref 135–145)
Total Bilirubin: 0.8 mg/dL (ref 0.3–1.2)
Total Protein: 6.7 g/dL (ref 6.5–8.1)

## 2022-08-17 LAB — VALPROIC ACID LEVEL: Valproic Acid Lvl: 108 ug/mL — ABNORMAL HIGH (ref 50.0–100.0)

## 2022-08-17 MED ORDER — DIVALPROEX SODIUM ER 500 MG PO TB24
500.0000 mg | ORAL_TABLET | Freq: Two times a day (BID) | ORAL | Status: DC
  Administered 2022-08-17: 500 mg via ORAL
  Filled 2022-08-17: qty 1

## 2022-08-17 MED ORDER — ONDANSETRON 4 MG PO TBDP
4.0000 mg | ORAL_TABLET | Freq: Once | ORAL | Status: AC
  Administered 2022-08-17: 4 mg via ORAL
  Filled 2022-08-17: qty 1

## 2022-08-17 MED ORDER — AMLODIPINE BESYLATE 5 MG PO TABS
10.0000 mg | ORAL_TABLET | Freq: Every day | ORAL | Status: DC
  Administered 2022-08-17: 10 mg via ORAL
  Filled 2022-08-17: qty 2

## 2022-08-17 NOTE — Discharge Instructions (Signed)
Your laboratory results were within normal limits today.  Please continue to take your medication to help with your symptoms.

## 2022-08-17 NOTE — ED Triage Notes (Signed)
BIBA from home with c/o decreased appetite with no meds x2 days Abnormal gait with tremor at baseline

## 2022-08-17 NOTE — ED Provider Notes (Signed)
[place] EMERGENCY DEPARTMENT AT [facility] Provider Note   CSN: [id number] Arrival date & time: 08/17/22  1826     History  Chief Complaint  Patient presents with   decreased appetite    Mark [name] is a 62 y.o. male.  62 y.o male with a PMH of schizophrenia, COPD and resting tremor presents to the ED via EMS with a chief complaint of decrease in oral intake over the last 2 days.  According to legal guardian, patient has had no oral intake, has not been taking his meds, has not been acting himself.  Prior time where this occurred, patient had to be admitted due to dehydration.  Patient is not reporting any history of complaint at this time.  The history is provided by medical records and the EMS personnel.       Home Medications Prior to Admission medications   Medication Sig Start Date End Date Taking? Authorizing Provider  amLODipine (NORVASC) 10 MG tablet Take 1 tablet (10 mg total) by mouth daily. 01/05/22   Sarita Bottom, MD  aspirin EC 81 MG tablet Take 81 mg by mouth daily. Swallow whole.    [provider]  benztropine (COGENTIN) 1 MG tablet Take 1 tablet (1 mg total) by mouth 2 (two) times daily. 01/04/22 02/03/22  Sarita Bottom, MD  divalproex (DEPAKOTE ER) 500 MG 24 hr tablet Take 1 tablet (500 mg total) by mouth 2 (two) times daily. 01/04/22 02/03/22  Sarita Bottom, MD  gabapentin (NEURONTIN) 100 MG capsule Take 100 mg by mouth daily. 02/11/22   [provider]  gabapentin (NEURONTIN) 400 MG capsule Take 1 capsule (400 mg total) by mouth 3 (three) times daily. 01/04/22   Sarita Bottom, MD  haloperidol (HALDOL) 10 MG tablet Take 1 tablet (10 mg total) by mouth 2 (two) times daily. 01/04/22   Sarita Bottom, MD  haloperidol decanoate (HALDOL DECANOATE) 100 MG/ML injection Inject 2 mLs (200 mg total) into the muscle every 30 (thirty) days. 02/02/22   Sarita Bottom, MD  omeprazole (PRILOSEC) 20 MG capsule Take 20 mg by mouth daily. 02/11/22    [provider]  traZODone (DESYREL) 150 MG tablet Take 1 tablet (150 mg total) by mouth at bedtime. 01/04/22   Sarita Bottom, MD  valbenazine ([name]) 40 MG capsule Take 1 capsule (40 mg total) by mouth daily. 01/05/22   Sarita Bottom, MD  Vitamin D, Ergocalciferol, (DRISDOL) 1.25 MG (50000 UNIT) CAPS capsule Take 1 capsule (50,000 Units total) by mouth once a week. 01/09/22   Sarita Bottom, MD      Allergies    Patient has no known allergies.    Review of Systems   Review of Systems  Physical Exam Updated Vital Signs BP (!) 159/108   Pulse (!) 58   Temp 98 F (36.7 C) (Oral)   Resp 20   Wt 74 kg   SpO2 92%   BMI 22.75 kg/m  Physical Exam  ED Results / Procedures / Treatments   Labs (all labs ordered are listed, but only abnormal results are displayed) Labs Reviewed  CBC WITH DIFFERENTIAL/PLATELET - Abnormal; Notable for the following components:      Result Value   Neutro Abs 7.8 (*)    Monocytes Absolute 1.2 (*)    All other components within normal limits  COMPREHENSIVE METABOLIC PANEL - Abnormal; Notable for the following components:   Glucose, Bld 107 (*)    Albumin 3.3 (*)    All other components within  normal limits  VALPROIC ACID LEVEL - Abnormal; Notable for the following components:   Valproic Acid Lvl 108 (*)    All other components within normal limits    EKG None  Radiology No results found.  Procedures Procedures    Medications Ordered in ED Medications  ondansetron (ZOFRAN-ODT) disintegrating tablet 4 mg (has no administration in time range)  amLODipine (NORVASC) tablet 10 mg (has no administration in time range)  divalproex (DEPAKOTE ER) 24 hr tablet 500 mg (has no administration in time range)    ED Course/ Medical Decision Making/ A&P                            Medical Decision Making Amount and/or Complexity of Data Reviewed Labs: ordered.  Risk Prescription drug management.   Patient presents to the ED via EMS for a  decrease in oral intake.  Patient has not been eating or drinking over the last 2 days, has had a decrease in oral intake according to his brother..  Mother reports concern for patient not being able to take his blood pressure medication, along with his schizophrenia medication at home he has not been referring to him.  Patient voices very little complaints at this time, does not report any signs of pain during my exam.  He is resting, with a present tremor which is at his baseline.  Interpretation of his blood work revealed CBC with normal limits.  CMP without any electrolyte derangement, creatinine levels unremarkable.  Lifteez are within normal limits.  Valproic acid level slightly elevated but within his baseline.  He was provided here with Norvasc, Depakote, Zofran to help with symptoms.  He has not had any episodes of vomiting here.  Saturation was 92% but does not have a.  Suspect perhaps viral illness, however he has a benign abdominal exam, lungs are patient.  I discussed with brother who is present at the bedside importance of continue to push fluids, will need to follow-up with primary care physician.   Portions of this note were generated with Scientist, clinical (histocompatibility and immunogenetics). Dictation errors may occur despite best attempts at proofreading.   Final Clinical Impression(s) / ED Diagnoses Final diagnoses:  Decreased oral intake    Rx / DC Orders ED Discharge Orders     None         Claude Manges, PA-C 08/17/22 2143    Loetta Rough, MD 08/18/22 [phone]

## 2022-08-17 NOTE — ED Notes (Signed)
Pt provided with graham crackers and a cup of water per provider.

## 2022-08-17 NOTE — ED Provider Triage Note (Signed)
Emergency Medicine Provider Triage Evaluation Note  Mark [name] , a 62 y.o. male  was evaluated in triage.  Pt complains of not taking care of himself, not oral intake or medication intake in 2 days. He does not provide any history.   Review of Systems  Positive:  Negative:   Physical Exam  There were no vitals taken for this visit. Gen:   Awake, no distress   Resp:  Normal effort  MSK:   Moves extremities without difficulty  Other:  Resting tremor at baseline, no facial asymmetry  Medical Decision Making  Medically screening exam initiated at 6:34 PM.  Appropriate orders placed.  [name] was informed that the remainder of the evaluation will be completed by another provider, this initial triage assessment does not replace that evaluation, and the importance of remaining in the ED until their evaluation is complete.     Claude Manges, PA-C 08/17/22 1835

## 2022-08-17 NOTE — ED Notes (Addendum)
Patient verbalizes understanding of discharge instructions. Opportunity for questioning and answers were provided. Armband removed by staff, pt discharged from ED. Wheeled out to father's car

## 2022-10-19 ENCOUNTER — Emergency Department (HOSPITAL_COMMUNITY)
Admission: EM | Admit: 2022-10-19 | Discharge: 2022-10-21 | Disposition: A | Discharge status: Home / Self Care | Payer: Medicaid Other | Attending: Emergency Medicine | Admitting: Emergency Medicine

## 2022-10-19 DIAGNOSIS — F203 Undifferentiated schizophrenia: Secondary | ICD-10-CM | POA: Diagnosis not present

## 2022-10-19 DIAGNOSIS — Z7982 Long term (current) use of aspirin: Secondary | ICD-10-CM | POA: Diagnosis not present

## 2022-10-19 DIAGNOSIS — F29 Unspecified psychosis not due to a substance or known physiological condition: Secondary | ICD-10-CM | POA: Diagnosis not present

## 2022-10-19 DIAGNOSIS — R443 Hallucinations, unspecified: Secondary | ICD-10-CM | POA: Diagnosis not present

## 2022-10-19 DIAGNOSIS — F22 Delusional disorders: Secondary | ICD-10-CM | POA: Insufficient documentation

## 2022-10-19 DIAGNOSIS — Z79899 Other long term (current) drug therapy: Secondary | ICD-10-CM | POA: Diagnosis not present

## 2022-10-19 DIAGNOSIS — F209 Schizophrenia, unspecified: Secondary | ICD-10-CM | POA: Diagnosis present

## 2022-10-19 DIAGNOSIS — G2401 Drug induced subacute dyskinesia: Secondary | ICD-10-CM | POA: Diagnosis present

## 2022-10-19 DIAGNOSIS — I10 Essential (primary) hypertension: Secondary | ICD-10-CM | POA: Diagnosis not present

## 2022-10-19 DIAGNOSIS — F17210 Nicotine dependence, cigarettes, uncomplicated: Secondary | ICD-10-CM | POA: Insufficient documentation

## 2022-10-19 DIAGNOSIS — J449 Chronic obstructive pulmonary disease, unspecified: Secondary | ICD-10-CM | POA: Diagnosis not present

## 2022-10-19 LAB — COMPREHENSIVE METABOLIC PANEL
ALT: 16 U/L (ref 0–44)
AST: 23 U/L (ref 15–41)
Albumin: 3.2 g/dL — ABNORMAL LOW (ref 3.5–5.0)
Alkaline Phosphatase: 32 U/L — ABNORMAL LOW (ref 38–126)
Anion gap: 10 (ref 5–15)
BUN: 28 mg/dL — ABNORMAL HIGH (ref 6–20)
CO2: 25 mmol/L (ref 22–32)
Calcium: 9.7 mg/dL (ref 8.9–10.3)
Chloride: 104 mmol/L (ref 98–111)
Creatinine, Ser: 1.42 mg/dL — ABNORMAL HIGH (ref 0.61–1.24)
GFR, Estimated: 57 mL/min — ABNORMAL LOW (ref >60)
Glucose, Bld: 127 mg/dL — ABNORMAL HIGH (ref 70–99)
Potassium: 3.7 mmol/L (ref 3.5–5.1)
Sodium: 139 mmol/L (ref 135–145)
Total Bilirubin: 0.8 mg/dL (ref 0.3–1.2)
Total Protein: 6.7 g/dL (ref 6.5–8.1)

## 2022-10-19 LAB — RAPID URINE DRUG SCREEN, HOSP PERFORMED
Amphetamines: NOT DETECTED
Barbiturates: NOT DETECTED
Benzodiazepines: POSITIVE — AB
Cocaine: NOT DETECTED
Opiates: NOT DETECTED
Tetrahydrocannabinol: NOT DETECTED

## 2022-10-19 LAB — CBC WITH DIFFERENTIAL/PLATELET
Abs Immature Granulocytes: 0.01 10*3/uL (ref 0.00–0.07)
Basophils Absolute: 0 10*3/uL (ref 0.0–0.1)
Basophils Relative: 0 %
Eosinophils Absolute: 0 10*3/uL (ref 0.0–0.5)
Eosinophils Relative: 0 %
HCT: 38.6 % — ABNORMAL LOW (ref 39.0–52.0)
Hemoglobin: 12.4 g/dL — ABNORMAL LOW (ref 13.0–17.0)
Immature Granulocytes: 0 %
Lymphocytes Relative: 44 %
Lymphs Abs: 1.6 10*3/uL (ref 0.7–4.0)
MCH: 30.6 pg (ref 26.0–34.0)
MCHC: 32.1 g/dL (ref 30.0–36.0)
MCV: 95.3 fL (ref 80.0–100.0)
Monocytes Absolute: 0.5 10*3/uL (ref 0.1–1.0)
Monocytes Relative: 13 %
Neutro Abs: 1.5 10*3/uL — ABNORMAL LOW (ref 1.7–7.7)
Neutrophils Relative %: 43 %
Platelets: 114 10*3/uL — ABNORMAL LOW (ref 150–400)
RBC: 4.05 MIL/uL — ABNORMAL LOW (ref 4.22–5.81)
RDW: 13.3 % (ref 11.5–15.5)
WBC: 3.6 10*3/uL — ABNORMAL LOW (ref 4.0–10.5)
nRBC: 0 % (ref 0.0–0.2)

## 2022-10-19 LAB — ETHANOL: Alcohol, Ethyl (B): <10 mg/dL (ref <10)

## 2022-10-19 LAB — SALICYLATE LEVEL: Salicylate Lvl: <7 mg/dL — ABNORMAL LOW (ref 7.0–30.0)

## 2022-10-19 LAB — ACETAMINOPHEN LEVEL: Acetaminophen (Tylenol), Serum: <10 ug/mL — ABNORMAL LOW (ref 10–30)

## 2022-10-19 MED ORDER — TRAZODONE HCL 50 MG PO TABS
150.0000 mg | ORAL_TABLET | Freq: Every day | ORAL | Status: DC
  Administered 2022-10-19 – 2022-10-20 (×2): 150 mg via ORAL
  Filled 2022-10-19: qty 2
  Filled 2022-10-19: qty 1

## 2022-10-19 MED ORDER — HALOPERIDOL 5 MG PO TABS
10.0000 mg | ORAL_TABLET | Freq: Two times a day (BID) | ORAL | Status: DC
  Administered 2022-10-20 (×2): 10 mg via ORAL
  Filled 2022-10-19 (×2): qty 2

## 2022-10-19 MED ORDER — GABAPENTIN 400 MG PO CAPS
400.0000 mg | ORAL_CAPSULE | Freq: Three times a day (TID) | ORAL | Status: DC
  Administered 2022-10-19 – 2022-10-20 (×5): 400 mg via ORAL
  Filled 2022-10-19 (×5): qty 1

## 2022-10-19 MED ORDER — MIDAZOLAM HCL 2 MG/2ML IJ SOLN
2.0000 mg | Freq: Once | INTRAMUSCULAR | Status: AC
  Administered 2022-10-19: 2 mg via INTRAMUSCULAR
  Filled 2022-10-19: qty 2

## 2022-10-19 MED ORDER — VALBENAZINE TOSYLATE 40 MG PO CAPS
40.0000 mg | ORAL_CAPSULE | Freq: Every day | ORAL | Status: DC
  Administered 2022-10-20: 40 mg via ORAL
  Filled 2022-10-19 (×2): qty 1

## 2022-10-19 MED ORDER — OLANZAPINE 10 MG IM SOLR
10.0000 mg | Freq: Once | INTRAMUSCULAR | Status: AC
  Administered 2022-10-19: 10 mg via INTRAMUSCULAR
  Filled 2022-10-19: qty 10

## 2022-10-19 MED ORDER — GABAPENTIN 100 MG PO CAPS: 100.0000 mg | ORAL_CAPSULE | Freq: Every day | ORAL | Status: DC

## 2022-10-19 MED ORDER — DIVALPROEX SODIUM ER 500 MG PO TB24
500.0000 mg | ORAL_TABLET | Freq: Two times a day (BID) | ORAL | Status: DC
  Administered 2022-10-19 – 2022-10-20 (×4): 500 mg via ORAL
  Filled 2022-10-19 (×4): qty 1

## 2022-10-19 MED ORDER — AMLODIPINE BESYLATE 5 MG PO TABS
10.0000 mg | ORAL_TABLET | Freq: Every day | ORAL | Status: DC
  Administered 2022-10-20: 10 mg via ORAL
  Filled 2022-10-19: qty 2

## 2022-10-19 MED ORDER — STERILE WATER FOR INJECTION IJ SOLN
INTRAMUSCULAR | Status: AC
  Administered 2022-10-19: 2 mL
  Filled 2022-10-19: qty 10

## 2022-10-19 MED ORDER — BENZTROPINE MESYLATE 1 MG PO TABS
1.0000 mg | ORAL_TABLET | Freq: Two times a day (BID) | ORAL | Status: DC
  Administered 2022-10-19 – 2022-10-20 (×3): 1 mg via ORAL
  Filled 2022-10-19: qty 1
  Filled 2022-10-19: qty 2
  Filled 2022-10-19: qty 1
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
## 2022-10-19 NOTE — ED Notes (Signed)
Patient unable to urinate. Bladder scan revealed 652. MD made aware.

## 2022-10-19 NOTE — Progress Notes (Signed)
Per Alona Bene, PMHNP pt meets inpatient behavioral health placement criteria. This CSW has requested [facility] Fransico Michael, RN to review. CSW/ Disposition team will assist and follow with placement.    Maryjean Ka, MSW, LCSWA 10/19/2022 10:40 PM

## 2022-10-20 ENCOUNTER — Encounter (HOSPITAL_COMMUNITY): Payer: Self-pay | Admitting: Psychiatry

## 2022-10-20 DIAGNOSIS — F203 Undifferentiated schizophrenia: Secondary | ICD-10-CM

## 2022-10-20 NOTE — Progress Notes (Signed)
Pt was accepted to [facility] TODAY 10/20/2022. Bed assignment: Main campus  Pt meets inpatient criteria per Arsenio Loader, NP  Attending Physician will be Loni Beckwith, MD  Report can be called to: [phone] (this is a pager, please leave call-back number when giving report)  Bed is ready now  Care Team Notified: Arsenio Loader, NP and Lum Babe, RN  [place], LCSW  10/20/2022 2:53 PM
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
## 2022-10-20 NOTE — ED Notes (Signed)
Pt was accepted to [facility] TODAY 10/20/2022. Bed assignment: Main campus  Pt meets inpatient criteria per Arsenio Loader, NP  Attending Physician will be Loni Beckwith, MD  Report can be called to: [phone] (this is a pager, please leave call-back number when giving report)  Bed is ready now  Care Team Notified: Arsenio Loader, NP and Lum Babe, RN

## 2022-10-20 NOTE — ED Notes (Signed)
Patient has been alert this shift but sleeping most of the shift. Patient medication compliant.  Patient slow speech and difficult to understand at times. Patient has been calm and cooperative. No suicidal ideation noted. No homicidal ideation noted. Patient can ambulate and do ADLs.

## 2022-10-20 NOTE — Progress Notes (Signed)
LCSW Progress Note  [id number]   Mark [name]  10/20/2022  1:54 PM  Description:   Inpatient Psychiatric Referral  Patient was recommended inpatient per Arsenio Loader, NP. There are no available beds at [facility], per [facility], RN. Patient was referred to the following out of network facilities:   [facility] Provider Address Phone Fax  [facility]  [address], [place] [address] [phone]  [facility]  [address] [place] [address] [phone]  [facility]  [address] [place] [address] [phone]  [facility]  [address] [place], [place] [address] [phone]  [facility] [address] [place]  [address], [place] [address] [phone]  [facility] [place] [address] [phone]  [facility]  [address], [place] [address] [phone]  [facility]  [address] [place] [address] [phone]  [facility]  [address] [place], [place] [address] [phone]  [facility]  [address] [place] [address] [phone]  [facility]  [address] [place] [address] [phone]  [facility]  [address] [place] [address] [phone]  [facility]  [address], [place] [address] [phone]  [facility]  [address], [place] [address] [phone]  [facility]  [address] [place] [address] [phone]  [facility]  [address] [place] [address]  [phone]  [facility]  [address] [place] [address] [phone]  [facility]  [address], [place] [address] [phone]  [facility]  [address], [place] [address] [phone]  [facility]  [address] [place] [address] [phone]  [facility]  [address] [place] [address] [phone]  [facility]  [address] [place] [address] [phone]  [facility]  [address] [place] [address] [phone]  [facility]  [address] [place], [place] [address] [phone]  [facility]  [address] [place] [address] [phone]  [facility]  [address] [place] [address] [phone]  [facility]  [address] [place] [address] [phone]  [facility]  [address] [place] [address] [phone]  [facility]  [address] [place], [place] [address] [phone]  [facility]  [address], [place] [address] [phone]    Situation ongoing, CSW to continue following and update chart as more information becomes available.      Cathie Beams, [place]  10/20/2022 1:54 PM

## 2022-10-20 NOTE — ED Provider Notes (Signed)
Emergency Medicine Observation Re-evaluation Note  Mark [name] is a 62 y.o. male, seen on rounds today.  Pt initially presented to the ED for complaints of Psychiatric Evaluation Currently, the patient is sleeping.  Physical Exam  BP (!) 177/94 (BP Location: Left Arm) Comment: Nurse made aware  Pulse 98   Temp 97.9 F (36.6 C) (Oral)   Resp 20   SpO2 100%  Physical Exam General: nad Cardiac: regular rate Lungs: breathing easily Psych:   ED Course / MDM  EKG:EKG Interpretation  Date/Time:  Wednesday October 19 2022 09:35:58 EDT Ventricular Rate:  90 PR Interval:  137 QRS Duration: 80 QT Interval:  392 QTC Calculation: 480 R Axis:   76 Text Interpretation: Sinus rhythm Borderline prolonged QT interval No significant change since last tracing Confirmed by Melene Plan [id number]) on 10/19/2022 9:59:00 AM  I have reviewed the labs performed to date as well as medications administered while in observation.  Recent changes in the last 24 hours include pt had a bladder scan that was increased.  Pt subsequently has been urinating normally, urinated this am.  Plan  Current plan is for inpatient treatment.    Linwood Dibbles, MD 10/20/22 [phone]

## 2022-10-20 NOTE — ED Notes (Signed)
Sheriff dept unable to transport today will transport in the AM.

## 2022-10-20 NOTE — Progress Notes (Signed)
LCSW Progress Note  [id number]   Mark [name]  10/20/2022  12:17 AM    Inpatient Behavioral Health Placement  Pt meets inpatient criteria per Alona Bene, PMHNP. There are no available beds within [facility] per [facility] Kim Brooks,RN. Referral was sent to the following facilities;    Destination  Service Provider Address Phone Fax  [facility]  [address], [place] [address] [phone]  [facility]  [address] [place] [address] [phone]  [facility] [address]  [address] [place] [address] [phone]  [facility]  [address] [place], [place] [address] [phone]  [facility] [address] [place]  [address] [place] [address] [phone]  [facility] [place] [address] [phone]  [facility]  [address], [place] [address] [phone]  [facility]  [address] [place] [address] [phone]  [facility]  [address] [place], [place] [address] [phone]  [facility]  [address] [place] [address] [phone]  [facility]  [address] [place] [address] [phone]  [facility]  [address], [place] [address] [phone]  [facility]  [address] [place] [address] [phone]  [facility]  [address] [place] [address] [phone]  [facility]  [address], [place] [address] [phone]  [facility]  [address] [place]  [address] [phone]  [facility]  [address] [place] [address] [phone]  [facility]  [address] [place] [address] [phone]  [facility]  [address] [place], [place] [address] [phone]  [facility]  [address] [place] [address] [phone]  [facility]  [address] [place] [address] [phone]  [facility]  [address] [place] [address] [phone]  [facility]  [address] [place] [address] [phone]  [facility]  [address] [place], [place] [address] [phone]  [facility]  [address] [place] [address] [phone]  [facility]  [address] [place] [address] [phone]  [facility]  [address] [place] [address] [phone]  [facility]  [address] [place] [address] [phone]  [facility]  [address] [place], [place] [address] [phone]  [facility]  [address], [place] [address] [phone]   Situation ongoing,  CSW will follow up.    Maryjean Ka, MSW, [facility] 10/20/2022 12:17 AM

## 2022-10-21 NOTE — ED Provider Notes (Signed)
Emergency Medicine Observation Re-evaluation Note  Mark [name] is a 62 y.o. male, .  Pt initially presented to the ED for complaints of Psychiatric Evaluation Currently, the patient is resting.  Physical Exam  BP 116/67 (BP Location: Right Arm)   Pulse 60   Temp 97.7 F (36.5 C) (Oral)   Resp 16   SpO2 98%  Physical Exam General: nad  ED Course / MDM  EKG:EKG Interpretation  Date/Time:  Wednesday October 19 2022 09:35:58 EDT Ventricular Rate:  90 PR Interval:  137 QRS Duration: 80 QT Interval:  392 QTC Calculation: 480 R Axis:   76 Text Interpretation: Sinus rhythm Borderline prolonged QT interval No significant change since last tracing Confirmed by Melene Plan [phone]) on 10/19/2022 9:59:00 AM  I have reviewed the labs performed to date as well as medications administered while in observation.  Recent changes in the last 24 hours include no acute events.  Plan  Current plan is for inpt treatment.    Linwood Dibbles, MD 10/21/22 [phone]

## 2022-10-21 NOTE — ED Notes (Signed)
Reported @ handoff Kathryne Sharper has been called for [employer] to [facility]

## 2022-12-01 ENCOUNTER — Other Ambulatory Visit: Payer: Self-pay | Admitting: Internal Medicine

## 2022-12-21 IMAGING — CT CT HEAD W/O CM
3 of 4 series · 15 of 47 positions shown, 18 images · non-contrast
Comparison: CT 02/27/2020

CLINICAL DATA: Mental status change, unknown cause



[Series 3: head wo · axial · 0.43mm/px · z∈[+1395,+1520]mm · 9 of 31 slices shown, 12 images]
[im 3/31  brain]
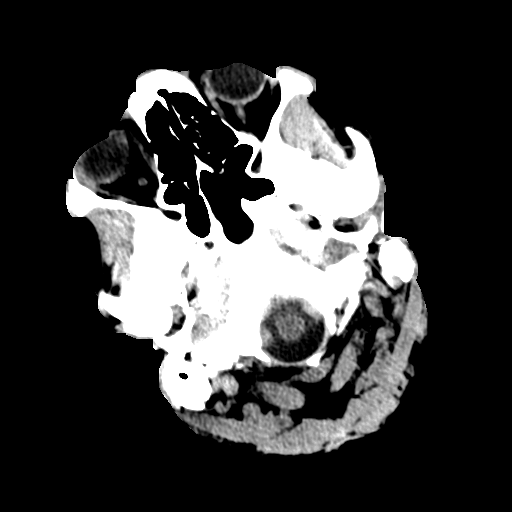
[im 3/31  bone]
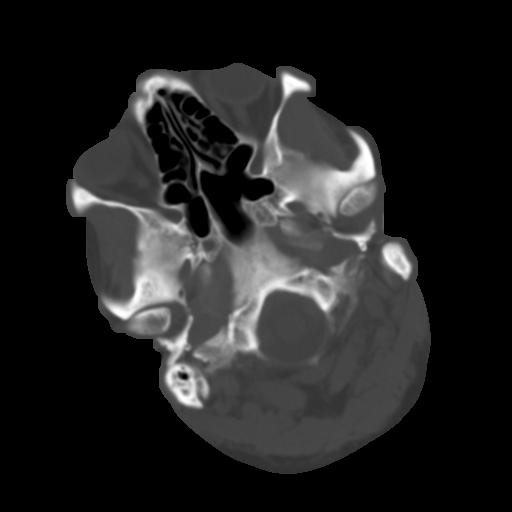
[im 7/31  brain]
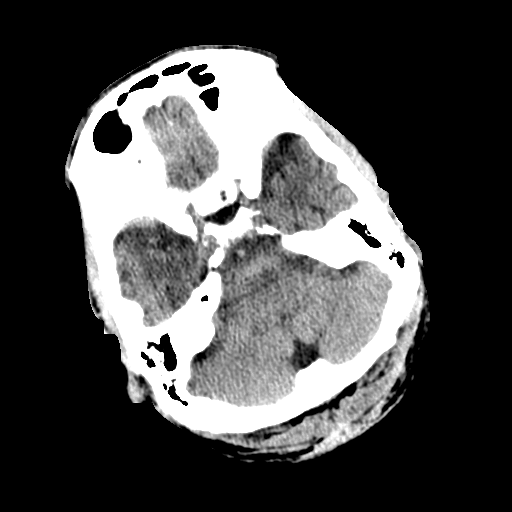
[im 9/31  brain]
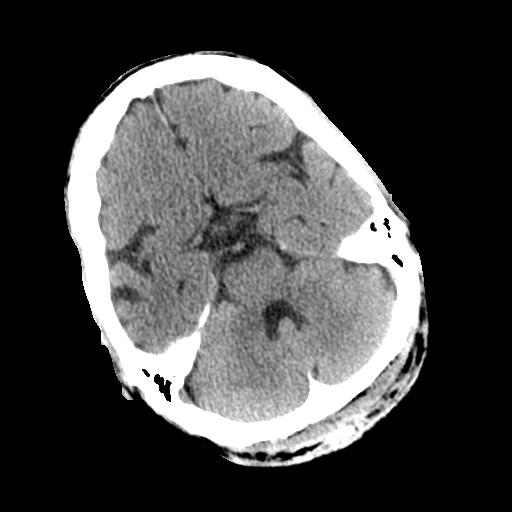
[im 13/31  brain]
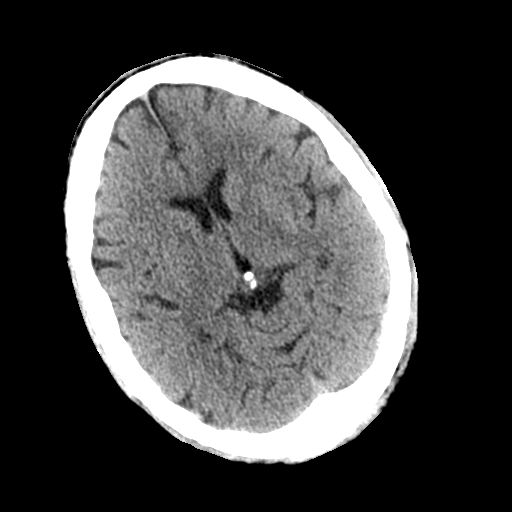
[im 16/31  brain]
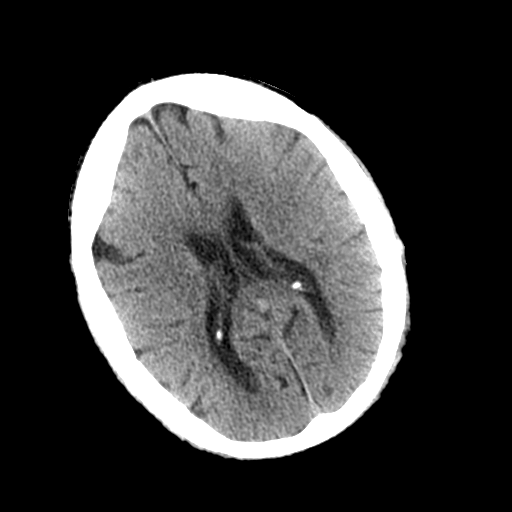
[im 16/31  bone]
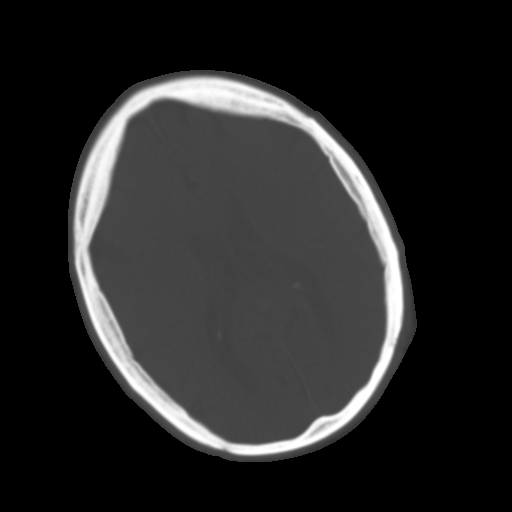
[im 18/31  brain]
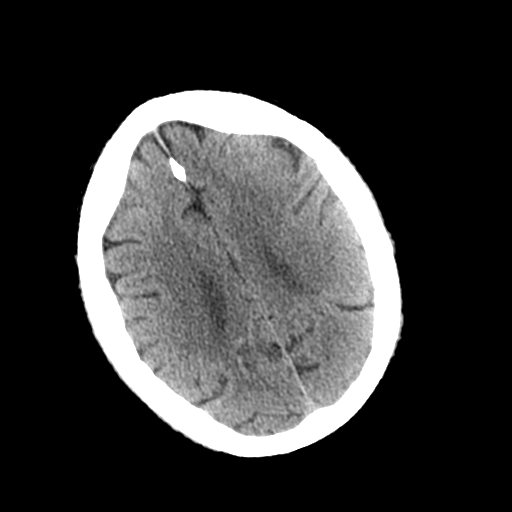
[im 22/31  brain]
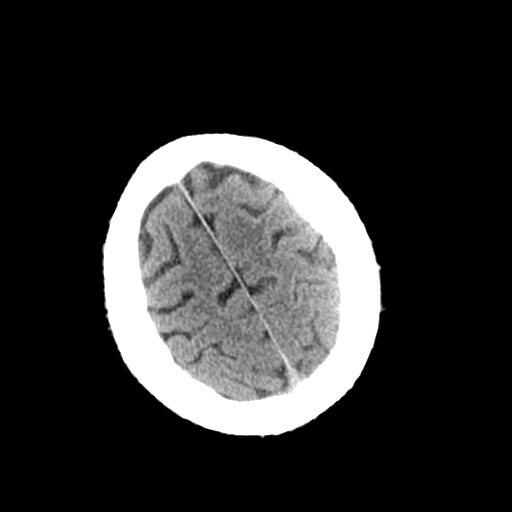
[im 24/31  brain]
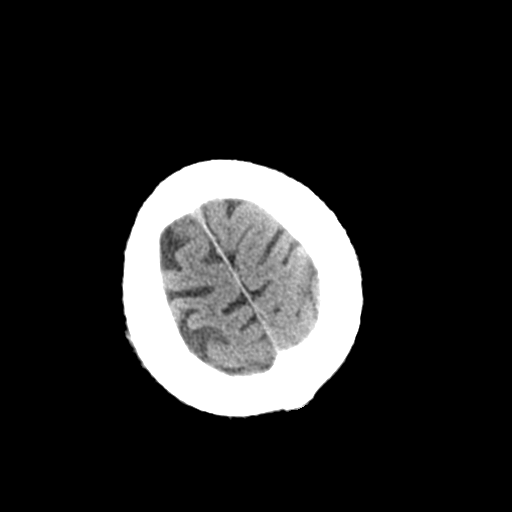
[im 28/31  brain]
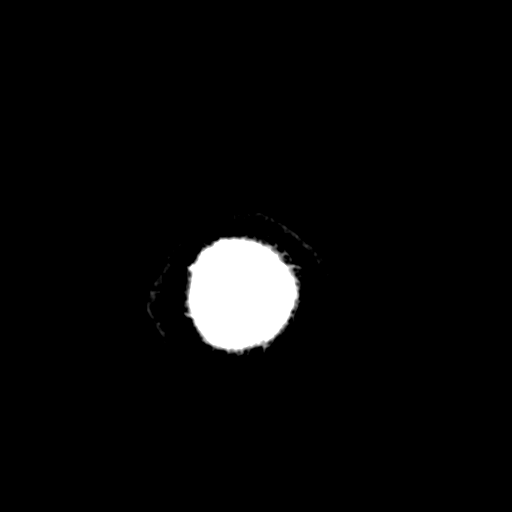
[im 28/31  bone]
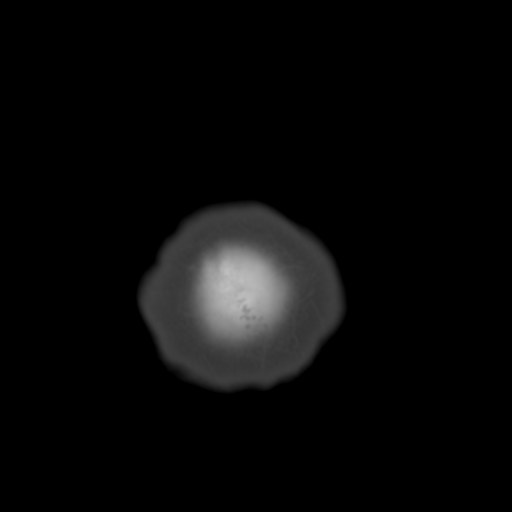

[Series 6: coronal soft tissue · coronal · 0.30mm/px · 3 of 66 slices shown]
[im 22/66  brain]
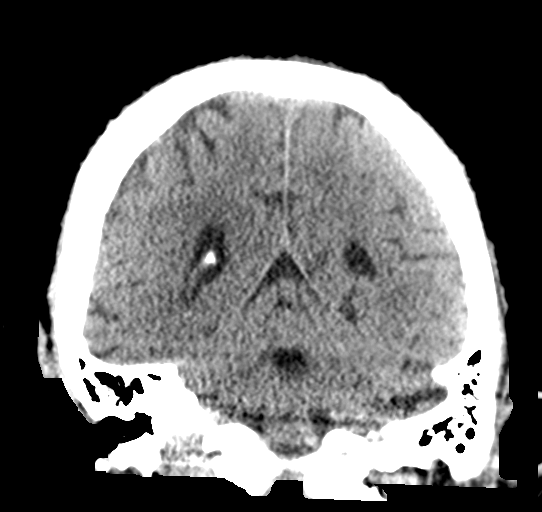
[im 29/66  brain]
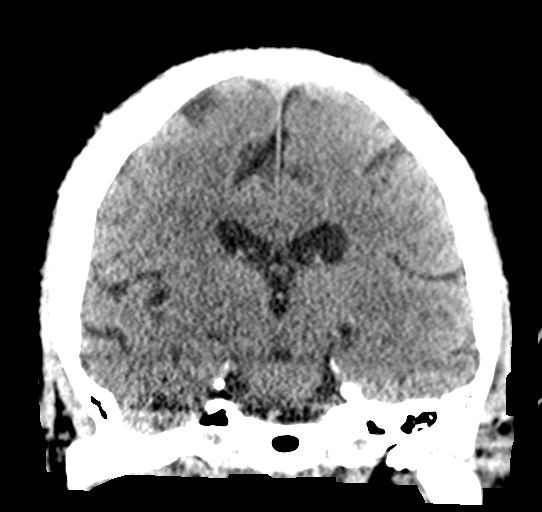
[im 37/66  brain]
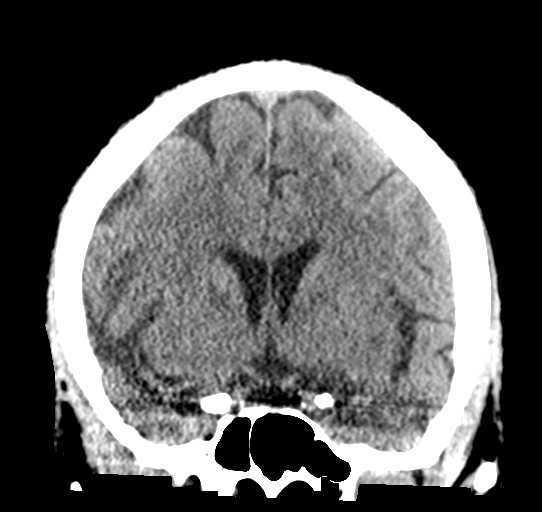

[Series 7: sagittal soft tissue · sagittal · 0.30mm/px · 3 of 56 slices shown]
[im 19/56  brain]
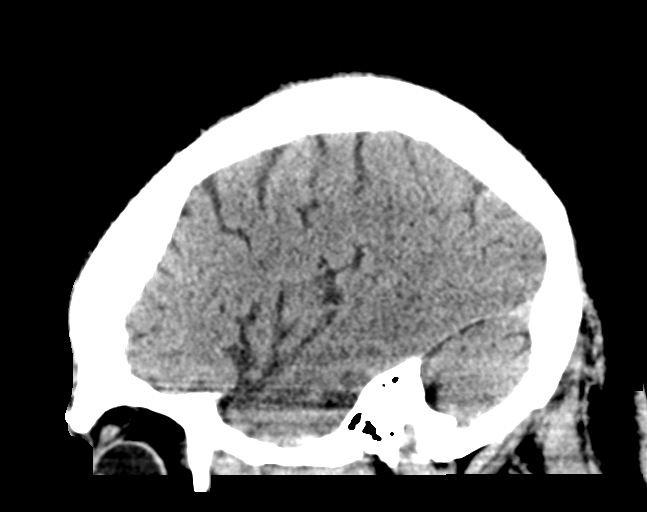
[im 28/56  brain]
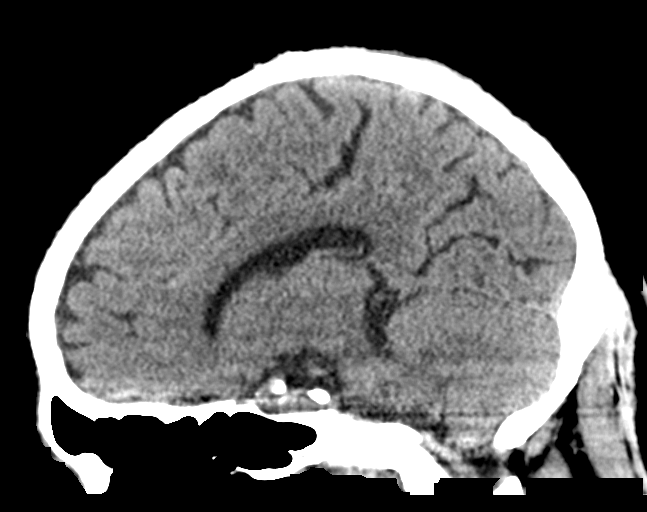
[im 37/56  brain]
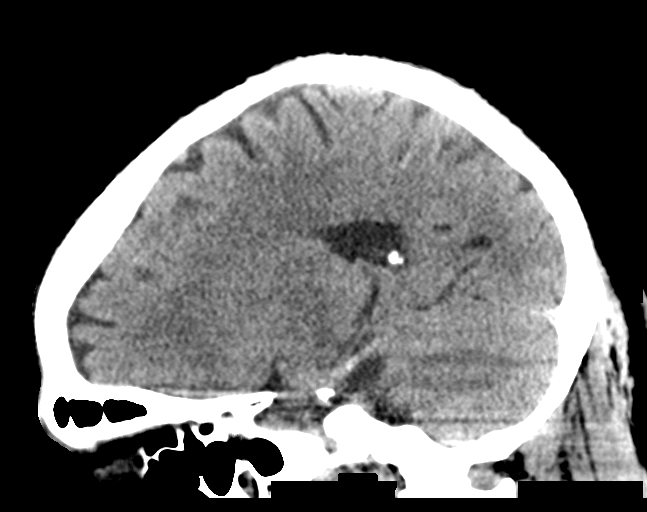

[15 of 47 positions shown; findings below may reference images not displayed]

FINDINGS: Brain: No evidence of acute intracranial hemorrhage or extra-axial
collection.No evidence of mass lesion/concern mass effect.The
ventricles are normal in size.

Vascular: No hyperdense vessel or unexpected calcification.

Skull: Negative for fracture.

Sinuses/Orbits: No acute finding.

Other: None.
IMPRESSION: No acute intracranial abnormality.

## 2022-12-29 ENCOUNTER — Emergency Department (HOSPITAL_COMMUNITY)
Admission: EM | Admit: 2022-12-29 | Discharge: 2022-12-29 | Disposition: A | Discharge status: Home / Self Care | Payer: MEDICAID | Attending: Emergency Medicine | Admitting: Emergency Medicine

## 2022-12-29 ENCOUNTER — Other Ambulatory Visit: Payer: Self-pay

## 2022-12-29 ENCOUNTER — Emergency Department (HOSPITAL_COMMUNITY): Payer: MEDICAID

## 2022-12-29 DIAGNOSIS — M25551 Pain in right hip: Secondary | ICD-10-CM | POA: Insufficient documentation

## 2022-12-29 DIAGNOSIS — Z7982 Long term (current) use of aspirin: Secondary | ICD-10-CM | POA: Diagnosis not present

## 2022-12-29 DIAGNOSIS — R251 Tremor, unspecified: Secondary | ICD-10-CM | POA: Diagnosis present

## 2022-12-29 LAB — BASIC METABOLIC PANEL
Anion gap: 8 (ref 5–15)
BUN: 18 mg/dL (ref 8–23)
CO2: 23 mmol/L (ref 22–32)
Calcium: 9.7 mg/dL (ref 8.9–10.3)
Chloride: 108 mmol/L (ref 98–111)
Creatinine, Ser: 0.81 mg/dL (ref 0.61–1.24)
GFR, Estimated: >60 mL/min (ref >60)
Glucose, Bld: 77 mg/dL (ref 70–99)
Potassium: 4.2 mmol/L (ref 3.5–5.1)
Sodium: 139 mmol/L (ref 135–145)

## 2022-12-29 LAB — VALPROIC ACID LEVEL: Valproic Acid Lvl: 93 ug/mL (ref 50.0–100.0)

## 2022-12-29 MED ORDER — BENZTROPINE MESYLATE 0.5 MG PO TABS
2.0000 mg | ORAL_TABLET | Freq: Once | ORAL | Status: AC
  Administered 2022-12-29: 2 mg via ORAL
  Filled 2022-12-29: qty 4

## 2022-12-29 NOTE — ED Triage Notes (Addendum)
Patient has had tremors for 3 months off and on in his arms. Denies drug or alcohol use. Just smoked a cigarette before coming in. Complaining of left hip pain.

## 2022-12-29 NOTE — Discharge Instructions (Signed)
It can be very difficult to identify what is actually causing tremors.  These can be associated with certain medications and can be associated with neurologic conditions as well as psychiatric illnesses.  This is a problem that you have had for a long time.  It is very important you work with combination of your family doctor, psychiatrist and a neurologist to try to get as much possible relief from symptoms. You are reporting right hip pain.  At this time your x-ray did not show any abnormalities.  You do have a firm area in your right buttock.  This is probably the result of a hardened old bruise or injections that you have previously gotten of certain medications.  Follow-up with your doctor and have them recheck this to make sure this is a stable finding.

## 2022-12-29 NOTE — ED Provider Notes (Signed)
[facility] DE[facility]MENT AT [facility] Provider Note   CSN: [id number] Arrival date & time: 12/29/22  5284     History  Chief Complaint  Patient presents with   Tremors    Mark [name] is a 62 y.o. male.  HPI Patient reports that his tremors are worse than usual.  Patient's family member reports that this situation gets better and worse at different times.  He reports last week was a pretty good week and there was minimal amount of tremor.  Other times it really escalates and the patient is much more symptomatic.  No recent medication changes.  The patient's current medications are gabapentin and Depakote.  Aced on the medications that the patient and his family member brought, which are in a well organized daily blister pack, the patient is not on any other antipsychotic medications at this time.  Reviewing the EMR suggests that the patient had previously been on Haldol but is no longer.  Patient reports pain in his right hip.  He illustrates the SI region and buttock.  No specific injury patient reports it always just hurts in his hip especially with walking.  No numbness or weakness of the leg.    Home Medications Prior to Admission medications   Medication Sig Start Date End Date Taking? Authorizing Provider  aspirin EC 81 MG tablet Take 81 mg by mouth daily. Swallow whole.    [provider]  benztropine (COGENTIN) 1 MG tablet Take 1 tablet (1 mg total) by mouth 2 (two) times daily. 01/04/22 10/20/22  Sarita Bottom, MD  divalproex (DEPAKOTE ER) 500 MG 24 hr tablet Take 1 tablet (500 mg total) by mouth 2 (two) times daily. 01/04/22 10/20/22  Sarita Bottom, MD  gabapentin (NEURONTIN) 400 MG capsule Take 1 capsule (400 mg total) by mouth 3 (three) times daily. Patient taking differently: Take 400 mg by mouth 2 (two) times daily. 01/04/22   Sarita Bottom, MD  haloperidol (HALDOL) 10 MG tablet Take 1 tablet (10 mg total) by mouth 2 (two) times  daily. Patient not taking: Reported on 10/20/2022 01/04/22   Sarita Bottom, MD  traZODone (DESYREL) 150 MG tablet Take 1 tablet (150 mg total) by mouth at bedtime. 01/04/22   Sarita Bottom, MD      Allergies    Patient has no known allergies.    Review of Systems   Review of Systems  Physical Exam Updated Vital Signs BP (!) 145/117 (BP Location: Left Arm)   Pulse 93   Temp 97.9 F (36.6 C) (Oral)   Resp 18   Ht 5\' 11"  (1.803 m)   Wt 76.2 kg   SpO2 100%   BMI 23.43 kg/m  Physical Exam Constitutional:      Comments: Patient is thin but well-nourished well-developed.  He is alert.  He is doing a flapping motion with his hands and moving his legs fairly continuously.  This waxes and wanes in amplitude.  No respiratory distress.  nontoxic appearance.  HENT:     Head: Normocephalic and atraumatic.     Mouth/Throat:     Pharynx: Oropharynx is clear.  Eyes:     Extraocular Movements: Extraocular movements intact.  Cardiovascular:     Rate and Rhythm: Normal rate and regular rhythm.  Pulmonary:     Effort: Pulmonary effort is normal.     Breath sounds: Normal breath sounds.  Abdominal:     General: There is no distension.     Palpations: Abdomen is soft.  Tenderness: There is no abdominal tenderness. There is no guarding.  Musculoskeletal:     Comments: Patient has normal range of motion of the lower extremities.  He can go from lying sitting and standing without difficulty in the stretcher.  He has gotten up and down multiple times without limitation.  He has been ambulatory without limitation.  Palpation of the spine and SI region did not reveal focal bony point tenderness.  Patient does have a firm mass which feels about size of a softball in the right buttock however this is nontender.  The overlying soft tissues are normal.  This seems most likely to be a calcified hematoma.  The lower legs are normal without edema or calf tenderness.  Skin:    General: Skin is warm and dry.   Neurological:     Comments: Patient is hyperalert and fairly constantly moving and shaking his arms and legs.  This settles down and extinguishes at certain times with interactions for examination and distraction.  There is no focal neurologic deficits.  Patient's movements are coordinated purposeful symmetric.  Focal weakness.  Grip strength is 5\5 push pull strength 5\5.  Psychiatric:     Comments: Patient is situationally oriented.  He does not appear to be responding to internal stimuli at this time.  He is however easily irritated and borders on hostility.  He is however controlled at this time and not acting out.  His family member who appears his father is present with him and patient is mildly verbally abusive with comments such as "you know anything about it you should need to be talking". Etc. to me, he made comments such as "you are not doing your job" nobody is doing anything around here.      ED Results / Procedures / Treatments   Labs (all labs ordered are listed, but only abnormal results are displayed) Labs Reviewed  VALPROIC ACID LEVEL  BASIC METABOLIC PANEL    EKG None  Radiology DG Hip Unilat W or Wo Pelvis 2-3 Views Right  Result Date: 12/29/2022 CLINICAL DATA:  pain. firm mass right buttock EXAM: DG HIP (WITH OR WITHOUT PELVIS) 2-3V RIGHT COMPARISON:  None Available. FINDINGS: Pelvis is intact with normal and symmetric sacroiliac joints. No acute fracture or dislocation. No aggressive osseous lesion. Visualized sacral arcuate lines are unremarkable. Unremarkable symphysis pubis. There are mild degenerative changes of the right hip joint without significant joint space narrowing. Osteophytosis of the superior acetabulum. No radiopaque foreign bodies. A vascular stent noted overlying the upper pelvis, likely in the common iliac vessel. IMPRESSION: *Mild degenerative changes of the right hip joint. Consideration regarding cross-sectional examination should be based on  patient's symptomatology and physical examination findings. Electronically Signed   By: Jules Schick M.D.   On: 12/29/2022 10:09    Procedures Procedures    Medications Ordered in ED Medications  benztropine (COGENTIN) tablet 2 mg (2 mg Oral Given 12/29/22 1039)    ED Course/ Medical Decision Making/ A&P                                 Medical Decision Making Amount and/or Complexity of Data Reviewed Labs: ordered. Radiology: ordered.  Risk Prescription drug management.   Patient presents as outlined.  He has had a longstanding history of schizophrenia and years of tremors.  Notes indicate there has been tardive dyskinesia but also volitional tremors.  At this time I do not  feel that any medication adjustments are to be made based on medications shown to me.  It looks like the patient is off of antipsychotics.  I did give him a trial oral dose of Cogentin to see if this was helpful.  There has not been a significant change.  At this time, I reviewed with the patient and his family member that there is not likely to be a lot to be done in the emergency department for the tremor.  This has been very longstanding.  I highly recommend close coronary care with the patient's PCP, psychiatrist and neurology.  Patient reported right hip pain.  Functionally speaking he is doing well in terms of being on the sit stand and ambulate.  No injury reported.  Examination of right buttock does have a firm mass which is consistent with a calcified hematoma.  It is really nontender and does not seem to be actually contributing to the patient's pain complaint.  When I discussed this with them he did indicate probably history of IM medications that got an inflammatory reaction.  It seemed that he was already aware of this.  I have advised the patient and his family member at bedside to follow-up with the PCP and make sure that this is a stable chronic finding and not any possibility of something such as a  sarcoma.  They voiced understanding.  Patient was discharged in stable condition with family member.          Final Clinical Impression(s) / ED Diagnoses Final diagnoses:  Tremor  Right hip pain    Rx / DC Orders ED Discharge Orders     None         Arby Barrette, MD 12/29/22 1208

## 2022-12-31 ENCOUNTER — Other Ambulatory Visit: Payer: Self-pay

## 2022-12-31 ENCOUNTER — Ambulatory Visit (HOSPITAL_COMMUNITY)
Admission: EM | Admit: 2022-12-31 | Discharge: 2023-01-01 | Disposition: A | Discharge status: Other Acute Inpatient Hospital | Payer: MEDICAID | Attending: Urology | Admitting: Urology

## 2022-12-31 DIAGNOSIS — G2401 Drug induced subacute dyskinesia: Secondary | ICD-10-CM | POA: Insufficient documentation

## 2022-12-31 DIAGNOSIS — Z79899 Other long term (current) drug therapy: Secondary | ICD-10-CM | POA: Insufficient documentation

## 2022-12-31 DIAGNOSIS — F209 Schizophrenia, unspecified: Secondary | ICD-10-CM | POA: Insufficient documentation

## 2022-12-31 LAB — LIPID PANEL
Cholesterol: 133 mg/dL (ref 0–200)
HDL: 54 mg/dL (ref >40)
LDL Cholesterol: 71 mg/dL (ref 0–99)
Total CHOL/HDL Ratio: 2.5 RATIO
Triglycerides: 39 mg/dL (ref <150)
VLDL: 8 mg/dL (ref 0–40)

## 2022-12-31 LAB — VALPROIC ACID LEVEL: Valproic Acid Lvl: 55 ug/mL (ref 50.0–100.0)

## 2022-12-31 LAB — CBC WITH DIFFERENTIAL/PLATELET
Abs Immature Granulocytes: 0.01 10*3/uL (ref 0.00–0.07)
Basophils Absolute: 0 10*3/uL (ref 0.0–0.1)
Basophils Relative: 0 %
Eosinophils Absolute: 0 10*3/uL (ref 0.0–0.5)
Eosinophils Relative: 1 %
HCT: 42.1 % (ref 39.0–52.0)
Hemoglobin: 13.7 g/dL (ref 13.0–17.0)
Immature Granulocytes: 0 %
Lymphocytes Relative: 48 %
Lymphs Abs: 3 10*3/uL (ref 0.7–4.0)
MCH: 30.6 pg (ref 26.0–34.0)
MCHC: 32.5 g/dL (ref 30.0–36.0)
MCV: 94 fL (ref 80.0–100.0)
Monocytes Absolute: 0.6 10*3/uL (ref 0.1–1.0)
Monocytes Relative: 9 %
Neutro Abs: 2.6 10*3/uL (ref 1.7–7.7)
Neutrophils Relative %: 42 %
Platelets: 203 10*3/uL (ref 150–400)
RBC: 4.48 MIL/uL (ref 4.22–5.81)
RDW: 13.2 % (ref 11.5–15.5)
WBC: 6.2 10*3/uL (ref 4.0–10.5)
nRBC: 0 % (ref 0.0–0.2)

## 2022-12-31 LAB — HEMOGLOBIN A1C
Hgb A1c MFr Bld: 5.5 % (ref 4.8–5.6)
Mean Plasma Glucose: 111.15 mg/dL

## 2022-12-31 LAB — COMPREHENSIVE METABOLIC PANEL
ALT: 10 U/L (ref 0–44)
AST: 13 U/L — ABNORMAL LOW (ref 15–41)
Albumin: 3.4 g/dL — ABNORMAL LOW (ref 3.5–5.0)
Alkaline Phosphatase: 37 U/L — ABNORMAL LOW (ref 38–126)
Anion gap: 11 (ref 5–15)
BUN: 15 mg/dL (ref 8–23)
CO2: 25 mmol/L (ref 22–32)
Calcium: 10 mg/dL (ref 8.9–10.3)
Chloride: 102 mmol/L (ref 98–111)
Creatinine, Ser: 1.07 mg/dL (ref 0.61–1.24)
GFR, Estimated: >60 mL/min (ref >60)
Glucose, Bld: 83 mg/dL (ref 70–99)
Potassium: 4 mmol/L (ref 3.5–5.1)
Sodium: 138 mmol/L (ref 135–145)
Total Bilirubin: 0.5 mg/dL (ref 0.3–1.2)
Total Protein: 6.8 g/dL (ref 6.5–8.1)

## 2022-12-31 LAB — ETHANOL: Alcohol, Ethyl (B): <10 mg/dL (ref <10)

## 2022-12-31 LAB — T4, FREE: Free T4: 0.72 ng/dL (ref 0.61–1.12)

## 2022-12-31 LAB — TSH: TSH: 5.958 u[IU]/mL — ABNORMAL HIGH (ref 0.350–4.500)

## 2022-12-31 MED ORDER — BENZTROPINE MESYLATE 1 MG PO TABS
1.0000 mg | ORAL_TABLET | Freq: Two times a day (BID) | ORAL | Status: DC
  Administered 2022-12-31 – 2023-01-01 (×3): 1 mg via ORAL
  Filled 2022-12-31 (×3): qty 1

## 2022-12-31 MED ORDER — LORAZEPAM 1 MG PO TABS: 1.0000 mg | ORAL_TABLET | Freq: Four times a day (QID) | ORAL | Status: DC | PRN

## 2022-12-31 MED ORDER — DIPHENHYDRAMINE HCL 50 MG/ML IJ SOLN: 50.0000 mg | Freq: Two times a day (BID) | INTRAMUSCULAR | Status: DC | PRN

## 2022-12-31 MED ORDER — MAGNESIUM HYDROXIDE 400 MG/5ML PO SUSP: 30.0000 mL | Freq: Every day | ORAL | Status: DC | PRN

## 2022-12-31 MED ORDER — HYDROXYZINE HCL 25 MG PO TABS
25.0000 mg | ORAL_TABLET | Freq: Three times a day (TID) | ORAL | Status: DC | PRN
  Administered 2022-12-31: 25 mg via ORAL
  Filled 2022-12-31: qty 1

## 2022-12-31 MED ORDER — LORAZEPAM 2 MG/ML IJ SOLN: 1.0000 mg | Freq: Four times a day (QID) | INTRAMUSCULAR | Status: DC | PRN

## 2022-12-31 MED ORDER — HALOPERIDOL 5 MG PO TABS
10.0000 mg | ORAL_TABLET | Freq: Two times a day (BID) | ORAL | Status: DC
  Administered 2022-12-31 – 2023-01-01 (×3): 10 mg via ORAL
  Filled 2022-12-31 (×3): qty 2

## 2022-12-31 MED ORDER — GABAPENTIN 400 MG PO CAPS
400.0000 mg | ORAL_CAPSULE | Freq: Two times a day (BID) | ORAL | Status: DC
  Administered 2022-12-31 – 2023-01-01 (×3): 400 mg via ORAL
  Filled 2022-12-31 (×3): qty 1

## 2022-12-31 MED ORDER — AMLODIPINE BESYLATE 5 MG PO TABS
5.0000 mg | ORAL_TABLET | Freq: Every day | ORAL | Status: DC
  Administered 2022-12-31 – 2023-01-01 (×2): 5 mg via ORAL
  Filled 2022-12-31 (×2): qty 1

## 2022-12-31 MED ORDER — ZIPRASIDONE MESYLATE 20 MG IM SOLR: 20.0000 mg | Freq: Two times a day (BID) | INTRAMUSCULAR | Status: DC | PRN

## 2022-12-31 MED ORDER — TRAZODONE HCL 50 MG PO TABS
50.0000 mg | ORAL_TABLET | Freq: Every evening | ORAL | Status: DC | PRN
  Administered 2022-12-31: 50 mg via ORAL
  Filled 2022-12-31: qty 1

## 2022-12-31 MED ORDER — DIVALPROEX SODIUM ER 500 MG PO TB24
500.0000 mg | ORAL_TABLET | Freq: Two times a day (BID) | ORAL | Status: DC
  Administered 2022-12-31 – 2023-01-01 (×3): 500 mg via ORAL
  Filled 2022-12-31 (×3): qty 1

## 2022-12-31 MED ORDER — ALUM & MAG HYDROXIDE-SIMETH 200-200-20 MG/5ML PO SUSP: 30.0000 mL | ORAL | Status: DC | PRN

## 2022-12-31 MED ORDER — HALOPERIDOL 10 MG PO TABS: 10.0000 mg | ORAL_TABLET | Freq: Two times a day (BID) | ORAL | Status: DC

## 2022-12-31 MED ORDER — ACETAMINOPHEN 325 MG PO TABS: 650.0000 mg | ORAL_TABLET | Freq: Four times a day (QID) | ORAL | Status: DC | PRN

## 2022-12-31 NOTE — Progress Notes (Addendum)
Pt has been accepted to [facility]. Accepting MD - Loni Beckwith MD. RN TO RN report to [phone] - pager number to leave message for call back. Adult Unit. Representative is : Charity fundraiser.

## 2022-12-31 NOTE — Discharge Instructions (Addendum)
Transfer to [facility] 

## 2022-12-31 NOTE — ED Notes (Signed)
Pt presents with anxious mood, affect congruent. Christop speech is slurred and disorganized and difficult to follow at times. Requested breakfast when writer attempted to ask assessment questions pt states '' hip hurts don't wana talk what's for breakfast. '' Pt given oatmeal and rice krispie per his request with juice. Unable to assess further as pt walked away from writer and appeared not wanting to engage in questions. Pt is safe, will con't to monitor.

## 2022-12-31 NOTE — Progress Notes (Signed)
Received additional lab orders. Called [facility] at [phone] and they confirmed they can add on all new orders that pt will not require additional draw.

## 2022-12-31 NOTE — Progress Notes (Signed)
Report called to Daleen Snook RN at [facility].
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
## 2022-12-31 NOTE — ED Notes (Signed)
Pt in recliner, alert, shaking. He became agitated and started yelling and cursing this nurse while preforming assessment. He is very agitated, cursing requesting meds. Will continue to monitor for safety

## 2022-12-31 NOTE — Progress Notes (Signed)
Attempted to call report to [facility] and left message with pager system with voice message and call back information.

## 2022-12-31 NOTE — ED Provider Notes (Cosign Needed)
FBC/OBS ASAP Discharge Summary  Date and Time: 12/31/2022 2:48 PM  Name: Mark [name]  MRN:  [id number]   Discharge Diagnoses:  Final diagnoses:  Schizophrenia, unspecified type [facility])    Subjective: "When can I leaving?" I discussed with the patient that's he been accepted to [facility] today for inpatient psychiatric treatment. Patient verbalizes understanding.   Stay Summary: Patient presented to the [facility] this morning under IVC, petitioned by his father. Per IVC, Respondent has been diagnosed with schizophrenia; respondent has been prescribed Depakote; respondent refuses to take his medication respondent is being aggressive; responding him to his father this evening; respondent has been taking illegal drugs; respondent is a danger to himself; respondent is a danger to others."     Total Time spent with patient: 15 minutes  Past Psychiatric History: History of schizophrenia, and TD  Past Medical History: History of COPD and hypertension.  Family Psychiatric History: No reported history.  Social History: Patient resides with his father, brother, and cousins. Patient denies drug use and alcohol use.  Tobacco Cessation:  Prescription not provided because: declined  Current Medications:  Current Facility-Administered Medications  Medication Dose Route Frequency Provider Last Rate Last Admin   acetaminophen (TYLENOL) tablet 650 mg  650 mg Oral Q6H PRN Ajibola, Ene A, NP       alum & mag hydroxide-simeth (MAALOX/MYLANTA) 200-200-20 MG/5ML suspension 30 mL  30 mL Oral Q4H PRN Ajibola, Ene A, NP       benztropine (COGENTIN) tablet 1 mg  1 mg Oral BID Ajibola, Ene A, NP   1 mg at 12/31/22 0910   diphenhydrAMINE (BENADRYL) injection 50 mg  50 mg Intramuscular Q12H PRN Shambhavi Salley L, NP       divalproex (DEPAKOTE ER) 24 hr tablet 500 mg  500 mg Oral BID Ajibola, Ene A, NP   500 mg at 12/31/22 8469   gabapentin (NEURONTIN) capsule 400 mg  400 mg Oral BID Ajibola, Ene A, NP   400 mg  at 12/31/22 0910   haloperidol (HALDOL) tablet 10 mg  10 mg Oral BID Ajibola, Ene A, NP   10 mg at 12/31/22 6295   hydrOXYzine (ATARAX) tablet 25 mg  25 mg Oral TID PRN Ajibola, Ene A, NP       LORazepam (ATIVAN) tablet 1 mg  1 mg Oral Q6H PRN Shaira Sova L, NP       Or   LORazepam (ATIVAN) injection 1 mg  1 mg Intramuscular Q6H PRN Vannie Hilgert L, NP       magnesium hydroxide (MILK OF MAGNESIA) suspension 30 mL  30 mL Oral Daily PRN Ajibola, Ene A, NP       traZODone (DESYREL) tablet 50 mg  50 mg Oral QHS PRN Ajibola, Ene A, NP       ziprasidone (GEODON) injection 20 mg  20 mg Intramuscular Q12H PRN Phelan Goers L, NP       Current Outpatient Medications  Medication Sig Dispense Refill   aspirin EC 81 MG tablet Take 81 mg by mouth daily. Swallow whole.     gabapentin (NEURONTIN) 400 MG capsule Take 1 capsule (400 mg total) by mouth 3 (three) times daily. (Patient taking differently: Take 400 mg by mouth at bedtime.) 90 capsule 0   traZODone (DESYREL) 150 MG tablet Take 1 tablet (150 mg total) by mouth at bedtime. 30 tablet 0   benztropine (COGENTIN) 1 MG tablet Take 1 tablet (1 mg total) by mouth 2 (two) times daily. 60  tablet 0   divalproex (DEPAKOTE ER) 500 MG 24 hr tablet Take 1 tablet (500 mg total) by mouth 2 (two) times daily. 60 tablet 0   haloperidol (HALDOL) 10 MG tablet Take 1 tablet (10 mg total) by mouth 2 (two) times daily.      PTA Medications:  Facility Ordered Medications  Medication   acetaminophen (TYLENOL) tablet 650 mg   alum & mag hydroxide-simeth (MAALOX/MYLANTA) 200-200-20 MG/5ML suspension 30 mL   magnesium hydroxide (MILK OF MAGNESIA) suspension 30 mL   traZODone (DESYREL) tablet 50 mg   hydrOXYzine (ATARAX) tablet 25 mg   benztropine (COGENTIN) tablet 1 mg   divalproex (DEPAKOTE ER) 24 hr tablet 500 mg   gabapentin (NEURONTIN) capsule 400 mg   haloperidol (HALDOL) tablet 10 mg   ziprasidone (GEODON) injection 20 mg   LORazepam (ATIVAN) tablet 1 mg    Or   LORazepam (ATIVAN) injection 1 mg   diphenhydrAMINE (BENADRYL) injection 50 mg   PTA Medications  Medication Sig   aspirin EC 81 MG tablet Take 81 mg by mouth daily. Swallow whole.   traZODone (DESYREL) 150 MG tablet Take 1 tablet (150 mg total) by mouth at bedtime.   gabapentin (NEURONTIN) 400 MG capsule Take 1 capsule (400 mg total) by mouth 3 (three) times daily. (Patient taking differently: Take 400 mg by mouth at bedtime.)   benztropine (COGENTIN) 1 MG tablet Take 1 tablet (1 mg total) by mouth 2 (two) times daily.   divalproex (DEPAKOTE ER) 500 MG 24 hr tablet Take 1 tablet (500 mg total) by mouth 2 (two) times daily.   haloperidol (HALDOL) 10 MG tablet Take 1 tablet (10 mg total) by mouth 2 (two) times daily.        No data to display          Flowsheet Row ED from 12/31/2022 in [facility] ED from 12/29/2022 in [facility] Emergency Department at [facility] ED from 08/17/2022 in [facility] Department at [facility]  C-SSRS RISK CATEGORY No Risk No Risk No Risk       Musculoskeletal  Strength & Muscle Tone: within normal limits Gait & Station: normal Patient leans: N/A  Psychiatric Specialty Exam  Presentation  General Appearance:  Appropriate for Environment  Eye Contact: Good  Speech: Clear and Coherent; Slow  Speech Volume: Decreased  Handedness: Right   Mood and Affect  Mood: Dysphoric  Affect: Congruent   Thought Process  Thought Processes: Coherent  Descriptions of Associations:Circumstantial  Orientation:Full (Time, Place and Person)  Thought Content:Logical  Diagnosis of Schizophrenia or Schizoaffective disorder in past: Yes  Duration of Psychotic Symptoms: Greater than six months   Hallucinations:Hallucinations: None  Ideas of Reference:None  Suicidal Thoughts:Suicidal Thoughts: No  Homicidal Thoughts:Homicidal Thoughts: No   Sensorium  Memory: Immediate Fair;  Recent Fair; Remote Fair  Judgment: Intact  Insight: Present   Executive Functions  Concentration: Fair  Attention Span: Fair  Recall: [employer] of Knowledge: Fair  Language: Fair   Psychomotor Activity  Psychomotor Activity: Psychomotor Activity: Tremor   Assets  Assets: Communication Skills; Desire for Improvement   Sleep  Sleep: Sleep: Poor   Nutritional Assessment (For OBS and FBC admissions only) Has the patient had a weight loss or gain of 10 pounds or more in the last 3 months?: No Has the patient had a decrease in food intake/or appetite?: No Does the patient have dental problems?: No Does the patient have eating habits  or behaviors that may be indicators of an eating disorder including binging or inducing vomiting?: No Has the patient recently lost weight without trying?: 0 Has the patient been eating poorly because of a decreased appetite?: 0 Malnutrition Screening Tool Score: 0    Physical Exam  Physical Exam Cardiovascular:     Rate and Rhythm: Normal rate.  Pulmonary:     Effort: Pulmonary effort is normal.  Musculoskeletal:        General: Normal range of motion.  Neurological:     Mental Status: He is alert and oriented to person, place, and time.    Review of Systems  Constitutional: Negative.   Eyes: Negative.   Respiratory: Negative.    Cardiovascular: Negative.   Gastrointestinal: Negative.   Genitourinary: Negative.   Musculoskeletal: Negative.   Neurological:  Positive for tremors.   Blood pressure 135/78, pulse 83, temperature 98.5 F (36.9 C), temperature source Oral, resp. rate 18, SpO2 98%. There is no height or weight on file to calculate BMI.   Plan Of Care/Follow-up recommendations:  Activity:  as tolerated  Current medication regimen.   benztropine  1 mg Oral BID   divalproex  500 mg Oral BID   gabapentin  400 mg Oral BID   haloperidol  10 mg Oral BID    Disposition: Patient accepted to [facility].  Patient is under IVC  Pt was accepted to [facility] TODAY 12/31/2022; Bed Assignment Main [facility] Fax Number: [phone] (Adult)  Attending Physician will be Dr. Lucile Crater, NP 12/31/2022, 2:48 PM

## 2022-12-31 NOTE — Progress Notes (Signed)
.  LCSW Progress Note  [id number]   Mark [name]  12/31/2022  2:15 PM    Inpatient Behavioral Health Placement  Pt meets inpatient criteria per Liborio Nixon, NP. There are no available beds within [facility] system per [facility] Antoinette Cillo, RN. Referral was sent to the following facilities;   Destination  Service Provider Address Phone [facility]  [address] [place], [place] [address] [phone]  [facility] Patient Placement  [facility] [place], [place] [phone]  [facility]  [address] [facility] Regino Bellow [place] [address] [phone]  [facility]  [address] [place] [address] [phone]  [facility] [address]  [address], [place] [address] [phone]  [facility]  [address] [place] [address] [phone]  [facility]  [address], [place] [address] [phone]  [facility]  [address] [place] [address] [phone]  [facility]  [address] [place] [address] [phone]  [facility]  [address], [place] [address] [phone]  [facility] EFAX  [address] [place], [place] [phone]  [facility]  [place] [phone]  [facility]  [address] [place] [address] [phone]  [facility]  [address], [place] [address] [phone]  [facility]  [address] [place] [address] [phone]  [facility]  [address],  [place] [address] [phone]  [facility]  [address] [place] [address] [phone]  [facility]  [address], [place] [address] [phone]  [facility] Inpatient [facility]  [place] [phone]  [facility]  [address] [place] [address] [phone]  [facility]  [address] [place] [address] [phone]  [facility]  [address] [place], [place] [address] [phone]  [facility] Center-Adult  [address] [place] [address] [phone]  [facility]  [address] [place], [place] [address] [phone]  [facility]  [address] [place] [address] [phone]  [facility]  [address], [place] [address] [phone]    [facility]  [address] [place] [address] [phone]  [facility]  [address], [place] [address] [phone]    Situation ongoing,  CSW will follow up.    Maryjean Ka, MSW, LCSWA 12/31/2022 2:15 PM

## 2022-12-31 NOTE — Progress Notes (Signed)
Pt was accepted to [facility] TODAY 12/31/2022; Bed Assignment Main [facility] Fax Number: [phone] (Adult)  Pt meets inpatient criteria per Loreen Freud    Attending Physician will be Dr. Loni Beckwith   Report can be called [phone]-Pager number, please leave a returned phone number to receive a phone call back.   Pt can arrive after: BED IS READY   Care Team notified: Alfred Levins White,NP  Maryjean Ka, MSW, [facility] 12/31/2022 2:52 PM
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
## 2022-12-31 NOTE — Progress Notes (Signed)
Pt initially refusing to take medications, however with much encouragement accepted. Writer performed mouth check and pt swallowed pills.

## 2022-12-31 NOTE — ED Notes (Signed)
Report given to Denver Faster RN at [facility]

## 2022-12-31 NOTE — ED Notes (Signed)
Pt observed to be grabbing his genitals. Redirected by [facility] that behavior inappropriate.
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
## 2022-12-31 NOTE — ED Notes (Signed)
Pt provided lunch.  Noted difficulty opening food packages.  Meal was set up for pt.  Will pass observation on to next shift.

## 2022-12-31 NOTE — Progress Notes (Signed)
Pt has been ambulatory and in no acute distress. Pt is safe, will con't to monitor.

## 2023-01-01 LAB — T3, FREE: T3, Free: 3.4 pg/mL (ref 2.0–4.4)

## 2023-01-01 MED ORDER — AMLODIPINE BESYLATE 5 MG PO TABS: 5.0000 mg | ORAL_TABLET | Freq: Every day | ORAL | Status: DC

## 2023-01-01 NOTE — ED Notes (Signed)
Pt observed/assessed in recliner sleeping. RR even and unlabored, appearing in no noted distress. Environmental check complete, will continue to monitor for safety 

## 2023-01-01 NOTE — Progress Notes (Signed)
Received a return phone call from [name], patient listed legal guardian. He was advised that patient has been transferred under IVC to [facility] today for psychiatric treatment. Mr. Mark [name] verbalizes understanding.

## 2023-01-01 NOTE — ED Notes (Signed)
Assumed care of pt at 0720. Pt is alert, oriented , eating breakfast. He remains irritable, with abrupt answers but he denies any SI HI with writer this am. He states '' I need another cheese stick. '' He has been ambulatory on the unit and able to make his needs known. Pt is safe. Will con't to monitor.

## 2023-01-01 NOTE — ED Notes (Signed)
Patient observed pacing back and forth on unit, talking loudly to himself.

## 2023-01-01 NOTE — ED Notes (Signed)
Page sent and left voicemail of updated VITALS and ETA for this pt to [facility]

## 2023-01-01 NOTE — Progress Notes (Signed)
Pt escorted ambulatory in no acute distress to [facility] via sheriff. Voice mail left with [facility] updating ETA for this pt . All belongings sent with pt and pt ate lunch prior to transport.

## 2023-01-01 NOTE — ED Notes (Signed)
Patient is observed putting his face through the opening in glass at nurses station, he is making inappropriate kissing noises at staff. Patient has been advised that is not appropriate and he can not do that.

## 2023-01-01 NOTE — ED Notes (Signed)
Patient is exposing himself to me with his pants down, telling me to look. He was told to keep his pants up and not to expose himself. He was advised no one wants to see him Naked. He is pacing the floor back and forth, he has been doing that all morning. He is also putting his face thru the open glass and making kissing sounds, he was told again, that it is not appropriate to be doing that and he needs to stop.

## 2023-01-01 NOTE — ED Notes (Signed)
Second message left for sheriff for IVC transport for this pt.

## 2023-04-25 ENCOUNTER — Emergency Department (HOSPITAL_COMMUNITY)
Admission: EM | Admit: 2023-04-25 | Discharge: 2023-04-27 | Disposition: A | Discharge status: Other Acute Inpatient Hospital | Payer: MEDICAID | Attending: Emergency Medicine | Admitting: Emergency Medicine

## 2023-04-25 ENCOUNTER — Encounter (HOSPITAL_COMMUNITY): Payer: Self-pay | Admitting: Emergency Medicine

## 2023-04-25 DIAGNOSIS — Z7982 Long term (current) use of aspirin: Secondary | ICD-10-CM | POA: Diagnosis not present

## 2023-04-25 DIAGNOSIS — I10 Essential (primary) hypertension: Secondary | ICD-10-CM | POA: Diagnosis not present

## 2023-04-25 DIAGNOSIS — J449 Chronic obstructive pulmonary disease, unspecified: Secondary | ICD-10-CM | POA: Insufficient documentation

## 2023-04-25 DIAGNOSIS — Z79899 Other long term (current) drug therapy: Secondary | ICD-10-CM | POA: Insufficient documentation

## 2023-04-25 DIAGNOSIS — R4689 Other symptoms and signs involving appearance and behavior: Secondary | ICD-10-CM | POA: Diagnosis present

## 2023-04-25 DIAGNOSIS — F200 Paranoid schizophrenia: Secondary | ICD-10-CM

## 2023-04-25 DIAGNOSIS — Z1152 Encounter for screening for COVID-19: Secondary | ICD-10-CM | POA: Diagnosis not present

## 2023-04-25 LAB — CBC WITH DIFFERENTIAL/PLATELET
Abs Immature Granulocytes: 0.02 10*3/uL (ref 0.00–0.07)
Basophils Absolute: 0 10*3/uL (ref 0.0–0.1)
Basophils Relative: 0 %
Eosinophils Absolute: 0.1 10*3/uL (ref 0.0–0.5)
Eosinophils Relative: 1 %
HCT: 46 % (ref 39.0–52.0)
Hemoglobin: 15.1 g/dL (ref 13.0–17.0)
Immature Granulocytes: 0 %
Lymphocytes Relative: 59 %
Lymphs Abs: 6.1 10*3/uL — ABNORMAL HIGH (ref 0.7–4.0)
MCH: 30.9 pg (ref 26.0–34.0)
MCHC: 32.8 g/dL (ref 30.0–36.0)
MCV: 94.3 fL (ref 80.0–100.0)
Monocytes Absolute: 0.9 10*3/uL (ref 0.1–1.0)
Monocytes Relative: 9 %
Neutro Abs: 3.3 10*3/uL (ref 1.7–7.7)
Neutrophils Relative %: 31 %
Platelets: 240 10*3/uL (ref 150–400)
RBC: 4.88 MIL/uL (ref 4.22–5.81)
RDW: 15 % (ref 11.5–15.5)
WBC: 10.5 10*3/uL (ref 4.0–10.5)
nRBC: 0 % (ref 0.0–0.2)

## 2023-04-25 LAB — ACETAMINOPHEN LEVEL: Acetaminophen (Tylenol), Serum: <10 ug/mL — ABNORMAL LOW (ref 10–30)

## 2023-04-25 LAB — COMPREHENSIVE METABOLIC PANEL
ALT: 9 U/L (ref 0–44)
AST: 19 U/L (ref 15–41)
Albumin: 4.4 g/dL (ref 3.5–5.0)
Alkaline Phosphatase: 60 U/L (ref 38–126)
Anion gap: 11 (ref 5–15)
BUN: 24 mg/dL — ABNORMAL HIGH (ref 8–23)
CO2: 20 mmol/L — ABNORMAL LOW (ref 22–32)
Calcium: 10.7 mg/dL — ABNORMAL HIGH (ref 8.9–10.3)
Chloride: 106 mmol/L (ref 98–111)
Creatinine, Ser: 1.43 mg/dL — ABNORMAL HIGH (ref 0.61–1.24)
GFR, Estimated: 56 mL/min — ABNORMAL LOW (ref >60)
Glucose, Bld: 113 mg/dL — ABNORMAL HIGH (ref 70–99)
Potassium: 3.7 mmol/L (ref 3.5–5.1)
Sodium: 137 mmol/L (ref 135–145)
Total Bilirubin: 0.4 mg/dL (ref <1.2)
Total Protein: 8.2 g/dL — ABNORMAL HIGH (ref 6.5–8.1)

## 2023-04-25 LAB — SALICYLATE LEVEL: Salicylate Lvl: <7 mg/dL — ABNORMAL LOW (ref 7.0–30.0)

## 2023-04-25 LAB — ETHANOL: Alcohol, Ethyl (B): <10 mg/dL (ref <10)

## 2023-04-25 MED ORDER — HALOPERIDOL 5 MG PO TABS
5.0000 mg | ORAL_TABLET | Freq: Two times a day (BID) | ORAL | Status: DC
Start: 2023-04-25 — End: 2023-04-27
  Administered 2023-04-25 – 2023-04-27 (×4): 5 mg via ORAL
  Filled 2023-04-25 (×4): qty 1

## 2023-04-25 MED ORDER — TRAZODONE HCL 100 MG PO TABS
100.0000 mg | ORAL_TABLET | Freq: Every evening | ORAL | Status: DC | PRN
  Administered 2023-04-25 – 2023-04-26 (×2): 100 mg via ORAL
  Filled 2023-04-25 (×2): qty 1

## 2023-04-25 MED ORDER — TRAZODONE HCL 100 MG PO TABS: 150.0000 mg | ORAL_TABLET | Freq: Every evening | ORAL | Status: DC | PRN

## 2023-04-25 MED ORDER — ZIPRASIDONE MESYLATE 20 MG IM SOLR
20.0000 mg | Freq: Once | INTRAMUSCULAR | Status: AC
  Administered 2023-04-25: 20 mg via INTRAMUSCULAR
  Filled 2023-04-25: qty 20

## 2023-04-25 MED ORDER — BENZTROPINE MESYLATE 1 MG PO TABS
1.0000 mg | ORAL_TABLET | Freq: Two times a day (BID) | ORAL | Status: DC
  Administered 2023-04-25 – 2023-04-27 (×5): 1 mg via ORAL
  Filled 2023-04-25 (×5): qty 1

## 2023-04-25 MED ORDER — HALOPERIDOL 5 MG PO TABS
10.0000 mg | ORAL_TABLET | Freq: Two times a day (BID) | ORAL | Status: DC
  Administered 2023-04-25: 10 mg via ORAL
  Filled 2023-04-25: qty 2

## 2023-04-25 MED ORDER — LORAZEPAM 1 MG PO TABS
1.0000 mg | ORAL_TABLET | Freq: Three times a day (TID) | ORAL | Status: DC | PRN
  Administered 2023-04-25 – 2023-04-26 (×4): 1 mg via ORAL
  Filled 2023-04-25 (×4): qty 1

## 2023-04-25 MED ORDER — GABAPENTIN 400 MG PO CAPS
400.0000 mg | ORAL_CAPSULE | Freq: Two times a day (BID) | ORAL | Status: DC
  Administered 2023-04-25 – 2023-04-27 (×5): 400 mg via ORAL
  Filled 2023-04-25 (×5): qty 1

## 2023-04-25 MED ORDER — DIVALPROEX SODIUM ER 500 MG PO TB24
500.0000 mg | ORAL_TABLET | Freq: Two times a day (BID) | ORAL | Status: DC
  Administered 2023-04-25 – 2023-04-27 (×5): 500 mg via ORAL
  Filled 2023-04-25 (×5): qty 1

## 2023-04-25 NOTE — ED Notes (Signed)
Pt belongings placed in locker 36.

## 2023-04-25 NOTE — ED Notes (Signed)
EDP working on [employer] paperwork at this time.
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
## 2023-04-25 NOTE — ED Notes (Signed)
Unable to get temperature at this time.

## 2023-04-25 NOTE — ED Notes (Signed)
Pt wanded by security at bedside.
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
## 2023-04-25 NOTE — ED Notes (Addendum)
Pt fully scrubbed out. Pt has one pair of socks, shoes pants, two shirts, and a hat. Pt kept his shorts on since he did not have on any underwear. One pt belonging's bad placed in Staff Multipurpose Workroom

## 2023-04-25 NOTE — Progress Notes (Signed)
LCSW Progress Note  [id number]   Mark [name]  04/25/2023  1:35 PM  Description:   Inpatient Psychiatric Referral  Patient was recommended inpatient per Dahlia Byes NP. There are no available beds at [facility], per [facility] Rona Ravens RN. Patient was referred to the following out of network facilities:   Destination  Service Provider Request Status Services Address Phone Fax Patient Preferred  [facility] Pending - Request Sent -- [address] [place] [address] [phone] --  [facility] Patient Placement Pending - Request Sent -- [facility], [place] [phone] --  [facility] Pending - Request Sent -- [address], [place] [address] [phone] --  [facility] Pending - Request Sent -- [address], [place] [address] [phone] --  [facility] Pending - Request Sent -- [address] [place], [place] [address] [phone] --  [facility] Pending - Request Sent -- [address] [place] [address] [phone] --  [facility] Pending - Request Sent -- [address] [place] [address] [phone] --  [facility] Pending - Request Sent -- [address] [place] [address] [phone] --  [facility] Pending - Request Sent -- [address] [place] [address] [phone] --  [facility] Pending - Request Sent -- [address] [place] [address] [phone] --  [facility] Pending - Request Sent -- [place] [phone] --  [facility] Pending - Request Sent -- [address] Kathie Rhodes [address], [place] [address] [phone] --   [facility] Pending - Request Sent -- [address] [name] [place] [phone] --  [facility] Pending - Request Sent -- [address], [place] [address] [phone] --  [facility] Pending - Request Sent -- [name], [place] [address] [phone] --  [facility] Pending - Request Sent -- [address] [place] [address] [phone] --  [facility] Pending - Request Sent -- [address] [place] [address] [phone] --  [facility] Pending - Request Sent -- [address] [place] [address] [phone] --      Situation ongoing, CSW to continue following and update chart as more information becomes available.      [place] Addalie Calles, MSW, LCSW  04/25/2023 1:35 PM
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
## 2023-04-26 NOTE — Progress Notes (Signed)
LCSW Progress Note  [id number]   Mark [name]  04/26/2023  11:21 AM  Description:   Inpatient Psychiatric Referral  Patient was recommended inpatient per Phebe Colla NP. There are [facility] beds at [facility], per [facility] Rona Ravens RN. Patient was referred to the following out of network facilities:    [facility] Provider Address Phone Fax  [facility] [address] [place] [address] [phone]  [facility] Patient Placement [facility], [place] [phone]  [facility] [address] [place] [address] [phone]  [facility] [address] [place] [address] [phone]  [facility] [address] [place], [place] [address] [phone]  [facility] [address] [place] [address] [phone]  [facility] [address] [place] [address] [phone]  [facility] [address] [place] [address] [phone]  [facility] [address], [place] [address] [phone]  [facility] [address] [place] [address] [phone]  [facility] [place] [phone]  [facility] [address] [place] [address] [phone]  [facility] EFAX [address] [place], [place] [phone]  [facility] [address] [place] [address] [phone]  [facility] [address], [name] [address] [phone]  [facility] [address] [place] [address] [phone]  [phone]  [facility] [address], [place] [address] [phone]  [facility] [address] [place] [address] [phone]      Situation ongoing, CSW to continue following and update chart as more information becomes available.      [place] Obbie Lewallen, MSW, LCSW  04/26/2023 11:21 AM

## 2023-04-26 NOTE — ED Notes (Signed)
Patient was talking to himself for the majority of the night and coming the desk. Patient eventually laid down and went to sleep. Patient was cooperative and took his medication.

## 2023-04-26 NOTE — ED Notes (Signed)
Breakfast tray given to patient.

## 2023-04-26 NOTE — ED Notes (Signed)
Patient alert this shift. Medication compliant.  Labile with emotions. Patient having conversations with self and people not there.  Load and upset at times. Patient is difficult to understand at times.

## 2023-04-26 NOTE — ED Notes (Signed)
Dinner tray given to patient

## 2023-04-27 ENCOUNTER — Other Ambulatory Visit: Payer: Self-pay

## 2023-04-27 ENCOUNTER — Encounter: Payer: Self-pay | Admitting: Psychiatry

## 2023-04-27 ENCOUNTER — Inpatient Hospital Stay
Admission: AD | Admit: 2023-04-27 | Discharge: 2023-06-09 | DRG: 885 | Disposition: A | Discharge status: Home / Self Care | Payer: MEDICAID | Source: Intra-hospital | Attending: Psychiatry | Admitting: Psychiatry

## 2023-04-27 DIAGNOSIS — F17210 Nicotine dependence, cigarettes, uncomplicated: Secondary | ICD-10-CM | POA: Diagnosis present

## 2023-04-27 DIAGNOSIS — Z5900 Homelessness unspecified: Secondary | ICD-10-CM

## 2023-04-27 DIAGNOSIS — I10 Essential (primary) hypertension: Secondary | ICD-10-CM | POA: Diagnosis present

## 2023-04-27 DIAGNOSIS — Z79899 Other long term (current) drug therapy: Secondary | ICD-10-CM | POA: Diagnosis not present

## 2023-04-27 DIAGNOSIS — Z1152 Encounter for screening for COVID-19: Secondary | ICD-10-CM | POA: Diagnosis not present

## 2023-04-27 DIAGNOSIS — G2401 Drug induced subacute dyskinesia: Secondary | ICD-10-CM | POA: Diagnosis present

## 2023-04-27 DIAGNOSIS — J449 Chronic obstructive pulmonary disease, unspecified: Secondary | ICD-10-CM | POA: Diagnosis present

## 2023-04-27 DIAGNOSIS — R251 Tremor, unspecified: Secondary | ICD-10-CM | POA: Diagnosis present

## 2023-04-27 DIAGNOSIS — Z91148 Patient's other noncompliance with medication regimen for other reason: Secondary | ICD-10-CM

## 2023-04-27 DIAGNOSIS — F200 Paranoid schizophrenia: Principal | ICD-10-CM | POA: Diagnosis present

## 2023-04-27 DIAGNOSIS — Z7982 Long term (current) use of aspirin: Secondary | ICD-10-CM | POA: Diagnosis not present

## 2023-04-27 LAB — SARS CORONAVIRUS 2 BY RT PCR: SARS Coronavirus 2 by RT PCR: NEGATIVE

## 2023-04-27 LAB — RAPID URINE DRUG SCREEN, HOSP PERFORMED
Amphetamines: NOT DETECTED
Barbiturates: NOT DETECTED
Benzodiazepines: POSITIVE — AB
Cocaine: NOT DETECTED
Opiates: NOT DETECTED
Tetrahydrocannabinol: NOT DETECTED

## 2023-04-27 MED ORDER — HYDROXYZINE HCL 25 MG PO TABS
25.0000 mg | ORAL_TABLET | Freq: Three times a day (TID) | ORAL | Status: DC | PRN
  Administered 2023-04-30 – 2023-06-04 (×24): 25 mg via ORAL
  Filled 2023-04-27 (×26): qty 1

## 2023-04-27 MED ORDER — HALOPERIDOL 5 MG PO TABS
5.0000 mg | ORAL_TABLET | Freq: Three times a day (TID) | ORAL | Status: DC | PRN
  Administered 2023-04-27 – 2023-05-09 (×5): 5 mg via ORAL
  Filled 2023-04-27 (×5): qty 1

## 2023-04-27 MED ORDER — DIPHENHYDRAMINE HCL 50 MG/ML IJ SOLN
50.0000 mg | Freq: Three times a day (TID) | INTRAMUSCULAR | Status: DC | PRN
  Administered 2023-05-19: 50 mg via INTRAMUSCULAR
  Filled 2023-04-27: qty 1

## 2023-04-27 MED ORDER — HALOPERIDOL 5 MG PO TABS
5.0000 mg | ORAL_TABLET | Freq: Two times a day (BID) | ORAL | Status: DC
Start: 2023-04-27 — End: 2023-05-02
  Administered 2023-04-27 – 2023-05-02 (×10): 5 mg via ORAL
  Filled 2023-04-27 (×10): qty 1

## 2023-04-27 MED ORDER — BENZTROPINE MESYLATE 1 MG PO TABS
1.0000 mg | ORAL_TABLET | Freq: Two times a day (BID) | ORAL | Status: DC
  Administered 2023-04-27 – 2023-05-04 (×14): 1 mg via ORAL
  Filled 2023-04-27 (×14): qty 1

## 2023-04-27 MED ORDER — LORAZEPAM 2 MG/ML IJ SOLN
2.0000 mg | Freq: Three times a day (TID) | INTRAMUSCULAR | Status: DC | PRN
  Filled 2023-04-27: qty 1

## 2023-04-27 MED ORDER — TRAZODONE HCL 100 MG PO TABS
100.0000 mg | ORAL_TABLET | Freq: Every evening | ORAL | Status: DC | PRN
Start: 2023-04-27 — End: 2023-06-09
  Administered 2023-04-27 – 2023-06-08 (×37): 100 mg via ORAL
  Filled 2023-04-27 (×38): qty 1

## 2023-04-27 MED ORDER — LORAZEPAM 2 MG/ML IJ SOLN
2.0000 mg | Freq: Three times a day (TID) | INTRAMUSCULAR | Status: DC | PRN
  Administered 2023-05-19: 2 mg via INTRAMUSCULAR
  Filled 2023-04-27: qty 1

## 2023-04-27 MED ORDER — DIVALPROEX SODIUM ER 500 MG PO TB24
500.0000 mg | ORAL_TABLET | Freq: Two times a day (BID) | ORAL | Status: DC
  Administered 2023-04-27 – 2023-06-09 (×85): 500 mg via ORAL
  Filled 2023-04-27 (×86): qty 1

## 2023-04-27 MED ORDER — ACETAMINOPHEN 325 MG PO TABS: 650.0000 mg | ORAL_TABLET | Freq: Four times a day (QID) | ORAL | Status: DC | PRN

## 2023-04-27 MED ORDER — ALUM & MAG HYDROXIDE-SIMETH 200-200-20 MG/5ML PO SUSP
30.0000 mL | ORAL | Status: DC | PRN
  Administered 2023-05-09 – 2023-06-04 (×9): 30 mL via ORAL
  Filled 2023-04-27 (×9): qty 30

## 2023-04-27 MED ORDER — DIPHENHYDRAMINE HCL 25 MG PO CAPS
50.0000 mg | ORAL_CAPSULE | Freq: Three times a day (TID) | ORAL | Status: DC | PRN
  Administered 2023-05-02 – 2023-05-09 (×4): 50 mg via ORAL
  Filled 2023-04-27 (×5): qty 2

## 2023-04-27 MED ORDER — HALOPERIDOL LACTATE 5 MG/ML IJ SOLN: 5.0000 mg | Freq: Three times a day (TID) | INTRAMUSCULAR | Status: DC | PRN

## 2023-04-27 MED ORDER — LORAZEPAM 1 MG PO TABS
1.0000 mg | ORAL_TABLET | Freq: Three times a day (TID) | ORAL | Status: DC | PRN
  Administered 2023-05-02 – 2023-05-09 (×6): 1 mg via ORAL
  Filled 2023-04-27 (×7): qty 1

## 2023-04-27 MED ORDER — MAGNESIUM HYDROXIDE 400 MG/5ML PO SUSP: 30.0000 mL | Freq: Every day | ORAL | Status: DC | PRN

## 2023-04-27 MED ORDER — GABAPENTIN 400 MG PO CAPS
400.0000 mg | ORAL_CAPSULE | Freq: Two times a day (BID) | ORAL | Status: DC
Start: 2023-04-27 — End: 2023-06-09
  Administered 2023-04-27 – 2023-06-09 (×85): 400 mg via ORAL
  Filled 2023-04-27 (×85): qty 1

## 2023-04-27 MED ORDER — HALOPERIDOL LACTATE 5 MG/ML IJ SOLN
10.0000 mg | Freq: Three times a day (TID) | INTRAMUSCULAR | Status: DC | PRN
  Administered 2023-05-08 – 2023-05-19 (×2): 10 mg via INTRAMUSCULAR
  Filled 2023-04-27 (×3): qty 2

## 2023-04-27 MED ORDER — DIPHENHYDRAMINE HCL 50 MG/ML IJ SOLN
50.0000 mg | Freq: Three times a day (TID) | INTRAMUSCULAR | Status: DC | PRN
  Filled 2023-04-27: qty 1

## 2023-04-27 NOTE — Progress Notes (Addendum)
Pt was accepted to [facility] 04/27/2023 Bed Assignment L27   Address: [address] [place], [place], [place] [address]  -[facility] Fax: [phone]  Pt meets inpatient criteria per: Alona Bene, PMHNP   Attending Physician will be Dr. Shellee Milo  Report can be called to: [phone]  Pt can arrive after Discharges   Care Team notified: Malva Limes RN, Alona Bene, PMHNP ,Latricia Heft Paramedic    [place] Ianna Salmela, MSW, [facility] 04/27/2023 10:02 AM

## 2023-04-27 NOTE — Group Note (Signed)
[facility] LCSW Group Therapy Note   Group Date: 04/27/2023 Start Time:  1:00 PM End Time:  1:30 PM   Type of Therapy/Topic:  Group Therapy:  Balance in Life  Participation Level:  Did Not Attend   Description of Group:    This group will address the concept of balance and how it feels and looks when one is unbalanced. Patients will be encouraged to process areas in their lives that are out of balance, and identify reasons for remaining unbalanced. Facilitators will guide patients utilizing problem- solving interventions to address and correct the stressor making their life unbalanced. Understanding and applying boundaries will be explored and addressed for obtaining  and maintaining a balanced life. Patients will be encouraged to explore ways to assertively make their unbalanced needs known to significant others in their lives, using other group members and facilitator for support and feedback.  Therapeutic Goals: Patient will identify two or more emotions or situations they have that consume much of in their lives. Patient will identify signs/triggers that life has become out of balance:  Patient will identify two ways to set boundaries in order to achieve balance in their lives:  Patient will demonstrate ability to communicate their needs through discussion and/or role plays  Summary of Patient Progress:    X    Therapeutic Modalities:   Cognitive Behavioral Therapy Solution-Focused Therapy Assertiveness Training   Elza Rafter, LCSWA

## 2023-04-27 NOTE — Progress Notes (Signed)
Pt observed in day room, watching tv. Patient appears anxious, tremulous, but interacted appropriately with this RN and participated in shift assessment. Patient denies SI/HI/AVH/Pain and reports his anxiety is "alright for now." Patient is med compliant, ate a snack and went to be without incident. Routine observations Q 15 mins to continue to monitor patient safety.

## 2023-04-27 NOTE — Group Note (Signed)
Date:  04/27/2023 Time:  1:35 PM  Group Topic/Focus:  Emotional Education:   The focus of this group is to discuss what feelings/emotions are, and how they are experienced. Healthy Communication:   The focus of this group is to discuss communication, barriers to communication, as well as healthy ways to communicate with others.    Participation Level:  Did Not Attend  Participation Quality:  preadmission not on unit   Affect:    Cognitive:    Insight:   Engagement in Group:    Modes of Intervention:   Additional Comments:    Alexis Frock 04/27/2023, 1:35 PM

## 2023-04-27 NOTE — ED Provider Notes (Addendum)
Emergency Medicine Observation Re-evaluation Note  Mark [name] is a 62 y.o. male, seen on rounds today.  Pt initially presented to the ED for complaints of Aggressive Behavior Currently, the patient is calm and cooperative.  Physical Exam  BP (!) 113/94 (BP Location: Left Arm)   Pulse 73   Temp 97.6 F (36.4 C) (Oral)   Resp 18   SpO2 100%  Physical Exam General: Awake. Alert. No acute distress Cardiac: Regular rate rhythm Lungs: Clear to auscultation bilaterally Psych: Calm and cooperative  ED Course / MDM  EKG:EKG Interpretation Date/Time:  Tuesday April 25 2023 11:56:36 EST Ventricular Rate:  106 PR Interval:  152 QRS Duration:  86 QT Interval:  330 QTC Calculation: 438 R Axis:   74  Text Interpretation: Sinus tachycardia Possible Anterior infarct , age undetermined rate is faster compared to Aug 2024 Confirmed by Pricilla Loveless [phone]) on 04/26/2023 7:50:00 AM  I have reviewed the labs performed to date as well as medications administered while in observation.  Recent changes in the last 24 hours include agitated episode overnight requiring IM Geodon.  Plan  Current plan is for continued boarding in the ED awaiting placement.    1359.  Patient has been accepted to [facility] geriatric unit.  Accepting physician Dr. Marlou Porch.  Stable for transport  Royanne Foots, DO 04/27/23 0900    Royanne Foots, DO 04/27/23 1400

## 2023-04-27 NOTE — Progress Notes (Signed)
Patient is a 62 year old male admitted involuntarily to the [facility] floor from [facility] with a diagnosis of paranoid schizophrenia, showing aggression and HI toward family, and not being consistent with taking medications at approx 1500. Patient presents to assessment ambulatory and is cooperative. He is A+O x 3. He currently denies SI/AVH. He does agree to contract for safety on the unit. Patient's affect is labile and speech is garbled. Patient denies anxiety and depression. He states that his main stressor is dealing with his family. Denies pain. Patient denies the use of a mobility aid at home. Denies wearing eyeglasses. Last bowel movement unknown. Denies smoking cigarettes. He denies alcohol use. Patient states that he lives with his parents. When asked what his goals he would like to work on while admitted, patient states, "to get out of here."  Skin assessment and body search completed with Alphonzo Lemmings, Charity fundraiser. Skin: warm/dry.  No contrabands found.   Emotional support and reassurance provided throughout admission intake. Consents signed. Afterwards, oriented patient to unit, room and call light, reviewed POC with all questions answered and concerns voiced. Patient verbalized understanding. Denies any needs at this time.  Will continue to monitor with ongoing Q 15 minute safety checks per unit protocol.

## 2023-04-27 NOTE — Progress Notes (Signed)
   04/27/23 1500  Psych Admission Type (Psych Patients Only)  Admission Status Involuntary  Psychosocial Assessment  Patient Complaints Shakiness  Eye Contact Fair  Facial Expression Animated  Affect Anxious  Speech Pressured  Interaction Assertive  Motor Activity Unsteady  Appearance/Hygiene Disheveled;Bizarre  Behavior Characteristics Agitated  Mood Labile;Irritable  Thought Process  Coherency Tangential  Content Preoccupation  Delusions None reported or observed  Perception WDL  Hallucination Auditory;Visual  Judgment Impaired  Confusion Mild  Danger to Self  Current suicidal ideation? Denies  Danger to Others  Danger to Others None reported or observed

## 2023-04-27 NOTE — Tx Team (Signed)
Initial Treatment Plan 04/27/2023 3:13 PM Mark [name] GNF:[id number]    PATIENT STRESSORS: Marital or family conflict   Medication change or noncompliance     PATIENT STRENGTHS: Supportive family/friends    PATIENT IDENTIFIED PROBLEMS:   "My family treats me bad and I treat them the same."                   DISCHARGE CRITERIA:  Ability to meet basic life and health needs Adequate post-discharge living arrangements Improved stabilization in mood, thinking, and/or behavior Safe-care adequate arrangements made  PRELIMINARY DISCHARGE PLAN: Attend aftercare/continuing care group Return to previous living arrangement  PATIENT/FAMILY INVOLVEMENT: This treatment plan has been presented to and reviewed with the patient, Mark [name]. The patient has been given the opportunity to ask questions and make suggestions.  Luane School, RN 04/27/2023, 3:13 PM

## 2023-04-27 NOTE — Progress Notes (Signed)
LCSW Progress Note  [id number]   Mark [name]  04/27/2023  2:12 AM    Inpatient Behavioral Health Placement  Pt meets inpatient criteria per Earney Navy, NP-PMHNP-BC. There are [facility] available beds [facility] per [facility] Rona Ravens, RN. Referral was sent to the following facilities;  Destination  Service Provider Address Phone Fax  [facility] [address] [place] [address] [phone]  [facility] Patient [facility], [place] [phone]  [facility] [address] [place] [address] [phone]  [facility] [address], [place] [address] [phone]  [facility] [address] [place], [place] [address] [phone]  [facility] [address] [place] [address] [phone]  [facility] [address] [place] [address] [phone]  [facility] [address] [place] [address] [phone]  [facility] [address], [place] [address] [phone]  [facility] [address], [place] [address] [phone]  [facility] [place] [phone]  [facility] [address] [place] [address] [phone]  [facility] EFAX [address] [place], [place] [phone]  [facility] [address] [place] [address] [phone]  [facility] [address], [place] [address] [phone]  [facility] [address] [place] [address]  [phone]  [facility] [address] [place] [address] [phone]  [facility] [address] [place] [address] [phone]  [facility] [address] [place], [place] [address] [phone]  [facility] [address] [place] [address] [phone]  [facility] [address] [place], [place] [address] [phone]  [facility] [address] [place] [address] [phone]  [facility] [address], [place] [address] [phone]  [facility] [address], [place] [address] [phone]  [facility] [address], [place] [address] [phone]  [facility] [address] [place] [address] [phone]  [facility] [place] [phone]  [facility] [address], [place] [address] [phone]  [facility] [address], [place] [address] [phone]     Situation ongoing,  CSW will follow up.    Maryjean Ka, MSW, LCSWA 04/27/2023 2:12 AM

## 2023-04-27 NOTE — Plan of Care (Signed)
  Problem: Education: Goal: Knowledge of [facility] Education information/materials will improve Outcome: Not Progressing Goal: Emotional status will improve Outcome: Not Progressing Goal: Mental status will improve Outcome: Not Progressing Goal: Verbalization of understanding the information provided will improve Outcome: Not Progressing   Problem: Education: Goal: Emotional status will improve Outcome: Not Progressing Goal: Mental status will improve Outcome: Not Progressing

## 2023-04-27 NOTE — Group Note (Signed)
Recreation Therapy Group Note   Group Topic:Emotion Expression  Group Date: 04/27/2023 Start Time: 1400 End Time: 1450 Facilitators: Rosina Lowenstein, LRT, CTRS Location:  Dayroom  Group Description: Positivity Collage. LRT and patients discussed the importance of having a positive mindset and being happy. Patients received magazines, safety scissors, a glue stick and a piece of paper. Pts were encouraged to find images or words in the magazines that showed "happiness" or positivity to them. Pt shared their collage with the group once they were finished. LRT and pts discussed how it can be difficult to always have a positive mindset, especially when they have mental health challenges.   Goal Area(s) Addressed:  Pt will identify things associate with positivity. Pt will reduce negative thinking. Pt will identify a new coping skill of thinking positive thoughts.    Affect/Mood: N/A   Participation Level: Did not attend    Clinical Observations/Individualized Feedback: Patient did not attend group due to not being on the unit yet.   Plan: Continue to engage patient in RT group sessions 2-3x/week.   Rosina Lowenstein, LRT, CTRS 04/27/2023 4:42 PM

## 2023-04-27 NOTE — ED Notes (Addendum)
Patient report was called to [place], Charity fundraiser at [facility]

## 2023-04-27 NOTE — Progress Notes (Signed)
NT reports patient monitored BP 159/136. Manual BP reading obtained by this RN 128/78. Pt asymptomatic.

## 2023-04-27 NOTE — Group Note (Signed)
Date:  04/27/2023 Time:  8:35 PM  Group Topic/Focus:  Goals Group:   The focus of this group is to help patients establish daily goals to achieve during treatment and discuss how the patient can incorporate goal setting into their daily lives to aide in recovery.    Participation Level:  Active  Participation Quality:  Appropriate  Affect:  Appropriate  Cognitive:  Appropriate  Insight: Appropriate  Engagement in Group:  Engaged  Modes of Intervention:  Discussion   Lenore Cordia 04/27/2023, 8:35 PM

## 2023-04-27 NOTE — Discharge Instructions (Signed)
Reported directly from the emergency department to [facility] for further care

## 2023-04-28 DIAGNOSIS — F200 Paranoid schizophrenia: Secondary | ICD-10-CM | POA: Diagnosis not present

## 2023-04-28 MED ORDER — VALBENAZINE TOSYLATE 40 MG PO CAPS
40.0000 mg | ORAL_CAPSULE | Freq: Every day | ORAL | Status: DC
  Administered 2023-04-28 – 2023-06-09 (×43): 40 mg via ORAL
  Filled 2023-04-28 (×43): qty 1

## 2023-04-28 MED ORDER — OLANZAPINE 5 MG PO TABS
10.0000 mg | ORAL_TABLET | Freq: Every day | ORAL | Status: DC
Start: 2023-04-28 — End: 2023-05-04
  Administered 2023-04-28 – 2023-05-03 (×6): 10 mg via ORAL
  Filled 2023-04-28 (×6): qty 2

## 2023-04-28 NOTE — Plan of Care (Signed)
  Problem: Education: Goal: Knowledge of [facility] Education information/materials will improve Outcome: Progressing Goal: Emotional status will improve Outcome: Progressing Goal: Mental status will improve Outcome: Progressing Goal: Verbalization of understanding the information provided will improve Outcome: Progressing   Problem: Education: Goal: Emotional status will improve Outcome: Progressing Goal: Mental status will improve Outcome: Progressing   Problem: Coping: Goal: Ability to verbalize frustrations and anger appropriately will improve Outcome: Progressing Goal: Ability to demonstrate self-control will improve Outcome: Progressing   Problem: Health Behavior/Discharge Planning: Goal: Identification of resources available to assist in meeting health care needs will improve Outcome: Progressing Goal: Compliance with treatment plan for underlying cause of condition will improve Outcome: Progressing   Problem: Physical Regulation: Goal: Ability to maintain clinical measurements within normal limits will improve Outcome: Progressing   Problem: Safety: Goal: Periods of time without injury will increase Outcome: Progressing

## 2023-04-28 NOTE — Progress Notes (Signed)
Patient presents to nursing station with concerns of his belongings he brought with him at admission. Patient became verbally combative and abusive. Patient threatening staff stating "I'm going to file a GD law suit against you all here for keeping my change and my clothes." Patient belongings searched by Liborio Nixon, [place] and this Clinical research associate. Pt money and wallet locked up by security. Patient went to bed without further incident. Will continue to monitor situation and patient safety.

## 2023-04-28 NOTE — Group Note (Signed)
Date:  04/28/2023 Time:  11:41 AM  Group Topic/Focus:  Making Healthy Choices:   The focus of this group is to help patients identify negative/unhealthy choices they were using prior to admission and identify positive/healthier coping strategies to replace them upon discharge.    Participation Level:  Did Not Attend   Ardelle Anton 04/28/2023, 11:41 AM

## 2023-04-28 NOTE — Progress Notes (Signed)
   04/28/23 0620  15 Minute Checks  Location Bedroom  Visual Appearance Calm  Behavior Sleeping  Sleep (Behavioral Health Patients Only)  Calculate sleep? (Click Yes once per 24 hr at 0600 safety check) Yes  Documented sleep last 24 hours 5.25

## 2023-04-28 NOTE — Plan of Care (Signed)
  Problem: Education: Goal: Emotional status will improve Outcome: Progressing Goal: Mental status will improve Outcome: Progressing   

## 2023-04-28 NOTE — BH IP Treatment Plan (Signed)
Interdisciplinary Treatment and Diagnostic Plan Update  04/28/2023 Time of Session: 10:00 AM  Mark [name] MRN: [id number]  Principal Diagnosis: Paranoid schizophrenia (HCC)  Secondary Diagnoses: Principal Problem:   Paranoid schizophrenia (HCC)   Current Medications:  Current Facility-Administered Medications  Medication Dose Route Frequency Provider Last Rate Last Admin   acetaminophen (TYLENOL) tablet 650 mg  650 mg Oral Q6H PRN Motley-Mangrum, [name], PMHNP       alum & mag hydroxide-simeth (MAALOX/MYLANTA) 200-200-20 MG/5ML suspension 30 mL  30 mL Oral Q4H PRN Motley-Mangrum, [name], PMHNP       benztropine (COGENTIN) tablet 1 mg  1 mg Oral BID Motley-Mangrum, [name], PMHNP   1 mg at 04/28/23 5621   haloperidol (HALDOL) tablet 5 mg  5 mg Oral TID PRN Motley-Mangrum, [name], PMHNP       And   diphenhydrAMINE (BENADRYL) capsule 50 mg  50 mg Oral TID PRN Motley-Mangrum, [name], PMHNP       haloperidol lactate (HALDOL) injection 10 mg  10 mg Intramuscular TID PRN Motley-Mangrum, [name], PMHNP       And   diphenhydrAMINE (BENADRYL) injection 50 mg  50 mg Intramuscular TID PRN Motley-Mangrum, [name], PMHNP       And   LORazepam (ATIVAN) injection 2 mg  2 mg Intramuscular TID PRN Motley-Mangrum, [name], PMHNP       haloperidol lactate (HALDOL) injection 5 mg  5 mg Intramuscular TID PRN Motley-Mangrum, [name], PMHNP       And   diphenhydrAMINE (BENADRYL) injection 50 mg  50 mg Intramuscular TID PRN Motley-Mangrum, [name], PMHNP       And   LORazepam (ATIVAN) injection 2 mg  2 mg Intramuscular TID PRN Motley-Mangrum, [name], PMHNP       divalproex (DEPAKOTE ER) 24 hr tablet 500 mg  500 mg Oral BID Motley-Mangrum, [name], PMHNP   500 mg at 04/28/23 0924   gabapentin (NEURONTIN) capsule 400 mg  400 mg Oral BID Motley-Mangrum, [name], PMHNP   400 mg at 04/28/23 0924   haloperidol (HALDOL) tablet 5 mg  5 mg Oral BID Motley-Mangrum, [name], PMHNP   5 mg at  04/28/23 3086   hydrOXYzine (ATARAX) tablet 25 mg  25 mg Oral TID PRN Motley-Mangrum, [name], PMHNP       LORazepam (ATIVAN) tablet 1 mg  1 mg Oral Q8H PRN Motley-Mangrum, [name], PMHNP       magnesium hydroxide (MILK OF MAGNESIA) suspension 30 mL  30 mL Oral Daily PRN Motley-Mangrum, [name], PMHNP       OLANZapine (ZYPREXA) tablet 10 mg  10 mg Oral QHS Herrick, Richard Edward, DO       traZODone (DESYREL) tablet 100 mg  100 mg Oral QHS PRN Motley-Mangrum, [name], PMHNP   100 mg at 04/27/23 2128   valbenazine ([name]) capsule 40 mg  40 mg Oral Daily [name], DO   40 mg at 04/28/23 1218   PTA Medications: Medications Prior to Admission  Medication Sig Dispense Refill Last Dose/Taking   amantadine (SYMMETREL) 100 MG capsule Take 100 mg by mouth 2 (two) times daily.      amLODipine (NORVASC) 5 MG tablet Take 1 tablet (5 mg total) by mouth daily.      aspirin EC 81 MG tablet Take 81 mg by mouth daily. Swallow whole.      benztropine (COGENTIN) 1 MG tablet Take 1 tablet (1 mg total) by mouth  2 (two) times daily. 60 tablet 0    cyclobenzaprine (FLEXERIL) 10 MG tablet Take 10 mg by mouth 2 (two) times daily as needed.      divalproex (DEPAKOTE ER) 500 MG 24 hr tablet Take 1 tablet (500 mg total) by mouth 2 (two) times daily. (Patient not taking: Reported on 04/25/2023) 60 tablet 0    gabapentin (NEURONTIN) 300 MG capsule Take 300 mg by mouth 3 (three) times daily. (Patient not taking: Reported on 04/25/2023)      gabapentin (NEURONTIN) 400 MG capsule Take 1 capsule (400 mg total) by mouth 3 (three) times daily. (Patient not taking: Reported on 04/25/2023) 90 capsule 0    haloperidol (HALDOL) 10 MG tablet Take 1 tablet (10 mg total) by mouth 2 (two) times daily. (Patient not taking: Reported on 04/25/2023)      traZODone (DESYREL) 150 MG tablet Take 1 tablet (150 mg total) by mouth at bedtime. (Patient not taking: Reported on 04/25/2023) 30 tablet 0     Patient Stressors:  Marital or family conflict   Medication change or noncompliance    Patient Strengths: Supportive family/friends   Treatment Modalities: Medication Management, Group therapy, Case management,  1 to 1 session with clinician, Psychoeducation, Recreational therapy.   Physician Treatment Plan for Primary Diagnosis: Paranoid schizophrenia (HCC) Long Term Goal(s): Improvement in symptoms so as ready for discharge   Short Term Goals: Ability to identify changes in lifestyle to reduce recurrence of condition will improve Ability to verbalize feelings will improve Ability to disclose and discuss suicidal ideas Ability to demonstrate self-control will improve Ability to identify and develop effective coping behaviors will improve Ability to maintain clinical measurements within normal limits will improve Compliance with prescribed medications will improve Ability to identify triggers associated with substance abuse/mental health issues will improve  Medication Management: Evaluate patient's response, side effects, and tolerance of medication regimen.  Therapeutic Interventions: 1 to 1 sessions, Unit Group sessions and Medication administration.  Evaluation of Outcomes: Progressing  Physician Treatment Plan for Secondary Diagnosis: Principal Problem:   Paranoid schizophrenia (HCC)  Long Term Goal(s): Improvement in symptoms so as ready for discharge   Short Term Goals: Ability to identify changes in lifestyle to reduce recurrence of condition will improve Ability to verbalize feelings will improve Ability to disclose and discuss suicidal ideas Ability to demonstrate self-control will improve Ability to identify and develop effective coping behaviors will improve Ability to maintain clinical measurements within normal limits will improve Compliance with prescribed medications will improve Ability to identify triggers associated with substance abuse/mental health issues will improve      Medication Management: Evaluate patient's response, side effects, and tolerance of medication regimen.  Therapeutic Interventions: 1 to 1 sessions, Unit Group sessions and Medication administration.  Evaluation of Outcomes: Progressing   RN Treatment Plan for Primary Diagnosis: Paranoid schizophrenia (HCC) Long Term Goal(s): Knowledge of disease and therapeutic regimen to maintain health will improve  Short Term Goals: Ability to remain free from injury will improve, Ability to verbalize frustration and anger appropriately will improve, Ability to demonstrate self-control, Ability to participate in decision making will improve, Ability to verbalize feelings will improve, Ability to disclose and discuss suicidal ideas, Ability to identify and develop effective coping behaviors will improve, and Compliance with prescribed medications will improve  Medication Management: RN will administer medications as ordered by provider, will assess and evaluate patient's response and provide education to patient for prescribed medication. RN will report any adverse and/or side effects to prescribing  provider.  Therapeutic Interventions: 1 on 1 counseling sessions, Psychoeducation, Medication administration, Evaluate responses to treatment, Monitor vital signs and CBGs as ordered, Perform/monitor CIWA, COWS, AIMS and Fall Risk screenings as ordered, Perform wound care treatments as ordered.  Evaluation of Outcomes: Progressing   LCSW Treatment Plan for Primary Diagnosis: Paranoid schizophrenia (HCC) Long Term Goal(s): Safe transition to appropriate next level of care at discharge, Engage patient in therapeutic group addressing interpersonal concerns.  Short Term Goals: Engage patient in aftercare planning with referrals and resources, Increase social support, Increase ability to appropriately verbalize feelings, Increase emotional regulation, Facilitate acceptance of mental health diagnosis and concerns,  Facilitate patient progression through stages of change regarding substance use diagnoses and concerns, Identify triggers associated with mental health/substance abuse issues, and Increase skills for wellness and recovery  Therapeutic Interventions: Assess for all discharge needs, 1 to 1 time with Social worker, Explore available resources and support systems, Assess for adequacy in community support network, Educate family and significant other(s) on suicide prevention, Complete Psychosocial Assessment, Interpersonal group therapy.  Evaluation of Outcomes: Progressing   Progress in Treatment: Attending groups: Yes. and No. Participating in groups: Yes. and No. Taking medication as prescribed: Yes. Toleration medication: Yes. Family/Significant other contact made: No, will contact:  CSW will contact if given permission  Patient understands diagnosis: Yes. Discussing patient identified problems/goals with staff: Yes. Medical problems stabilized or resolved: Yes. Denies suicidal/homicidal ideation: Yes. Issues/concerns per patient self-inventory: No. Other: None    New problem(s) identified: No, Describe:  None identified   New Short Term/Long Term Goal(s):  elimination of symptoms of psychosis, medication management for mood stabilization; elimination of SI thoughts; development of comprehensive mental wellness plan.   Patient Goals:  "To stay out of trouble and move back home"   Discharge Plan or Barriers: CSW will assist with appropriate discharge planning   Reason for Continuation of Hospitalization: Aggression Medication stabilization  Estimated Length of Stay: 1 to 7 days   Last 3 [place] Suicide Severity Risk Score: Flowsheet Row Admission (Current) from 04/27/2023 in [facility] ED from 12/31/2022 in [facility] ED from 12/29/2022 in [facility] Emergency Department [facility]  C-SSRS RISK CATEGORY No Risk No  Risk No Risk       Last [facility] 2/9 Scores:     No data to display          Scribe for Treatment Team: Elza Rafter, Theresia Majors 04/28/2023 1:38 PM

## 2023-04-28 NOTE — Progress Notes (Signed)
   04/28/23 1100  Psych Admission Type (Psych Patients Only)  Admission Status Involuntary  Psychosocial Assessment  Patient Complaints Shakiness  Eye Contact Fair  Facial Expression Animated  Affect Anxious  Speech Pressured;Slurred  Interaction Guarded  Motor Activity Tremors  Appearance/Hygiene Disheveled  Behavior Characteristics Fidgety  Mood Labile  Thought Process  Coherency Circumstantial  Content Blaming others  Delusions None reported or observed  Perception WDL  Hallucination Auditory  Judgment Impaired (limited)  Confusion Mild  Danger to Self  Current suicidal ideation? Denies  Danger to Others  Danger to Others None reported or observed

## 2023-04-28 NOTE — Group Note (Signed)
Recreation Therapy Group Note   Group Topic:General Recreation  Group Date: 04/28/2023 Start Time: 1400 End Time: 1455 Facilitators: Rosina Lowenstein, LRT, CTRS Location:  Day Room  Group Description: Bingo. LRT and patients played multiple games of Bingo with music playing in the background. LRT and pts discussed how this could be a leisure interest and the importance of doing things they enjoy post-discharge. Pts won stress balls and [employer] as Chief Financial Officer.   Goal Area(s) Addressed: Patient will identify leisure interests.  Patient will practice healthy decision making. Patient will engage in recreation activity.  Patient will increase communication.    Affect/Mood: N/A   Participation Level: Did not attend    Clinical Observations/Individualized Feedback: Rhonald did not attend group.    Plan: Continue to engage patient in RT group sessions 2-3x/week.   Rosina Lowenstein, LRT, CTRS 04/28/2023 4:41 PM

## 2023-04-28 NOTE — BHH Suicide Risk Assessment (Signed)
[facility] Admission Suicide Risk Assessment   Nursing information obtained from:  Patient Demographic factors:  Male Current Mental Status:  NA Loss Factors:  NA Historical Factors:  NA Risk Reduction Factors:  NA  Total Time spent with patient: 1 hour Principal Problem: Paranoid schizophrenia (HCC) Diagnosis:  Principal Problem:   Paranoid schizophrenia (HCC)  Subjective Data: Mark [name] is a 62 year old African-American male who is involuntarily admitted to psychiatry for aggressive behavior towards his parents.  He lives with mom and dad in [place].  He has a long history of schizophrenia.  He is a poor historian but pleasant and cooperative.  It is noted that he does have an upper extremity tremor bilaterally.  Notes reviewed states that he has been noncompliant with his medications.  I believe he follows up at [facility] outpatient.  He states that he does get a long-acting injection but does not know the name of it.  He has been in jail in the past for assault.  He denies any alcohol or drug use.  Recent outpatient medications include amantadine, Norvasc, aspirin, Cogentin, Flexeril, Depakote, Neurontin, Haldol, trazodone.  His biggest complaint is his tremors.  He denies being suicidal or homicidal.  He denies any psychosis.  He has been pleasant and cooperative throughout the interview.  He has given social work permission to talk to his father.  Continued Clinical Symptoms:  Alcohol Use Disorder Identification Test Final Score (AUDIT): 0 The "Alcohol Use Disorders Identification Test", Guidelines for Use in Primary Care, Second Edition.  World Science writer [facility]). Score between 0-7:  no or low risk or alcohol related problems. Score between 8-15:  moderate risk of alcohol related problems. Score between 16-19:  high risk of alcohol related problems. Score 20 or above:  warrants further diagnostic evaluation for alcohol dependence and treatment.   CLINICAL FACTORS:   Schizophrenia:    Paranoid or undifferentiated type   Musculoskeletal: Strength & Muscle Tone: within normal limits Gait & Station: normal Patient leans: N/A  Psychiatric Specialty Exam:  Presentation  General Appearance:  Appropriate for Environment  Eye Contact: Good  Speech: Clear and Coherent; Slow  Speech Volume: Decreased  Handedness: Right   Mood and Affect  Mood: Dysphoric  Affect: Congruent   Thought Process  Thought Processes: Coherent  Descriptions of Associations:Circumstantial  Orientation:Full (Time, Place and Person)  Thought Content:Logical  History of Schizophrenia/Schizoaffective disorder:Yes  Duration of Psychotic Symptoms:Greater than six months  Hallucinations:No data recorded Ideas of Reference:None  Suicidal Thoughts:No data recorded Homicidal Thoughts:No data recorded  Sensorium  Memory: Immediate Fair; Recent Fair; Remote Fair  Judgment: Intact  Insight: Present   Executive Functions  Concentration: Fair  Attention Span: Fair  Recall: [employer] of Knowledge: Fair  Language: Fair   Psychomotor Activity  Psychomotor Activity:No data recorded  Assets  Assets: Communication Skills; Desire for Improvement   Sleep  Sleep:No data recorded   Blood pressure 128/78, pulse 100, temperature (!) 97.5 F (36.4 C), resp. rate 18, height 5\' 11"  (1.803 m), weight 71.2 kg, SpO2 100%. Body mass index is 21.9 kg/m.   COGNITIVE FEATURES THAT CONTRIBUTE TO RISK:  None    SUICIDE RISK:   Minimal: No identifiable suicidal ideation.  Patients presenting with no risk factors but with morbid ruminations; may be classified as minimal risk based on the severity of the depressive symptoms  PLAN OF CARE: See orders  I certify that inpatient services furnished can reasonably be expected to improve the patient's condition.   Mark [name]  Mark Stankus, DO 04/28/2023, 10:42 AM

## 2023-04-28 NOTE — H&P (Signed)
Psychiatric Admission Assessment Adult  Patient Identification: Mark [name] MRN:  [id number] Date of Evaluation:  04/28/2023 Chief Complaint:  Paranoid schizophrenia (HCC) [F20.0] Principal Diagnosis: Paranoid schizophrenia (HCC) Diagnosis:  Principal Problem:   Paranoid schizophrenia (HCC)  History of Present Illness: Mark [name] is a 62 year old African-American male who is involuntarily admitted to psychiatry for aggressive behavior towards his parents.  He lives with mom and dad in [place].  He has a long history of schizophrenia.  He is a poor historian but pleasant and cooperative.  It is noted that he does have an upper extremity tremor bilaterally.  Notes reviewed states that he has been noncompliant with his medications.  I believe he follows up at [facility] outpatient.  He states that he does get a long-acting injection but does not know the name of it.  He has been in jail in the past for assault.  He denies any alcohol or drug use.  Recent outpatient medications include amantadine, Norvasc, aspirin, Cogentin, Flexeril, Depakote, Neurontin, Haldol, trazodone.  His biggest complaint is his tremors.  He denies being suicidal or homicidal.  He denies any psychosis.  He has been pleasant and cooperative throughout the interview.  He has given social work permission to talk to his father.  Associated Signs/Symptoms: Depression Symptoms: Denied (Hypo) Manic Symptoms: Denied Anxiety Symptoms:  Social Anxiety, Psychotic Symptoms:  Paranoia, PTSD Symptoms: NA Total Time spent with patient: 1 hour  Past Psychiatric History: Past admission in February 2023, admission and discharge notes were reviewed, per discharge summary patient was discharged on Depakote 500 mg twice daily, Cogentin 1 mg twice daily, Haldol 5 mg twice daily and Neurontin 400 mg 3 times daily also clozapine was discontinued during that hospitalization, aims was noted to be negative at time of discharge with no sign of EPS.  Past Psychotropic medications included clozapine, Depakote, Haldol, Cogentin, Inderal, trazodone and Vistaril. Prior Psychiatric diagnoses: Schizophrenia   Is the patient at risk to self? Yes.    Has the patient been a risk to self in the past 6 months? Yes.    Has the patient been a risk to self within the distant past? Yes.    Is the patient a risk to others? Yes.    Has the patient been a risk to others in the past 6 months? Yes.    Has the patient been a risk to others within the distant past? Yes.     [place] Scale:  Flowsheet Row Admission (Current) from 04/27/2023 in [facility] ED from 12/31/2022 in [facility] ED from 12/29/2022 in [facility] Emergency Department at [facility]  C-SSRS RISK CATEGORY No Risk No Risk No Risk        Prior Inpatient Therapy: Yes.   If yes, describe  Prior Outpatient Therapy: Yes.   If yes, describe   Alcohol Screening: 1. How often do you have a drink containing alcohol?: Never 2. How many drinks containing alcohol do you have on a typical day when you are drinking?: 1 or 2 3. How often do you have six or more drinks on one occasion?: Never AUDIT-C Score: 0 4. How often during the last year have you found that you were not able to stop drinking once you had started?: Never 5. How often during the last year have you failed to do what was normally expected from you because of drinking?: Never 6. How often during the last year have you needed a first drink in  the morning to get yourself going after a heavy drinking session?: Never 7. How often during the last year have you had a feeling of guilt of remorse after drinking?: Never 8. How often during the last year have you been unable to remember what happened the night before because you had been drinking?: Never 9. Have you or someone else been injured as a result of your drinking?: No 10. Has a relative or friend or a doctor or another  health worker been concerned about your drinking or suggested you cut down?: No Alcohol Use Disorder Identification Test Final Score (AUDIT): 0 Alcohol Brief Interventions/Follow-up: Alcohol education/Brief advice Substance Abuse History in the last 12 months:  No. Consequences of Substance Abuse: NA Previous Psychotropic Medications: Yes  Psychological Evaluations: Yes  Past Medical History:  Past Medical History:  Diagnosis Date   COPD (chronic obstructive pulmonary disease) (HCC)    Hypertension    Schizophrenia (HCC)     Past Surgical History:  Procedure Laterality Date   LOWER EXTREMITY ANGIOGRAPHY N/A 09/18/2017   Procedure: LOWER EXTREMITY ANGIOGRAPHY- Recheck lysis;  Surgeon: Maeola Harman, MD;  Location: [facility] INVASIVE CV LAB;  Service: Cardiovascular;  Laterality: N/A;   PERIPHERAL VASCULAR INTERVENTION Left 09/18/2017   Procedure: PERIPHERAL VASCULAR INTERVENTION;  Surgeon: Maeola Harman, MD;  Location: [facility] INVASIVE CV LAB;  Service: Cardiovascular;  Laterality: Left;   THROMBECTOMY FEMORAL ARTERY Left 09/17/2017   Procedure: POSSIBLE THROMBECTOMY;  Surgeon: Maeola Harman, MD;  Location: [facility] OR;  Service: Vascular;  Laterality: Left;   VENOGRAM Left 09/17/2017   Procedure: ULTRASOUND POPLITEAL ACCESS; CENTRAL VENOGRAM, IVVS, LYSIS CATHETER PLACEMENT;  Surgeon: Maeola Harman, MD;  Location: [facility] OR;  Service: Vascular;  Laterality: Left;   Family History: History reviewed. No pertinent family history. Family Psychiatric  History:  Tobacco Screening:  Social History   Tobacco Use  Smoking Status Every Day   Current packs/day: 0.50   Types: Cigarettes  Smokeless Tobacco Never    BH Tobacco Counseling     Are you interested in Tobacco Cessation Medications?  No, patient refused Counseled patient on smoking cessation:  Refused/Declined practical counseling Reason Tobacco Screening Not Completed: Patient Refused Screening        Social History:  Social History   Substance and Sexual Activity  Alcohol Use No     Social History   Substance and Sexual Activity  Drug Use Never    Additional Social History:                           Allergies:  No Known Allergies Lab Results:  Results for orders placed or performed during the hospital encounter of 04/25/23 (from the past 48 hours)  SARS Coronavirus 2 by RT PCR (hospital order, performed in [facility] hospital lab) *cepheid single result test* Anterior Nasal Swab     Status: None   Collection Time: 04/27/23  8:35 AM   Specimen: Anterior Nasal Swab  Result Value Ref Range   SARS Coronavirus 2 by RT PCR NEGATIVE NEGATIVE    Comment: (NOTE) SARS-CoV-2 target nucleic acids are NOT DETECTED.  The SARS-CoV-2 RNA is generally detectable in upper and lower respiratory specimens during the acute phase of infection. The lowest concentration of SARS-CoV-2 viral copies this assay can detect is 250 copies / mL. A negative result does not preclude SARS-CoV-2 infection and should not be used as the sole basis for treatment or other patient management decisions.  A negative result may occur with improper specimen collection / handling, submission of specimen other than nasopharyngeal swab, presence of viral mutation(s) within the areas targeted by this assay, and inadequate number of viral copies (<250 copies / mL). A negative result must be combined with clinical observations, patient history, and epidemiological information.  Fact Sheet for Patients:   RoadLapTop.co.za  Fact Sheet for Healthcare Providers: http://kim-miller.com/  This test is not yet approved or  cleared by the [place] FDA and has been authorized for detection and/or diagnosis of SARS-CoV-2 by FDA under an Emergency Use Authorization (EUA).  This EUA will remain in effect (meaning this test can be used) for the duration of  the COVID-19 declaration under Section 564(b)(1) of the Act, 21 U.S.C. section 360bbb-3(b)(1), unless the authorization is terminated or revoked sooner.  Performed at [facility], [address] [place], [place] [address]   Rapid urine drug screen (hospital performed)     Status: Abnormal   Collection Time: 04/27/23  9:09 AM  Result Value Ref Range   Opiates NONE DETECTED NONE DETECTED   Cocaine NONE DETECTED NONE DETECTED   Benzodiazepines POSITIVE (A) NONE DETECTED   Amphetamines NONE DETECTED NONE DETECTED   Tetrahydrocannabinol NONE DETECTED NONE DETECTED   Barbiturates NONE DETECTED NONE DETECTED    Comment: (NOTE) DRUG SCREEN FOR MEDICAL PURPOSES ONLY.  IF CONFIRMATION IS NEEDED FOR ANY PURPOSE, NOTIFY LAB WITHIN 5 DAYS.  LOWEST DETECTABLE LIMITS FOR URINE DRUG SCREEN Drug Class                     Cutoff (ng/mL) Amphetamine and metabolites    1000 Barbiturate and metabolites    200 Benzodiazepine                 200 Opiates and metabolites        300 Cocaine and metabolites        300 THC                            50 Performed at [facility], [address], [place], [place] [address]     Blood Alcohol level:  Lab Results  Component Value Date   [facility] <10 04/25/2023   ETH <10 12/31/2022    Metabolic Disorder Labs:  Lab Results  Component Value Date   HGBA1C 5.5 12/31/2022   MPG 111.15 12/31/2022   MPG 134.11 12/29/2021   No results found for: "PROLACTIN" Lab Results  Component Value Date   CHOL 133 12/31/2022   TRIG 39 12/31/2022   HDL 54 12/31/2022   CHOLHDL 2.5 12/31/2022   VLDL 8 12/31/2022   LDLCALC 71 12/31/2022   LDLCALC 69 12/01/2022    Current Medications: Current Facility-Administered Medications  Medication Dose Route Frequency Provider Last Rate Last Admin   acetaminophen (TYLENOL) tablet 650 mg  650 mg Oral Q6H PRN Motley-Mangrum, [name], PMHNP       alum & mag hydroxide-simeth  (MAALOX/MYLANTA) 200-200-20 MG/5ML suspension 30 mL  30 mL Oral Q4H PRN Motley-Mangrum, [name], PMHNP       benztropine (COGENTIN) tablet 1 mg  1 mg Oral BID Motley-Mangrum, [name], PMHNP   1 mg at 04/28/23 0924   haloperidol (HALDOL) tablet 5 mg  5 mg Oral TID PRN Motley-Mangrum, [name], PMHNP       And   diphenhydrAMINE (BENADRYL) capsule 50 mg  50 mg Oral TID  PRN Motley-Mangrum, Geralynn Ochs A, PMHNP       haloperidol lactate (HALDOL) injection 10 mg  10 mg Intramuscular TID PRN Motley-Mangrum, [name], PMHNP       And   diphenhydrAMINE (BENADRYL) injection 50 mg  50 mg Intramuscular TID PRN Motley-Mangrum, [name], PMHNP       And   LORazepam (ATIVAN) injection 2 mg  2 mg Intramuscular TID PRN Motley-Mangrum, [name], PMHNP       haloperidol lactate (HALDOL) injection 5 mg  5 mg Intramuscular TID PRN Motley-Mangrum, [name], PMHNP       And   diphenhydrAMINE (BENADRYL) injection 50 mg  50 mg Intramuscular TID PRN Motley-Mangrum, [name], PMHNP       And   LORazepam (ATIVAN) injection 2 mg  2 mg Intramuscular TID PRN Motley-Mangrum, [name], PMHNP       divalproex (DEPAKOTE ER) 24 hr tablet 500 mg  500 mg Oral BID Motley-Mangrum, [name], PMHNP   500 mg at 04/28/23 0924   gabapentin (NEURONTIN) capsule 400 mg  400 mg Oral BID Motley-Mangrum, [name], PMHNP   400 mg at 04/28/23 8295   haloperidol (HALDOL) tablet 5 mg  5 mg Oral BID Motley-Mangrum, [name], PMHNP   5 mg at 04/28/23 6213   hydrOXYzine (ATARAX) tablet 25 mg  25 mg Oral TID PRN Motley-Mangrum, [name], PMHNP       LORazepam (ATIVAN) tablet 1 mg  1 mg Oral Q8H PRN Motley-Mangrum, [name], PMHNP       magnesium hydroxide (MILK OF MAGNESIA) suspension 30 mL  30 mL Oral Daily PRN Motley-Mangrum, [name], PMHNP       traZODone (DESYREL) tablet 100 mg  100 mg Oral QHS PRN Motley-Mangrum, [name], PMHNP   100 mg at 04/27/23 2128   PTA Medications: Medications Prior to Admission  Medication Sig Dispense Refill Last  Dose/Taking   amantadine (SYMMETREL) 100 MG capsule Take 100 mg by mouth 2 (two) times daily.      amLODipine (NORVASC) 5 MG tablet Take 1 tablet (5 mg total) by mouth daily.      aspirin EC 81 MG tablet Take 81 mg by mouth daily. Swallow whole.      benztropine (COGENTIN) 1 MG tablet Take 1 tablet (1 mg total) by mouth 2 (two) times daily. 60 tablet 0    cyclobenzaprine (FLEXERIL) 10 MG tablet Take 10 mg by mouth 2 (two) times daily as needed.      divalproex (DEPAKOTE ER) 500 MG 24 hr tablet Take 1 tablet (500 mg total) by mouth 2 (two) times daily. (Patient not taking: Reported on 04/25/2023) 60 tablet 0    gabapentin (NEURONTIN) 300 MG capsule Take 300 mg by mouth 3 (three) times daily. (Patient not taking: Reported on 04/25/2023)      gabapentin (NEURONTIN) 400 MG capsule Take 1 capsule (400 mg total) by mouth 3 (three) times daily. (Patient not taking: Reported on 04/25/2023) 90 capsule 0    haloperidol (HALDOL) 10 MG tablet Take 1 tablet (10 mg total) by mouth 2 (two) times daily. (Patient not taking: Reported on 04/25/2023)      traZODone (DESYREL) 150 MG tablet Take 1 tablet (150 mg total) by mouth at bedtime. (Patient not taking: Reported on 04/25/2023) 30 tablet 0     Musculoskeletal: Strength & Muscle Tone: within normal limits Gait & Station: normal Patient leans: N/A            Psychiatric Specialty Exam:  Presentation  General Appearance:  Appropriate for Environment  Eye Contact: Good  Speech: Clear and Coherent; Slow  Speech Volume: Decreased  Handedness: Right   Mood and Affect  Mood: Dysphoric  Affect: Congruent   Thought Process  Thought Processes: Coherent  Duration of Psychotic Symptoms:N/A Past Diagnosis of Schizophrenia or Psychoactive disorder: Yes  Descriptions of Associations:Circumstantial  Orientation:Full (Time, Place and Person)  Thought Content:Logical  Hallucinations:No data recorded Ideas of  Reference:None  Suicidal Thoughts:No data recorded Homicidal Thoughts:No data recorded  Sensorium  Memory: Immediate Fair; Recent Fair; Remote Fair  Judgment: Intact  Insight: Present   Executive Functions  Concentration: Fair  Attention Span: Fair  Recall: [employer] of Knowledge: Fair  Language: Fair   Psychomotor Activity  Psychomotor Activity:No data recorded  Assets  Assets: Communication Skills; Desire for Improvement   Sleep  Sleep:No data recorded   Physical Exam: Physical Exam Vitals and nursing note reviewed.  Constitutional:      Appearance: Normal appearance. He is normal weight.  HENT:     Head: Normocephalic and atraumatic.     Nose: Nose normal.     Mouth/Throat:     Pharynx: Oropharynx is clear.  Eyes:     Extraocular Movements: Extraocular movements intact.     Pupils: Pupils are equal, round, and reactive to light.  Cardiovascular:     Rate and Rhythm: Normal rate and regular rhythm.     Pulses: Normal pulses.     Heart sounds: Normal heart sounds.  Pulmonary:     Effort: Pulmonary effort is normal.     Breath sounds: Normal breath sounds.  Abdominal:     General: Abdomen is flat. Bowel sounds are normal.     Palpations: Abdomen is soft.  Musculoskeletal:        General: Normal range of motion.     Cervical back: Normal range of motion and neck supple.  Skin:    General: Skin is warm and dry.  Neurological:     General: No focal deficit present.     Mental Status: He is alert and oriented to person, place, and time.  Psychiatric:        Attention and Perception: Attention and perception normal.        Mood and Affect: Mood is anxious. Affect is labile.        Speech: Speech normal.        Behavior: Behavior normal. Behavior is cooperative.        Thought Content: Thought content normal.        Cognition and Memory: Cognition and memory normal.        Judgment: Judgment is impulsive.    Review of Systems   Constitutional: Negative.   HENT: Negative.    Eyes: Negative.   Respiratory: Negative.    Cardiovascular: Negative.   Gastrointestinal: Negative.   Genitourinary: Negative.   Musculoskeletal: Negative.   Skin: Negative.   Neurological: Negative.   Endo/Heme/Allergies: Negative.   Psychiatric/Behavioral: Negative.     Blood pressure 128/78, pulse 100, temperature (!) 97.5 F (36.4 C), resp. rate 18, height 5\' 11"  (1.803 m), weight 71.2 kg, SpO2 100%. Body mass index is 21.9 kg/m.  Treatment Plan Summary: Daily contact with patient to assess and evaluate symptoms and progress in treatment, Medication management, and Plan see orders  Observation Level/Precautions:  15 minute checks  Laboratory:  CBC Chemistry Profile  Psychotherapy:    Medications:    Consultations:    Discharge Concerns:  Estimated LOS:  Other:     Physician Treatment Plan for Primary Diagnosis: Paranoid schizophrenia (HCC) Long Term Goal(s): Improvement in symptoms so as ready for discharge  Short Term Goals: Ability to identify changes in lifestyle to reduce recurrence of condition will improve, Ability to verbalize feelings will improve, Ability to disclose and discuss suicidal ideas, Ability to demonstrate self-control will improve, Ability to identify and develop effective coping behaviors will improve, Ability to maintain clinical measurements within normal limits will improve, Compliance with prescribed medications will improve, and Ability to identify triggers associated with substance abuse/mental health issues will improve  Physician Treatment Plan for Secondary Diagnosis: Principal Problem:   Paranoid schizophrenia (HCC)   I certify that inpatient services furnished can reasonably be expected to improve the patient's condition.    [name], DO 12/20/202410:29 AM

## 2023-04-28 NOTE — BH Assessment (Signed)
Recreation Therapy Notes  INPATIENT RECREATION THERAPY ASSESSMENT  Patient Details Name: Mark [name] MRN: [id number] DOB: 09-10-1960 Today's Date: 04/28/2023  Able to Participate in Assessment/Interview: No   Rosina Lowenstein 04/28/2023, 11:25 AM

## 2023-04-29 DIAGNOSIS — F200 Paranoid schizophrenia: Secondary | ICD-10-CM | POA: Diagnosis not present

## 2023-04-29 NOTE — Progress Notes (Signed)
[facility] MD Progress Note  04/29/2023 1:15 PM Mark [name]  MRN:  [id number] Subjective: Mark [name] was seen on rounds.  Since starting him on [name] his upper extremity tremors have improved.  He states that he is doing well on his current medications.  Has been pleasant and cooperative.  Nurses report no issues.  Good controls. Principal Problem: Paranoid schizophrenia (HCC) Diagnosis: Principal Problem:   Paranoid schizophrenia (HCC)  Total Time spent with patient: 15 minutes  Past Psychiatric History: History of schizophrenia  Past Medical History:  Past Medical History:  Diagnosis Date   COPD (chronic obstructive pulmonary disease) (HCC)    Hypertension    Schizophrenia (HCC)     Past Surgical History:  Procedure Laterality Date   LOWER EXTREMITY ANGIOGRAPHY N/A 09/18/2017   Procedure: LOWER EXTREMITY ANGIOGRAPHY- Recheck lysis;  Surgeon: Maeola Harman, MD;  Location: [facility] INVASIVE CV LAB;  Service: Cardiovascular;  Laterality: N/A;   PERIPHERAL VASCULAR INTERVENTION Left 09/18/2017   Procedure: PERIPHERAL VASCULAR INTERVENTION;  Surgeon: Maeola Harman, MD;  Location: [facility] INVASIVE CV LAB;  Service: Cardiovascular;  Laterality: Left;   THROMBECTOMY FEMORAL ARTERY Left 09/17/2017   Procedure: POSSIBLE THROMBECTOMY;  Surgeon: Maeola Harman, MD;  Location: [facility] OR;  Service: Vascular;  Laterality: Left;   VENOGRAM Left 09/17/2017   Procedure: ULTRASOUND POPLITEAL ACCESS; CENTRAL VENOGRAM, IVVS, LYSIS CATHETER PLACEMENT;  Surgeon: Maeola Harman, MD;  Location: [facility] OR;  Service: Vascular;  Laterality: Left;   Family History: History reviewed. No pertinent family history. Family Psychiatric  History: Unremarkable Social History:  Social History   Substance and Sexual Activity  Alcohol Use No     Social History   Substance and Sexual Activity  Drug Use Never    Social History   Socioeconomic History   Marital status: Single    Spouse  name: Not on file   Number of children: Not on file   Years of education: Not on file   Highest education level: Not on file  Occupational History   Not on file  Tobacco Use   Smoking status: Every Day    Current packs/day: 0.50    Types: Cigarettes   Smokeless tobacco: Never  Vaping Use   Vaping status: Never Used  Substance and Sexual Activity   Alcohol use: No   Drug use: Never   Sexual activity: Not Currently  Other Topics Concern   Not on file  Social History Narrative   Not on file   Social Drivers of Health   Financial Resource Strain: Not on file  Food Insecurity: No Food Insecurity (04/27/2023)   Hunger Vital Sign    Worried About Running Out of Food in the Last Year: Never true    Ran Out of Food in the Last Year: Never true  Transportation Needs: No Transportation Needs (04/27/2023)   PRAPARE - Administrator, Civil Service (Medical): No    Lack of Transportation (Non-Medical): No  Physical Activity: Not on file  Stress: Not on file  Social Connections: Not on file   Additional Social History:                         Sleep: Good  Appetite:  Good  Current Medications: Current Facility-Administered Medications  Medication Dose Route Frequency Provider Last Rate Last Admin   acetaminophen (TYLENOL) tablet 650 mg  650 mg Oral Q6H PRN Motley-Mangrum, [name], PMHNP       alum &  mag hydroxide-simeth (MAALOX/MYLANTA) 200-200-20 MG/5ML suspension 30 mL  30 mL Oral Q4H PRN Motley-Mangrum, [name], PMHNP       benztropine (COGENTIN) tablet 1 mg  1 mg Oral BID Motley-Mangrum, [name], PMHNP   1 mg at 04/29/23 1610   haloperidol (HALDOL) tablet 5 mg  5 mg Oral TID PRN Motley-Mangrum, [name], PMHNP       And   diphenhydrAMINE (BENADRYL) capsule 50 mg  50 mg Oral TID PRN Motley-Mangrum, [name], PMHNP       haloperidol lactate (HALDOL) injection 10 mg  10 mg Intramuscular TID PRN Motley-Mangrum, [name], PMHNP       And   diphenhydrAMINE  (BENADRYL) injection 50 mg  50 mg Intramuscular TID PRN Motley-Mangrum, [name], PMHNP       And   LORazepam (ATIVAN) injection 2 mg  2 mg Intramuscular TID PRN Motley-Mangrum, [name], PMHNP       haloperidol lactate (HALDOL) injection 5 mg  5 mg Intramuscular TID PRN Motley-Mangrum, [name], PMHNP       And   diphenhydrAMINE (BENADRYL) injection 50 mg  50 mg Intramuscular TID PRN Motley-Mangrum, [name], PMHNP       And   LORazepam (ATIVAN) injection 2 mg  2 mg Intramuscular TID PRN Motley-Mangrum, [name], PMHNP       divalproex (DEPAKOTE ER) 24 hr tablet 500 mg  500 mg Oral BID Motley-Mangrum, [name], PMHNP   500 mg at 04/29/23 0909   gabapentin (NEURONTIN) capsule 400 mg  400 mg Oral BID Motley-Mangrum, [name], PMHNP   400 mg at 04/29/23 0909   haloperidol (HALDOL) tablet 5 mg  5 mg Oral BID Motley-Mangrum, [name], PMHNP   5 mg at 04/29/23 9604   hydrOXYzine (ATARAX) tablet 25 mg  25 mg Oral TID PRN Motley-Mangrum, [name], PMHNP       LORazepam (ATIVAN) tablet 1 mg  1 mg Oral Q8H PRN Motley-Mangrum, [name], PMHNP       magnesium hydroxide (MILK OF MAGNESIA) suspension 30 mL  30 mL Oral Daily PRN Motley-Mangrum, [name], PMHNP       OLANZapine (ZYPREXA) tablet 10 mg  10 mg Oral QHS [name], DO   10 mg at 04/28/23 2146   traZODone (DESYREL) tablet 100 mg  100 mg Oral QHS PRN Motley-Mangrum, [name], PMHNP   100 mg at 04/27/23 2128   valbenazine ([name]) capsule 40 mg  40 mg Oral Daily [name], DO   40 mg at 04/29/23 5409    Lab Results: No results found for this or any previous visit (from the past 48 hours).  Blood Alcohol level:  Lab Results  Component Value Date   ETH <10 04/25/2023   ETH <10 12/31/2022    Metabolic Disorder Labs: Lab Results  Component Value Date   HGBA1C 5.5 12/31/2022   MPG 111.15 12/31/2022   MPG 134.11 12/29/2021   No results found for: "PROLACTIN" Lab Results  Component Value Date   CHOL 133  12/31/2022   TRIG 39 12/31/2022   HDL 54 12/31/2022   CHOLHDL 2.5 12/31/2022   VLDL 8 12/31/2022   LDLCALC 71 12/31/2022   LDLCALC 69 12/01/2022    Physical Findings: AIMS:  , ,  ,  ,    CIWA:    COWS:     Musculoskeletal: Strength & Muscle Tone: within normal limits Gait & Station: normal Patient leans: N/A  Psychiatric Specialty Exam:  Presentation  General Appearance:  Appropriate for Environment  Eye Contact: Good  Speech: Clear and Coherent; Slow  Speech Volume: Decreased  Handedness: Right   Mood and Affect  Mood: Dysphoric  Affect: Congruent   Thought Process  Thought Processes: Coherent  Descriptions of Associations:Circumstantial  Orientation:Full (Time, Place and Person)  Thought Content:Logical  History of Schizophrenia/Schizoaffective disorder:Yes  Duration of Psychotic Symptoms:Greater than six months  Hallucinations:No data recorded Ideas of Reference:None  Suicidal Thoughts:No data recorded Homicidal Thoughts:No data recorded  Sensorium  Memory: Immediate Fair; Recent Fair; Remote Fair  Judgment: Intact  Insight: Present   Executive Functions  Concentration: Fair  Attention Span: Fair  Recall: [employer] of Knowledge: Fair  Language: Fair   Psychomotor Activity  Psychomotor Activity:No data recorded  Assets  Assets: Communication Skills; Desire for Improvement   Sleep  Sleep:No data recorded    Blood pressure 130/83, pulse 65, temperature (!) 97.3 F (36.3 C), resp. rate 18, height 5\' 11"  (1.803 m), weight 71.2 kg, SpO2 99%. Body mass index is 21.9 kg/m.   Treatment Plan Summary: Daily contact with patient to assess and evaluate symptoms and progress in treatment, Medication management, and Plan continue current medications.  Dezire Turk Tresea Mall, DO 04/29/2023, 1:15 PM

## 2023-04-29 NOTE — Group Note (Signed)
Date:  04/29/2023 Time:  11:14 AM  Group Topic/Focus:  Self Care:   The focus of this group is to help patients understand the importance of self-care in order to improve or restore emotional, physical, spiritual, interpersonal, and financial health.    Participation Level:  Did Not Attend  Participation Quality:    Affect:   Cognitive:    Insight:   Engagement in Group:    Modes of Intervention:    Additional Comments:    Audon Heymann 04/29/2023, 11:14 AM

## 2023-04-29 NOTE — Plan of Care (Signed)
  Problem: Coping: Goal: Ability to demonstrate self-control will improve Outcome: Progressing   Problem: Health Behavior/Discharge Planning: Goal: Identification of resources available to assist in meeting health care needs will improve Outcome: Not Progressing Goal: Compliance with treatment plan for underlying cause of condition will improve Outcome: Not Progressing   Problem: Safety: Goal: Periods of time without injury will increase Outcome: Progressing

## 2023-04-29 NOTE — Plan of Care (Signed)
Patient alert and oriented. Flat affect. Denies anxiety and depression. Scheduled medications administered to patient, per MD orders. Support and encouragement provided.  Routine safety checks conducted every 15 minutes.  Patient informed to notify staff with problems or concerns. Denies SI/HI/AV/H and pain. Patient remains safe at this time.  Problem: Coping: Goal: Ability to verbalize frustrations and anger appropriately will improve Outcome: Progressing Goal: Ability to demonstrate self-control will improve Outcome: Progressing   Problem: Safety: Goal: Periods of time without injury will increase Outcome: Progressing

## 2023-04-29 NOTE — Plan of Care (Signed)
  Problem: Education: Goal: Knowledge of [facility] Education information/materials will improve Outcome: Progressing Goal: Emotional status will improve Outcome: Progressing Goal: Mental status will improve Outcome: Progressing Goal: Verbalization of understanding the information provided will improve Outcome: Progressing   Problem: Coping: Goal: Ability to verbalize frustrations and anger appropriately will improve Outcome: Progressing Goal: Ability to demonstrate self-control will improve Outcome: Progressing   Problem: Safety: Goal: Periods of time without injury will increase Outcome: Progressing   

## 2023-04-29 NOTE — Group Note (Signed)
Date:  04/29/2023 Time:  8:49 PM  Group Topic/Focus:  Healthy Communication:   The focus of this group is to discuss communication, barriers to communication, as well as healthy ways to communicate with others.    Participation Level:  Active  Participation Quality:  Appropriate  Affect:  Appropriate  Cognitive:  Appropriate  Insight: Good  Engagement in Group:  Engaged  Modes of Intervention:  Clarification  Additional Comments:    Burt Ek 04/29/2023, 8:49 PM

## 2023-04-29 NOTE — Progress Notes (Signed)
Patient admitted IVC on 12.19.24 for showing aggression and HI toward family.  Patient denies SI/HI/AVH although he can be heard responding to internal stimuli. No unsafe behaviors noted. Patient did not attend group but did appear for meals and snacks. Support and encouragement provided.  Q15 minute unit checks in place.

## 2023-04-30 DIAGNOSIS — F200 Paranoid schizophrenia: Secondary | ICD-10-CM | POA: Diagnosis not present

## 2023-04-30 NOTE — Progress Notes (Signed)
   04/30/23 0615  15 Minute Checks  Location Bedroom  Visual Appearance Calm  Behavior Sleeping  Sleep (Behavioral Health Patients Only)  Calculate sleep? (Click Yes once per 24 hr at 0600 safety check) Yes  Documented sleep last 24 hours 7

## 2023-04-30 NOTE — Progress Notes (Signed)
[facility] MD Progress Note  04/30/2023 1:08 PM Mark [name]  MRN:  [id number] Subjective: [name] is seen on rounds.  His tremor is much improved.  He has no complaints.  Nurses report no issues.  No side effects from his medications.  He has been pleasant and cooperative.  He has been compliant and in good controls. Principal Problem: Paranoid schizophrenia (HCC) Diagnosis: Principal Problem:   Paranoid schizophrenia (HCC)  Total Time spent with patient: 15 minutes  Past Psychiatric History: Schizophrenia  Past Medical History:  Past Medical History:  Diagnosis Date   COPD (chronic obstructive pulmonary disease) (HCC)    Hypertension    Schizophrenia (HCC)     Past Surgical History:  Procedure Laterality Date   LOWER EXTREMITY ANGIOGRAPHY N/A 09/18/2017   Procedure: LOWER EXTREMITY ANGIOGRAPHY- Recheck lysis;  Surgeon: Maeola Harman, MD;  Location: [facility] INVASIVE CV LAB;  Service: Cardiovascular;  Laterality: N/A;   PERIPHERAL VASCULAR INTERVENTION Left 09/18/2017   Procedure: PERIPHERAL VASCULAR INTERVENTION;  Surgeon: Maeola Harman, MD;  Location: [facility] INVASIVE CV LAB;  Service: Cardiovascular;  Laterality: Left;   THROMBECTOMY FEMORAL ARTERY Left 09/17/2017   Procedure: POSSIBLE THROMBECTOMY;  Surgeon: Maeola Harman, MD;  Location: [facility] OR;  Service: Vascular;  Laterality: Left;   VENOGRAM Left 09/17/2017   Procedure: ULTRASOUND POPLITEAL ACCESS; CENTRAL VENOGRAM, IVVS, LYSIS CATHETER PLACEMENT;  Surgeon: Maeola Harman, MD;  Location: [facility] OR;  Service: Vascular;  Laterality: Left;   Family History: History reviewed. No pertinent family history. Family Psychiatric  History: Unremarkable Social History:  Social History   Substance and Sexual Activity  Alcohol Use No     Social History   Substance and Sexual Activity  Drug Use Never    Social History   Socioeconomic History   Marital status: Single    Spouse name: Not on file    Number of children: Not on file   Years of education: Not on file   Highest education level: Not on file  Occupational History   Not on file  Tobacco Use   Smoking status: Every Day    Current packs/day: 0.50    Types: Cigarettes   Smokeless tobacco: Never  Vaping Use   Vaping status: Never Used  Substance and Sexual Activity   Alcohol use: No   Drug use: Never   Sexual activity: Not Currently  Other Topics Concern   Not on file  Social History Narrative   Not on file   Social Drivers of Health   Financial Resource Strain: Not on file  Food Insecurity: No Food Insecurity (04/27/2023)   Hunger Vital Sign    Worried About Running Out of Food in the Last Year: Never true    Ran Out of Food in the Last Year: Never true  Transportation Needs: No Transportation Needs (04/27/2023)   PRAPARE - Administrator, Civil Service (Medical): No    Lack of Transportation (Non-Medical): No  Physical Activity: Not on file  Stress: Not on file  Social Connections: Not on file   Additional Social History:                         Sleep: Good  Appetite:  Good  Current Medications: Current Facility-Administered Medications  Medication Dose Route Frequency Provider Last Rate Last Admin   acetaminophen (TYLENOL) tablet 650 mg  650 mg Oral Q6H PRN Motley-Mangrum, [name], PMHNP       alum & mag  hydroxide-simeth (MAALOX/MYLANTA) 200-200-20 MG/5ML suspension 30 mL  30 mL Oral Q4H PRN Motley-Mangrum, [name], PMHNP       benztropine (COGENTIN) tablet 1 mg  1 mg Oral BID Motley-Mangrum, [name], PMHNP   1 mg at 04/30/23 6063   haloperidol (HALDOL) tablet 5 mg  5 mg Oral TID PRN Motley-Mangrum, [name], PMHNP       And   diphenhydrAMINE (BENADRYL) capsule 50 mg  50 mg Oral TID PRN Motley-Mangrum, [name], PMHNP       haloperidol lactate (HALDOL) injection 10 mg  10 mg Intramuscular TID PRN Motley-Mangrum, [name], PMHNP       And   diphenhydrAMINE (BENADRYL) injection  50 mg  50 mg Intramuscular TID PRN Motley-Mangrum, [name], PMHNP       And   LORazepam (ATIVAN) injection 2 mg  2 mg Intramuscular TID PRN Motley-Mangrum, [name], PMHNP       haloperidol lactate (HALDOL) injection 5 mg  5 mg Intramuscular TID PRN Motley-Mangrum, [name], PMHNP       And   diphenhydrAMINE (BENADRYL) injection 50 mg  50 mg Intramuscular TID PRN Motley-Mangrum, [name], PMHNP       And   LORazepam (ATIVAN) injection 2 mg  2 mg Intramuscular TID PRN Motley-Mangrum, [name], PMHNP       divalproex (DEPAKOTE ER) 24 hr tablet 500 mg  500 mg Oral BID Motley-Mangrum, [name], PMHNP   500 mg at 04/30/23 0815   gabapentin (NEURONTIN) capsule 400 mg  400 mg Oral BID Motley-Mangrum, [name], PMHNP   400 mg at 04/30/23 0160   haloperidol (HALDOL) tablet 5 mg  5 mg Oral BID Motley-Mangrum, [name], PMHNP   5 mg at 04/30/23 1093   hydrOXYzine (ATARAX) tablet 25 mg  25 mg Oral TID PRN Motley-Mangrum, [name], PMHNP       LORazepam (ATIVAN) tablet 1 mg  1 mg Oral Q8H PRN Motley-Mangrum, [name], PMHNP       magnesium hydroxide (MILK OF MAGNESIA) suspension 30 mL  30 mL Oral Daily PRN Motley-Mangrum, [name], PMHNP       OLANZapine (ZYPREXA) tablet 10 mg  10 mg Oral QHS [name], DO   10 mg at 04/29/23 2125   traZODone (DESYREL) tablet 100 mg  100 mg Oral QHS PRN Motley-Mangrum, [name], PMHNP   100 mg at 04/27/23 2128   valbenazine ([name]) capsule 40 mg  40 mg Oral Daily [name], DO   40 mg at 04/30/23 2355    Lab Results: No results found for this or any previous visit (from the past 48 hours).  Blood Alcohol level:  Lab Results  Component Value Date   ETH <10 04/25/2023   ETH <10 12/31/2022    Metabolic Disorder Labs: Lab Results  Component Value Date   HGBA1C 5.5 12/31/2022   MPG 111.15 12/31/2022   MPG 134.11 12/29/2021   No results found for: "PROLACTIN" Lab Results  Component Value Date   CHOL 133 12/31/2022   TRIG 39  12/31/2022   HDL 54 12/31/2022   CHOLHDL 2.5 12/31/2022   VLDL 8 12/31/2022   LDLCALC 71 12/31/2022   LDLCALC 69 12/01/2022    Physical Findings: AIMS:  , ,  ,  ,    CIWA:    COWS:     Musculoskeletal: Strength & Muscle Tone: within normal limits Gait & Station: normal Patient leans: N/A  Psychiatric Specialty Exam:  Presentation  General Appearance:  Appropriate for Environment  Eye Contact: Good  Speech: Clear and Coherent; Slow  Speech Volume: Decreased  Handedness: Right   Mood and Affect  Mood: Dysphoric  Affect: Congruent   Thought Process  Thought Processes: Coherent  Descriptions of Associations:Circumstantial  Orientation:Full (Time, Place and Person)  Thought Content:Logical  History of Schizophrenia/Schizoaffective disorder:Yes  Duration of Psychotic Symptoms:Greater than six months  Hallucinations:No data recorded Ideas of Reference:None  Suicidal Thoughts:No data recorded Homicidal Thoughts:No data recorded  Sensorium  Memory: Immediate Fair; Recent Fair; Remote Fair  Judgment: Intact  Insight: Present   Executive Functions  Concentration: Fair  Attention Span: Fair  Recall: [employer] of Knowledge: Fair  Language: Fair   Psychomotor Activity  Psychomotor Activity:No data recorded  Assets  Assets: Communication Skills; Desire for Improvement   Sleep  Sleep:No data recorded    Blood pressure (!) 167/83, pulse 93, temperature (!) 97.4 F (36.3 C), resp. rate 18, height 5\' 11"  (1.803 m), weight 71.2 kg, SpO2 100%. Body mass index is 21.9 kg/m.   Treatment Plan Summary: Daily contact with patient to assess and evaluate symptoms and progress in treatment, Medication management, and Plan continue current medication.  Jusiah Aguayo Tresea Mall, DO 04/30/2023, 1:08 PM

## 2023-04-30 NOTE — BHH Counselor (Signed)
Adult Comprehensive Assessment  Patient ID: Mark [name], male   DOB: 05-23-1960, 62 y.o.   MRN: [id number]  Information Source: Information source: Patient  Current Stressors:  Patient states their primary concerns and needs for treatment are:: "I'm not sure. I was in jail in [facility] and they brought me here." Patient states their goals for this hospitilization and ongoing recovery are:: "I need to be on my pills" Educational / Learning stressors: Pt. has difficulties spelling Employment / Job issues: none reported Family Relationships: Pt. reports "My dad doesn't treat me fair." Financial / Lack of resources (include bankruptcy): Pt. reports "I don't know if I get money or not." Housing / Lack of housing: none reported. pt reisdes with father Physical health (include injuries & life threatening diseases): none reported Social relationships: none reported Substance abuse: Pt. denies Bereavement / Loss: none reported  Living/Environment/Situation:  Living Arrangements: Parent Living conditions (as described by patient or guardian): Home is ok/good Who else lives in the home?: Dad, brother, cousin What is atmosphere in current home: Other (Comment) (It's ok but my dad is not treating me fair sometimes)  Family History:  Marital status: Single Are you sexually active?: No What is your sexual orientation?: Heterosexual Does patient have children?: No  Childhood History:  By whom was/is the patient raised?: Both parents Additional childhood history information: Pt reports his childhood was "honest and fair." Description of patient's relationship with caregiver when they were a child: Good Patient's description of current relationship with people who raised him/her: Pt's mother is deceased. Pt. reports a good relationship with his dad as long as he treats him fair How were you disciplined when you got in trouble as a child/adolescent?: Pt denies any abue Does patient have  siblings?: Yes Number of Siblings: 2 Description of patient's current relationship with siblings: Pt reports having a good relationship with 1 brother and 1 sister Did patient suffer any verbal/emotional/physical/sexual abuse as a child?: No Did patient suffer from severe childhood neglect?: No Has patient ever been sexually abused/assaulted/raped as an adolescent or adult?: No Was the patient ever a victim of a crime or a disaster?: No Witnessed domestic violence?: No Has patient been affected by domestic violence as an adult?: No  Education:  Highest grade of school patient has completed: 9th grade Currently a student?: No  Employment/Work Situation:   What is the Longest Time Patient has Held a Job?: 2 weeks Where was the Patient Employed at that Time[place] [employer] Has Patient ever Been in the [employer]?: No  Financial Resources:   Surveyor, quantity resources: Medicaid Does patient have a Lawyer or guardian?: Yes Name of representative payee or guardian: Kadin Caisse - pt's father  Alcohol/Substance Abuse:   What has been your use of drugs/alcohol within the last 12 months?: Pt denies Alcohol/Substance Abuse Treatment Hx: Denies past history  Social Support System:   Forensic psychologist System: Good Type of faith/religion: "I believe in all Gods, not just one God."  Leisure/Recreation:      Strengths/Needs:   Patient states these barriers may affect/interfere with their treatment: none reported Patient states these barriers may affect their return to the community: none reported Other important information patient would like considered in planning for their treatment: none reported  Discharge Plan:   Does patient have access to transportation?: No Does patient have financial barriers related to discharge medications?: No (Pt reports he will be able to get his meds at d/c) Patient description of barriers related to  discharge medications: none  reported Plan for no access to transportation at discharge: CSW will assist with transportation back to [place], [place] Will patient be returning to same living situation after discharge?: Yes  Summary/Recommendations:     Patient is a 62 year old African-American from [place], [place] [facility] [place]).   He reports that he is unsure if he receives any financial assistance and that his father is his guardian (needs verification). He is involuntarily admitted to psychiatry for aggressive behavior towards his parents. He lives with mom and dad in [place]. He has a long history of schizophrenia. He is a poor historian but pleasant and cooperative.  He has a primary diagnosis of Schizophrenia.  Recommendations include: crisis stabilization, therapeutic milieu, encourage group attendance and participation, medication management for mood stabilization and development of comprehensive mental wellness plan.    Felecia Shelling Else Habermann. 04/30/2023

## 2023-04-30 NOTE — Plan of Care (Signed)
  Problem: Physical Regulation: Goal: Ability to maintain clinical measurements within normal limits will improve Outcome: Not Progressing   Problem: Safety: Goal: Periods of time without injury will increase Outcome: Progressing

## 2023-04-30 NOTE — Group Note (Signed)
[facility] LCSW Group Therapy Note   Group Date: 04/30/2023 Start Time: 1320 End Time: 1400   Type of Therapy/Topic:  Group Therapy:  Balance in Life  Participation Level:  Did Not Attend   Description of Group:    This group will address the concept of balance and how it feels and looks when one is unbalanced. Patients will be encouraged to process areas in their lives that are out of balance, and identify reasons for remaining unbalanced. Facilitators will guide patients utilizing problem- solving interventions to address and correct the stressor making their life unbalanced. Understanding and applying boundaries will be explored and addressed for obtaining  and maintaining a balanced life. Patients will be encouraged to explore ways to assertively make their unbalanced needs known to significant others in their lives, using other group members and facilitator for support and feedback.  Therapeutic Goals: Patient will identify two or more emotions or situations they have that consume much of in their lives. Patient will identify signs/triggers that life has become out of balance:  Patient will identify two ways to set boundaries in order to achieve balance in their lives:  Patient will demonstrate ability to communicate their needs through discussion and/or role plays  Summary of Patient Progress:     Did Not Attend    Therapeutic Modalities:   Cognitive Behavioral Therapy Solution-Focused Therapy Assertiveness Training   Whitney Post, LCSWA

## 2023-05-01 DIAGNOSIS — F200 Paranoid schizophrenia: Secondary | ICD-10-CM | POA: Diagnosis not present

## 2023-05-01 NOTE — Plan of Care (Signed)
  Problem: Education: Goal: Knowledge of [facility] Education information/materials will improve Outcome: Progressing Goal: Emotional status will improve Outcome: Progressing Goal: Mental status will improve Outcome: Progressing Goal: Verbalization of understanding the information provided will improve Outcome: Progressing   

## 2023-05-01 NOTE — Group Note (Signed)
Date:  05/01/2023 Time:  11:12 AM  Group Topic/Focus:  Overcoming Stress:   The focus of this group is to define stress and help patients assess their triggers.    Participation Level:  Active  Participation Quality:  Appropriate  Affect:  Angry  Cognitive:  Alert  Insight: Appropriate  Engagement in Group:  Engaged  Modes of Intervention:  Discussion  Ardelle Anton 05/01/2023, 11:12 AM

## 2023-05-01 NOTE — Group Note (Signed)
Date:  05/01/2023 Time:  12:08 AM  Group Topic/Focus:  Self Care:   The focus of this group is to help patients understand the importance of self-care in order to improve or restore emotional, physical, spiritual, interpersonal, and financial health.    Participation Level:  Active  Participation Quality:  Appropriate  Affect:  Appropriate  Cognitive:  Appropriate  Insight: Appropriate  Engagement in Group:  Engaged  Modes of Intervention:  Discussion  Additional Comments:    Burt Ek 05/01/2023, 12:08 AM

## 2023-05-01 NOTE — Group Note (Signed)
Recreation Therapy Group Note   Group Topic:Health and Wellness  Group Date: 05/01/2023 Start Time: 1100 End Time: 1145 Facilitators: Rosina Lowenstein, LRT, CTRS Location:  Dayroom  Group Description: Seated Exercise. LRT discussed the mental and physical benefits of exercise. LRT and group discussed how physical activity can be used as a coping skill. Pt's and LRT followed along to an exercise video on the TV screen that provided a visual representation and audio description of every exercise performed. Pt's encouraged to listen to their bodies and stop at any time if they experience feelings of discomfort or pain. Pts were encouraged to drink water and stay hydrated.   Goal Area(s) Addressed: Patient will learn benefits of physical activity. Patient will identify exercise as a coping skill.  Patient will follow multistep directions.   Affect/Mood: N/A   Participation Level: Did not attend    Clinical Observations/Individualized Feedback: Patient did not attend group.   Plan: Continue to engage patient in RT group sessions 2-3x/week.   Rosina Lowenstein, LRT, CTRS 05/01/2023 12:33 PM

## 2023-05-01 NOTE — Progress Notes (Signed)
   05/01/23 0715  Psych Admission Type (Psych Patients Only)  Admission Status Involuntary  Psychosocial Assessment  Patient Complaints Shakiness  Eye Contact Poor  Facial Expression Blank  Affect Anxious  Speech Pressured;Slurred  Interaction Isolative  Motor Activity Tremors  Appearance/Hygiene Disheveled  Behavior Characteristics Anxious  Mood Anxious  Thought Process  Coherency WDL  Content WDL  Delusions None reported or observed  Perception WDL  Hallucination Auditory  Judgment Impaired  Confusion Mild  Danger to Self  Current suicidal ideation? Denies  Danger to Others  Danger to Others None reported or observed

## 2023-05-01 NOTE — Group Note (Signed)
Recreation Therapy Group Note   Group Topic:Coping Skills  Group Date: 05/01/2023 Start Time: 1500 End Time: 1550 Facilitators: Rosina Lowenstein, LRT, CTRS Location:  Dayroom  Group Description: Coping A-Z. LRT and patients engage in a guided discussion on what coping skills are and gave specific examples. LRT passed out a handout labeled Coping A-Z with blank spaces beside each letter. LRT prompted patients to come up with a coping skill for each of the letters. LRT and patients went over the handout and gave ideas for each letter if anyone had any blanks left on their paper. Patients kept this handout with them that listed 26 different coping skills.   Goal Area(s) Addressed: Patients will be able to define "coping skills". Patient will identify new coping skills.  Patient will increase communication.   Affect/Mood: N/A   Participation Level: Did not attend    Clinical Observations/Individualized Feedback: Patient did not attend group.   Plan: Continue to engage patient in RT group sessions 2-3x/week.   Rosina Lowenstein, LRT, CTRS 05/01/2023 5:33 PM

## 2023-05-01 NOTE — Plan of Care (Signed)
  Problem: Education: Goal: Knowledge of [facility] Education information/materials will improve Outcome: Progressing Goal: Emotional status will improve Outcome: Progressing Goal: Mental status will improve Outcome: Progressing Goal: Verbalization of understanding the information provided will improve Outcome: Progressing   Problem: Education: Goal: Emotional status will improve Outcome: Progressing Goal: Mental status will improve Outcome: Progressing   Problem: Coping: Goal: Ability to verbalize frustrations and anger appropriately will improve Outcome: Progressing Goal: Ability to demonstrate self-control will improve Outcome: Progressing   Problem: Health Behavior/Discharge Planning: Goal: Identification of resources available to assist in meeting health care needs will improve Outcome: Progressing Goal: Compliance with treatment plan for underlying cause of condition will improve Outcome: Progressing   Problem: Physical Regulation: Goal: Ability to maintain clinical measurements within normal limits will improve Outcome: Progressing   Problem: Safety: Goal: Periods of time without injury will increase Outcome: Progressing

## 2023-05-01 NOTE — Progress Notes (Signed)
[facility] MD Progress Note  05/01/2023 1:20 PM Mark [name]  MRN:  [id number] Subjective: Mark [name] is seen on rounds.  He has been pleasant and cooperative.  He still has an upper extremity tremor but he feels like the [name] 40 mg has been helpful.  I told him there is room to move up on it.  He states that he is doing okay.  He has not spoke to his family.  His biggest problem is his mom and dad.  He lives with mom and dad, unfortunately.  He has been in good controls on the unit.  Nurses report no problems.  He has no complaints.  He denies any suicidal ideation or auditory or visual hallucinations. Principal Problem: Paranoid schizophrenia (HCC) Diagnosis: Principal Problem:   Paranoid schizophrenia (HCC)  Total Time spent with patient: 15 minutes  Past Psychiatric History: History of schizophrenia  Past Medical History:  Past Medical History:  Diagnosis Date   COPD (chronic obstructive pulmonary disease) (HCC)    Hypertension    Schizophrenia (HCC)     Past Surgical History:  Procedure Laterality Date   LOWER EXTREMITY ANGIOGRAPHY N/A 09/18/2017   Procedure: LOWER EXTREMITY ANGIOGRAPHY- Recheck lysis;  Surgeon: Maeola Harman, MD;  Location: [facility] INVASIVE CV LAB;  Service: Cardiovascular;  Laterality: N/A;   PERIPHERAL VASCULAR INTERVENTION Left 09/18/2017   Procedure: PERIPHERAL VASCULAR INTERVENTION;  Surgeon: Maeola Harman, MD;  Location: [facility] INVASIVE CV LAB;  Service: Cardiovascular;  Laterality: Left;   THROMBECTOMY FEMORAL ARTERY Left 09/17/2017   Procedure: POSSIBLE THROMBECTOMY;  Surgeon: Maeola Harman, MD;  Location: [facility] OR;  Service: Vascular;  Laterality: Left;   VENOGRAM Left 09/17/2017   Procedure: ULTRASOUND POPLITEAL ACCESS; CENTRAL VENOGRAM, IVVS, LYSIS CATHETER PLACEMENT;  Surgeon: Maeola Harman, MD;  Location: [facility] OR;  Service: Vascular;  Laterality: Left;   Family History: History reviewed. No pertinent family  history. Family Psychiatric  History: Unremarkable Social History:  Social History   Substance and Sexual Activity  Alcohol Use No     Social History   Substance and Sexual Activity  Drug Use Never    Social History   Socioeconomic History   Marital status: Single    Spouse name: Not on file   Number of children: Not on file   Years of education: Not on file   Highest education level: Not on file  Occupational History   Not on file  Tobacco Use   Smoking status: Every Day    Current packs/day: 0.50    Types: Cigarettes   Smokeless tobacco: Never  Vaping Use   Vaping status: Never Used  Substance and Sexual Activity   Alcohol use: No   Drug use: Never   Sexual activity: Not Currently  Other Topics Concern   Not on file  Social History Narrative   Not on file   Social Drivers of Health   Financial Resource Strain: Not on file  Food Insecurity: No Food Insecurity (04/27/2023)   Hunger Vital Sign    Worried About Running Out of Food in the Last Year: Never true    Ran Out of Food in the Last Year: Never true  Transportation Needs: No Transportation Needs (04/27/2023)   PRAPARE - Administrator, Civil Service (Medical): No    Lack of Transportation (Non-Medical): No  Physical Activity: Not on file  Stress: Not on file  Social Connections: Not on file   Additional Social History:  Sleep: Good  Appetite:  Good  Current Medications: Current Facility-Administered Medications  Medication Dose Route Frequency Provider Last Rate Last Admin   acetaminophen (TYLENOL) tablet 650 mg  650 mg Oral Q6H PRN Motley-Mangrum, [name], PMHNP       alum & mag hydroxide-simeth (MAALOX/MYLANTA) 200-200-20 MG/5ML suspension 30 mL  30 mL Oral Q4H PRN Motley-Mangrum, [name], PMHNP       benztropine (COGENTIN) tablet 1 mg  1 mg Oral BID Motley-Mangrum, [name], PMHNP   1 mg at 05/01/23 0947   haloperidol (HALDOL) tablet 5 mg  5 mg  Oral TID PRN Motley-Mangrum, [name], PMHNP       And   diphenhydrAMINE (BENADRYL) capsule 50 mg  50 mg Oral TID PRN Motley-Mangrum, [name], PMHNP       haloperidol lactate (HALDOL) injection 10 mg  10 mg Intramuscular TID PRN Motley-Mangrum, [name], PMHNP       And   diphenhydrAMINE (BENADRYL) injection 50 mg  50 mg Intramuscular TID PRN Motley-Mangrum, [name], PMHNP       And   LORazepam (ATIVAN) injection 2 mg  2 mg Intramuscular TID PRN Motley-Mangrum, [name], PMHNP       haloperidol lactate (HALDOL) injection 5 mg  5 mg Intramuscular TID PRN Motley-Mangrum, [name], PMHNP       And   diphenhydrAMINE (BENADRYL) injection 50 mg  50 mg Intramuscular TID PRN Motley-Mangrum, [name], PMHNP       And   LORazepam (ATIVAN) injection 2 mg  2 mg Intramuscular TID PRN Motley-Mangrum, [name], PMHNP       divalproex (DEPAKOTE ER) 24 hr tablet 500 mg  500 mg Oral BID Motley-Mangrum, [name], PMHNP   500 mg at 05/01/23 0947   gabapentin (NEURONTIN) capsule 400 mg  400 mg Oral BID Motley-Mangrum, [name], PMHNP   400 mg at 05/01/23 0947   haloperidol (HALDOL) tablet 5 mg  5 mg Oral BID Motley-Mangrum, [name], PMHNP   5 mg at 05/01/23 0947   hydrOXYzine (ATARAX) tablet 25 mg  25 mg Oral TID PRN Motley-Mangrum, [name], PMHNP   25 mg at 04/30/23 2049   LORazepam (ATIVAN) tablet 1 mg  1 mg Oral Q8H PRN Motley-Mangrum, [name], PMHNP       magnesium hydroxide (MILK OF MAGNESIA) suspension 30 mL  30 mL Oral Daily PRN Motley-Mangrum, [name], PMHNP       OLANZapine (ZYPREXA) tablet 10 mg  10 mg Oral QHS Mark [name]   10 mg at 04/30/23 2048   traZODone (DESYREL) tablet 100 mg  100 mg Oral QHS PRN Motley-Mangrum, [name], PMHNP   100 mg at 04/30/23 2048   valbenazine ([name]) capsule 40 mg  40 mg Oral Daily Mark [name]   40 mg at 05/01/23 1610    Lab Results: No results found for this or any previous visit (from the past 48 hours).  Blood Alcohol level:   Lab Results  Component Value Date   ETH <10 04/25/2023   ETH <10 12/31/2022    Metabolic Disorder Labs: Lab Results  Component Value Date   HGBA1C 5.5 12/31/2022   MPG 111.15 12/31/2022   MPG 134.11 12/29/2021   No results found for: "PROLACTIN" Lab Results  Component Value Date   CHOL 133 12/31/2022   TRIG 39 12/31/2022   HDL 54 12/31/2022   CHOLHDL 2.5 12/31/2022   VLDL 8 12/31/2022   LDLCALC 71 12/31/2022  LDLCALC 69 12/01/2022    Physical Findings: AIMS:  , ,  ,  ,    CIWA:    COWS:     Musculoskeletal: Strength & Muscle Tone: within normal limits Gait & Station: normal Patient leans: N/A  Psychiatric Specialty Exam:  Presentation  General Appearance:  Appropriate for Environment  Eye Contact: Good  Speech: Clear and Coherent; Slow  Speech Volume: Decreased  Handedness: Right   Mood and Affect  Mood: Dysphoric  Affect: Congruent   Thought Process  Thought Processes: Coherent  Descriptions of Associations:Circumstantial  Orientation:Full (Time, Place and Person)  Thought Content:Logical  History of Schizophrenia/Schizoaffective disorder:Yes  Duration of Psychotic Symptoms:Greater than six months  Hallucinations:No data recorded Ideas of Reference:None  Suicidal Thoughts:No data recorded Homicidal Thoughts:No data recorded  Sensorium  Memory: Immediate Fair; Recent Fair; Remote Fair  Judgment: Intact  Insight: Present   Executive Functions  Concentration: Fair  Attention Span: Fair  Recall: [employer] of Knowledge: Fair  Language: Fair   Psychomotor Activity  Psychomotor Activity:No data recorded  Assets  Assets: Communication Skills; Desire for Improvement   Sleep  Sleep:No data recorded   MENTAL STATUS EXAM: Patient is alert and oriented x 3, pleasant and cooperative, good eye contact, speech is normal and not pressured, mood is depressed; affect is flat; thought process: goal directed;  thought content: He denies suicidal ideation; judgment is poor, insight is poor.  Blood pressure 126/80, pulse 90, temperature (!) 97.1 F (36.2 C), resp. rate 15, height 5\' 11"  (1.803 m), weight 71.2 kg, SpO2 100%. Body mass index is 21.9 kg/m.   Treatment Plan Summary: Daily contact with patient to assess and evaluate symptoms and progress in treatment, Medication management, and Plan continue current medication.  Mcihael Hinderman [place], [name] 05/01/2023, 1:20 PM

## 2023-05-01 NOTE — Progress Notes (Signed)
   05/01/23 2000  Psych Admission Type (Psych Patients Only)  Admission Status Involuntary  Psychosocial Assessment  Patient Complaints Shakiness  Eye Contact Fair  Facial Expression Animated  Affect Anxious  Speech Slurred;Pressured  Interaction Guarded  Motor Activity Tremors  Appearance/Hygiene Disheveled  Behavior Characteristics Anxious  Mood Anxious  Thought Process  Coherency Circumstantial  Content Blaming others  Delusions None reported or observed  Perception WDL  Hallucination Auditory  Judgment Impaired  Confusion Mild  Danger to Self  Current suicidal ideation? Denies  Danger to Others  Danger to Others None reported or observed

## 2023-05-02 DIAGNOSIS — F200 Paranoid schizophrenia: Secondary | ICD-10-CM | POA: Diagnosis not present

## 2023-05-02 MED ORDER — HALOPERIDOL 5 MG PO TABS
5.0000 mg | ORAL_TABLET | Freq: Every day | ORAL | Status: DC
  Administered 2023-05-03 – 2023-05-04 (×2): 5 mg via ORAL
  Filled 2023-05-02 (×2): qty 1

## 2023-05-02 NOTE — Group Note (Signed)
Recreation Therapy Group Note   Group Topic:Leisure Education  Group Date: 05/02/2023 Start Time: 1100 End Time: 1200 Facilitators: Rosina Lowenstein, LRT, CTRS Location:  Dayroom  Group Description: Leisure. Patients were given the option to choose from singing karaoke, color, journaling, or listen to music. LRT and pts discussed the meaning of leisure, the importance of participating in leisure during their free time/when they're outside of the hospital, as well as how our leisure interests can also serve as coping skills.   Goal Area(s) Addressed:  Patient will identify a current leisure interest.  Patient will learn the definition of "leisure". Patient will practice making a positive decision. Patient will have the opportunity to try a new leisure activity. Patient will communicate with peers and LRT.    Affect/Mood: N/A   Participation Level: Did not attend    Clinical Observations/Individualized Feedback: Patient did not attend group.   Plan: Continue to engage patient in RT group sessions 2-3x/week.   Rosina Lowenstein, LRT, CTRS 05/02/2023 12:54 PM

## 2023-05-02 NOTE — Group Note (Signed)
Date:  05/02/2023 Time:  2:57 PM  Group Topic/Focus:  Holiday  Music and coloring     Participation Level:  Did Not Attend   Rodena Goldmann 05/02/2023, 2:57 PM

## 2023-05-02 NOTE — Group Note (Signed)
Date:  05/02/2023 Time:  2:13 AM  Group Topic/Focus:  Wrap-Up Group:   The focus of this group is to help patients review their daily goal of treatment and discuss progress on daily workbooks.    Participation Level:  Minimal  Participation Quality:  Inattentive  Affect:  Appropriate  Cognitive:  Oriented  Insight: Limited  Engagement in Group:  Distracting and Off Topic  Modes of Intervention:  Discussion  Additional Comments:    Mark [name] 05/02/2023, 2:13 AM

## 2023-05-02 NOTE — Plan of Care (Signed)
BASSIL BHUIYAN is a 62 y.o. male patient. No diagnosis found. Past Medical History:  Diagnosis Date   COPD (chronic obstructive pulmonary disease) (HCC)    Hypertension    Schizophrenia (HCC)    Current Facility-Administered Medications  Medication Dose Route Frequency Provider Last Rate Last Admin   acetaminophen (TYLENOL) tablet 650 mg  650 mg Oral Q6H PRN Motley-Mangrum, [name], PMHNP       alum & mag hydroxide-simeth (MAALOX/MYLANTA) 200-200-20 MG/5ML suspension 30 mL  30 mL Oral Q4H PRN Motley-Mangrum, [name], PMHNP       benztropine (COGENTIN) tablet 1 mg  1 mg Oral BID Motley-Mangrum, [name], PMHNP   1 mg at 05/02/23 0930   haloperidol (HALDOL) tablet 5 mg  5 mg Oral TID PRN Motley-Mangrum, [name], PMHNP   5 mg at 05/02/23 0102   And   diphenhydrAMINE (BENADRYL) capsule 50 mg  50 mg Oral TID PRN Motley-Mangrum, [name], PMHNP   50 mg at 05/02/23 7253   haloperidol lactate (HALDOL) injection 10 mg  10 mg Intramuscular TID PRN Motley-Mangrum, [name], PMHNP       And   diphenhydrAMINE (BENADRYL) injection 50 mg  50 mg Intramuscular TID PRN Motley-Mangrum, [name], PMHNP       And   LORazepam (ATIVAN) injection 2 mg  2 mg Intramuscular TID PRN Motley-Mangrum, [name], PMHNP       haloperidol lactate (HALDOL) injection 5 mg  5 mg Intramuscular TID PRN Motley-Mangrum, [name], PMHNP       And   diphenhydrAMINE (BENADRYL) injection 50 mg  50 mg Intramuscular TID PRN Motley-Mangrum, [name], PMHNP       And   LORazepam (ATIVAN) injection 2 mg  2 mg Intramuscular TID PRN Motley-Mangrum, [name], PMHNP       divalproex (DEPAKOTE ER) 24 hr tablet 500 mg  500 mg Oral BID Motley-Mangrum, [name], PMHNP   500 mg at 05/02/23 1718   gabapentin (NEURONTIN) capsule 400 mg  400 mg Oral BID Motley-Mangrum, [name], PMHNP   400 mg at 05/02/23 0930   [START ON 05/03/2023] haloperidol (HALDOL) tablet 5 mg  5 mg Oral Daily Lewanda Rife, MD       hydrOXYzine (ATARAX) tablet 25 mg   25 mg Oral TID PRN Motley-Mangrum, [name], PMHNP   25 mg at 04/30/23 2049   LORazepam (ATIVAN) tablet 1 mg  1 mg Oral Q8H PRN Motley-Mangrum, [name], PMHNP   1 mg at 05/02/23 6644   magnesium hydroxide (MILK OF MAGNESIA) suspension 30 mL  30 mL Oral Daily PRN Motley-Mangrum, [name], PMHNP       OLANZapine (ZYPREXA) tablet 10 mg  10 mg Oral QHS [name], DO   10 mg at 05/01/23 2143   traZODone (DESYREL) tablet 100 mg  100 mg Oral QHS PRN Motley-Mangrum, [name], PMHNP   100 mg at 05/01/23 2142   valbenazine ([name]) capsule 40 mg  40 mg Oral Daily [name], DO   40 mg at 05/02/23 0930   No Known Allergies Principal Problem:   Paranoid schizophrenia (HCC)  Blood pressure 114/71, pulse (!) 105, temperature (!) 97.4 F (36.3 C), resp. rate (!) 21, height 5\' 11"  (1.803 m), weight 71.2 kg, SpO2 100%.  Subjective Objective Assessment & Plan  Shavontae Gibeault B Ladonte [address] 05/02/2023

## 2023-05-02 NOTE — Plan of Care (Signed)
  Problem: Education: Goal: Knowledge of [facility] Education information/materials will improve Outcome: Progressing Goal: Emotional status will improve Outcome: Progressing Goal: Mental status will improve Outcome: Progressing Goal: Verbalization of understanding the information provided will improve Outcome: Progressing   

## 2023-05-02 NOTE — Progress Notes (Signed)
[facility] MD Progress Note  05/02/2023 1:48 PM Mark [name]  MRN:  [id number]  Mark [name] is a 62 year old African-American male who is involuntarily admitted to psychiatry for aggressive behavior towards his parents. He lives with mom and dad in [place]. He has a long history of schizophrenia.   Subjective: Case discussed in multidisciplinary meeting, chart reviewed, patient seen today.  Per staff report patient received as needed medicine for agitation earlier this morning.  Patient continues to report depressed and "not good" mood.  He is upset that his family.  Patient was provided with support and reassurance.  Patient denies suicidal thoughts.  Patient has paranoia about family trying to harm him.  Patient reports he is not sure if he wants to go back to his parents house upon discharge.  Patient was encouraged to work on coping strategies and a safe discharge plan.  Principal Problem: Paranoid schizophrenia (HCC) Diagnosis: Principal Problem:   Paranoid schizophrenia (HCC)   Past Psychiatric History: He has a long history of schizophrenia.  Past admission in February 2023, patient was discharged on Depakote 500 mg twice daily, Cogentin 1 mg twice daily, Haldol 5 mg twice daily and Neurontin 400 mg 3 times daily also clozapine was discontinued during that hospitalization,Past Psychotropic medications included clozapine, Depakote, Haldol, Cogentin, Inderal, trazodone and Vistaril. Prior    Past Medical History:  Past Medical History:  Diagnosis Date   COPD (chronic obstructive pulmonary disease) (HCC)    Hypertension    Schizophrenia (HCC)     Past Surgical History:  Procedure Laterality Date   LOWER EXTREMITY ANGIOGRAPHY N/A 09/18/2017   Procedure: LOWER EXTREMITY ANGIOGRAPHY- Recheck lysis;  Surgeon: Maeola Harman, MD;  Location: [facility] INVASIVE CV LAB;  Service: Cardiovascular;  Laterality: N/A;   PERIPHERAL VASCULAR INTERVENTION Left 09/18/2017   Procedure: PERIPHERAL  VASCULAR INTERVENTION;  Surgeon: Maeola Harman, MD;  Location: [facility] INVASIVE CV LAB;  Service: Cardiovascular;  Laterality: Left;   THROMBECTOMY FEMORAL ARTERY Left 09/17/2017   Procedure: POSSIBLE THROMBECTOMY;  Surgeon: Maeola Harman, MD;  Location: [facility] OR;  Service: Vascular;  Laterality: Left;   VENOGRAM Left 09/17/2017   Procedure: ULTRASOUND POPLITEAL ACCESS; CENTRAL VENOGRAM, IVVS, LYSIS CATHETER PLACEMENT;  Surgeon: Maeola Harman, MD;  Location: [facility] OR;  Service: Vascular;  Laterality: Left;    Social History:  Social History   Substance and Sexual Activity  Alcohol Use No     Social History   Substance and Sexual Activity  Drug Use Never    Social History   Socioeconomic History   Marital status: Single    Spouse name: Not on file   Number of children: Not on file   Years of education: Not on file   Highest education level: Not on file  Occupational History   Not on file  Tobacco Use   Smoking status: Every Day    Current packs/day: 0.50    Types: Cigarettes   Smokeless tobacco: Never  Vaping Use   Vaping status: Never Used  Substance and Sexual Activity   Alcohol use: No   Drug use: Never   Sexual activity: Not Currently  Other Topics Concern   Not on file  Social History Narrative   Not on file   Social Drivers of Health   Financial Resource Strain: Not on file  Food Insecurity: No Food Insecurity (04/27/2023)   Hunger Vital Sign    Worried About Running Out of Food in the Last Year: Never true    Ran Out  of Food in the Last Year: Never true  Transportation Needs: No Transportation Needs (04/27/2023)   PRAPARE - Administrator, Civil Service (Medical): No    Lack of Transportation (Non-Medical): No  Physical Activity: Not on file  Stress: Not on file  Social Connections: Not on file                           Sleep: Fair  Appetite:  Fair  Current Medications: Current Facility-Administered  Medications  Medication Dose Route Frequency Provider Last Rate Last Admin   acetaminophen (TYLENOL) tablet 650 mg  650 mg Oral Q6H PRN Motley-Mangrum, [name], PMHNP       alum & mag hydroxide-simeth (MAALOX/MYLANTA) 200-200-20 MG/5ML suspension 30 mL  30 mL Oral Q4H PRN Motley-Mangrum, [name], PMHNP       benztropine (COGENTIN) tablet 1 mg  1 mg Oral BID Motley-Mangrum, [name], PMHNP   1 mg at 05/02/23 0930   haloperidol (HALDOL) tablet 5 mg  5 mg Oral TID PRN Motley-Mangrum, [name], PMHNP   5 mg at 05/02/23 8182   And   diphenhydrAMINE (BENADRYL) capsule 50 mg  50 mg Oral TID PRN Motley-Mangrum, [name], PMHNP   50 mg at 05/02/23 9937   haloperidol lactate (HALDOL) injection 10 mg  10 mg Intramuscular TID PRN Motley-Mangrum, [name], PMHNP       And   diphenhydrAMINE (BENADRYL) injection 50 mg  50 mg Intramuscular TID PRN Motley-Mangrum, [name], PMHNP       And   LORazepam (ATIVAN) injection 2 mg  2 mg Intramuscular TID PRN Motley-Mangrum, [name], PMHNP       haloperidol lactate (HALDOL) injection 5 mg  5 mg Intramuscular TID PRN Motley-Mangrum, [name], PMHNP       And   diphenhydrAMINE (BENADRYL) injection 50 mg  50 mg Intramuscular TID PRN Motley-Mangrum, [name], PMHNP       And   LORazepam (ATIVAN) injection 2 mg  2 mg Intramuscular TID PRN Motley-Mangrum, [name], PMHNP       divalproex (DEPAKOTE ER) 24 hr tablet 500 mg  500 mg Oral BID Motley-Mangrum, [name], PMHNP   500 mg at 05/02/23 0931   gabapentin (NEURONTIN) capsule 400 mg  400 mg Oral BID Motley-Mangrum, [name], PMHNP   400 mg at 05/02/23 0930   haloperidol (HALDOL) tablet 5 mg  5 mg Oral BID Motley-Mangrum, [name], PMHNP   5 mg at 05/02/23 0930   hydrOXYzine (ATARAX) tablet 25 mg  25 mg Oral TID PRN Motley-Mangrum, [name], PMHNP   25 mg at 04/30/23 2049   LORazepam (ATIVAN) tablet 1 mg  1 mg Oral Q8H PRN Motley-Mangrum, [name], PMHNP   1 mg at 05/02/23 1696   magnesium hydroxide (MILK OF MAGNESIA)  suspension 30 mL  30 mL Oral Daily PRN Motley-Mangrum, [name], PMHNP       OLANZapine (ZYPREXA) tablet 10 mg  10 mg Oral QHS [name], DO   10 mg at 05/01/23 2143   traZODone (DESYREL) tablet 100 mg  100 mg Oral QHS PRN Motley-Mangrum, [name], PMHNP   100 mg at 05/01/23 2142   valbenazine ([name]) capsule 40 mg  40 mg Oral Daily [name], DO   40 mg at 05/02/23 0930    Lab Results: No results found for this or any previous visit (from the past 48 hours).  Blood Alcohol  level:  Lab Results  Component Value Date   ETH <10 04/25/2023   ETH <10 12/31/2022    Metabolic Disorder Labs: Lab Results  Component Value Date   HGBA1C 5.5 12/31/2022   MPG 111.15 12/31/2022   MPG 134.11 12/29/2021   No results found for: "PROLACTIN" Lab Results  Component Value Date   CHOL 133 12/31/2022   TRIG 39 12/31/2022   HDL 54 12/31/2022   CHOLHDL 2.5 12/31/2022   VLDL 8 12/31/2022   LDLCALC 71 12/31/2022   LDLCALC 69 12/01/2022     Musculoskeletal: Strength & Muscle Tone: within normal limits Gait & Station: normal Patient leans: N/A  Psychiatric Specialty Exam:  Presentation  General Appearance:  Appropriate for Environment  Eye Contact: Good  Speech: Clear and Coherent; Slow  Speech Volume: Decreased  Handedness: Right   Mood and Affect  Mood: " Not good"  Affect: Restricted and avoidant   Thought Process  Thought Processes: Goal-directed  Descriptions of Associations: Intact  Orientation:Full (Time, Place and Person)  Thought Content: Patient reports homicidal ideations towards family  History of Schizophrenia/Schizoaffective disorder:Yes  Duration of Psychotic Symptoms:Greater than six months  Hallucinations: Denies  ideas of Reference:None  Suicidal Thoughts: Denies  Homicidal Thoughts: Toward family, denies any intention or plan  Sensorium   Memory: Serial  Judgment: Limited  Insight: Limited   Executive Functions  Concentration: Fair  Attention Span: Fair  Recall: [employer] of Knowledge: Fair  Language: Fair   Psychomotor Activity  Psychomotor Activity: Decreased  Assets  Assets: Manufacturing systems engineer; Desire for Improvement   Sleep  Sleep: Fair  Physical Exam: Physical Exam Constitutional:      Appearance: Normal appearance.  HENT:     Head: Normocephalic and atraumatic.     Nose: No congestion.  Eyes:     Pupils: Pupils are equal, round, and reactive to light.  Cardiovascular:     Rate and Rhythm: Regular rhythm.  Pulmonary:     Effort: Pulmonary effort is normal.  Skin:    General: Skin is warm.  Neurological:     General: No focal deficit present.     Mental Status: He is alert and oriented to person, place, and time.    Review of Systems  Constitutional:  Negative for chills and fever.  HENT:  Negative for hearing loss and tinnitus.   Eyes:  Negative for blurred vision.  Respiratory:  Negative for cough and shortness of breath.   Cardiovascular:  Negative for chest pain and palpitations.  Gastrointestinal:  Negative for nausea and vomiting.  Psychiatric/Behavioral:  Positive for depression.    Blood pressure 114/71, pulse (!) 105, temperature (!) 97.4 F (36.3 C), resp. rate (!) 21, height 5\' 11"  (1.803 m), weight 71.2 kg, SpO2 100%. Body mass index is 21.9 kg/m.   Treatment Plan Summary: Daily contact with patient to assess and evaluate symptoms and progress in treatment and Medication management  Patient is on 2 antipsychotics Will decrease the dose of Haldol from 5 mg p.o. twice daily to 5 mg daily and then discontinue to avoid polypharmacy Will continue on olanzapine 10 mg at bedtime Will continue on [name] 40 mg p.o. daily  Lewanda Rife, MD

## 2023-05-02 NOTE — Progress Notes (Signed)
Patient was heard hitting the wall and and when the staff approached the room patient reported being agitated and was visibly having tremors. Patient received ativan 1 mg, Benadryl 50 mg and haldol 5 mg. Medications were effective.

## 2023-05-02 NOTE — Progress Notes (Signed)
Patient resting quietly in bed with eyes closed, Respirations equal and unlabored, skin warm and dry. PRN Trazodone was effective. Routine safety checks conducted according to facility protocol. Will continue to monitor for safety.

## 2023-05-03 DIAGNOSIS — F200 Paranoid schizophrenia: Secondary | ICD-10-CM | POA: Diagnosis not present

## 2023-05-03 NOTE — Group Note (Signed)
Date:  05/03/2023 Time:  11:22 AM  Group Topic/Focus:  Coloring/Word searches/Music Therapy  The purpose of this group is to allow patients to have leisure time while listening to [employer].     Participation Level:  Did Not Attend  Participation Quality:    Affect:    Cognitive:    Insight:   Engagement in Group:    Modes of Intervention:    Additional Comments:  Did not attend   Shauntee Karp T Trendon Zaring 05/03/2023, 11:22 AM

## 2023-05-03 NOTE — Plan of Care (Signed)
Patient uncooperative at times. Anxious, irritable.

## 2023-05-03 NOTE — Progress Notes (Signed)
[facility] MD Progress Note  05/03/2023 12:33 PM Mark [name]  MRN:  [id number]  Mark [name] is a 62 year old African-American male who is involuntarily admitted to psychiatry for aggressive behavior towards his parents. He lives with mom and dad in [place]. He has a long history of schizophrenia.   Subjective: Case discussed in multidisciplinary meeting, chart reviewed, patient seen today.  Per staff report patient has not been attending group.  Today patient endorsed anxious mood, he reports he gets anxious around people that is why he does not attend groups.  Patient was provided with support and reassurance.  Today patient denies suicidal thoughts.  Patient continues to have paranoia about his family trying to harm him.  Patient was encouraged to call his family if he plans to go to family house upon discharge.  Patient denies homicidal ideation.   Principal Problem: Paranoid schizophrenia (HCC) Diagnosis: Principal Problem:   Paranoid schizophrenia (HCC)   Past Psychiatric History: He has a long history of schizophrenia.  Past admission in February 2023, patient was discharged on Depakote 500 mg twice daily, Cogentin 1 mg twice daily, Haldol 5 mg twice daily and Neurontin 400 mg 3 times daily also clozapine was discontinued during that hospitalization,Past Psychotropic medications included clozapine, Depakote, Haldol, Cogentin, Inderal, trazodone and Vistaril. Prior    Past Medical History:  Past Medical History:  Diagnosis Date   COPD (chronic obstructive pulmonary disease) (HCC)    Hypertension    Schizophrenia (HCC)     Past Surgical History:  Procedure Laterality Date   LOWER EXTREMITY ANGIOGRAPHY N/A 09/18/2017   Procedure: LOWER EXTREMITY ANGIOGRAPHY- Recheck lysis;  Surgeon: Maeola Harman, MD;  Location: [facility] INVASIVE CV LAB;  Service: Cardiovascular;  Laterality: N/A;   PERIPHERAL VASCULAR INTERVENTION Left 09/18/2017   Procedure: PERIPHERAL VASCULAR INTERVENTION;   Surgeon: Maeola Harman, MD;  Location: [facility] INVASIVE CV LAB;  Service: Cardiovascular;  Laterality: Left;   THROMBECTOMY FEMORAL ARTERY Left 09/17/2017   Procedure: POSSIBLE THROMBECTOMY;  Surgeon: Maeola Harman, MD;  Location: [facility] OR;  Service: Vascular;  Laterality: Left;   VENOGRAM Left 09/17/2017   Procedure: ULTRASOUND POPLITEAL ACCESS; CENTRAL VENOGRAM, IVVS, LYSIS CATHETER PLACEMENT;  Surgeon: Maeola Harman, MD;  Location: [facility] OR;  Service: Vascular;  Laterality: Left;    Social History:  Social History   Substance and Sexual Activity  Alcohol Use No     Social History   Substance and Sexual Activity  Drug Use Never    Social History   Socioeconomic History   Marital status: Single    Spouse name: Not on file   Number of children: Not on file   Years of education: Not on file   Highest education level: Not on file  Occupational History   Not on file  Tobacco Use   Smoking status: Every Day    Current packs/day: 0.50    Types: Cigarettes   Smokeless tobacco: Never  Vaping Use   Vaping status: Never Used  Substance and Sexual Activity   Alcohol use: No   Drug use: Never   Sexual activity: Not Currently  Other Topics Concern   Not on file  Social History Narrative   Not on file   Social Drivers of Health   Financial Resource Strain: Not on file  Food Insecurity: No Food Insecurity (04/27/2023)   Hunger Vital Sign    Worried About Running Out of Food in the Last Year: Never true    Ran Out of Food in the Last  Year: Never true  Transportation Needs: No Transportation Needs (04/27/2023)   PRAPARE - Administrator, Civil Service (Medical): No    Lack of Transportation (Non-Medical): No  Physical Activity: Not on file  Stress: Not on file  Social Connections: Not on file                           Sleep: Fair  Appetite:  Fair  Current Medications: Current Facility-Administered Medications  Medication  Dose Route Frequency Provider Last Rate Last Admin   acetaminophen (TYLENOL) tablet 650 mg  650 mg Oral Q6H PRN Motley-Mangrum, [name], PMHNP       alum & mag hydroxide-simeth (MAALOX/MYLANTA) 200-200-20 MG/5ML suspension 30 mL  30 mL Oral Q4H PRN Motley-Mangrum, [name], PMHNP       benztropine (COGENTIN) tablet 1 mg  1 mg Oral BID Motley-Mangrum, [name], PMHNP   1 mg at 05/03/23 0935   haloperidol (HALDOL) tablet 5 mg  5 mg Oral TID PRN Motley-Mangrum, [name], PMHNP   5 mg at 05/02/23 0981   And   diphenhydrAMINE (BENADRYL) capsule 50 mg  50 mg Oral TID PRN Motley-Mangrum, [name], PMHNP   50 mg at 05/02/23 1914   haloperidol lactate (HALDOL) injection 10 mg  10 mg Intramuscular TID PRN Motley-Mangrum, [name], PMHNP       And   diphenhydrAMINE (BENADRYL) injection 50 mg  50 mg Intramuscular TID PRN Motley-Mangrum, [name], PMHNP       And   LORazepam (ATIVAN) injection 2 mg  2 mg Intramuscular TID PRN Motley-Mangrum, [name], PMHNP       haloperidol lactate (HALDOL) injection 5 mg  5 mg Intramuscular TID PRN Motley-Mangrum, [name], PMHNP       And   diphenhydrAMINE (BENADRYL) injection 50 mg  50 mg Intramuscular TID PRN Motley-Mangrum, [name], PMHNP       And   LORazepam (ATIVAN) injection 2 mg  2 mg Intramuscular TID PRN Motley-Mangrum, [name], PMHNP       divalproex (DEPAKOTE ER) 24 hr tablet 500 mg  500 mg Oral BID Motley-Mangrum, [name], PMHNP   500 mg at 05/03/23 0934   gabapentin (NEURONTIN) capsule 400 mg  400 mg Oral BID Motley-Mangrum, [name], PMHNP   400 mg at 05/03/23 0935   haloperidol (HALDOL) tablet 5 mg  5 mg Oral Daily Lewanda Rife, MD   5 mg at 05/03/23 0936   hydrOXYzine (ATARAX) tablet 25 mg  25 mg Oral TID PRN Motley-Mangrum, [name], PMHNP   25 mg at 05/02/23 2212   LORazepam (ATIVAN) tablet 1 mg  1 mg Oral Q8H PRN Motley-Mangrum, [name], PMHNP   1 mg at 05/02/23 7829   magnesium hydroxide (MILK OF MAGNESIA) suspension 30 mL  30 mL Oral Daily  PRN Motley-Mangrum, [name], PMHNP       OLANZapine (ZYPREXA) tablet 10 mg  10 mg Oral QHS [name], DO   10 mg at 05/02/23 2105   traZODone (DESYREL) tablet 100 mg  100 mg Oral QHS PRN Motley-Mangrum, [name], PMHNP   100 mg at 05/02/23 2212   valbenazine ([name]) capsule 40 mg  40 mg Oral Daily [name], DO   40 mg at 05/03/23 0935    Lab Results: No results found for this or any previous visit (from the past 48 hours).  Blood Alcohol level:  Lab Results  Component  Value Date   ETH <10 04/25/2023   ETH <10 12/31/2022    Metabolic Disorder Labs: Lab Results  Component Value Date   HGBA1C 5.5 12/31/2022   MPG 111.15 12/31/2022   MPG 134.11 12/29/2021   No results found for: "PROLACTIN" Lab Results  Component Value Date   CHOL 133 12/31/2022   TRIG 39 12/31/2022   HDL 54 12/31/2022   CHOLHDL 2.5 12/31/2022   VLDL 8 12/31/2022   LDLCALC 71 12/31/2022   LDLCALC 69 12/01/2022     Musculoskeletal: Strength & Muscle Tone: within normal limits Gait & Station: normal Patient leans: N/A  Psychiatric Specialty Exam:  Presentation  General Appearance:  Appropriate for Environment  Eye Contact: Good  Speech: Clear and Coherent; Slow  Speech Volume: Decreased  Handedness: Right   Mood and Affect  Mood: " Anxious"  Affect: Restricted and avoidant   Thought Process  Thought Processes: Goal-directed  Descriptions of Associations: Intact  Orientation:Full (Time, Place and Person)  Thought Content: Patient reports homicidal ideations towards family  History of Schizophrenia/Schizoaffective disorder:Yes  Duration of Psychotic Symptoms:Greater than six months  Hallucinations: Denies  ideas of Reference:None  Suicidal Thoughts: Denies  Homicidal Thoughts: Denies  Sensorium  Memory: Fair  Judgment: Limited  Insight: Advertising copywriter  Concentration: Fair  Attention  Span: Fair  Recall: [employer] of Knowledge: Fair  Language: Fair   Psychomotor Activity  Psychomotor Activity: Decreased  Assets  Assets: Manufacturing systems engineer; Desire for Improvement   Sleep  Sleep: Fair  Physical Exam: Physical Exam Constitutional:      Appearance: Normal appearance.  HENT:     Head: Normocephalic and atraumatic.     Nose: No congestion.  Eyes:     Pupils: Pupils are equal, round, and reactive to light.  Cardiovascular:     Rate and Rhythm: Regular rhythm.  Pulmonary:     Effort: Pulmonary effort is normal.  Skin:    General: Skin is warm.  Neurological:     General: No focal deficit present.     Mental Status: He is alert and oriented to person, place, and time.    Review of Systems  Constitutional:  Negative for chills and fever.  HENT:  Negative for hearing loss and tinnitus.   Eyes:  Negative for blurred vision.  Respiratory:  Negative for cough and shortness of breath.   Cardiovascular:  Negative for chest pain and palpitations.  Gastrointestinal:  Negative for nausea and vomiting.  Psychiatric/Behavioral:  Positive for depression.    Blood pressure 130/88, pulse 75, temperature 98.1 F (36.7 C), resp. rate 16, height 5\' 11"  (1.803 m), weight 71.2 kg, SpO2 100%. Body mass index is 21.9 kg/m.   Treatment Plan Summary: Daily contact with patient to assess and evaluate symptoms and progress in treatment and Medication management  Patient is on 2 antipsychotics Decrease the dose of Haldol from 5 mg p.o. twice daily to 5 mg daily, 05/02/23 and then discontinue to avoid polypharmacy Will continue on olanzapine 10 mg at bedtime Will continue on [name] 40 mg p.o. daily  Lewanda Rife, MD

## 2023-05-03 NOTE — Progress Notes (Signed)
   05/02/23 1900  Psych Admission Type (Psych Patients Only)  Admission Status Involuntary  Psychosocial Assessment  Patient Complaints Shakiness  Eye Contact Fair  Facial Expression Flat  Affect Anxious  Speech Slurred  Interaction Guarded  Motor Activity Tremors  Appearance/Hygiene Disheveled  Behavior Characteristics Anxious  Mood Anxious  Aggressive Behavior  Effect No apparent injury  Thought Process  Coherency Circumstantial  Content Preoccupation  Delusions None reported or observed  Perception WDL  Hallucination None reported or observed  Judgment WDL  Danger to Self  Current suicidal ideation? Denies  Danger to Others  Danger to Others None reported or observed

## 2023-05-03 NOTE — Progress Notes (Signed)
   05/03/23 1300  Psych Admission Type (Psych Patients Only)  Admission Status Involuntary  Psychosocial Assessment  Patient Complaints Shakiness  Eye Contact Suspiciousness  Facial Expression Angry  Affect Anxious;Angry  Speech Slurred  Interaction Guarded  Motor Activity Tremors  Appearance/Hygiene Disheveled  Behavior Characteristics Irritable;Restless  Mood Anxious  Aggressive Behavior  Effect No apparent injury  Thought Process  Coherency Circumstantial  Content Preoccupation  Delusions None reported or observed  Perception WDL  Hallucination None reported or observed  Judgment WDL  Confusion Mild  Danger to Self  Current suicidal ideation? Denies (Denies)  Danger to Others  Danger to Others None reported or observed

## 2023-05-03 NOTE — [facility] Suicide Risk Assessment (Signed)
[facility] INPATIENT:  Family/Significant Other Suicide Prevention Education  Suicide Prevention Education:  Education Completed; Mark [name] [phone]), has been identified by the patient as the family member/significant other with whom the patient will be residing, and identified as the person(s) who will aid the patient in the event of a mental health crisis (suicidal ideations/suicide attempt).  With written consent from the patient, the family member/significant other has been provided the following suicide prevention education, prior to the and/or following the discharge of the patient.  The suicide prevention education provided includes the following: Suicide risk factors Suicide prevention and interventions National Suicide Hotline telephone number [facility] assessment telephone number [facility] Emergency Assistance 911 [facility] and/or Residential Mobile Crisis Unit telephone number  Request made of family/significant other to: Remove weapons (e.g., guns, rifles, knives), all items previously/currently identified as safety concern.   Remove drugs/medications (over-the-counter, prescriptions, illicit drugs), all items previously/currently identified as a safety concern.  The family member/significant other verbalizes understanding of the suicide prevention education information provided.  The family member/significant other agrees to remove the items of safety concern listed above.  Pati shared that pt has a history of mental health issues. He stated that pt does well on his medication for awhile and then he has a big anger outburst and no one can control him. Father shared that during these times he has to go into the hospital to get stabilized. Mark [name]. However, he denied pt having any access to weapons.   Mark [name] 05/03/2023, 12:56 PM

## 2023-05-03 NOTE — BH IP Treatment Plan (Signed)
Interdisciplinary Treatment and Diagnostic Plan Update  05/03/2023 Time of Session: 10:00 Mark [name] MRN: [id number]  Principal Diagnosis: Paranoid schizophrenia [facility])  Secondary Diagnoses: Principal Problem:   Paranoid schizophrenia (HCC)   Current Medications:  Current Facility-Administered Medications  Medication Dose Route Frequency Provider Last Rate Last Admin   acetaminophen (TYLENOL) tablet 650 mg  650 mg Oral Q6H PRN Motley-Mangrum, [name], PMHNP       alum & mag hydroxide-simeth (MAALOX/MYLANTA) 200-200-20 MG/5ML suspension 30 mL  30 mL Oral Q4H PRN Motley-Mangrum, [name], PMHNP       benztropine (COGENTIN) tablet 1 mg  1 mg Oral BID Motley-Mangrum, [name], PMHNP   1 mg at 05/03/23 0935   haloperidol (HALDOL) tablet 5 mg  5 mg Oral TID PRN Motley-Mangrum, [name], PMHNP   5 mg at 05/02/23 7829   And   diphenhydrAMINE (BENADRYL) capsule 50 mg  50 mg Oral TID PRN Motley-Mangrum, [name], PMHNP   50 mg at 05/02/23 5621   haloperidol lactate (HALDOL) injection 10 mg  10 mg Intramuscular TID PRN Motley-Mangrum, [name], PMHNP       And   diphenhydrAMINE (BENADRYL) injection 50 mg  50 mg Intramuscular TID PRN Motley-Mangrum, [name], PMHNP       And   LORazepam (ATIVAN) injection 2 mg  2 mg Intramuscular TID PRN Motley-Mangrum, [name], PMHNP       haloperidol lactate (HALDOL) injection 5 mg  5 mg Intramuscular TID PRN Motley-Mangrum, [name], PMHNP       And   diphenhydrAMINE (BENADRYL) injection 50 mg  50 mg Intramuscular TID PRN Motley-Mangrum, [name], PMHNP       And   LORazepam (ATIVAN) injection 2 mg  2 mg Intramuscular TID PRN Motley-Mangrum, [name], PMHNP       divalproex (DEPAKOTE ER) 24 hr tablet 500 mg  500 mg Oral BID Motley-Mangrum, [name], PMHNP   500 mg at 05/03/23 0934   gabapentin (NEURONTIN) capsule 400 mg  400 mg Oral BID Motley-Mangrum, [name], PMHNP   400 mg at 05/03/23 0935   haloperidol (HALDOL) tablet 5 mg  5 mg Oral Daily  Lewanda Rife, MD   5 mg at 05/03/23 0936   hydrOXYzine (ATARAX) tablet 25 mg  25 mg Oral TID PRN Motley-Mangrum, [name], PMHNP   25 mg at 05/02/23 2212   LORazepam (ATIVAN) tablet 1 mg  1 mg Oral Q8H PRN Motley-Mangrum, [name], PMHNP   1 mg at 05/02/23 3086   magnesium hydroxide (MILK OF MAGNESIA) suspension 30 mL  30 mL Oral Daily PRN Motley-Mangrum, [name], PMHNP       OLANZapine (ZYPREXA) tablet 10 mg  10 mg Oral QHS [name], DO   10 mg at 05/02/23 2105   traZODone (DESYREL) tablet 100 mg  100 mg Oral QHS PRN Motley-Mangrum, [name], PMHNP   100 mg at 05/02/23 2212   valbenazine ([name]) capsule 40 mg  40 mg Oral Daily [name], DO   40 mg at 05/03/23 0935   PTA Medications: Medications Prior to Admission  Medication Sig Dispense Refill Last Dose/Taking   amantadine (SYMMETREL) 100 MG capsule Take 100 mg by mouth 2 (two) times daily.      amLODipine (NORVASC) 5 MG tablet Take 1 tablet (5 mg total) by mouth daily.      aspirin EC 81 MG tablet Take 81 mg by mouth daily. Swallow whole.      benztropine (  COGENTIN) 1 MG tablet Take 1 tablet (1 mg total) by mouth 2 (two) times daily. 60 tablet 0    cyclobenzaprine (FLEXERIL) 10 MG tablet Take 10 mg by mouth 2 (two) times daily as needed.      divalproex (DEPAKOTE ER) 500 MG 24 hr tablet Take 1 tablet (500 mg total) by mouth 2 (two) times daily. (Patient not taking: Reported on 04/25/2023) 60 tablet 0    gabapentin (NEURONTIN) 300 MG capsule Take 300 mg by mouth 3 (three) times daily. (Patient not taking: Reported on 04/25/2023)      gabapentin (NEURONTIN) 400 MG capsule Take 1 capsule (400 mg total) by mouth 3 (three) times daily. (Patient not taking: Reported on 04/25/2023) 90 capsule 0    haloperidol (HALDOL) 10 MG tablet Take 1 tablet (10 mg total) by mouth 2 (two) times daily. (Patient not taking: Reported on 04/25/2023)      traZODone (DESYREL) 150 MG tablet Take 1 tablet (150 mg total) by mouth at  bedtime. (Patient not taking: Reported on 04/25/2023) 30 tablet 0     Patient Stressors: Marital or family conflict   Medication change or noncompliance    Patient Strengths: Supportive family/friends   Treatment Modalities: Medication Management, Group therapy, Case management,  1 to 1 session with clinician, Psychoeducation, Recreational therapy.   Physician Treatment Plan for Primary Diagnosis: Paranoid schizophrenia (HCC) Long Term Goal(s): Improvement in symptoms so as ready for discharge   Short Term Goals: Ability to identify changes in lifestyle to reduce recurrence of condition will improve Ability to verbalize feelings will improve Ability to disclose and discuss suicidal ideas Ability to demonstrate self-control will improve Ability to identify and develop effective coping behaviors will improve Ability to maintain clinical measurements within normal limits will improve Compliance with prescribed medications will improve Ability to identify triggers associated with substance abuse/mental health issues will improve  Medication Management: Evaluate patient's response, side effects, and tolerance of medication regimen.  Therapeutic Interventions: 1 to 1 sessions, Unit Group sessions and Medication administration.  Evaluation of Outcomes: Progressing  Physician Treatment Plan for Secondary Diagnosis: Principal Problem:   Paranoid schizophrenia (HCC)  Long Term Goal(s): Improvement in symptoms so as ready for discharge   Short Term Goals: Ability to identify changes in lifestyle to reduce recurrence of condition will improve Ability to verbalize feelings will improve Ability to disclose and discuss suicidal ideas Ability to demonstrate self-control will improve Ability to identify and develop effective coping behaviors will improve Ability to maintain clinical measurements within normal limits will improve Compliance with prescribed medications will improve Ability to  identify triggers associated with substance abuse/mental health issues will improve     Medication Management: Evaluate patient's response, side effects, and tolerance of medication regimen.  Therapeutic Interventions: 1 to 1 sessions, Unit Group sessions and Medication administration.  Evaluation of Outcomes: Progressing   RN Treatment Plan for Primary Diagnosis: Paranoid schizophrenia (HCC) Long Term Goal(s): Knowledge of disease and therapeutic regimen to maintain health will improve  Short Term Goals: Ability to remain free from injury will improve, Ability to verbalize frustration and anger appropriately will improve, Ability to demonstrate self-control, Ability to participate in decision making will improve, Ability to verbalize feelings will improve, Ability to disclose and discuss suicidal ideas, Ability to identify and develop effective coping behaviors will improve, and Compliance with prescribed medications will improve  Medication Management: RN will administer medications as ordered by provider, will assess and evaluate patient's response and provide education to patient for  prescribed medication. RN will report any adverse and/or side effects to prescribing provider.  Therapeutic Interventions: 1 on 1 counseling sessions, Psychoeducation, Medication administration, Evaluate responses to treatment, Monitor vital signs and CBGs as ordered, Perform/monitor CIWA, COWS, AIMS and Fall Risk screenings as ordered, Perform wound care treatments as ordered.  Evaluation of Outcomes: Progressing   LCSW Treatment Plan for Primary Diagnosis: Paranoid schizophrenia (HCC) Long Term Goal(s): Safe transition to appropriate next level of care at discharge, Engage patient in therapeutic group addressing interpersonal concerns.  Short Term Goals: Engage patient in aftercare planning with referrals and resources, Increase social support, Increase ability to appropriately verbalize feelings, Increase  emotional regulation, Facilitate acceptance of mental health diagnosis and concerns, Facilitate patient progression through stages of change regarding substance use diagnoses and concerns, Identify triggers associated with mental health/substance abuse issues, and Increase skills for wellness and recovery  Therapeutic Interventions: Assess for all discharge needs, 1 to 1 time with Social worker, Explore available resources and support systems, Assess for adequacy in community support network, Educate family and significant other(s) on suicide prevention, Complete Psychosocial Assessment, Interpersonal group therapy.  Evaluation of Outcomes: Progressing   Progress in Treatment: Attending groups: Yes. and No. Participating in groups: Yes. and No. Taking medication as prescribed: Yes. Toleration medication: Yes. Family/Significant other contact made: No, will contact:  father, Najay Wesche. Patient understands diagnosis: Yes. Discussing patient identified problems/goals with staff: Yes. Medical problems stabilized or resolved: Yes. Denies suicidal/homicidal ideation: Yes. Issues/concerns per patient self-inventory: No. Other: none.  New problem(s) identified: No, Describe:  None identified 05/03/23 Update: No changes at this time.   New Short Term/Long Term Goal(s):  elimination of symptoms of psychosis, medication management for mood stabilization; elimination of SI thoughts; development of comprehensive mental wellness plan. 05/03/23 Update: No changes at this time.   Patient Goals:  "To stay out of trouble and move back home" 05/03/23 Update: No changes at this time.   Discharge Plan or Barriers: CSW will assist with appropriate discharge planning 05/03/23 Update: No changes at this time.   Reason for Continuation of Hospitalization: Aggression Medication stabilization   Estimated Length of Stay: 1 to 7 days 05/03/23 Update: No changes at this time.  Last 3 [place] Suicide  Severity Risk Score: Flowsheet Row Admission (Current) from 04/27/2023 in [facility] ED from 12/31/2022 in [facility] ED from 12/29/2022 in [facility] Emergency Department at [facility]  C-SSRS RISK CATEGORY No Risk No Risk No Risk       Last [facility] 2/9 Scores:     No data to display          Scribe for Treatment Team: Glenis Smoker, LCSW 05/03/2023 10:40 AM

## 2023-05-03 NOTE — Plan of Care (Signed)
  Problem: Education: Goal: Emotional status will improve Outcome: Progressing   Problem: Education: Goal: Knowledge of [facility] Education information/materials will improve Outcome: Progressing Goal: Emotional status will improve Outcome: Progressing Goal: Mental status will improve Outcome: Progressing Goal: Verbalization of understanding the information provided will improve Outcome: Progressing

## 2023-05-03 NOTE — Group Note (Signed)
Date:  05/03/2023 Time:  5:54 AM  Group Topic/Focus:  Rediscovering Joy:   The focus of this group is to explore various ways to relieve stress in a positive manner. Wrap-Up Group:   The focus of this group is to help patients review their daily goal of treatment and discuss progress on daily workbooks.    Participation Level:  Did Not Attend   Additional Comments:    Osker Mason 05/03/2023, 5:54 AM

## 2023-05-04 DIAGNOSIS — F200 Paranoid schizophrenia: Secondary | ICD-10-CM | POA: Diagnosis not present

## 2023-05-04 MED ORDER — BENZTROPINE MESYLATE 1 MG PO TABS: 1.0000 mg | ORAL_TABLET | Freq: Two times a day (BID) | ORAL | Status: DC

## 2023-05-04 MED ORDER — AMLODIPINE BESYLATE 5 MG PO TABS
5.0000 mg | ORAL_TABLET | Freq: Every day | ORAL | Status: DC
  Administered 2023-05-04 – 2023-06-09 (×37): 5 mg via ORAL
  Filled 2023-05-04 (×37): qty 1

## 2023-05-04 MED ORDER — OLANZAPINE 10 MG PO TABS
15.0000 mg | ORAL_TABLET | Freq: Every day | ORAL | Status: DC
  Administered 2023-05-04 – 2023-06-08 (×35): 15 mg via ORAL
  Filled 2023-05-04: qty 3
  Filled 2023-05-04 (×2): qty 1
  Filled 2023-05-04 (×2): qty 3
  Filled 2023-05-04 (×5): qty 1
  Filled 2023-05-04: qty 3
  Filled 2023-05-04 (×5): qty 1
  Filled 2023-05-04: qty 3
  Filled 2023-05-04: qty 1
  Filled 2023-05-04: qty 3
  Filled 2023-05-04 (×2): qty 1
  Filled 2023-05-04 (×3): qty 3
  Filled 2023-05-04 (×3): qty 1
  Filled 2023-05-04 (×4): qty 3
  Filled 2023-05-04: qty 1
  Filled 2023-05-04 (×2): qty 3
  Filled 2023-05-04 (×2): qty 1

## 2023-05-04 MED ORDER — AMANTADINE HCL 100 MG PO CAPS
100.0000 mg | ORAL_CAPSULE | Freq: Two times a day (BID) | ORAL | Status: DC
  Administered 2023-05-04 – 2023-06-09 (×71): 100 mg via ORAL
  Filled 2023-05-04 (×75): qty 1

## 2023-05-04 MED ORDER — ASPIRIN 81 MG PO TBEC
81.0000 mg | DELAYED_RELEASE_TABLET | Freq: Every day | ORAL | Status: DC
  Administered 2023-05-04 – 2023-06-09 (×37): 81 mg via ORAL
  Filled 2023-05-04 (×37): qty 1

## 2023-05-04 MED ORDER — CLONIDINE HCL 0.1 MG PO TABS
0.1000 mg | ORAL_TABLET | ORAL | Status: AC
  Administered 2023-05-04: 0.1 mg via ORAL
  Filled 2023-05-04: qty 1

## 2023-05-04 NOTE — Plan of Care (Signed)
  Problem: Education: Goal: Knowledge of [facility] Education information/materials will improve Outcome: Progressing Goal: Emotional status will improve Outcome: Progressing Goal: Mental status will improve Outcome: Progressing Goal: Verbalization of understanding the information provided will improve Outcome: Progressing   Problem: Education: Goal: Emotional status will improve Outcome: Progressing Goal: Mental status will improve Outcome: Progressing   Problem: Coping: Goal: Ability to verbalize frustrations and anger appropriately will improve Outcome: Progressing Goal: Ability to demonstrate self-control will improve Outcome: Progressing   Problem: Health Behavior/Discharge Planning: Goal: Identification of resources available to assist in meeting health care needs will improve Outcome: Progressing Goal: Compliance with treatment plan for underlying cause of condition will improve Outcome: Progressing   Problem: Physical Regulation: Goal: Ability to maintain clinical measurements within normal limits will improve Outcome: Progressing   Problem: Safety: Goal: Periods of time without injury will increase Outcome: Progressing

## 2023-05-04 NOTE — Group Note (Signed)
Recreation Therapy Group Note   Group Topic:Relaxation  Group Date: 05/04/2023 Start Time: 1100 End Time: 1145 Facilitators: Rosina Lowenstein, LRT, CTRS Location:  Dayroom  Group Description: PMR (Progressive Muscle Relaxation). LRT educates patients on what PMR is and the benefits that come from it. Patients are asked to sit with their feet flat on the floor while sitting up and all the way back in their chair, if possible. LRT and pts follow a prompt through a speaker that requires you to tense and release different muscles in their body and focus on their breathing. During session, lights are off and soft music is being played. Pts are given a stress ball to use if needed.   Goal Area(s) Addressed:  Patients will be able to describe progressive muscle relaxation.  Patient will practice using relaxation technique. Patient will identify a new coping skill.  Patient will follow multistep directions to reduce anxiety and stress.   Affect/Mood: N/A   Participation Level: Did not attend    Clinical Observations/Individualized Feedback: Patient did not attend group.   Plan: Continue to engage patient in RT group sessions 2-3x/week.   Rosina Lowenstein, LRT, CTRS 05/04/2023 12:45 PM

## 2023-05-04 NOTE — Group Note (Signed)
Recreation Therapy Group Note   Group Topic:Self-Esteem  Group Date: 05/04/2023 Start Time: 1500 End Time: 1545 Facilitators: Rosina Lowenstein, LRT, CTRS Location:  Dayroom  Group Description: My strengths and Qualities. Patients and LRT discussed the importance of self-love/self-esteem and things that cause it to fluctuate, including our mental health or state. Pt completed a worksheet that helps them identify 24 different strengths and qualities about themselves. Pt encouraged to read aloud at least 3 off their sheet to the group. LRT and pts discussed how this can be applied to daily life post-discharge.    Goal Area(s) Addressed: Patient will identify positive qualities about themselves. Patient will learn new positive affirmations.  Patient will recite positive qualities and affirmations aloud to the group.  Patient will practice positive self-talk.  Patient will increase communication.    Affect/Mood: N/A   Participation Level: Did not attend    Clinical Observations/Individualized Feedback: Patient did not attend group.  Plan: Continue to engage patient in RT group sessions 2-3x/week.   Rosina Lowenstein, LRT, CTRS 05/04/2023 5:13 PM

## 2023-05-04 NOTE — Group Note (Signed)
Date:  05/04/2023 Time:  10:39 PM  Group Topic/Focus:  Coping With Mental Health Crisis:   The purpose of this group is to help patients identify strategies for coping with mental health crisis.  Group discusses possible causes of crisis and ways to manage them effectively.    Participation Level:  Active  Participation Quality:  Appropriate  Affect:  Appropriate  Cognitive:  Appropriate  Insight: Appropriate  Engagement in Group:  Engaged  Modes of Intervention:  Discussion  Additional Comments:    Garry Heater 05/04/2023, 10:39 PM

## 2023-05-04 NOTE — Progress Notes (Signed)
   05/03/23 1920  Psych Admission Type (Psych Patients Only)  Admission Status Involuntary  Psychosocial Assessment  Patient Complaints Shakiness  Eye Contact Fair  Facial Expression Flat  Affect Anxious  Speech Slurred  Interaction Guarded  Motor Activity Tremors  Appearance/Hygiene Disheveled  Behavior Characteristics Irritable  Mood Anxious  Aggressive Behavior  Effect No apparent injury  Thought Process  Coherency Circumstantial  Content Preoccupation  Delusions None reported or observed  Perception WDL  Hallucination None reported or observed  Judgment WDL  Confusion Mild  Danger to Self  Current suicidal ideation? Denies  Danger to Others  Danger to Others None reported or observed

## 2023-05-04 NOTE — Progress Notes (Signed)
[facility] MD Progress Note  05/04/2023 11:59 AM Mark [name]  MRN:  [id number]  Mark [name] is a 62 year old African-American male who is involuntarily admitted to psychiatry for aggressive behavior towards his parents. He lives with mom and dad in [place]. He has a long history of schizophrenia.   Subjective: Case discussed in multidisciplinary meeting, chart reviewed, patient seen today.  Staff reported that patient has been delusional and responding to internal stimuli.  Today during assessment patient was little agitated.  Patient said that people are looking on his food, but the question was if people, patient mentioned that the the man with a knife.  Patient denies auditory visual hallucination, but has been observed responding to internal stimuli.  Patient today reports that he has been staying in his room because "groups make no sense to him".  Patient was provided with support and reassurance.  Today patient denies suicidal thoughts.   Patient denies homicidal ideation.   Principal Problem: Paranoid schizophrenia (HCC) Diagnosis: Principal Problem:   Paranoid schizophrenia (HCC)   Past Psychiatric History: He has a long history of schizophrenia.  Past admission in February 2023, patient was discharged on Depakote 500 mg twice daily, Cogentin 1 mg twice daily, Haldol 5 mg twice daily and Neurontin 400 mg 3 times daily also clozapine was discontinued during that hospitalization,Past Psychotropic medications included clozapine, Depakote, Haldol, Cogentin, Inderal, trazodone and Vistaril. Prior    Past Medical History:  Past Medical History:  Diagnosis Date   COPD (chronic obstructive pulmonary disease) (HCC)    Hypertension    Schizophrenia (HCC)     Past Surgical History:  Procedure Laterality Date   LOWER EXTREMITY ANGIOGRAPHY N/A 09/18/2017   Procedure: LOWER EXTREMITY ANGIOGRAPHY- Recheck lysis;  Surgeon: Maeola Harman, MD;  Location: [facility] INVASIVE CV LAB;  Service:  Cardiovascular;  Laterality: N/A;   PERIPHERAL VASCULAR INTERVENTION Left 09/18/2017   Procedure: PERIPHERAL VASCULAR INTERVENTION;  Surgeon: Maeola Harman, MD;  Location: [facility] INVASIVE CV LAB;  Service: Cardiovascular;  Laterality: Left;   THROMBECTOMY FEMORAL ARTERY Left 09/17/2017   Procedure: POSSIBLE THROMBECTOMY;  Surgeon: Maeola Harman, MD;  Location: [facility] OR;  Service: Vascular;  Laterality: Left;   VENOGRAM Left 09/17/2017   Procedure: ULTRASOUND POPLITEAL ACCESS; CENTRAL VENOGRAM, IVVS, LYSIS CATHETER PLACEMENT;  Surgeon: Maeola Harman, MD;  Location: [facility] OR;  Service: Vascular;  Laterality: Left;    Social History:  Social History   Substance and Sexual Activity  Alcohol Use No     Social History   Substance and Sexual Activity  Drug Use Never    Social History   Socioeconomic History   Marital status: Single    Spouse name: Not on file   Number of children: Not on file   Years of education: Not on file   Highest education level: Not on file  Occupational History   Not on file  Tobacco Use   Smoking status: Every Day    Current packs/day: 0.50    Types: Cigarettes   Smokeless tobacco: Never  Vaping Use   Vaping status: Never Used  Substance and Sexual Activity   Alcohol use: No   Drug use: Never   Sexual activity: Not Currently  Other Topics Concern   Not on file  Social History Narrative   Not on file   Social Drivers of Health   Financial Resource Strain: Not on file  Food Insecurity: No Food Insecurity (04/27/2023)   Hunger Vital Sign    Worried About Running Out  of Food in the Last Year: Never true    Ran Out of Food in the Last Year: Never true  Transportation Needs: No Transportation Needs (04/27/2023)   PRAPARE - Administrator, Civil Service (Medical): No    Lack of Transportation (Non-Medical): No  Physical Activity: Not on file  Stress: Not on file  Social Connections: Not on file                            Sleep: Fair  Appetite:  Fair  Current Medications: Current Facility-Administered Medications  Medication Dose Route Frequency Provider Last Rate Last Admin   acetaminophen (TYLENOL) tablet 650 mg  650 mg Oral Q6H PRN Motley-Mangrum, [name], PMHNP       alum & mag hydroxide-simeth (MAALOX/MYLANTA) 200-200-20 MG/5ML suspension 30 mL  30 mL Oral Q4H PRN Motley-Mangrum, [name], PMHNP       amantadine (SYMMETREL) capsule 100 mg  100 mg Oral BID Lewanda Rife, MD   100 mg at 05/04/23 1122   amLODipine (NORVASC) tablet 5 mg  5 mg Oral Daily Lewanda Rife, MD   5 mg at 05/04/23 1119   aspirin EC tablet 81 mg  81 mg Oral Daily Lewanda Rife, MD   81 mg at 05/04/23 1119   benztropine (COGENTIN) tablet 1 mg  1 mg Oral BID Motley-Mangrum, [name], PMHNP   1 mg at 05/04/23 1030   haloperidol (HALDOL) tablet 5 mg  5 mg Oral TID PRN Motley-Mangrum, [name], PMHNP   5 mg at 05/02/23 7829   And   diphenhydrAMINE (BENADRYL) capsule 50 mg  50 mg Oral TID PRN Motley-Mangrum, [name], PMHNP   50 mg at 05/02/23 5621   haloperidol lactate (HALDOL) injection 10 mg  10 mg Intramuscular TID PRN Motley-Mangrum, [name], PMHNP       And   diphenhydrAMINE (BENADRYL) injection 50 mg  50 mg Intramuscular TID PRN Motley-Mangrum, [name], PMHNP       And   LORazepam (ATIVAN) injection 2 mg  2 mg Intramuscular TID PRN Motley-Mangrum, [name], PMHNP       haloperidol lactate (HALDOL) injection 5 mg  5 mg Intramuscular TID PRN Motley-Mangrum, [name], PMHNP       And   diphenhydrAMINE (BENADRYL) injection 50 mg  50 mg Intramuscular TID PRN Motley-Mangrum, [name], PMHNP       And   LORazepam (ATIVAN) injection 2 mg  2 mg Intramuscular TID PRN Motley-Mangrum, [name], PMHNP       divalproex (DEPAKOTE ER) 24 hr tablet 500 mg  500 mg Oral BID Motley-Mangrum, [name], PMHNP   500 mg at 05/04/23 1030   gabapentin (NEURONTIN) capsule 400 mg  400 mg Oral BID Motley-Mangrum, [name],  PMHNP   400 mg at 05/04/23 1030   haloperidol (HALDOL) tablet 5 mg  5 mg Oral Daily Lewanda Rife, MD   5 mg at 05/04/23 1030   hydrOXYzine (ATARAX) tablet 25 mg  25 mg Oral TID PRN Motley-Mangrum, [name], PMHNP   25 mg at 05/03/23 2126   LORazepam (ATIVAN) tablet 1 mg  1 mg Oral Q8H PRN Motley-Mangrum, [name], PMHNP   1 mg at 05/02/23 3086   magnesium hydroxide (MILK OF MAGNESIA) suspension 30 mL  30 mL Oral Daily PRN Motley-Mangrum, [name], PMHNP       OLANZapine (ZYPREXA) tablet 10 mg  10 mg Oral QHS Elane Fritz  Edward, DO   10 mg at 05/03/23 2127   traZODone (DESYREL) tablet 100 mg  100 mg Oral QHS PRN Motley-Mangrum, [name], PMHNP   100 mg at 05/03/23 2126   valbenazine ([name]) capsule 40 mg  40 mg Oral Daily [name], DO   40 mg at 05/04/23 1030    Lab Results: No results found for this or any previous visit (from the past 48 hours).  Blood Alcohol level:  Lab Results  Component Value Date   ETH <10 04/25/2023   ETH <10 12/31/2022    Metabolic Disorder Labs: Lab Results  Component Value Date   HGBA1C 5.5 12/31/2022   MPG 111.15 12/31/2022   MPG 134.11 12/29/2021   No results found for: "PROLACTIN" Lab Results  Component Value Date   CHOL 133 12/31/2022   TRIG 39 12/31/2022   HDL 54 12/31/2022   CHOLHDL 2.5 12/31/2022   VLDL 8 12/31/2022   LDLCALC 71 12/31/2022   LDLCALC 69 12/01/2022     Musculoskeletal: Strength & Muscle Tone: within normal limits Gait & Station: normal Patient leans: N/A  Psychiatric Specialty Exam:  Presentation  General Appearance:  Appropriate for Environment  Eye Contact: Good  Speech: Spontaneous  Speech Volume: Normal  Handedness: Right   Mood and Affect  Mood: " Anxious"  Affect: Restricted and avoidant   Thought Process  Thought Processes: Goal-directed  Descriptions of Associations: Intact  Orientation:Full (Time, Place and Person)  Thought Content: Delusional and  paranoia  History of Schizophrenia/Schizoaffective disorder:Yes  Duration of Psychotic Symptoms:Greater than six months  Hallucinations: Denies, is observed responding to internal stimuli and talking to self ideas of Reference:None  Suicidal Thoughts: Denies  Homicidal Thoughts: Denies  Sensorium  Memory: Fair  Judgment: Limited  Insight: Limited   Executive Functions  Concentration: Fair  Attention Span: Fair   Language: Fair   Psychomotor Activity  Psychomotor Activity: Variable, at times increased  Assets  Assets: Manufacturing systems engineer; Desire for Improvement   Sleep  Sleep: Fair  Physical Exam: Physical Exam Constitutional:      Appearance: Normal appearance.  HENT:     Head: Normocephalic and atraumatic.     Nose: No congestion.  Eyes:     Pupils: Pupils are equal, round, and reactive to light.  Cardiovascular:     Rate and Rhythm: Regular rhythm.  Pulmonary:     Effort: Pulmonary effort is normal.  Skin:    General: Skin is warm.  Neurological:     General: No focal deficit present.     Mental Status: He is alert and oriented to person, place, and time.    Review of Systems  Constitutional:  Negative for chills and fever.  HENT:  Negative for hearing loss and tinnitus.   Eyes:  Negative for blurred vision.  Respiratory:  Negative for cough and shortness of breath.   Cardiovascular:  Negative for chest pain and palpitations.  Gastrointestinal:  Negative for nausea and vomiting.   Blood pressure (!) 166/129, pulse 78, temperature 98.2 F (36.8 C), resp. rate 15, height 5\' 11"  (1.803 m), weight 71.2 kg, SpO2 99%. Body mass index is 21.9 kg/m.   Treatment Plan Summary: Daily contact with patient to assess and evaluate symptoms and progress in treatment and Medication management  Patient is on 2 antipsychotics Decrease the dose of Haldol from 5 mg p.o. twice daily to 5 mg daily, 05/02/23  Discontinue Haldol 5 mg p.o. daily starting  05/05/2023 Increase the dose of olanzapine to  15 mg at bedtime, 05/04/2023 Will continue on [name] 40 mg p.o. daily Will restart Norvasc 5 mg by mouth daily Patient had received clonidine 0.1 mg today due to increase systolic and diastolic pressure  Lewanda Rife, MD

## 2023-05-04 NOTE — Plan of Care (Signed)
D: Pt alert and oriented. Pt denies experiencing any anxiety/depression at this time. Pt denies experiencing any pain at this time. Pt denies experiencing any SI/HI, or AVH at this time however, can be observed responding to internal stimuli.   A: Scheduled medications administered to pt, per MD orders. Support and encouragement provided. Frequent verbal contact made. Routine safety checks conducted q15 minutes.   R: No adverse drug reactions noted. Pt verbally contracts for safety at this time. Pt compliant with medications and treatment plan. Pt interacts minimally with others on the unit. Pt remains safe at this time. Plan of care ongoing.  Problem: Education: Goal: Mental status will improve Outcome: Not Progressing   Problem: Coping: Goal: Ability to demonstrate self-control will improve Outcome: Not Progressing

## 2023-05-04 NOTE — Group Note (Signed)
Date:  05/03/2023 Time:  9:00 PM  Group Topic/Focus:  Conflict Resolution:   The focus of this group is to discuss the conflict resolution process and how it may be used upon discharge.    Participation Level:  Active  Participation Quality:  Appropriate  Affect:  Appropriate  Cognitive:  Appropriate  Insight: Appropriate  Engagement in Group:  Engaged  Modes of Intervention:  Education  Additional Comments:    Garry Heater 05/03/2023, 9:00 PM

## 2023-05-05 DIAGNOSIS — F200 Paranoid schizophrenia: Secondary | ICD-10-CM | POA: Diagnosis not present

## 2023-05-05 NOTE — Plan of Care (Signed)
D: Pt alert and oriented. Pt denies experiencing any anxiety/depression at this time however, exhibits anxious behaviors. Pt also voices that he believes someone is licking his food and that he saw a man with a knife in the hallway. Pt provide reassurance that no one here would lick his food and that weapons are not allowed on this unit. Pt denies experiencing any pain at this time. Pt denies experiencing any SI/HI, or AVH at this time however, can be observed responding to internal stimuli.   A: Scheduled medications administered to pt, per MD orders. Support and encouragement provided. Frequent verbal contact made. Routine safety checks conducted q15 minutes.   R: No adverse drug reactions noted. Pt verbally contracts for safety at this time. Pt compliant with medications and treatment plan. Pt interacts minimally with others on the unit mostly self isolating to his room with the exception of meals. Pt remains safe at this time. Plan of care ongoing.  Problem: Education: Goal: Emotional status will improve Outcome: Not Progressing Goal: Mental status will improve Outcome: Not Progressing

## 2023-05-05 NOTE — Group Note (Signed)
Recreation Therapy Group Note   Group Topic:General Recreation  Group Date: 05/05/2023 Start Time: 1100 End Time: 1150 Facilitators: Rosina Lowenstein, LRT, CTRS Location:  Dayroom  Group Description: General Recreation. Patients were given the opportunity to play cards, journal, or sing karaoke during group. Pt identified and conversated about things they enjoy doing in their free time and how they can continue to do that outside of the hospital.  Goal Area(s) Addressed: Patient will practice making a positive decision. Patient will have the opportunity to try a new leisure activity. Patient will communicate with peers and LRT.   Affect/Mood: N/A   Participation Level: Did not attend    Clinical Observations/Individualized Feedback: Patient did not attend group.   Plan: Continue to engage patient in RT group sessions 2-3x/week.   Rosina Lowenstein, LRT, CTRS 05/05/2023 1:32 PM

## 2023-05-05 NOTE — Progress Notes (Signed)
[facility] MD Progress Note  05/05/2023 11:52 AM Mark [name]  MRN:  [id number]  Mark [name] is a 62 year old African-American male who is involuntarily admitted to psychiatry for aggressive behavior towards his parents. He lives with mom and dad in [place]. He has a long history of schizophrenia.   Subjective: Case discussed in multidisciplinary meeting, chart reviewed, patient seen today.  Staff reported that patient was given as needed meds Ativan last night for anxiety. Patient reports" fine" mood today.  He reports he slept good last night.  Patient continues to deny auditory or visual hallucination, but has been observed responding to internal stimuli.  Patient has been avoiding groups, he was once again encouraged to attend groups. Patient was provided with support and reassurance.  Today patient denies suicidal thoughts.   Patient denies homicidal ideation.   Principal Problem: Paranoid schizophrenia (HCC) Diagnosis: Principal Problem:   Paranoid schizophrenia (HCC)   Past Psychiatric History: He has a long history of schizophrenia.  Past admission in February 2023, patient was discharged on Depakote 500 mg twice daily, Cogentin 1 mg twice daily, Haldol 5 mg twice daily and Neurontin 400 mg 3 times daily also clozapine was discontinued during that hospitalization,Past Psychotropic medications included clozapine, Depakote, Haldol, Cogentin, Inderal, trazodone and Vistaril. Prior    Past Medical History:  Past Medical History:  Diagnosis Date   COPD (chronic obstructive pulmonary disease) (HCC)    Hypertension    Schizophrenia (HCC)     Past Surgical History:  Procedure Laterality Date   LOWER EXTREMITY ANGIOGRAPHY N/A 09/18/2017   Procedure: LOWER EXTREMITY ANGIOGRAPHY- Recheck lysis;  Surgeon: Maeola Harman, MD;  Location: [facility] INVASIVE CV LAB;  Service: Cardiovascular;  Laterality: N/A;   PERIPHERAL VASCULAR INTERVENTION Left 09/18/2017   Procedure: PERIPHERAL VASCULAR  INTERVENTION;  Surgeon: Maeola Harman, MD;  Location: [facility] INVASIVE CV LAB;  Service: Cardiovascular;  Laterality: Left;   THROMBECTOMY FEMORAL ARTERY Left 09/17/2017   Procedure: POSSIBLE THROMBECTOMY;  Surgeon: Maeola Harman, MD;  Location: [facility] OR;  Service: Vascular;  Laterality: Left;   VENOGRAM Left 09/17/2017   Procedure: ULTRASOUND POPLITEAL ACCESS; CENTRAL VENOGRAM, IVVS, LYSIS CATHETER PLACEMENT;  Surgeon: Maeola Harman, MD;  Location: [facility] OR;  Service: Vascular;  Laterality: Left;    Social History:  Social History   Substance and Sexual Activity  Alcohol Use No     Social History   Substance and Sexual Activity  Drug Use Never    Social History   Socioeconomic History   Marital status: Single    Spouse name: Not on file   Number of children: Not on file   Years of education: Not on file   Highest education level: Not on file  Occupational History   Not on file  Tobacco Use   Smoking status: Every Day    Current packs/day: 0.50    Types: Cigarettes   Smokeless tobacco: Never  Vaping Use   Vaping status: Never Used  Substance and Sexual Activity   Alcohol use: No   Drug use: Never   Sexual activity: Not Currently  Other Topics Concern   Not on file  Social History Narrative   Not on file   Social Drivers of Health   Financial Resource Strain: Not on file  Food Insecurity: No Food Insecurity (04/27/2023)   Hunger Vital Sign    Worried About Running Out of Food in the Last Year: Never true    Ran Out of Food in the Last Year: Never true  Transportation Needs: No Transportation Needs (04/27/2023)   PRAPARE - Administrator, Civil Service (Medical): No    Lack of Transportation (Non-Medical): No  Physical Activity: Not on file  Stress: Not on file  Social Connections: Not on file                           Sleep: Fair  Appetite:  Fair  Current Medications: Current Facility-Administered  Medications  Medication Dose Route Frequency Provider Last Rate Last Admin   acetaminophen (TYLENOL) tablet 650 mg  650 mg Oral Q6H PRN Motley-Mangrum, [name], PMHNP       alum & mag hydroxide-simeth (MAALOX/MYLANTA) 200-200-20 MG/5ML suspension 30 mL  30 mL Oral Q4H PRN Motley-Mangrum, [name], PMHNP       amantadine (SYMMETREL) capsule 100 mg  100 mg Oral BID Lewanda Rife, MD   100 mg at 05/05/23 0924   amLODipine (NORVASC) tablet 5 mg  5 mg Oral Daily Lewanda Rife, MD   5 mg at 05/05/23 1610   aspirin EC tablet 81 mg  81 mg Oral Daily Lewanda Rife, MD   81 mg at 05/05/23 9604   haloperidol (HALDOL) tablet 5 mg  5 mg Oral TID PRN Motley-Mangrum, [name], PMHNP   5 mg at 05/02/23 5409   And   diphenhydrAMINE (BENADRYL) capsule 50 mg  50 mg Oral TID PRN Motley-Mangrum, [name], PMHNP   50 mg at 05/02/23 8119   haloperidol lactate (HALDOL) injection 10 mg  10 mg Intramuscular TID PRN Motley-Mangrum, [name], PMHNP       And   diphenhydrAMINE (BENADRYL) injection 50 mg  50 mg Intramuscular TID PRN Motley-Mangrum, [name], PMHNP       And   LORazepam (ATIVAN) injection 2 mg  2 mg Intramuscular TID PRN Motley-Mangrum, [name], PMHNP       haloperidol lactate (HALDOL) injection 5 mg  5 mg Intramuscular TID PRN Motley-Mangrum, [name], PMHNP       And   diphenhydrAMINE (BENADRYL) injection 50 mg  50 mg Intramuscular TID PRN Motley-Mangrum, [name], PMHNP       And   LORazepam (ATIVAN) injection 2 mg  2 mg Intramuscular TID PRN Motley-Mangrum, [name], PMHNP       divalproex (DEPAKOTE ER) 24 hr tablet 500 mg  500 mg Oral BID Motley-Mangrum, [name], PMHNP   500 mg at 05/05/23 0924   gabapentin (NEURONTIN) capsule 400 mg  400 mg Oral BID Motley-Mangrum, [name], PMHNP   400 mg at 05/05/23 0924   hydrOXYzine (ATARAX) tablet 25 mg  25 mg Oral TID PRN Motley-Mangrum, [name], PMHNP   25 mg at 05/04/23 2131   LORazepam (ATIVAN) tablet 1 mg  1 mg Oral Q8H PRN Motley-Mangrum,  [name], PMHNP   1 mg at 05/04/23 2131   magnesium hydroxide (MILK OF MAGNESIA) suspension 30 mL  30 mL Oral Daily PRN Motley-Mangrum, [name], PMHNP       OLANZapine (ZYPREXA) tablet 15 mg  15 mg Oral QHS Lewanda Rife, MD   15 mg at 05/04/23 2132   traZODone (DESYREL) tablet 100 mg  100 mg Oral QHS PRN Motley-Mangrum, [name], PMHNP   100 mg at 05/04/23 2131   valbenazine ([name]) capsule 40 mg  40 mg Oral Daily [name], DO   40 mg at 05/05/23 1478    Lab Results: No results found for this or any  previous visit (from the past 48 hours).  Blood Alcohol level:  Lab Results  Component Value Date   ETH <10 04/25/2023   ETH <10 12/31/2022    Metabolic Disorder Labs: Lab Results  Component Value Date   HGBA1C 5.5 12/31/2022   MPG 111.15 12/31/2022   MPG 134.11 12/29/2021   No results found for: "PROLACTIN" Lab Results  Component Value Date   CHOL 133 12/31/2022   TRIG 39 12/31/2022   HDL 54 12/31/2022   CHOLHDL 2.5 12/31/2022   VLDL 8 12/31/2022   LDLCALC 71 12/31/2022   LDLCALC 69 12/01/2022     Musculoskeletal: Strength & Muscle Tone: within normal limits Gait & Station: normal Patient leans: N/A  Psychiatric Specialty Exam:  Presentation  General Appearance:  Appropriate for Environment  Eye Contact: Good  Speech: Spontaneous  Speech Volume: Normal  Handedness: Right   Mood and Affect  Mood: " Fine."  Affect: Restricted.   Thought Process  Thought Processes: Goal-directed  Descriptions of Associations: Intact  Orientation:Full (Time, Place and Person)  Thought Content: Delusional and paranoia  History of Schizophrenia/Schizoaffective disorder:Yes  Duration of Psychotic Symptoms:Greater than six months  Hallucinations: Denies, is observed responding to internal stimuli and talking to self ideas of Reference:None  Suicidal Thoughts: Denies  Homicidal Thoughts: Denies  Sensorium   Memory: Fair  Judgment: Limited  Insight: Advertising copywriter  Concentration: Fair  Attention Span: Fair   Language: Fair   Psychomotor Activity  Psychomotor Activity: Normal  Assets  Assets: Communication Skills; Desire for Improvement   Sleep  Sleep: Fair  Physical Exam: Physical Exam Constitutional:      Appearance: Normal appearance.  HENT:     Head: Normocephalic and atraumatic.     Nose: No congestion.  Eyes:     Pupils: Pupils are equal, round, and reactive to light.  Cardiovascular:     Rate and Rhythm: Regular rhythm.  Pulmonary:     Effort: Pulmonary effort is normal.  Skin:    General: Skin is warm.  Neurological:     General: No focal deficit present.     Mental Status: He is alert and oriented to person, place, and time.    Review of Systems  Constitutional:  Negative for chills and fever.  HENT:  Negative for hearing loss and tinnitus.   Eyes:  Negative for blurred vision.  Respiratory:  Negative for cough and shortness of breath.   Cardiovascular:  Negative for chest pain and palpitations.  Gastrointestinal:  Negative for nausea and vomiting.   Blood pressure (!) 148/121, pulse 77, temperature 98.2 F (36.8 C), resp. rate 18, height 5\' 11"  (1.803 m), weight 71.2 kg, SpO2 100%. Body mass index is 21.9 kg/m.   Treatment Plan Summary: Daily contact with patient to assess and evaluate symptoms and progress in treatment and Medication management  Patient is on 2 antipsychotics Decreased the dose of Haldol from 5 mg p.o. twice daily to 5 mg daily, 05/02/23  Discontinued Haldol 5 mg p.o. daily starting 05/05/2023 Increased the dose of olanzapine to 15 mg at bedtime, 05/04/2023 Will continue on [name] 40 mg p.o. daily Continue Norvasc 5 mg by mouth daily   Lewanda Rife, MD
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
## 2023-05-05 NOTE — Group Note (Signed)
Date:  05/05/2023 Time:  12:32 PM  Group Topic/Focus:  Goals Group:   The focus of this group is to help patients establish daily goals to achieve during treatment and discuss how the patient can incorporate goal setting into their daily lives to aide in recovery. Healthy Communication:   The focus of this group is to discuss communication, barriers to communication, as well as healthy ways to communicate with others.    Participation Level:  Did Not Attend   Mark [name] 05/05/2023, 12:32 PM

## 2023-05-05 NOTE — Group Note (Signed)
Recreation Therapy Group Note   Group Topic:Emotion Expression  Group Date: 05/05/2023 Start Time: 1500 End Time: 1600 Facilitators: Rosina Lowenstein, LRT, CTRS Location:  Dayroom  Group Description: Positivity Collage. LRT and patients discussed the importance of having a positive mindset and being happy. Patients received magazines, safety scissors, a glue stick and a piece of paper. Pts were encouraged to find images or words in the magazines that showed "happiness" or positivity to them. Pt shared their collage with the group once they were finished. LRT and pts discussed how it can be difficult to always have a positive mindset, especially when they have mental health challenges.   Goal Area(s) Addressed:  Pt will identify things associate with positivity. Pt will reduce negative thinking. Pt will identify a new coping skill of thinking positive thoughts.    Affect/Mood: N/A   Participation Level: Did not attend    Clinical Observations/Individualized Feedback: Daejuan did not attend group.   Plan: Continue to engage patient in RT group sessions 2-3x/week.   Rosina Lowenstein, LRT, CTRS 05/05/2023 4:59 PM

## 2023-05-05 NOTE — Plan of Care (Signed)
Patient denies SI,HI and avh. Patient is minimally confused. Is having some delusions. Is seen talking to himself. Prn atrax and ativan given for anxiety.  Problem: Coping: Goal: Ability to verbalize frustrations and anger appropriately will improve Outcome: Progressing Goal: Ability to demonstrate self-control will improve Outcome: Progressing   Problem: Education: Goal: Knowledge of [facility] Education information/materials will improve Outcome: Progressing Goal: Emotional status will improve Outcome: Progressing

## 2023-05-06 DIAGNOSIS — F200 Paranoid schizophrenia: Secondary | ICD-10-CM | POA: Diagnosis not present

## 2023-05-06 NOTE — Group Note (Signed)
Date:  05/06/2023 Time:  1:21 AM  Group Topic/Focus:  Wrap-Up Group:   The focus of this group is to help patients review their daily goal of treatment and discuss progress on daily workbooks.    Participation Level:  Did Not Attend  Participation Quality:      Affect:      Cognitive:      Insight: None  Engagement in Group:  None  Modes of Intervention:      Additional Comments:    Maeola Harman 05/06/2023, 1:21 AM

## 2023-05-06 NOTE — Plan of Care (Signed)
D: Pt alert and oriented. Pt denies experiencing anxiety/depression at this time however, exhibits anxiety behaviors. Pt reports/denies experiencing any pain at this time. Pt denies/reports experiencing any SI/HI, or AVH at this time however, at times can be observed responding to stimuli.   A: Scheduled medications administered to pt, per MD orders. Support and encouragement provided. Frequent verbal contact made. Routine safety checks conducted q15 minutes.   R: No adverse drug reactions noted. Pt verbally contracts for safety at this time. Pt compliant with medications and treatment plan. Pt interacts minimally with others on the unit only coming out for meals and self isolates to room. Pt remains safe at this time. Plan of care ongoing.  During dinner another pt experiencing missing items on their tray from the cafeteria voiced frustrations which trigger agitation in this pt. This Clinical research associate along with the other nurse where resolving the issue with the other pt when this pt stated to this writer "why don't you just get out of her fucking face". This Clinical research associate explained the this pt that this Clinical research associate was helping the other pt resolve and issue and that the pt had no issues or grievances with this Clinical research associate. This pt then stated "no she don't like you and why don't you get out of my fucking face too". The other pt then stated "no I like this nurse, she is my nurse and I'm having no problems with her". This pt then cussed a few more times before finishing eating and going to his room.    Problem: Education: Goal: Emotional status will improve Outcome: Not Progressing Goal: Mental status will improve Outcome: Not Progressing

## 2023-05-06 NOTE — Progress Notes (Signed)
Patient observed sitting in the day room, isolative and guarded at shift change. Patient denies SI/HI/pain and anxiety. Patient med compliant, received PRN Trazodone 50 mg PO for insomnia. Patient ate a snack and returned to bed, no acute distress noted, routine obs to continue to monitor pt. Safety.

## 2023-05-06 NOTE — Progress Notes (Signed)
   05/06/23 0000  Psych Admission Type (Psych Patients Only)  Admission Status Involuntary  Psychosocial Assessment  Patient Complaints Anxiety;Isolation  Eye Contact Fair  Facial Expression Anxious  Affect Anxious;Preoccupied  Speech Tangential  Interaction Guarded  Motor Activity Tremors  Appearance/Hygiene Disheveled  Behavior Characteristics Cooperative  Mood Preoccupied;Anxious  Aggressive Behavior  Effect No apparent injury  Thought Process  Coherency Circumstantial;Tangential  Content Preoccupation  Delusions None reported or observed  Perception WDL  Hallucination None reported or observed (Pt. denies)  Judgment Limited  Confusion Mild  Danger to Self  Current suicidal ideation? Denies  Danger to Others  Danger to Others None reported or observed

## 2023-05-06 NOTE — Group Note (Signed)
LCSW Group Therapy Note  Group Date: 05/06/2023 Start Time: 1300 End Time: 1340   Type of Therapy and Topic:  Group Therapy - Healthy vs Unhealthy Coping Skills  Participation Level: Patient Participation was Minimal as he left after 15 min.  Description of Group The focus of this group was to determine what unhealthy coping techniques typically are used by group members and what healthy coping techniques would be helpful in coping with various problems. Patients were guided in becoming aware of the differences between healthy and unhealthy coping techniques. Patients were asked to identify 2-3 healthy coping skills they would like to learn to use more effectively.  Therapeutic Goals Patients learned that coping is what human beings do all day long to deal with various situations in their lives Patients defined and discussed healthy vs unhealthy coping techniques Patients identified their preferred coping techniques and identified whether these were healthy or unhealthy Patients determined 2-3 healthy coping skills they would like to become more familiar with and use more often. Patients provided support and ideas to each other   Summary of Patient Progress:  During group, Mark [name] expressed using distraction like shooting hoops as a coping mechanism. Patient proved open to input from peers and feedback from CSW. Patient demonstrated some insight into the subject matter while he was present and was respectful of peers.   Therapeutic Modalities Cognitive Behavioral Therapy Motivational Interviewing  Azucena Kuba, LCSWA 05/06/2023  2:03 PM

## 2023-05-06 NOTE — Plan of Care (Signed)
  Problem: Education: Goal: Knowledge of [facility] Education information/materials will improve Outcome: Progressing Goal: Emotional status will improve Outcome: Progressing Goal: Mental status will improve Outcome: Progressing Goal: Verbalization of understanding the information provided will improve Outcome: Progressing   Problem: Education: Goal: Emotional status will improve Outcome: Progressing Goal: Mental status will improve Outcome: Progressing   Problem: Coping: Goal: Ability to verbalize frustrations and anger appropriately will improve Outcome: Progressing Goal: Ability to demonstrate self-control will improve Outcome: Progressing   Problem: Health Behavior/Discharge Planning: Goal: Identification of resources available to assist in meeting health care needs will improve Outcome: Progressing Goal: Compliance with treatment plan for underlying cause of condition will improve Outcome: Progressing   Problem: Physical Regulation: Goal: Ability to maintain clinical measurements within normal limits will improve Outcome: Progressing   Problem: Safety: Goal: Periods of time without injury will increase Outcome: Progressing

## 2023-05-06 NOTE — Group Note (Signed)
Date:  05/06/2023 Time:  4:24 PM  Group Topic/Focus:  Healthy Communication:   The focus of this group is to discuss communication, barriers to communication, as well as healthy ways to communicate with others. Self Care:   The focus of this group is to help patients understand the importance of self-care in order to improve or restore emotional, physical, spiritual, interpersonal, and financial health.    Participation Level:  Did Not Attend  Participation Quality:  Appropriate  Affect:  Appropriate  Cognitive:  Appropriate  Insight: Appropriate  Engagement in Group:  Engaged  Modes of Intervention:  Activity  Additional Comments:    Letricia Krinsky 05/06/2023, 4:24 PM

## 2023-05-06 NOTE — Group Note (Signed)
Date:  05/06/2023 Time:  8:44 PM  Group Topic/Focus:  Personal Choices and Values:   The focus of this group is to help patients assess and explore the importance of values in their lives, how their values affect their decisions, how they express their values and what opposes their expression.    Participation Level:  Active  Participation Quality:  Appropriate  Affect:  Appropriate  Cognitive:  Appropriate  Insight: Good  Engagement in Group:  Engaged  Modes of Intervention:  Discussion  Additional Comments:    Burt Ek 05/06/2023, 8:44 PM
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
## 2023-05-06 NOTE — Progress Notes (Signed)
   05/06/23 0618  15 Minute Checks  Location Bedroom  Visual Appearance Calm  Behavior Sleeping  Sleep (Behavioral Health Patients Only)  Calculate sleep? (Click Yes once per 24 hr at 0600 safety check) Yes  Documented sleep last 24 hours 9

## 2023-05-07 DIAGNOSIS — F200 Paranoid schizophrenia: Secondary | ICD-10-CM | POA: Diagnosis not present
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
## 2023-05-07 NOTE — Progress Notes (Signed)
 Patient upset after not receiving his coffee with breakfast.  Anxious, irritable affect.  No PRNs required. Denies SI/HI/ AVH.  Denies pain.    Compliant with scheduled medications.  15 min checks in place for safety.  Minimal time in milieu this morning.  Minimal interaction with peers when present in dayroom.

## 2023-05-07 NOTE — Plan of Care (Signed)
°  Problem: Coping: Goal: Ability to verbalize frustrations and anger appropriately will improve Outcome: Progressing   Problem: Coping: Goal: Ability to demonstrate self-control will improve Outcome: Not Progressing

## 2023-05-07 NOTE — Progress Notes (Signed)
°   05/07/23 2127  Psych Admission Type (Psych Patients Only)  Admission Status Involuntary  Psychosocial Assessment  Patient Complaints Worrying (trying to call family today but not getting any answer on the phone)  Eye Contact Fair  Facial Expression Anxious  Affect Anxious  Speech Logical/coherent  Interaction Guarded  Motor Activity Tremors;Slow  Appearance/Hygiene Unremarkable;In scrubs  Behavior Characteristics Cooperative;Anxious  Mood Anxious  Thought Process  Coherency WDL  Content Preoccupation  Delusions None reported or observed  Perception WDL  Hallucination None reported or observed  Judgment Limited  Confusion WDL  Danger to Self  Current suicidal ideation? Denies  Danger to Others  Danger to Others None reported or observed   Progress note   D: Pt seen in dayroom and at nurse's station. Pt denies SI, HI, AVH. Pt rates pain  0/10. Pt states he was a bit worried today because he tried to reach his family by phone with no results. "It's just my dad and my little brother and me." Pt does have moments of laughing to himself. Pt cooperative. Seen in dayroom for group. No other concerns noted at this time.  A: Pt provided support and encouragement. Pt given scheduled medication as prescribed. PRNs as appropriate. Q15 min checks for safety.   R: Pt safe on the unit. Will continue to monitor.

## 2023-05-07 NOTE — Plan of Care (Signed)
  Problem: Education: Goal: Knowledge of [facility] Education information/materials will improve Outcome: Progressing Goal: Emotional status will improve Outcome: Progressing Goal: Mental status will improve Outcome: Progressing Goal: Verbalization of understanding the information provided will improve Outcome: Progressing   Problem: Education: Goal: Emotional status will improve Outcome: Progressing Goal: Mental status will improve Outcome: Progressing   Problem: Coping: Goal: Ability to verbalize frustrations and anger appropriately will improve Outcome: Progressing Goal: Ability to demonstrate self-control will improve Outcome: Progressing   Problem: Health Behavior/Discharge Planning: Goal: Identification of resources available to assist in meeting health care needs will improve Outcome: Progressing Goal: Compliance with treatment plan for underlying cause of condition will improve Outcome: Progressing   Problem: Physical Regulation: Goal: Ability to maintain clinical measurements within normal limits will improve Outcome: Progressing   Problem: Safety: Goal: Periods of time without injury will increase Outcome: Progressing

## 2023-05-07 NOTE — Group Note (Signed)
Date:  05/07/2023 Time:  9:03 PM  Group Topic/Focus:  Wrap-Up Group:   The focus of this group is to help patients review their daily goal of treatment and discuss progress on daily workbooks.    Participation Level:  Did Not Attend  Participation Quality:      Affect:      Cognitive:      Insight: None  Engagement in Group:  None  Modes of Intervention:      Additional Comments:    Maeola Harman 05/07/2023, 9:03 PM

## 2023-05-08 DIAGNOSIS — F200 Paranoid schizophrenia: Secondary | ICD-10-CM | POA: Diagnosis not present

## 2023-05-08 NOTE — Group Note (Signed)
Date:  05/08/2023 Time:  11:08 AM  Group Topic/Focus:  Movie Group The purpose of this group is to allow patients to have leisure time watching movies while having snacks     Participation Level:  Active  Participation Quality:  Supportive  Affect:  Appropriate  Cognitive:  Disorganized and Confused  Insight: Lacking  Engagement in Group:  Limited  Modes of Intervention:  Activity  Additional Comments:    Marta Antu 05/08/2023, 11:08 AM

## 2023-05-08 NOTE — Plan of Care (Signed)
  Problem: Education: Goal: Emotional status will improve Outcome: Progressing Goal: Mental status will improve Outcome: Not Progressing Goal: Verbalization of understanding the information provided will improve Outcome: Progressing   Problem: Coping: Goal: Ability to verbalize frustrations and anger appropriately will improve Outcome: Not Progressing Goal: Ability to demonstrate self-control will improve Outcome: Progressing   Problem: Safety: Goal: Periods of time without injury will increase Outcome: Progressing

## 2023-05-08 NOTE — Group Note (Signed)
Date:  05/08/2023 Time:  11:49 PM  Group Topic/Focus:  Wrap-Up Group:   The focus of this group is to help patients review their daily goal of treatment and discuss progress on daily workbooks.    Participation Level:  Minimal  Participation Quality:  Redirectable  Affect:  Appropriate  Cognitive:  Oriented  Insight: Improving  Engagement in Group:  Improving  Modes of Intervention:  Discussion  Additional Comments:    Maeola Harman 05/08/2023, 11:49 PM

## 2023-05-08 NOTE — Progress Notes (Deleted)
Patient appear agitated and disrupting dayroom, hallway, nurse' station. Patient upset about past family issues stating, "My mama left me and abandoned me, etc". Multiple redirecting to room to deescalate patient but agitation escalated. Security called but support. Medicated for agitation as prescribed. Will continue to monitor.

## 2023-05-08 NOTE — Group Note (Signed)
Recreation Therapy Group Note   Group Topic:Health and Wellness  Group Date: 05/08/2023 Start Time: 1400 End Time: 1500 Facilitators: Rosina Lowenstein, LRT, CTRS Location: Courtyard  Group Description: Outdoor Recreation. Patients had the option to play corn hole, ring toss, bowling or listening to music while outside in the courtyard getting fresh air and sunlight. LRT and patients discussed things that they enjoy doing in their free time outside of the hospital. LRT encouraged patients to drink water after being active and getting their heart rate up.   Goal Area(s) Addressed: Patient will identify leisure interests.  Patient will practice healthy decision making. Patient will engage in recreation activity.   Affect/Mood: Full range   Participation Level: Active   Participation Quality: Independent   Behavior: Cooperative   Speech/Thought Process: Preoccupied   Insight: Fair   Judgement: Fair    Modes of Intervention: Activity, Exploration, Open Conversation, and Rapport Building   Patient Response to Interventions:  Disengaged   Education Outcome:  In group clarification offered    Clinical Observations/Individualized Feedback: Mark [name] was somewhat active in their participation of session activities and group discussion. Pt was present in the courtyard in the beginning. Pt was singing and dancing. Briefly after, pt was sitting alone on the bench and began speaking loudly to the side of him and no one was there. Pt seemed somewhat angry or mad. Pt got up and went inside without talking to anyone.    Plan: Continue to engage patient in RT group sessions 2-3x/week.   Rosina Lowenstein, LRT, CTRS 05/08/2023 4:40 PM

## 2023-05-08 NOTE — Progress Notes (Signed)
Patient yelling, screaming, and cursing at staff and Child psychotherapist. Patient making gestures as if he wants to cut our heads off at the neck. He is agitated and labile. PRN injection given.

## 2023-05-08 NOTE — Progress Notes (Signed)
Patient became agitated this shift, cursing yelling in room and day room. Patient pre-occupied positive for AVH. Agitation protocol given to patient without incident. Patient currently in bed resting at this time.

## 2023-05-08 NOTE — Plan of Care (Signed)
  Problem: Education: Goal: Knowledge of [facility] Education information/materials will improve Outcome: Not Progressing Goal: Emotional status will improve Outcome: Not Progressing Goal: Mental status will improve Outcome: Not Progressing Goal: Verbalization of understanding the information provided will improve Outcome: Not Progressing   Problem: Education: Goal: Emotional status will improve Outcome: Not Progressing Goal: Mental status will improve Outcome: Not Progressing   Problem: Coping: Goal: Ability to verbalize frustrations and anger appropriately will improve Outcome: Not Progressing Goal: Ability to demonstrate self-control will improve Outcome: Not Progressing   Problem: Health Behavior/Discharge Planning: Goal: Identification of resources available to assist in meeting health care needs will improve Outcome: Not Progressing Goal: Compliance with treatment plan for underlying cause of condition will improve Outcome: Not Progressing   Problem: Physical Regulation: Goal: Ability to maintain clinical measurements within normal limits will improve Outcome: Not Progressing   Problem: Safety: Goal: Periods of time without injury will increase Outcome: Not Progressing
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
## 2023-05-08 NOTE — BH IP Treatment Plan (Signed)
Interdisciplinary Treatment and Diagnostic Plan Update  05/08/2023 Time of Session: 9:30 AM  Mark [name] MRN: [id number]  Principal Diagnosis: Paranoid schizophrenia (HCC)  Secondary Diagnoses: Principal Problem:   Paranoid schizophrenia (HCC)   Current Medications:  Current Facility-Administered Medications  Medication Dose Route Frequency Provider Last Rate Last Admin   acetaminophen (TYLENOL) tablet 650 mg  650 mg Oral Q6H PRN Motley-Mangrum, [name], PMHNP       alum & mag hydroxide-simeth (MAALOX/MYLANTA) 200-200-20 MG/5ML suspension 30 mL  30 mL Oral Q4H PRN Motley-Mangrum, [name], PMHNP       amantadine (SYMMETREL) capsule 100 mg  100 mg Oral BID Lewanda Rife, MD   100 mg at 05/08/23 1014   amLODipine (NORVASC) tablet 5 mg  5 mg Oral Daily Lewanda Rife, MD   5 mg at 05/08/23 1009   aspirin EC tablet 81 mg  81 mg Oral Daily Lewanda Rife, MD   81 mg at 05/08/23 1009   haloperidol (HALDOL) tablet 5 mg  5 mg Oral TID PRN Motley-Mangrum, [name], PMHNP   5 mg at 05/02/23 4098   And   diphenhydrAMINE (BENADRYL) capsule 50 mg  50 mg Oral TID PRN Motley-Mangrum, [name], PMHNP   50 mg at 05/02/23 1191   haloperidol lactate (HALDOL) injection 10 mg  10 mg Intramuscular TID PRN Motley-Mangrum, [name], PMHNP       And   diphenhydrAMINE (BENADRYL) injection 50 mg  50 mg Intramuscular TID PRN Motley-Mangrum, [name], PMHNP       And   LORazepam (ATIVAN) injection 2 mg  2 mg Intramuscular TID PRN Motley-Mangrum, [name], PMHNP       haloperidol lactate (HALDOL) injection 5 mg  5 mg Intramuscular TID PRN Motley-Mangrum, [name], PMHNP       And   diphenhydrAMINE (BENADRYL) injection 50 mg  50 mg Intramuscular TID PRN Motley-Mangrum, [name], PMHNP       And   LORazepam (ATIVAN) injection 2 mg  2 mg Intramuscular TID PRN Motley-Mangrum, [name], PMHNP       divalproex (DEPAKOTE ER) 24 hr tablet 500 mg  500 mg Oral BID Motley-Mangrum, [name], PMHNP   500 mg  at 05/08/23 1014   gabapentin (NEURONTIN) capsule 400 mg  400 mg Oral BID Motley-Mangrum, [name], PMHNP   400 mg at 05/08/23 1009   hydrOXYzine (ATARAX) tablet 25 mg  25 mg Oral TID PRN Motley-Mangrum, [name], PMHNP   25 mg at 05/04/23 2131   LORazepam (ATIVAN) tablet 1 mg  1 mg Oral Q8H PRN Motley-Mangrum, [name], PMHNP   1 mg at 05/08/23 1019   magnesium hydroxide (MILK OF MAGNESIA) suspension 30 mL  30 mL Oral Daily PRN Motley-Mangrum, [name], PMHNP       OLANZapine (ZYPREXA) tablet 15 mg  15 mg Oral QHS Lewanda Rife, MD   15 mg at 05/07/23 2115   traZODone (DESYREL) tablet 100 mg  100 mg Oral QHS PRN Motley-Mangrum, [name], PMHNP   100 mg at 05/07/23 2114   valbenazine ([name]) capsule 40 mg  40 mg Oral Daily [name], DO   40 mg at 05/08/23 1009   PTA Medications: Medications Prior to Admission  Medication Sig Dispense Refill Last Dose/Taking   amantadine (SYMMETREL) 100 MG capsule Take 100 mg by mouth 2 (two) times daily.      amLODipine (NORVASC) 5 MG tablet Take 1 tablet (5 mg total) by mouth daily.  aspirin EC 81 MG tablet Take 81 mg by mouth daily. Swallow whole.      benztropine (COGENTIN) 1 MG tablet Take 1 tablet (1 mg total) by mouth 2 (two) times daily. 60 tablet 0    cyclobenzaprine (FLEXERIL) 10 MG tablet Take 10 mg by mouth 2 (two) times daily as needed.      divalproex (DEPAKOTE ER) 500 MG 24 hr tablet Take 1 tablet (500 mg total) by mouth 2 (two) times daily. (Patient not taking: Reported on 04/25/2023) 60 tablet 0    gabapentin (NEURONTIN) 300 MG capsule Take 300 mg by mouth 3 (three) times daily. (Patient not taking: Reported on 04/25/2023)      gabapentin (NEURONTIN) 400 MG capsule Take 1 capsule (400 mg total) by mouth 3 (three) times daily. (Patient not taking: Reported on 04/25/2023) 90 capsule 0    haloperidol (HALDOL) 10 MG tablet Take 1 tablet (10 mg total) by mouth 2 (two) times daily. (Patient not taking: Reported on 04/25/2023)       traZODone (DESYREL) 150 MG tablet Take 1 tablet (150 mg total) by mouth at bedtime. (Patient not taking: Reported on 04/25/2023) 30 tablet 0     Patient Stressors: Marital or family conflict   Medication change or noncompliance    Patient Strengths: Supportive family/friends   Treatment Modalities: Medication Management, Group therapy, Case management,  1 to 1 session with clinician, Psychoeducation, Recreational therapy.   Physician Treatment Plan for Primary Diagnosis: Paranoid schizophrenia (HCC) Long Term Goal(s): Improvement in symptoms so as ready for discharge   Short Term Goals: Ability to identify changes in lifestyle to reduce recurrence of condition will improve Ability to verbalize feelings will improve Ability to disclose and discuss suicidal ideas Ability to demonstrate self-control will improve Ability to identify and develop effective coping behaviors will improve Ability to maintain clinical measurements within normal limits will improve Compliance with prescribed medications will improve Ability to identify triggers associated with substance abuse/mental health issues will improve  Medication Management: Evaluate patient's response, side effects, and tolerance of medication regimen.  Therapeutic Interventions: 1 to 1 sessions, Unit Group sessions and Medication administration.  Evaluation of Outcomes: Progressing  Physician Treatment Plan for Secondary Diagnosis: Principal Problem:   Paranoid schizophrenia (HCC)  Long Term Goal(s): Improvement in symptoms so as ready for discharge   Short Term Goals: Ability to identify changes in lifestyle to reduce recurrence of condition will improve Ability to verbalize feelings will improve Ability to disclose and discuss suicidal ideas Ability to demonstrate self-control will improve Ability to identify and develop effective coping behaviors will improve Ability to maintain clinical measurements within normal limits  will improve Compliance with prescribed medications will improve Ability to identify triggers associated with substance abuse/mental health issues will improve     Medication Management: Evaluate patient's response, side effects, and tolerance of medication regimen.  Therapeutic Interventions: 1 to 1 sessions, Unit Group sessions and Medication administration.  Evaluation of Outcomes: Progressing   RN Treatment Plan for Primary Diagnosis: Paranoid schizophrenia (HCC) Long Term Goal(s): Knowledge of disease and therapeutic regimen to maintain health will improve  Short Term Goals: Ability to remain free from injury will improve, Ability to verbalize frustration and anger appropriately will improve, Ability to demonstrate self-control, Ability to participate in decision making will improve, Ability to verbalize feelings will improve, Ability to disclose and discuss suicidal ideas, Ability to identify and develop effective coping behaviors will improve, and Compliance with prescribed medications will improve  Medication Management: RN  will administer medications as ordered by provider, will assess and evaluate patient's response and provide education to patient for prescribed medication. RN will report any adverse and/or side effects to prescribing provider.  Therapeutic Interventions: 1 on 1 counseling sessions, Psychoeducation, Medication administration, Evaluate responses to treatment, Monitor vital signs and CBGs as ordered, Perform/monitor CIWA, COWS, AIMS and Fall Risk screenings as ordered, Perform wound care treatments as ordered.  Evaluation of Outcomes: Progressing   LCSW Treatment Plan for Primary Diagnosis: Paranoid schizophrenia (HCC) Long Term Goal(s): Safe transition to appropriate next level of care at discharge, Engage patient in therapeutic group addressing interpersonal concerns.  Short Term Goals: Engage patient in aftercare planning with referrals and resources, Increase  social support, Increase ability to appropriately verbalize feelings, Increase emotional regulation, Facilitate acceptance of mental health diagnosis and concerns, Facilitate patient progression through stages of change regarding substance use diagnoses and concerns, Identify triggers associated with mental health/substance abuse issues, and Increase skills for wellness and recovery  Therapeutic Interventions: Assess for all discharge needs, 1 to 1 time with Social worker, Explore available resources and support systems, Assess for adequacy in community support network, Educate family and significant other(s) on suicide prevention, Complete Psychosocial Assessment, Interpersonal group therapy.  Evaluation of Outcomes: Progressing   Progress in Treatment: Attending groups: Yes. and No. Participating in groups: Yes. and No. Taking medication as prescribed: Yes. Toleration medication: Yes. Family/Significant other contact made: Yes, individual(s) contacted:  Cristal Deer Mathias,father/legal guardian  Patient understands diagnosis: Yes. Discussing patient identified problems/goals with staff: Yes. Medical problems stabilized or resolved: Yes. Denies suicidal/homicidal ideation: Yes. Issues/concerns per patient self-inventory: No. Other: None   New problem(s) identified: No, Describe:  None identified Update 05/03/23: No changes at this time. Update 05/08/23: No changes at this time.   New Short Term/Long Term Goal(s):  elimination of symptoms of psychosis, medication management for mood stabilization; elimination of SI thoughts; development of comprehensive mental wellness plan. 05/03/23 Update: No changes at this time. Update 05/08/23: No changes at this time.   Patient Goals:  "To stay out of trouble and move back home" 05/03/23 Update: No changes at this time. Update 05/08/23: No changes at this time.   Discharge Plan or Barriers: CSW will assist with appropriate discharge planning 05/03/23  Update: No changes at this time. Update 05/08/23: Pt's guardian reports that he cannot come back home due to restraining order. CSW will discuss with leadership next steps for safe discharge planning    Reason for Continuation of Hospitalization: Aggression Medication stabilization   Estimated Length of Stay: 1 to 7 days 05/03/23 Update: No changes at this time. Update 05/08/23: TBD    Last 3 [place] Suicide Severity Risk Score: Flowsheet Row Admission (Current) from 04/27/2023 in [facility] BE[facility]AL [facility] ED from 12/31/2022 in [facility] ED from 12/29/2022 in [facility] Department at [facility]  C-SSRS RISK CATEGORY No Risk No Risk No Risk       Last [facility] 2/9 Scores:     No data to display          Scribe for Treatment Team: Elza Rafter, [place] 05/08/2023 11:13 AM

## 2023-05-08 NOTE — Plan of Care (Signed)
  Problem: Education: Goal: Emotional status will improve Outcome: Progressing Goal: Mental status will improve Outcome: Progressing Goal: Verbalization of understanding the information provided will improve Outcome: Progressing   Problem: Coping: Goal: Ability to verbalize frustrations and anger appropriately will improve Outcome: Progressing Goal: Ability to demonstrate self-control will improve Outcome: Progressing   Problem: Health Behavior/Discharge Planning: Goal: Compliance with treatment plan for underlying cause of condition will improve Outcome: Progressing   Problem: Physical Regulation: Goal: Ability to maintain clinical measurements within normal limits will improve Outcome: Progressing   Problem: Safety: Goal: Periods of time without injury will increase Outcome: Progressing

## 2023-05-08 NOTE — Progress Notes (Signed)
   05/08/23 0722  Psych Admission Type (Psych Patients Only)  Admission Status Involuntary  Psychosocial Assessment  Patient Complaints Agitation;Anxiety;Irritability  Eye Contact Brief  Facial Expression Anxious  Affect Labile;Irritable  Speech Pressured  Interaction Demanding;Hostile  Motor Activity Tremors  Appearance/Hygiene Disheveled  Behavior Characteristics Irritable;Anxious  Mood Anxious  Thought Process  Coherency Circumstantial;Disorganized;Tangential  Content Blaming others  Delusions None reported or observed  Perception WDL  Hallucination None reported or observed  Judgment Impaired  Confusion Mild  Danger to Self  Current suicidal ideation? Denies  Danger to Others  Danger to Others None reported or observed

## 2023-05-08 NOTE — Progress Notes (Signed)
Patient appear agitated and disrupting dayroom, hallway, nurse' station. Hypersexual; stroking his penis, yelling to staff, "Hey show me your tities, you need mine, I will give it to you". Multiple redirecting but behavior continues to escalate in the hallway and nurse' station. Security called but support. Medicated for agitation as prescribed. Will continue to monitor.

## 2023-05-08 NOTE — Progress Notes (Signed)
   05/08/23 0556  15 Minute Checks  Location Bedroom  Visual Appearance Calm  Behavior Sleeping  Sleep (Behavioral Health Patients Only)  Calculate sleep? (Click Yes once per 24 hr at 0600 safety check) Yes  Documented sleep last 24 hours 8
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
## 2023-05-09 DIAGNOSIS — F200 Paranoid schizophrenia: Secondary | ICD-10-CM | POA: Diagnosis not present

## 2023-05-09 NOTE — Progress Notes (Signed)
   05/09/23 0718  Psych Admission Type (Psych Patients Only)  Admission Status Involuntary  Psychosocial Assessment  Patient Complaints None  Eye Contact Brief  Facial Expression Grimacing  Affect Labile;Irritable;Threatening  Insurance Claims Handler;Hostile  Motor Activity Tremors  Appearance/Hygiene Disheveled  Behavior Characteristics Agressive verbally;Agitated;Hypersexual  Mood Threatening  Thought Process  Coherency Circumstantial;Disorganized;Tangential  Content Blaming others  Delusions None reported or observed  Perception WDL  Hallucination None reported or observed  Judgment Impaired  Confusion Mild  Danger to Self  Current suicidal ideation? Denies  Danger to Others  Danger to Others None reported or observed

## 2023-05-09 NOTE — Group Note (Signed)
 [facility] LCSW Group Therapy Note   Group Date: 05/09/2023 Start Time: 1300 End Time: 1330   Type of Therapy/Topic:  Group Therapy:  Emotion Regulation  Participation Level:  Minimal   Mood:  Description of Group:    The purpose of this group is to assist patients in learning to regulate negative emotions and experience positive emotions. Patients will be guided to discuss ways in which they have been vulnerable to their negative emotions. These vulnerabilities will be juxtaposed with experiences of positive emotions or situations, and patients challenged to use positive emotions to combat negative ones. Special emphasis will be placed on coping with negative emotions in conflict situations, and patients will process healthy conflict resolution skills.  Therapeutic Goals: Patient will identify two positive emotions or experiences to reflect on in order to balance out negative emotions:  Patient will label two or more emotions that they find the most difficult to experience:  Patient will be able to demonstrate positive conflict resolution skills through discussion or role plays:   Summary of Patient Progress:   Pt able to identify emotions but difficult to redirect    Therapeutic Modalities:   Cognitive Behavioral Therapy Feelings Identification Dialectical Behavioral Therapy   Mark [name], LCSWA

## 2023-05-09 NOTE — Group Note (Signed)
 Recreation Therapy Group Note   Group Topic:Personal Development  Group Date: 05/09/2023 Start Time: 1400 End Time: 1500 Facilitators: Celestia Jeoffrey BRAVO, LRT, CTRS Location:  Dayroom  Group Description: New Years Eve Handout. LRT and patients talk about the previous year and all that there is to look forward to in the coming year. LRT passes out a handout for patients to write their favorite memory, biggest lesson they've heard, biggest accomplishment, things they want to improve on, and ways to change their world around them. LRT passed out regular sized ink pens for patients to complete their worksheet with. LRT and patients discussed things that they wrote down with the group.   LRT offered to take patients outside after for fresh air and sunlight.   Goal Area(s) Addressed:  Patient will reminisce on previous year.  Patient will recall a past event in their life. Patient will identify goals for the upcoming year.  Patient will increase communication.    Affect/Mood: N/A   Participation Level: Did not attend    Clinical Observations/Individualized Feedback: Patient did not attend group.   Plan: Continue to engage patient in RT group sessions 2-3x/week.   Jeoffrey BRAVO Celestia, LRT, CTRS 05/09/2023 4:30 PM
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
## 2023-05-09 NOTE — [facility] Counselor (Signed)
 CSW completed the patients referral for [facility] at Dr. Llewellyn request.  CSW received confirmation that fax was successful.  CSW called the admissions office at [facility].  CSW spoke with staff and confirmed the receipt of the fax.    CSW completed the screening process.   Per [facility] staff the staff will review the patient's information and determine if patient is appropriate for the waitlist.    CSW provided contact information and was informed that she will received a call back.  Sherryle Margo, MSW, LCSW 05/09/2023 10:51 AM

## 2023-05-10 DIAGNOSIS — F200 Paranoid schizophrenia: Secondary | ICD-10-CM | POA: Diagnosis not present

## 2023-05-10 NOTE — Progress Notes (Signed)
   05/10/23 0959  Psych Admission Type (Psych Patients Only)  Admission Status Involuntary  Psychosocial Assessment  Patient Complaints Agitation;Anger;Irritability  Eye Contact Poor  Facial Expression Animated  Affect Angry;Irritable  Speech Argumentative;Pressured;Loud;Tangential  Interaction Sexually inappropriate;Demanding  Motor Activity Pacing;Slow;Tremors  Appearance/Hygiene Layered clothes;Disheveled  Behavior Characteristics Agitated;Irritable  Mood Angry;Irritable  Thought Process  Coherency Disorganized;Tangential  Content Blaming others;Preoccupation  Delusions None reported or observed  Perception WDL  Hallucination None reported or observed  Judgment Impaired  Confusion Mild  Danger to Self  Current suicidal ideation? Denies  Danger to Others  Danger to Others None reported or observed

## 2023-05-10 NOTE — Plan of Care (Addendum)
 Pt is alert and oriented. Pt remains irritable. Flat affect. Talking and laughing to self. Denies AV/H. Visible on the unit. Limited interaction with peers and staff. Po med compliant. PRNs given for anxiety and insomnia. Tol well. No behavior issues noted. Q 15 min checks maintained for safety. No c/o pain/discomfort noted.   Problem: Coping: Goal: Ability to verbalize frustrations and anger appropriately will improve Outcome: Progressing   Problem: Physical Regulation: Goal: Ability to maintain clinical measurements within normal limits will improve Outcome: Progressing   Problem: Safety: Goal: Periods of time without injury will increase Outcome: Progressing
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
## 2023-05-10 NOTE — Group Note (Signed)
 Date:  05/10/2023 Time:  10:50 AM  Group Topic/Focus:  Making Healthy Choices:   The focus of this group is to help patients identify negative/unhealthy choices they were using prior to admission and identify positive/healthier coping strategies to replace them upon discharge.    Participation Level:  Did Not Attend   Mark [name] 05/10/2023, 10:50 AM

## 2023-05-11 DIAGNOSIS — F200 Paranoid schizophrenia: Secondary | ICD-10-CM | POA: Diagnosis not present

## 2023-05-11 NOTE — Group Note (Signed)
 Recreation Therapy Group Note   Group Topic:Other  Group Date: 05/11/2023 Start Time: 1400 End Time: 1450 Facilitators: Celestia Jeoffrey BRAVO, LRT, CTRS Location:  Dayroom  Group Description: Seated Exercise. LRT discussed the mental and physical benefits of exercise. LRT and group discussed how physical activity can be used as a coping skill. Pt's and LRT followed along to an exercise video on the TV screen that provided a visual representation and audio description of every exercise performed. Pt's encouraged to listen to their bodies and stop at any time if they experience feelings of discomfort or pain. Pts were encouraged to drink water  and stay hydrated.   Goal Area(s) Addressed: Patient will learn benefits of physical activity. Patient will identify exercise as a coping skill.  Patient will follow multistep directions. Patient will try a new leisure interest.    Affect/Mood: N/A   Participation Level: Did not attend    Clinical Observations/Individualized Feedback: Earland did not attend group.   Plan: Continue to engage patient in RT group sessions 2-3x/week.   Jeoffrey BRAVO Celestia, LRT, CTRS 05/11/2023 4:54 PM

## 2023-05-11 NOTE — Plan of Care (Signed)
   Problem: Education: Goal: Knowledge of [facility] Education information/materials will improve Outcome: Progressing Goal: Emotional status will improve Outcome: Progressing Goal: Mental status will improve Outcome: Progressing Goal: Verbalization of understanding the information provided will improve Outcome: Progressing

## 2023-05-11 NOTE — Progress Notes (Signed)
 [facility] MD Progress Note  05/11/2023 2:10 PM Mark [name]  MRN:  [id number] Subjective: Mark [name] is seen on rounds.  He continues to be labile at times but in good controls over the most part.  He has been compliant with medications and denies any side effects.  As long as you do not talk about discharge plans he is pretty stable.  He remains in his room for the most part.  Nurses report no problems.  Social work reports no discharge plan. Principal Problem: Paranoid schizophrenia (HCC) Diagnosis: Principal Problem:   Paranoid schizophrenia (HCC)  Total Time spent with patient: 15 minutes  Past Psychiatric History: Schizophrenia  Past Medical History:  Past Medical History:  Diagnosis Date   COPD (chronic obstructive pulmonary disease) (HCC)    Hypertension    Schizophrenia (HCC)     Past Surgical History:  Procedure Laterality Date   LOWER EXTREMITY ANGIOGRAPHY N/A 09/18/2017   Procedure: LOWER EXTREMITY ANGIOGRAPHY- Recheck lysis;  Surgeon: Sheree Penne Bruckner, MD;  Location: [facility] INVASIVE CV LAB;  Service: Cardiovascular;  Laterality: N/A;   PERIPHERAL VASCULAR INTERVENTION Left 09/18/2017   Procedure: PERIPHERAL VASCULAR INTERVENTION;  Surgeon: Sheree Penne Bruckner, MD;  Location: [facility] INVASIVE CV LAB;  Service: Cardiovascular;  Laterality: Left;   THROMBECTOMY FEMORAL ARTERY Left 09/17/2017   Procedure: POSSIBLE THROMBECTOMY;  Surgeon: Sheree Penne Bruckner, MD;  Location: [facility] OR;  Service: Vascular;  Laterality: Left;   VENOGRAM Left 09/17/2017   Procedure: ULTRASOUND POPLITEAL ACCESS; CENTRAL VENOGRAM, IVVS, LYSIS CATHETER PLACEMENT;  Surgeon: Sheree Penne Bruckner, MD;  Location: [facility] OR;  Service: Vascular;  Laterality: Left;   Family History: History reviewed. No pertinent family history. Family Psychiatric  History: Unremarkable Social History:  Social History   Substance and Sexual Activity  Alcohol Use No     Social History   Substance and Sexual Activity   Drug Use Never    Social History   Socioeconomic History   Marital status: Single    Spouse name: Not on file   Number of children: Not on file   Years of education: Not on file   Highest education level: Not on file  Occupational History   Not on file  Tobacco Use   Smoking status: Every Day    Current packs/day: 0.50    Types: Cigarettes   Smokeless tobacco: Never  Vaping Use   Vaping status: Never Used  Substance and Sexual Activity   Alcohol use: No   Drug use: Never   Sexual activity: Not Currently  Other Topics Concern   Not on file  Social History Narrative   Not on file   Social Drivers of Health   Financial Resource Strain: Not on file  Food Insecurity: No Food Insecurity (04/27/2023)   Hunger Vital Sign    Worried About Running Out of Food in the Last Year: Never true    Ran Out of Food in the Last Year: Never true  Transportation Needs: No Transportation Needs (04/27/2023)   PRAPARE - Administrator, Civil Service (Medical): No    Lack of Transportation (Non-Medical): No  Physical Activity: Not on file  Stress: Not on file  Social Connections: Not on file   Additional Social History:                         Sleep: Good  Appetite:  Good  Current Medications: Current Facility-Administered Medications  Medication Dose Route Frequency Provider Last Rate Last Admin  acetaminophen  (TYLENOL ) tablet 650 mg  650 mg Oral Q6H PRN Motley-Mangrum, [name], PMHNP       alum & mag hydroxide-simeth (MAALOX/MYLANTA) 200-200-20 MG/5ML suspension 30 mL  30 mL Oral Q4H PRN Motley-Mangrum, [name], PMHNP   30 mL at 05/10/23 1410   amantadine  (SYMMETREL ) capsule 100 mg  100 mg Oral BID Victoria Ruts, MD   100 mg at 05/11/23 0915   amLODipine  (NORVASC ) tablet 5 mg  5 mg Oral Daily Parmar, Meenakshi, MD   5 mg at 05/11/23 9085   aspirin  EC tablet 81 mg  81 mg Oral Daily Parmar, Meenakshi, MD   81 mg at 05/11/23 0914   haloperidol  (HALDOL )  tablet 5 mg  5 mg Oral TID PRN Motley-Mangrum, [name], PMHNP   5 mg at 05/09/23 9166   And   diphenhydrAMINE  (BENADRYL ) capsule 50 mg  50 mg Oral TID PRN Motley-Mangrum, [name], PMHNP   50 mg at 05/09/23 9165   haloperidol  lactate (HALDOL ) injection 10 mg  10 mg Intramuscular TID PRN Motley-Mangrum, [name], PMHNP   10 mg at 05/08/23 1127   And   diphenhydrAMINE  (BENADRYL ) injection 50 mg  50 mg Intramuscular TID PRN Motley-Mangrum, [name], PMHNP       And   LORazepam  (ATIVAN ) injection 2 mg  2 mg Intramuscular TID PRN Motley-Mangrum, [name], PMHNP       haloperidol  lactate (HALDOL ) injection 5 mg  5 mg Intramuscular TID PRN Motley-Mangrum, [name], PMHNP       And   diphenhydrAMINE  (BENADRYL ) injection 50 mg  50 mg Intramuscular TID PRN Motley-Mangrum, [name], PMHNP       And   LORazepam  (ATIVAN ) injection 2 mg  2 mg Intramuscular TID PRN Motley-Mangrum, [name], PMHNP       divalproex  (DEPAKOTE  ER) 24 hr tablet 500 mg  500 mg Oral BID Motley-Mangrum, [name], PMHNP   500 mg at 05/11/23 0914   gabapentin  (NEURONTIN ) capsule 400 mg  400 mg Oral BID Motley-Mangrum, [name], PMHNP   400 mg at 05/11/23 0915   hydrOXYzine  (ATARAX ) tablet 25 mg  25 mg Oral TID PRN Motley-Mangrum, [name], PMHNP   25 mg at 05/11/23 0915   LORazepam  (ATIVAN ) tablet 1 mg  1 mg Oral Q8H PRN Motley-Mangrum, [name], PMHNP   1 mg at 05/09/23 9166   magnesium  hydroxide (MILK OF MAGNESIA) suspension 30 mL  30 mL Oral Daily PRN Motley-Mangrum, [name], PMHNP       OLANZapine  (ZYPREXA ) tablet 15 mg  15 mg Oral QHS Parmar, Meenakshi, MD   15 mg at 05/10/23 2049   traZODone  (DESYREL ) tablet 100 mg  100 mg Oral QHS PRN Motley-Mangrum, [name], PMHNP   100 mg at 05/10/23 2050   valbenazine  ([name] ) capsule 40 mg  40 mg Oral Daily Cam Charlie Loving, DO   40 mg at 05/11/23 0915    Lab Results: No results found for this or any previous visit (from the past 48 hours).  Blood Alcohol level:  Lab Results   Component Value Date   ETH <10 04/25/2023   ETH <10 12/31/2022    Metabolic Disorder Labs: Lab Results  Component Value Date   HGBA1C 5.5 12/31/2022   MPG 111.15 12/31/2022   MPG 134.11 12/29/2021   No results found for: PROLACTIN Lab Results  Component Value Date   CHOL 133 12/31/2022   TRIG 39 12/31/2022   HDL 54 12/31/2022   CHOLHDL 2.5 12/31/2022  VLDL 8 12/31/2022   LDLCALC 71 12/31/2022   LDLCALC 69 12/01/2022    Physical Findings: AIMS:  , ,  ,  ,    CIWA:    COWS:     Musculoskeletal: Strength & Muscle Tone: within normal limits Gait & Station: normal Patient leans: N/A  Psychiatric Specialty Exam:  Presentation  General Appearance:  Appropriate for Environment  Eye Contact: Good  Speech: Clear and Coherent; Slow  Speech Volume: Decreased  Handedness: Right   Mood and Affect  Mood: Dysphoric  Affect: Congruent   Thought Process  Thought Processes: Coherent  Descriptions of Associations:Circumstantial  Orientation:Full (Time, Place and Person)  Thought Content:Logical  History of Schizophrenia/Schizoaffective disorder:Yes  Duration of Psychotic Symptoms:Greater than six months  Hallucinations:No data recorded Ideas of Reference:None  Suicidal Thoughts:No data recorded Homicidal Thoughts:No data recorded  Sensorium  Memory: Immediate Fair; Recent Fair; Remote Fair  Judgment: Intact  Insight: Present   Executive Functions  Concentration: Fair  Attention Span: Fair  Recall: [employer] of Knowledge: Fair  Language: Fair   Psychomotor Activity  Psychomotor Activity:No data recorded  Assets  Assets: Communication Skills; Desire for Improvement   Sleep  Sleep:No data recorded    Blood pressure 115/71, pulse 78, temperature 98.3 F (36.8 C), resp. rate 16, height 5' 11 (1.803 m), weight 71.2 kg, SpO2 100%. Body mass index is 21.9 kg/m.   Treatment Plan Summary: Daily contact with  patient to assess and evaluate symptoms and progress in treatment, Medication management, and Plan continue current medications.  Bita Cartwright Dallas Salines, DO 05/11/2023, 2:10 PM

## 2023-05-11 NOTE — [facility] Counselor (Signed)
 CSW called [facility] and spoke with [place], [phone].  CSW confirmed patient is on the waitlist.  No information on place on list could be provided.  Penni Homans, MSW, LCSW 05/11/2023 2:51 PM

## 2023-05-11 NOTE — Group Note (Signed)
 Date:  05/10/23 Time:  09:00 pm  Group Topic/Focus:  Goals Group:   The focus of this group is to help patients establish daily goals to achieve during treatment and discuss how the patient can incorporate goal setting into their daily lives to aide in recovery.    Participation Level:  Active  Participation Quality:  Appropriate  Affect:  Appropriate  Cognitive:  Appropriate  Insight: Appropriate  Engagement in Group:  Engaged  Modes of Intervention:  Discussion  Additional Comments:    Mark [name] 05/10/23, 09:00 pm

## 2023-05-11 NOTE — Progress Notes (Signed)
   05/11/23 2100  Psych Admission Type (Psych Patients Only)  Admission Status Involuntary  Psychosocial Assessment  Patient Complaints None  Eye Contact Brief  Facial Expression Anxious  Affect Labile  Speech Pressured  Interaction Assertive;Minimal  Motor Activity Tremors  Appearance/Hygiene Improved  Behavior Characteristics Cooperative  Mood Labile  Thought Process  Coherency Disorganized  Content Blaming self  Delusions None reported or observed  Perception WDL  Hallucination None reported or observed  Judgment Impaired  Confusion Mild  Danger to Self  Current suicidal ideation? Denies  Danger to Others  Danger to Others None reported or observed

## 2023-05-11 NOTE — Plan of Care (Signed)
 D: Pt alert and oriented. Pt denies experiencing any anxiety/depression at this time. Pt denies experiencing any pain at this time. Pt denies experiencing any SI/HI, or AVH at this time.   A: Scheduled medications administered to pt, per MD orders. Support and encouragement provided. Frequent verbal contact made. Routine safety checks conducted q15 minutes.   R: No adverse drug reactions noted. Pt verbally contracts for safety at this time. Pt compliant with medications and treatment plan. Pt interacts minimally with others on the unit. Pt remains safe at this time. Plan of care ongoing.  Problem: Education: Goal: Emotional status will improve Outcome: Progressing Goal: Mental status will improve Outcome: Not Progressing

## 2023-05-12 DIAGNOSIS — F200 Paranoid schizophrenia: Secondary | ICD-10-CM | POA: Diagnosis not present

## 2023-05-12 NOTE — Plan of Care (Signed)
 D: Pt alert and oriented. Pt denies experiencing any anxiety/depression at this time. Pt denies experiencing any pain at this time. Pt denies experiencing any SI/HI, or AVH at this time however, can be observed responding at times.   A: Scheduled medications administered to pt, per MD orders. Support and encouragement provided. Frequent verbal contact made. Routine safety checks conducted q15 minutes.   R: No adverse drug reactions noted. Pt verbally contracts for safety at this time. Pt compliant with medications and treatment plan. Pt interacts minimally with others on the unit. Pt remains safe at this time. Plan of care ongoing.  Problem: Education: Goal: Emotional status will improve Outcome: Not Progressing Goal: Mental status will improve Outcome: Not Progressing

## 2023-05-12 NOTE — Plan of Care (Signed)
   Problem: Education: Goal: Knowledge of [facility] Education information/materials will improve Outcome: Progressing Goal: Emotional status will improve Outcome: Progressing Goal: Mental status will improve Outcome: Progressing Goal: Verbalization of understanding the information provided will improve Outcome: Progressing

## 2023-05-12 NOTE — Group Note (Signed)
 Date:  05/12/2023 Time:  10:37 PM  Group Topic/Focus:  Wrap-Up Group:   The focus of this group is to help patients review their daily goal of treatment and discuss progress on daily workbooks.    Participation Level:  Minimal  Participation Quality:  Appropriate  Affect:  Appropriate  Cognitive:  Alert  Insight: Appropriate  Engagement in Group:  Engaged  Modes of Intervention:  Discussion  Additional Comments:    Mark [name] CHRISTELLA Bunker 05/12/2023, 10:37 PM
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
## 2023-05-12 NOTE — Group Note (Signed)
 Recreation Therapy Group Note   Group Topic:General Recreation  Group Date: 05/12/2023 Start Time: 1400 End Time: 1450 Facilitators: Celestia Jeoffrey BRAVO, LRT, CTRS Location:  Dayroom  Group Description: Bingo. LRT and patients played multiple games of Bingo with music playing in the background. LRT and pts discussed how this could be a leisure interest and the importance of doing things they enjoy post-discharge. Pts won stress balls as chief financial officer.   Goal Area(s) Addressed: Patient will identify leisure interests.  Patient will practice healthy decision making. Patient will engage in recreation activity.  Patient will increase communication.    Affect/Mood: N/A   Participation Level: Did not attend    Clinical Observations/Individualized Feedback: Patient did not attend group.   Plan: Continue to engage patient in RT group sessions 2-3x/week.   Jeoffrey BRAVO Celestia, LRT, CTRS 05/12/2023 4:24 PM

## 2023-05-12 NOTE — Group Note (Signed)
 Date:  05/12/2023 Time:  1:04 AM  Group Topic/Focus:  Coping With Mental Health Crisis:   The purpose of this group is to help patients identify strategies for coping with mental health crisis.  Group discusses possible causes of crisis and ways to manage them effectively.    Participation Level:  Active  Participation Quality:  Appropriate  Affect:  Appropriate  Cognitive:  Appropriate  Insight: Appropriate  Engagement in Group:  Engaged  Modes of Intervention:  Education  Additional Comments:    Laymon ONEIDA Finder 05/12/2023, 1:04 AM

## 2023-05-13 DIAGNOSIS — F200 Paranoid schizophrenia: Secondary | ICD-10-CM | POA: Diagnosis not present

## 2023-05-13 NOTE — Group Note (Signed)
 Date:  05/13/2023 Time:  8:45 PM  Group Topic/Focus:  Recovery Goals:   The focus of this group is to identify appropriate goals for recovery and establish a plan to achieve them. Self Care:   The focus of this group is to help patients understand the importance of self-care in order to improve or restore emotional, physical, spiritual, interpersonal, and financial health.    Participation Level:  Did Not Attend  Additional Comments:    Kristen VEAR Gibbon 05/13/2023, 8:45 PM
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
## 2023-05-13 NOTE — Plan of Care (Signed)
 Pt is calm and cooperative. RIS. Talking and laughing to self. C/o heartburn. Maalox 30 ml given as ordered PRN. Visible in dayroom. Watching tv. Attends group. Med compliant. No behavior issues noted. Q 15 min checks maintained for safety. Denies SI/HI/AVH. Plan of care continued.   Problem: Education: Goal: Emotional status will improve Outcome: Progressing   Problem: Coping: Goal: Ability to verbalize frustrations and anger appropriately will improve Outcome: Progressing Goal: Ability to demonstrate self-control will improve Outcome: Progressing   Problem: Safety: Goal: Periods of time without injury will increase Outcome: Progressing

## 2023-05-13 NOTE — Plan of Care (Signed)
 D: Pt alert and oriented. Pt denies experiencing any anxiety/depression at this time. Pt denies experiencing any pain at this time. Pt denies experiencing any SI/HI, or AVH at this time.   A: Scheduled medications administered to pt, per MD orders. Support and encouragement provided. Frequent verbal contact made. Routine safety checks conducted q15 minutes.   R: No adverse drug reactions noted. Pt verbally contracts for safety at this time. Pt compliant with medications and treatment plan. Pt interacts minimally with others on the unit, pt self isolates to room with exception of meals and retrieving coffee. Pt remains safe at this time. Plan of care ongoing.  Problem: Coping: Goal: Ability to verbalize frustrations and anger appropriately will improve Outcome: Progressing Goal: Ability to demonstrate self-control will improve Outcome: Progressing

## 2023-05-13 NOTE — BH IP Treatment Plan (Signed)
 Interdisciplinary Treatment and Diagnostic Plan Update  05/13/2023 Time of Session: 4:17 pm STRYDER POITRA MRN: [id number]  Principal Diagnosis: Paranoid schizophrenia [facility])  Secondary Diagnoses: Principal Problem:   Paranoid schizophrenia (HCC)   Current Medications:  Current Facility-Administered Medications  Medication Dose Route Frequency Provider Last Rate Last Admin   acetaminophen  (TYLENOL ) tablet 650 mg  650 mg Oral Q6H PRN Motley-Mangrum, [name], PMHNP       alum & mag hydroxide-simeth (MAALOX/MYLANTA) 200-200-20 MG/5ML suspension 30 mL  30 mL Oral Q4H PRN Motley-Mangrum, [name], PMHNP   30 mL at 05/13/23 1135   amantadine  (SYMMETREL ) capsule 100 mg  100 mg Oral BID Victoria Ruts, MD   100 mg at 05/13/23 0911   amLODipine  (NORVASC ) tablet 5 mg  5 mg Oral Daily Parmar, Meenakshi, MD   5 mg at 05/13/23 0911   aspirin  EC tablet 81 mg  81 mg Oral Daily Parmar, Meenakshi, MD   81 mg at 05/13/23 0911   haloperidol  (HALDOL ) tablet 5 mg  5 mg Oral TID PRN Motley-Mangrum, [name], PMHNP   5 mg at 05/09/23 9166   And   diphenhydrAMINE  (BENADRYL ) capsule 50 mg  50 mg Oral TID PRN Motley-Mangrum, [name], PMHNP   50 mg at 05/09/23 9165   haloperidol  lactate (HALDOL ) injection 10 mg  10 mg Intramuscular TID PRN Motley-Mangrum, [name], PMHNP   10 mg at 05/08/23 1127   And   diphenhydrAMINE  (BENADRYL ) injection 50 mg  50 mg Intramuscular TID PRN Motley-Mangrum, [name], PMHNP       And   LORazepam  (ATIVAN ) injection 2 mg  2 mg Intramuscular TID PRN Motley-Mangrum, [name], PMHNP       haloperidol  lactate (HALDOL ) injection 5 mg  5 mg Intramuscular TID PRN Motley-Mangrum, [name], PMHNP       And   diphenhydrAMINE  (BENADRYL ) injection 50 mg  50 mg Intramuscular TID PRN Motley-Mangrum, [name], PMHNP       And   LORazepam  (ATIVAN ) injection 2 mg  2 mg Intramuscular TID PRN Motley-Mangrum, [name], PMHNP       divalproex  (DEPAKOTE  ER) 24 hr tablet 500 mg  500 mg Oral BID  Motley-Mangrum, [name], PMHNP   500 mg at 05/13/23 0911   gabapentin  (NEURONTIN ) capsule 400 mg  400 mg Oral BID Motley-Mangrum, [name], PMHNP   400 mg at 05/13/23 0911   hydrOXYzine  (ATARAX ) tablet 25 mg  25 mg Oral TID PRN Motley-Mangrum, [name], PMHNP   25 mg at 05/13/23 0911   LORazepam  (ATIVAN ) tablet 1 mg  1 mg Oral Q8H PRN Motley-Mangrum, [name], PMHNP   1 mg at 05/09/23 9166   magnesium  hydroxide (MILK OF MAGNESIA) suspension 30 mL  30 mL Oral Daily PRN Motley-Mangrum, [name], PMHNP       OLANZapine  (ZYPREXA ) tablet 15 mg  15 mg Oral QHS Parmar, Meenakshi, MD   15 mg at 05/12/23 2114   traZODone  (DESYREL ) tablet 100 mg  100 mg Oral QHS PRN Motley-Mangrum, [name], PMHNP   100 mg at 05/11/23 2114   valbenazine  ([name] ) capsule 40 mg  40 mg Oral Daily Cam Charlie Loving, DO   40 mg at 05/13/23 0911   PTA Medications: Medications Prior to Admission  Medication Sig Dispense Refill Last Dose/Taking   amantadine  (SYMMETREL ) 100 MG capsule Take 100 mg by mouth 2 (two) times daily.      amLODipine  (NORVASC ) 5 MG tablet Take 1 tablet (5 mg total)  by mouth daily.      aspirin  EC 81 MG tablet Take 81 mg by mouth daily. Swallow whole.      benztropine  (COGENTIN ) 1 MG tablet Take 1 tablet (1 mg total) by mouth 2 (two) times daily. 60 tablet 0    cyclobenzaprine  (FLEXERIL ) 10 MG tablet Take 10 mg by mouth 2 (two) times daily as needed.      divalproex  (DEPAKOTE  ER) 500 MG 24 hr tablet Take 1 tablet (500 mg total) by mouth 2 (two) times daily. (Patient not taking: Reported on 04/25/2023) 60 tablet 0    gabapentin  (NEURONTIN ) 300 MG capsule Take 300 mg by mouth 3 (three) times daily. (Patient not taking: Reported on 04/25/2023)      gabapentin  (NEURONTIN ) 400 MG capsule Take 1 capsule (400 mg total) by mouth 3 (three) times daily. (Patient not taking: Reported on 04/25/2023) 90 capsule 0    haloperidol  (HALDOL ) 10 MG tablet Take 1 tablet (10 mg total) by mouth 2 (two) times daily.  (Patient not taking: Reported on 04/25/2023)      traZODone  (DESYREL ) 150 MG tablet Take 1 tablet (150 mg total) by mouth at bedtime. (Patient not taking: Reported on 04/25/2023) 30 tablet 0     Patient Stressors: Marital or family conflict   Medication change or noncompliance    Patient Strengths: Supportive family/friends   Treatment Modalities: Medication Management, Group therapy, Case management,  1 to 1 session with clinician, Psychoeducation, Recreational therapy.   Physician Treatment Plan for Primary Diagnosis: Paranoid schizophrenia (HCC) Long Term Goal(s): Improvement in symptoms so as ready for discharge   Short Term Goals: Ability to identify changes in lifestyle to reduce recurrence of condition will improve Ability to verbalize feelings will improve Ability to disclose and discuss suicidal ideas Ability to demonstrate self-control will improve Ability to identify and develop effective coping behaviors will improve Ability to maintain clinical measurements within normal limits will improve Compliance with prescribed medications will improve Ability to identify triggers associated with substance abuse/mental health issues will improve  Medication Management: Evaluate patient's response, side effects, and tolerance of medication regimen.  Therapeutic Interventions: 1 to 1 sessions, Unit Group sessions and Medication administration.  Evaluation of Outcomes: Progressing  Physician Treatment Plan for Secondary Diagnosis: Principal Problem:   Paranoid schizophrenia (HCC)  Long Term Goal(s): Improvement in symptoms so as ready for discharge   Short Term Goals: Ability to identify changes in lifestyle to reduce recurrence of condition will improve Ability to verbalize feelings will improve Ability to disclose and discuss suicidal ideas Ability to demonstrate self-control will improve Ability to identify and develop effective coping behaviors will improve Ability to  maintain clinical measurements within normal limits will improve Compliance with prescribed medications will improve Ability to identify triggers associated with substance abuse/mental health issues will improve     Medication Management: Evaluate patient's response, side effects, and tolerance of medication regimen.  Therapeutic Interventions: 1 to 1 sessions, Unit Group sessions and Medication administration.  Evaluation of Outcomes: Progressing   RN Treatment Plan for Primary Diagnosis: Paranoid schizophrenia (HCC) Long Term Goal(s): Knowledge of disease and therapeutic regimen to maintain health will improve  Short Term Goals: Ability to remain free from injury will improve, Ability to verbalize frustration and anger appropriately will improve, Ability to demonstrate self-control, Ability to participate in decision making will improve, Ability to verbalize feelings will improve, Ability to disclose and discuss suicidal ideas, Ability to identify and develop effective coping behaviors will improve, and Compliance with  prescribed medications will improve  Medication Management: RN will administer medications as ordered by provider, will assess and evaluate patient's response and provide education to patient for prescribed medication. RN will report any adverse and/or side effects to prescribing provider.  Therapeutic Interventions: 1 on 1 counseling sessions, Psychoeducation, Medication administration, Evaluate responses to treatment, Monitor vital signs and CBGs as ordered, Perform/monitor CIWA, COWS, AIMS and Fall Risk screenings as ordered, Perform wound care treatments as ordered.  Evaluation of Outcomes: Progressing   LCSW Treatment Plan for Primary Diagnosis: Paranoid schizophrenia (HCC) Long Term Goal(s): Safe transition to appropriate next level of care at discharge, Engage patient in therapeutic group addressing interpersonal concerns.  Short Term Goals: Engage patient in  aftercare planning with referrals and resources, Increase social support, Increase ability to appropriately verbalize feelings, Increase emotional regulation, Facilitate acceptance of mental health diagnosis and concerns, Facilitate patient progression through stages of change regarding substance use diagnoses and concerns, Identify triggers associated with mental health/substance abuse issues, and Increase skills for wellness and recovery  Therapeutic Interventions: Assess for all discharge needs, 1 to 1 time with Social worker, Explore available resources and support systems, Assess for adequacy in community support network, Educate family and significant other(s) on suicide prevention, Complete Psychosocial Assessment, Interpersonal group therapy.  Evaluation of Outcomes: Progressing   Progress in Treatment: Attending groups: Yes. Participating in groups: Yes. Taking medication as prescribed: Yes. Toleration medication: Yes. Family/Significant other contact made: Yes, individual(s) contacted:  Donnie Panik [phone]) Patient understands diagnosis: Yes. Discussing patient identified problems/goals with staff: Yes. Medical problems stabilized or resolved: Yes. Denies suicidal/homicidal ideation: Yes. Issues/concerns per patient self-inventory: No. Other: None  New problem(s) identified: No, Describe:  None identified Update 05/03/23: No changes at this time. Update 05/08/23: No changes at this time. Update 05/13/2023: No changes at this time.   New Short Term/Long Term Goal(s):  elimination of symptoms of psychosis, medication management for mood stabilization; elimination of SI thoughts; development of comprehensive mental wellness plan. 05/03/23 Update: No changes at this time. Update 05/08/23: No changes at this time.   Update 05/13/2023: No changes at this time.   Patient Goals:  To stay out of trouble and move back home 05/03/23 Update: No changes at this time.  Update 05/08/23: No changes at this time.   Update 05/13/2023: No changes at this time.   Discharge Plan or Barriers: CSW will assist with appropriate discharge planning 05/03/23 Update: No changes at this time. Update 05/08/23: Pt's guardian reports that he cannot come back home due to restraining order. CSW will discuss with leadership next steps for safe discharge planning. Update 05/13/2023: No changes at this time.   Reason for Continuation of Hospitalization: Aggression Medication stabilization   Estimated Length of Stay: 1 to 7 days 05/03/23 Update: No changes at this time. Update 05/08/23: TBD  Update 05/13/2023: TBD  Last 3 [place] Suicide Severity Risk Score: Flowsheet Row Admission (Current) from 04/27/2023 in [facility] ED from 12/31/2022 in [facility] ED from 12/29/2022 in [facility] Emergency Department at [facility]  C-SSRS RISK CATEGORY No Risk No Risk No Risk       Last [facility] 2/9 Scores:     No data to display          Scribe for Treatment Team: Roselyn GORMAN Lento, KEN 05/13/2023 4:17 PM

## 2023-05-13 NOTE — Progress Notes (Signed)
D: Pt alert and oriented. Pt denies experiencing any anxiety/depression at this time. Pt denies experiencing any pain at this time. Pt denies experiencing any SI/HI, or AVH at this time.    A: Scheduled medications administered to pt, per MD orders. Support and encouragement provided. Frequent verbal contact made. Routine safety checks conducted q15 minutes.   R: No adverse drug reactions noted. Pt verbally contracts for safety at this time. Pt complaint with medications. Pt interacts minimally with others on the unit. Pt remains safe at this time.  

## 2023-05-14 DIAGNOSIS — F200 Paranoid schizophrenia: Secondary | ICD-10-CM | POA: Diagnosis not present

## 2023-05-14 NOTE — Progress Notes (Signed)
   05/14/23 1000  Psych Admission Type (Psych Patients Only)  Admission Status Involuntary  Psychosocial Assessment  Patient Complaints None  Eye Contact Brief  Facial Expression Anxious  Affect Irritable  Speech Pressured  Interaction Minimal  Motor Activity Tremors  Appearance/Hygiene Improved  Behavior Characteristics Appropriate to situation  Mood Labile  Thought Process  Coherency Disorganized  Content Blaming others  Delusions None reported or observed  Perception WDL  Hallucination None reported or observed  Judgment Impaired  Confusion Mild  Danger to Self  Current suicidal ideation? Denies  Danger to Others  Danger to Others None reported or observed

## 2023-05-14 NOTE — B[facility] Group Notes (Signed)
 LCSW Wellness Group Note   05/14/2023 1:00pm  Type of Group and Topic: Psychoeducational Group:  Wellness  Participation Level:  did not attend  Description of Group  Wellness group introduces the topic and its focus on developing healthy habits across the spectrum and its relationship to a decrease in hospital admissions.  Six areas of wellness are discussed: physical, social spiritual, intellectual, occupational, and emotional.  Patients are asked to consider their current wellness habits and to identify areas of wellness where they are interested and able to focus on improvements.    Therapeutic Goals Patients will understand components of wellness and how they can positively impact overall health.  Patients will identify areas of wellness where they have developed good habits. Patients will identify areas of wellness where they would like to make improvements.    Summary of Patient Progress     Therapeutic Modalities: Cognitive Behavioral Therapy Psychoeducation    Bridget Cordella Simmonds, LCSW

## 2023-05-14 NOTE — Progress Notes (Signed)
 [facility] MD Progress Note  05/14/2023 10:42 AM Mark [name]  MRN:  [id number] Subjective: Mark [name] is seen on rounds.  Nurses report no problems.  He has no complaints.  He is not able to go back home to his dad.  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  [facility] and social work is going to [employer] to speed up the process.  He has been compliant with medications Principal Problem: Paranoid schizophrenia (HCC) Diagnosis: Principal Problem:   Paranoid schizophrenia (HCC)  Total Time spent with patient: 15 minutes  Past Psychiatric History: Schizophrenia  Past Medical History:  Past Medical History:  Diagnosis Date   COPD (chronic obstructive pulmonary disease) (HCC)    Hypertension    Schizophrenia (HCC)     Past Surgical History:  Procedure Laterality Date   LOWER EXTREMITY ANGIOGRAPHY N/A 09/18/2017   Procedure: LOWER EXTREMITY ANGIOGRAPHY- Recheck lysis;  Surgeon: Sheree Penne Bruckner, MD;  Location: [facility] INVASIVE CV LAB;  Service: Cardiovascular;  Laterality: N/A;   PERIPHERAL VASCULAR INTERVENTION Left 09/18/2017   Procedure: PERIPHERAL VASCULAR INTERVENTION;  Surgeon: Sheree Penne Bruckner, MD;  Location: [facility] INVASIVE CV LAB;  Service: Cardiovascular;  Laterality: Left;   THROMBECTOMY FEMORAL ARTERY Left 09/17/2017   Procedure: POSSIBLE THROMBECTOMY;  Surgeon: Sheree Penne Bruckner, MD;  Location: [facility] OR;  Service: Vascular;  Laterality: Left;   VENOGRAM Left 09/17/2017   Procedure: ULTRASOUND POPLITEAL ACCESS; CENTRAL VENOGRAM, IVVS, LYSIS CATHETER PLACEMENT;  Surgeon: Sheree Penne Bruckner, MD;  Location: [facility] OR;  Service: Vascular;  Laterality: Left;   Family History: History reviewed. No pertinent family history. Family Psychiatric  History: Unremarkable Social History:  Social History   Substance and Sexual Activity  Alcohol Use No     Social History   Substance and Sexual Activity  Drug Use Never     Social History   Socioeconomic History   Marital status: Single    Spouse name: Not on file   Number of children: Not on file   Years of education: Not on file   Highest education level: Not on file  Occupational History   Not on file  Tobacco Use   Smoking status: Every Day    Current packs/day: 0.50    Types: Cigarettes   Smokeless tobacco: Never  Vaping Use   Vaping status: Never Used  Substance and Sexual Activity   Alcohol use: No   Drug use: Never   Sexual activity: Not Currently  Other Topics Concern   Not on file  Social History Narrative   Not on file   Social Drivers of Health   Financial Resource Strain: Not on file  Food Insecurity: No Food Insecurity (04/27/2023)   Hunger Vital Sign    Worried About Running Out of Food in the Last Year: Never true    Ran Out of Food in the Last Year: Never true  Transportation Needs: No Transportation Needs (04/27/2023)   PRAPARE - Administrator, Civil Service (Medical): No    Lack of Transportation (Non-Medical): No  Physical Activity: Not on file  Stress: Not on file  Social Connections: Not on file   Additional Social History:                         Sleep: Good  Appetite:  Good  Current Medications: Current Facility-Administered Medications  Medication Dose Route Frequency Provider Last Rate Last Admin  acetaminophen  (TYLENOL ) tablet 650 mg  650 mg Oral Q6H PRN Motley-Mangrum, [name], PMHNP       alum & mag hydroxide-simeth (MAALOX/MYLANTA) 200-200-20 MG/5ML suspension 30 mL  30 mL Oral Q4H PRN Motley-Mangrum, [name], PMHNP   30 mL at 05/13/23 1950   amantadine  (SYMMETREL ) capsule 100 mg  100 mg Oral BID Victoria Ruts, MD   100 mg at 05/14/23 0856   amLODipine  (NORVASC ) tablet 5 mg  5 mg Oral Daily Parmar, Meenakshi, MD   5 mg at 05/14/23 0856   aspirin  EC tablet 81 mg  81 mg Oral Daily Parmar, Meenakshi, MD   81 mg at 05/14/23 0856   haloperidol  (HALDOL ) tablet 5 mg  5 mg Oral  TID PRN Motley-Mangrum, [name], PMHNP   5 mg at 05/09/23 9166   And   diphenhydrAMINE  (BENADRYL ) capsule 50 mg  50 mg Oral TID PRN Motley-Mangrum, [name], PMHNP   50 mg at 05/09/23 9165   haloperidol  lactate (HALDOL ) injection 10 mg  10 mg Intramuscular TID PRN Motley-Mangrum, [name], PMHNP   10 mg at 05/08/23 1127   And   diphenhydrAMINE  (BENADRYL ) injection 50 mg  50 mg Intramuscular TID PRN Motley-Mangrum, [name], PMHNP       And   LORazepam  (ATIVAN ) injection 2 mg  2 mg Intramuscular TID PRN Motley-Mangrum, [name], PMHNP       haloperidol  lactate (HALDOL ) injection 5 mg  5 mg Intramuscular TID PRN Motley-Mangrum, [name], PMHNP       And   diphenhydrAMINE  (BENADRYL ) injection 50 mg  50 mg Intramuscular TID PRN Motley-Mangrum, [name], PMHNP       And   LORazepam  (ATIVAN ) injection 2 mg  2 mg Intramuscular TID PRN Motley-Mangrum, [name], PMHNP       divalproex  (DEPAKOTE  ER) 24 hr tablet 500 mg  500 mg Oral BID Motley-Mangrum, [name], PMHNP   500 mg at 05/14/23 0856   gabapentin  (NEURONTIN ) capsule 400 mg  400 mg Oral BID Motley-Mangrum, [name], PMHNP   400 mg at 05/14/23 0856   hydrOXYzine  (ATARAX ) tablet 25 mg  25 mg Oral TID PRN Motley-Mangrum, [name], PMHNP   25 mg at 05/14/23 1019   LORazepam  (ATIVAN ) tablet 1 mg  1 mg Oral Q8H PRN Motley-Mangrum, [name], PMHNP   1 mg at 05/09/23 9166   magnesium  hydroxide (MILK OF MAGNESIA) suspension 30 mL  30 mL Oral Daily PRN Motley-Mangrum, [name], PMHNP       OLANZapine  (ZYPREXA ) tablet 15 mg  15 mg Oral QHS Parmar, Meenakshi, MD   15 mg at 05/13/23 2118   traZODone  (DESYREL ) tablet 100 mg  100 mg Oral QHS PRN Motley-Mangrum, [name], PMHNP   100 mg at 05/13/23 2118   valbenazine  ([name] ) capsule 40 mg  40 mg Oral Daily Cam Charlie Loving, DO   40 mg at 05/14/23 9143    Lab Results: No results found for this or any previous visit (from the past 48 hours).  Blood Alcohol level:  Lab Results  Component Value Date    ETH <10 04/25/2023   ETH <10 12/31/2022    Metabolic Disorder Labs: Lab Results  Component Value Date   HGBA1C 5.5 12/31/2022   MPG 111.15 12/31/2022   MPG 134.11 12/29/2021   No results found for: PROLACTIN Lab Results  Component Value Date   CHOL 133 12/31/2022   TRIG 39 12/31/2022   HDL 54 12/31/2022   CHOLHDL 2.5 12/31/2022  VLDL 8 12/31/2022   LDLCALC 71 12/31/2022   LDLCALC 69 12/01/2022    Physical Findings: AIMS:  , ,  ,  ,    CIWA:    COWS:     Musculoskeletal: Strength & Muscle Tone: within normal limits Gait & Station: normal Patient leans: N/A  Psychiatric Specialty Exam:  Presentation  General Appearance:  Appropriate for Environment  Eye Contact: Good  Speech: Clear and Coherent; Slow  Speech Volume: Decreased  Handedness: Right   Mood and Affect  Mood: Dysphoric  Affect: Congruent   Thought Process  Thought Processes: Coherent  Descriptions of Associations:Circumstantial  Orientation:Full (Time, Place and Person)  Thought Content:Logical  History of Schizophrenia/Schizoaffective disorder:Yes  Duration of Psychotic Symptoms:Greater than six months  Hallucinations:No data recorded Ideas of Reference:None  Suicidal Thoughts:No data recorded Homicidal Thoughts:No data recorded  Sensorium  Memory: Immediate Fair; Recent Fair; Remote Fair  Judgment: Intact  Insight: Present   Executive Functions  Concentration: Fair  Attention Span: Fair  Recall: [employer] of Knowledge: Fair  Language: Fair   Psychomotor Activity  Psychomotor Activity:No data recorded  Assets  Assets: Communication Skills; Desire for Improvement   Sleep  Sleep:No data recorded    Blood pressure 137/67, pulse (!) 59, temperature (!) 97.3 F (36.3 C), resp. rate 16, height 5' 11 (1.803 m), weight 71.2 kg, SpO2 100%. Body mass index is 21.9 kg/m.   Treatment Plan Summary: Daily contact with patient to assess  and evaluate symptoms and progress in treatment, Medication management, and Plan continue current medications.  Charlie Dallas Salines, DO 05/14/2023, 10:42 AM

## 2023-05-14 NOTE — Plan of Care (Signed)
   Problem: Coping: Goal: Ability to verbalize frustrations and anger appropriately will improve Outcome: Progressing Goal: Ability to demonstrate self-control will improve Outcome: Progressing

## 2023-05-14 NOTE — Group Note (Signed)
 Date:  05/14/2023 Time:  9:14 PM  Group Topic/Focus:  Wrap-Up Group:   The focus of this group is to help patients review their daily goal of treatment and discuss progress on daily workbooks.    Participation Level:  Did Not Attend  Participation Quality:      Affect:      Cognitive:      Insight: None  Engagement in Group:  None  Modes of Intervention:      Additional Comments:    Mark [name] 05/14/2023, 9:14 PM

## 2023-05-15 DIAGNOSIS — F200 Paranoid schizophrenia: Secondary | ICD-10-CM | POA: Diagnosis not present

## 2023-05-15 NOTE — Progress Notes (Signed)
   05/15/23 2100  Psych Admission Type (Psych Patients Only)  Admission Status Involuntary  Psychosocial Assessment  Patient Complaints None  Eye Contact Brief  Facial Expression Anxious;Animated  Affect Anxious  Speech Pressured  Interaction Minimal  Motor Activity Tremors  Appearance/Hygiene Improved  Behavior Characteristics Appropriate to situation;Cooperative  Mood Anxious;Pleasant  Thought Process  Coherency Disorganized  Content Blaming others  Delusions None reported or observed  Perception WDL  Hallucination None reported or observed  Judgment Impaired  Confusion Mild  Danger to Self  Current suicidal ideation? Denies  Danger to Others  Danger to Others None reported or observed

## 2023-05-15 NOTE — Progress Notes (Signed)
 [facility] MD Progress Note  05/15/2023 12:32 PM Mark [name]  MRN:  [id number] Subjective: Mark [name] is seen on rounds.  He is out of his room walking the halls and shakes my hand.  He is in a good mood today.  Has been compliant with medications.  He has been in good controls.  He is on the list to go to the state hospital.  He is not allowed to go home because he broke his Mark [name]'s jaw and detaches retina.  Social work is supposed to contact Mark [name] company to facilitate him going to the state hospital because he will be a danger to discharged to a shelter. Principal Problem: Paranoid schizophrenia (HCC) Diagnosis: Principal Problem:   Paranoid schizophrenia (HCC)  Total Time spent with patient: 15 minutes  Past Psychiatric History: Schizophrenia  Past Medical History:  Past Medical History:  Diagnosis Date   COPD (chronic obstructive pulmonary disease) (HCC)    Hypertension    Schizophrenia (HCC)     Past Surgical History:  Procedure Laterality Date   LOWER EXTREMITY ANGIOGRAPHY N/A 09/18/2017   Procedure: LOWER EXTREMITY ANGIOGRAPHY- Recheck lysis;  Surgeon: Sheree Penne Bruckner, MD;  Location: [facility] INVASIVE CV LAB;  Service: Cardiovascular;  Laterality: N/A;   PERIPHERAL VASCULAR INTERVENTION Left 09/18/2017   Procedure: PERIPHERAL VASCULAR INTERVENTION;  Surgeon: Sheree Penne Bruckner, MD;  Location: [facility] INVASIVE CV LAB;  Service: Cardiovascular;  Laterality: Left;   THROMBECTOMY FEMORAL ARTERY Left 09/17/2017   Procedure: POSSIBLE THROMBECTOMY;  Surgeon: Sheree Penne Bruckner, MD;  Location: [facility] OR;  Service: Vascular;  Laterality: Left;   VENOGRAM Left 09/17/2017   Procedure: ULTRASOUND POPLITEAL ACCESS; CENTRAL VENOGRAM, IVVS, LYSIS CATHETER PLACEMENT;  Surgeon: Sheree Penne Bruckner, MD;  Location: [facility] OR;  Service: Vascular;  Laterality: Left;   Family History: History reviewed. No pertinent family history. Family Psychiatric  History: Unremarkable Social History:   Social History   Substance and Sexual Activity  Alcohol Use No     Social History   Substance and Sexual Activity  Drug Use Never    Social History   Socioeconomic History   Marital status: Single    Spouse name: Not on file   Number of children: Not on file   Years of education: Not on file   Highest education level: Not on file  Occupational History   Not on file  Tobacco Use   Smoking status: Every Day    Current packs/day: 0.50    Types: Cigarettes   Smokeless tobacco: Never  Vaping Use   Vaping status: Never Used  Substance and Sexual Activity   Alcohol use: No   Drug use: Never   Sexual activity: Not Currently  Other Topics Concern   Not on file  Social History Narrative   Not on file   Social Drivers of Health   Financial Resource Strain: Not on file  Food Insecurity: No Food Insecurity (04/27/2023)   Hunger Vital Sign    Worried About Running Out of Food in the Last Year: Never true    Ran Out of Food in the Last Year: Never true  Transportation Needs: No Transportation Needs (04/27/2023)   PRAPARE - Administrator, Civil Service (Medical): No    Lack of Transportation (Non-Medical): No  Physical Activity: Not on file  Stress: Not on file  Social Connections: Not on file   Additional Social History:  Sleep: Good  Appetite:  Good  Current Medications: Current Facility-Administered Medications  Medication Dose Route Frequency Provider Last Rate Last Admin   acetaminophen  (TYLENOL ) tablet 650 mg  650 mg Oral Q6H PRN Motley-Mangrum, [name], PMHNP       alum & mag hydroxide-simeth (MAALOX/MYLANTA) 200-200-20 MG/5ML suspension 30 mL  30 mL Oral Q4H PRN Motley-Mangrum, [name], PMHNP   30 mL at 05/13/23 1950   amantadine  (SYMMETREL ) capsule 100 mg  100 mg Oral BID Parmar, Meenakshi, MD   100 mg at 05/15/23 0930   amLODipine  (NORVASC ) tablet 5 mg  5 mg Oral Daily Parmar, Meenakshi, MD   5 mg at 05/15/23  0930   aspirin  EC tablet 81 mg  81 mg Oral Daily Parmar, Meenakshi, MD   81 mg at 05/15/23 0930   haloperidol  (HALDOL ) tablet 5 mg  5 mg Oral TID PRN Motley-Mangrum, [name], PMHNP   5 mg at 05/09/23 9166   And   diphenhydrAMINE  (BENADRYL ) capsule 50 mg  50 mg Oral TID PRN Motley-Mangrum, [name], PMHNP   50 mg at 05/09/23 9165   haloperidol  lactate (HALDOL ) injection 10 mg  10 mg Intramuscular TID PRN Motley-Mangrum, [name], PMHNP   10 mg at 05/08/23 1127   And   diphenhydrAMINE  (BENADRYL ) injection 50 mg  50 mg Intramuscular TID PRN Motley-Mangrum, [name], PMHNP       And   LORazepam  (ATIVAN ) injection 2 mg  2 mg Intramuscular TID PRN Motley-Mangrum, [name], PMHNP       haloperidol  lactate (HALDOL ) injection 5 mg  5 mg Intramuscular TID PRN Motley-Mangrum, [name], PMHNP       And   diphenhydrAMINE  (BENADRYL ) injection 50 mg  50 mg Intramuscular TID PRN Motley-Mangrum, [name], PMHNP       And   LORazepam  (ATIVAN ) injection 2 mg  2 mg Intramuscular TID PRN Motley-Mangrum, [name], PMHNP       divalproex  (DEPAKOTE  ER) 24 hr tablet 500 mg  500 mg Oral BID Motley-Mangrum, [name], PMHNP   500 mg at 05/15/23 0930   gabapentin  (NEURONTIN ) capsule 400 mg  400 mg Oral BID Motley-Mangrum, [name], PMHNP   400 mg at 05/15/23 0930   hydrOXYzine  (ATARAX ) tablet 25 mg  25 mg Oral TID PRN Motley-Mangrum, [name], PMHNP   25 mg at 05/15/23 0930   LORazepam  (ATIVAN ) tablet 1 mg  1 mg Oral Q8H PRN Motley-Mangrum, [name], PMHNP   1 mg at 05/09/23 9166   magnesium  hydroxide (MILK OF MAGNESIA) suspension 30 mL  30 mL Oral Daily PRN Motley-Mangrum, [name], PMHNP       OLANZapine  (ZYPREXA ) tablet 15 mg  15 mg Oral QHS Parmar, Meenakshi, MD   15 mg at 05/14/23 2008   traZODone  (DESYREL ) tablet 100 mg  100 mg Oral QHS PRN Motley-Mangrum, [name], PMHNP   100 mg at 05/13/23 2118   valbenazine  ([name] ) capsule 40 mg  40 mg Oral Daily Cam Charlie Loving, DO   40 mg at 05/15/23 0930    Lab  Results: No results found for this or any previous visit (from the past 48 hours).  Blood Alcohol level:  Lab Results  Component Value Date   [facility] <10 04/25/2023   ETH <10 12/31/2022    Metabolic Disorder Labs: Lab Results  Component Value Date   HGBA1C 5.5 12/31/2022   MPG 111.15 12/31/2022   MPG 134.11 12/29/2021   No results found for: PROLACTIN Lab Results  Component Value Date   CHOL 133 12/31/2022   TRIG 39 12/31/2022   HDL 54 12/31/2022   CHOLHDL 2.5 12/31/2022   VLDL 8 12/31/2022   LDLCALC 71 12/31/2022   LDLCALC 69 12/01/2022    Physical Findings: AIMS:  , ,  ,  ,    CIWA:    COWS:     Musculoskeletal: Strength & Muscle Tone: within normal limits Gait & Station: normal Patient leans: N/A  Psychiatric Specialty Exam:  Presentation  General Appearance:  Appropriate for Environment  Eye Contact: Good  Speech: Clear and Coherent; Slow  Speech Volume: Decreased  Handedness: Right   Mood and Affect  Mood: Dysphoric  Affect: Congruent   Thought Process  Thought Processes: Coherent  Descriptions of Associations:Circumstantial  Orientation:Full (Time, Place and Person)  Thought Content:Logical  History of Schizophrenia/Schizoaffective disorder:Yes  Duration of Psychotic Symptoms:Greater than six months  Hallucinations:No data recorded Ideas of Reference:None  Suicidal Thoughts:No data recorded Homicidal Thoughts:No data recorded  Sensorium  Memory: Immediate Fair; Recent Fair; Remote Fair  Judgment: Intact  Insight: Present   Executive Functions  Concentration: Fair  Attention Span: Fair  Recall: [employer] of Knowledge: Fair  Language: Fair   Psychomotor Activity  Psychomotor Activity:No data recorded  Assets  Assets: Communication Skills; Desire for Improvement   Sleep  Sleep:No data recorded   MENTAL STATUS EXAM: Patient is alert and oriented x 3, pleasant and cooperative, good eye  contact, speech is normal and not pressured, mood is depressed; affect is flat; thought process: goal directed; thought content: He denies suicidal ideation; judgment is poor, insight is poor. Blood pressure 118/73, pulse 70, temperature 97.6 F (36.4 C), resp. rate 16, height 5' 11 (1.803 m), weight 71.2 kg, SpO2 99%. Body mass index is 21.9 kg/m.   Treatment Plan Summary: Daily contact with patient to assess and evaluate symptoms and progress in treatment, Medication management, and Plan continue current medication.  [facility] to the state hospital social work to [employer].  Charlie Dallas Salines, DO 05/15/2023, 12:32 PM

## 2023-05-15 NOTE — BHH Counselor (Addendum)
 CSW contacted [facility] [employer] at [phone] to get in touch with pt's care coordinator to assist with placement.   They report he does not have a care manager or care coordinator assigned to him.   CSW completed request form for a care coordinator with [facility] representative over the phone.   Representative reports that Jarrell will contact CSW once pt gets a gaffer.    ADDENDUM:   Call reference number #[id number]  Lum Croft, MSW, [facility] 05/15/2023 9:44 AM

## 2023-05-15 NOTE — Progress Notes (Signed)
   05/15/23 1100  Psych Admission Type (Psych Patients Only)  Admission Status Involuntary  Psychosocial Assessment  Patient Complaints None  Eye Contact Brief  Facial Expression Anxious  Affect Irritable  Speech Pressured  Interaction Minimal  Motor Activity Tremors  Appearance/Hygiene Improved  Behavior Characteristics Appropriate to situation  Mood Pleasant  Thought Process  Coherency Disorganized  Content Blaming others  Delusions None reported or observed  Perception WDL  Hallucination None reported or observed  Judgment Impaired  Confusion Mild  Danger to Self  Current suicidal ideation? Denies  Danger to Others  Danger to Others None reported or observed

## 2023-05-15 NOTE — Group Note (Signed)
 Recreation Therapy Group Note   Group Topic:Coping Skills  Group Date: 05/15/2023 Start Time: 1500 End Time: 1600 Facilitators: Celestia Jeoffrey BRAVO, LRT, CTRS Location:  Dayroom  Group Description: Mind Map.  Patient was provided a blank template of a diagram with 32 blank boxes in a tiered system, branching from the center (similar to a bubble chart). LRT directed patients to label the middle of the diagram Coping Skills. LRT and patients then came up with 8 different coping skills as examples. Pt were directed to record their coping skills in the 2nd tier boxes closest to the center.  Patients would then share their coping skills with the group as LRT wrote them out. LRT gave a handout of 99 different coping skills at the end of group.   Goal Area(s) Addressed: Patients will be able to define "coping skills". Patient will identify new coping skills.  Patient will increase communication.   Affect/Mood: N/A   Participation Level: Did not attend    Clinical Observations/Individualized Feedback: Mark [name] did not attend group.   Plan: Continue to engage patient in RT group sessions 2-3x/week.   Jeoffrey BRAVO Celestia, LRT, CTRS 05/15/2023 4:42 PM

## 2023-05-15 NOTE — BHH Counselor (Signed)
 CSW received call from RN Case Manager [phone])with [facility]. They report they need to know what unit pt is currently on to get him a futures trader.   CSW informed them that pt is currently on geriatric psychiatry unit at [facility].   CSW awaits word from [facility] about who pt's care manager will be.   Lum Croft, MSW, [place] 05/15/2023 4:06 PM

## 2023-05-15 NOTE — BHH Counselor (Signed)
 CSW received call from [facility].  Patient remains on the waitlist at this time.  Penni Homans, MSW, LCSW 05/15/2023 1:17 PM

## 2023-05-15 NOTE — Plan of Care (Signed)
  Problem: Health Behavior/Discharge Planning: Goal: Identification of resources available to assist in meeting health care needs will improve Outcome: Not Progressing Goal: Compliance with treatment plan for underlying cause of condition will improve Outcome: Not Progressing

## 2023-05-15 NOTE — Progress Notes (Signed)
 D: Pt alert and oriented. Pt denies experiencing any anxiety/depression at this time. Pt denies experiencing any pain at this time. Pt denies experiencing any SI/HI, or AVH at this time.     A: Scheduled medications administered as prescribed, Hydroxyzine  25 mg po prn offered for notable tremors/anxiety. Support and encouragement provided. Frequent verbal contact made. Routine safety checks conducted q15 minutes.   R: No adverse drug reactions noted. Moderate relief noted at reassessment from offered prn vistaril .  Pt verbally contracts for safety, remains compliant with medications. Pt remains safe at this time. Plan of care ongoing.

## 2023-05-16 DIAGNOSIS — F200 Paranoid schizophrenia: Secondary | ICD-10-CM | POA: Diagnosis not present

## 2023-05-16 NOTE — Progress Notes (Signed)
   05/16/23 1200  Psych Admission Type (Psych Patients Only)  Admission Status Involuntary  Psychosocial Assessment  Patient Complaints None  Eye Contact Brief  Facial Expression Anxious  Affect Irritable  Speech Pressured  Interaction Minimal  Motor Activity Tremors  Appearance/Hygiene Improved  Behavior Characteristics Cooperative  Mood Pleasant  Thought Process  Coherency Disorganized  Content Blaming others  Delusions Paranoid  Perception WDL  Hallucination None reported or observed  Judgment Impaired  Confusion Mild  Danger to Self  Current suicidal ideation? Denies  Danger to Others  Danger to Others None reported or observed

## 2023-05-16 NOTE — Group Note (Signed)
 LCSW Group Therapy Note  Group Date: 05/16/2023 Start Time: 1315 End Time: 1345   Type of Therapy and Topic:  Group Therapy - Healthy vs Unhealthy Coping Skills  Participation Level:  Minimal   Description of Group The focus of this group was to determine what unhealthy coping techniques typically are used by group members and what healthy coping techniques would be helpful in coping with various problems. Patients were guided in becoming aware of the differences between healthy and unhealthy coping techniques. Patients were asked to identify 2-3 healthy coping skills they would like to learn to use more effectively.  Therapeutic Goals Patients learned that coping is what human beings do all day long to deal with various situations in their lives Patients defined and discussed healthy vs unhealthy coping techniques Patients identified their preferred coping techniques and identified whether these were healthy or unhealthy Patients determined 2-3 healthy coping skills they would like to become more familiar with and use more often. Patients provided support and ideas to each other   Summary of Patient Progress: \Patient proved open to input from peers and feedback from CSW. Patient demonstrated fair insight into the subject matter, was respectful of peers, and participated throughout the entire session.   Therapeutic Modalities Cognitive Behavioral Therapy Motivational Interviewing  Lum JONETTA Croft, [place] 05/16/2023  2:50 PM

## 2023-05-16 NOTE — Progress Notes (Signed)
   05/16/23 2100  Psych Admission Type (Psych Patients Only)  Admission Status Involuntary  Psychosocial Assessment  Patient Complaints None  Eye Contact Brief  Facial Expression Anxious;Animated  Affect Anxious  Speech Pressured  Interaction Minimal  Motor Activity Tremors  Appearance/Hygiene Improved  Behavior Characteristics Cooperative  Mood Anxious;Pleasant  Thought Process  Coherency Disorganized  Content Blaming others  Delusions Paranoid  Perception WDL  Hallucination None reported or observed  Judgment Impaired  Confusion Mild  Danger to Self  Current suicidal ideation? Denies  Danger to Others  Danger to Others None reported or observed

## 2023-05-16 NOTE — Group Note (Signed)
 Date:  05/16/2023 Time:  10:35 AM  Group Topic/Focus:  Managing Feelings:   The focus of this group is to identify what feelings patients have difficulty handling and develop a plan to handle them in a healthier way upon discharge.    Participation Level:  Did Not Attend   Camellia HERO Lenka Zhao 05/16/2023, 10:35 AM

## 2023-05-16 NOTE — Group Note (Signed)
 Recreation Therapy Group Note   Group Topic:Problem Solving  Group Date: 05/16/2023 Start Time: 1500 End Time: 1550 Facilitators: Celestia Jeoffrey BRAVO, LRT, CTRS Location:  Dayroom  Group Description: Life Boat. Patients were given the scenario that they are on a boat that is about to become shipwrecked, leaving them stranded on an island. They are asked to make a list of 10 different items that they want to take with them when they are stranded [place] the [place]. Patients are asked to rank their items from most important to least important, #1 being the most important and #10 being the least. Patients will work individually for the first round to come up with 10 items and then pair up with a peer(s) to condense their list and come up with one list of 10 items between the two of them. Patients or LRT will read aloud the 10 different items to the group after each round. LRT facilitated post-activity processing to discuss how this activity can be used in daily life post discharge.   Goal Area(s) Addressed:  Patient will identify priorities, wants and needs. Patient will communicate with LRT and peers. Patient will work collectively as a administrator, civil service. Patient will work on product manager.    Affect/Mood: N/A   Participation Level: Did not attend    Clinical Observations/Individualized Feedback: Patient did not attend group.   Plan: Continue to engage patient in RT group sessions 2-3x/week.   Jeoffrey BRAVO Celestia, LRT, CTRS 05/16/2023 4:57 PM

## 2023-05-16 NOTE — Group Note (Signed)
 Recreation Therapy Group Note   Group Topic:Health and Wellness  Group Date: 05/16/2023 Start Time: 1100 End Time: 1135 Facilitators: Celestia Jeoffrey BRAVO, LRT, CTRS Location:  Dayroom  Group Description: Seated Exercise. LRT discussed the mental and physical benefits of exercise. LRT and group discussed how physical activity can be used as a coping skill. Pt's and LRT followed along to an exercise video on the TV screen that provided a visual representation and audio description of every exercise performed. Pt's encouraged to listen to their bodies and stop at any time if they experience feelings of discomfort or pain. Pts were encouraged to drink water  and stay hydrated.   Goal Area(s) Addressed: Patient will learn benefits of physical activity. Patient will identify exercise as a coping skill.  Patient will follow multistep directions. Patient will try a new leisure interest.    Affect/Mood: N/A   Participation Level: Did not attend    Clinical Observations/Individualized Feedback: Antwann was present in the dayroom, however, pt was asleep in the corner of the room. Pt did not wake to the sound of LRT, peers, or the TV being on.   Plan: Continue to engage patient in RT group sessions 2-3x/week.   Jeoffrey BRAVO Celestia, LRT, CTRS 05/16/2023 12:45 PM

## 2023-05-16 NOTE — Progress Notes (Signed)
 [facility] MD Progress Note  05/16/2023  Mark [name]  MRN:  [id number]  Mark [name] is a 63 year old African-American male who is involuntarily admitted to psychiatry for aggressive behavior towards his parents. He lives with mom and dad in [place]. He has a long history of schizophrenia.   Subjective: Case discussed in multidisciplinary meeting, chart reviewed, patient seen today.  Per staff report patient is doing fine on the unit.  Today during the assessment the patient endorses good mood.  Patient denies auditory or visual hallucinations.  Patient denies thoughts of harming himself or others.  He is more visible on the unit and has been attending groups.  No new acute events overnight.  Principal Problem: Paranoid schizophrenia (HCC) Diagnosis: Principal Problem:   Paranoid schizophrenia (HCC)   Past Psychiatric History: He has a long history of schizophrenia.  Past admission in February 2023, patient was discharged on Depakote  500 mg twice daily, Cogentin  1 mg twice daily, Haldol  5 mg twice daily and Neurontin  400 mg 3 times daily also clozapine  was discontinued during that hospitalization,Past Psychotropic medications included clozapine , Depakote , Haldol , Cogentin , Inderal , trazodone  and Vistaril .    Past Medical History:  Past Medical History:  Diagnosis Date   COPD (chronic obstructive pulmonary disease) (HCC)    Hypertension    Schizophrenia (HCC)     Past Surgical History:  Procedure Laterality Date   LOWER EXTREMITY ANGIOGRAPHY N/A 09/18/2017   Procedure: LOWER EXTREMITY ANGIOGRAPHY- Recheck lysis;  Surgeon: Sheree Penne Bruckner, MD;  Location: [facility] INVASIVE CV LAB;  Service: Cardiovascular;  Laterality: N/A;   PERIPHERAL VASCULAR INTERVENTION Left 09/18/2017   Procedure: PERIPHERAL VASCULAR INTERVENTION;  Surgeon: Sheree Penne Bruckner, MD;  Location: [facility] INVASIVE CV LAB;  Service: Cardiovascular;  Laterality: Left;   THROMBECTOMY FEMORAL ARTERY Left 09/17/2017    Procedure: POSSIBLE THROMBECTOMY;  Surgeon: Sheree Penne Bruckner, MD;  Location: [facility] OR;  Service: Vascular;  Laterality: Left;   VENOGRAM Left 09/17/2017   Procedure: ULTRASOUND POPLITEAL ACCESS; CENTRAL VENOGRAM, IVVS, LYSIS CATHETER PLACEMENT;  Surgeon: Sheree Penne Bruckner, MD;  Location: [facility] OR;  Service: Vascular;  Laterality: Left;    Social History:  Social History   Substance and Sexual Activity  Alcohol Use No     Social History   Substance and Sexual Activity  Drug Use Never    Social History   Socioeconomic History   Marital status: Single    Spouse name: Not on file   Number of children: Not on file   Years of education: Not on file   Highest education level: Not on file  Occupational History   Not on file  Tobacco Use   Smoking status: Every Day    Current packs/day: 0.50    Types: Cigarettes   Smokeless tobacco: Never  Vaping Use   Vaping status: Never Used  Substance and Sexual Activity   Alcohol use: No   Drug use: Never   Sexual activity: Not Currently  Other Topics Concern   Not on file  Social History Narrative   Not on file   Social Drivers of Health   Financial Resource Strain: Not on file  Food Insecurity: No Food Insecurity (04/27/2023)   Hunger Vital Sign    Worried About Running Out of Food in the Last Year: Never true    Ran Out of Food in the Last Year: Never true  Transportation Needs: No Transportation Needs (04/27/2023)   PRAPARE - Transportation    Lack of Transportation (Medical): No    Lack of  Transportation (Non-Medical): No  Physical Activity: Not on file  Stress: Not on file  Social Connections: Not on file                           Sleep: Fair  Appetite:  Fair  Current Medications: Current Facility-Administered Medications  Medication Dose Route Frequency Provider Last Rate Last Admin   acetaminophen  (TYLENOL ) tablet 650 mg  650 mg Oral Q6H PRN Motley-Mangrum, [name], PMHNP       alum & mag  hydroxide-simeth (MAALOX/MYLANTA) 200-200-20 MG/5ML suspension 30 mL  30 mL Oral Q4H PRN Motley-Mangrum, [name], PMHNP   30 mL at 05/15/23 1351   amantadine  (SYMMETREL ) capsule 100 mg  100 mg Oral BID Victoria Ruts, MD   100 mg at 05/16/23 0926   amLODipine  (NORVASC ) tablet 5 mg  5 mg Oral Daily Rashika Bettes, MD   5 mg at 05/16/23 0926   aspirin  EC tablet 81 mg  81 mg Oral Daily Raeanne Deschler, MD   81 mg at 05/16/23 9073   haloperidol  (HALDOL ) tablet 5 mg  5 mg Oral TID PRN Motley-Mangrum, [name], PMHNP   5 mg at 05/09/23 9166   And   diphenhydrAMINE  (BENADRYL ) capsule 50 mg  50 mg Oral TID PRN Motley-Mangrum, [name], PMHNP   50 mg at 05/09/23 9165   haloperidol  lactate (HALDOL ) injection 10 mg  10 mg Intramuscular TID PRN Motley-Mangrum, [name], PMHNP   10 mg at 05/08/23 1127   And   diphenhydrAMINE  (BENADRYL ) injection 50 mg  50 mg Intramuscular TID PRN Motley-Mangrum, [name], PMHNP       And   LORazepam  (ATIVAN ) injection 2 mg  2 mg Intramuscular TID PRN Motley-Mangrum, [name], PMHNP       haloperidol  lactate (HALDOL ) injection 5 mg  5 mg Intramuscular TID PRN Motley-Mangrum, [name], PMHNP       And   diphenhydrAMINE  (BENADRYL ) injection 50 mg  50 mg Intramuscular TID PRN Motley-Mangrum, [name], PMHNP       And   LORazepam  (ATIVAN ) injection 2 mg  2 mg Intramuscular TID PRN Motley-Mangrum, [name], PMHNP       divalproex  (DEPAKOTE  ER) 24 hr tablet 500 mg  500 mg Oral BID Motley-Mangrum, [name], PMHNP   500 mg at 05/16/23 0926   gabapentin  (NEURONTIN ) capsule 400 mg  400 mg Oral BID Motley-Mangrum, [name], PMHNP   400 mg at 05/16/23 0926   hydrOXYzine  (ATARAX ) tablet 25 mg  25 mg Oral TID PRN Motley-Mangrum, [name], PMHNP   25 mg at 05/16/23 0926   LORazepam  (ATIVAN ) tablet 1 mg  1 mg Oral Q8H PRN Motley-Mangrum, [name], PMHNP   1 mg at 05/09/23 9166   magnesium  hydroxide (MILK OF MAGNESIA) suspension 30 mL  30 mL Oral Daily PRN Motley-Mangrum, [name],  PMHNP       OLANZapine  (ZYPREXA ) tablet 15 mg  15 mg Oral QHS Ezella Kell, MD   15 mg at 05/15/23 2105   traZODone  (DESYREL ) tablet 100 mg  100 mg Oral QHS PRN Motley-Mangrum, [name], PMHNP   100 mg at 05/15/23 2143   valbenazine  ([name] ) capsule 40 mg  40 mg Oral Daily Cam Charlie Loving, DO   40 mg at 05/16/23 9073    Lab Results: No results found for this or any previous visit (from the past 48 hours).  Blood Alcohol level:  Lab Results  Component Value Date  ETH <10 04/25/2023   ETH <10 12/31/2022    Metabolic Disorder Labs: Lab Results  Component Value Date   HGBA1C 5.5 12/31/2022   MPG 111.15 12/31/2022   MPG 134.11 12/29/2021   No results found for: PROLACTIN Lab Results  Component Value Date   CHOL 133 12/31/2022   TRIG 39 12/31/2022   HDL 54 12/31/2022   CHOLHDL 2.5 12/31/2022   VLDL 8 12/31/2022   LDLCALC 71 12/31/2022   LDLCALC 69 12/01/2022     Musculoskeletal: Strength & Muscle Tone: within normal limits Gait & Station: normal Patient leans: N/A  Psychiatric Specialty Exam:  Presentation  General Appearance:  Appropriate for Environment  Eye Contact: Good  Speech: Spontaneous  Speech Volume: Normal  Handedness: Right   Mood and Affect  Mood: Good  Affect: Stable and calm   Thought Process  Thought Processes: Goal-directed, focused on going home  Descriptions of Associations: Intact  Orientation:Full (Time, Place and Person)  Thought Content: Improving  History of Schizophrenia/Schizoaffective disorder:Yes  Duration of Psychotic Symptoms:Greater than six months  Hallucinations: Denies  ideas of Reference:None  Suicidal Thoughts: Denies  Homicidal Thoughts: Denies  Sensorium  Memory: Fair  Judgment: Improving  Insight: Improving   Executive Functions  Concentration: Fair  Attention Span: Fair   Language: Fair   Psychomotor Activity  Psychomotor Activity: Normal, has tremors  in both upper extremities  Assets  Assets: Communication Skills; Desire for Improvement   Sleep  Sleep: Fair  Physical Exam: Physical Exam Constitutional:      Appearance: Normal appearance.  HENT:     Head: Normocephalic and atraumatic.     Nose: No congestion.  Eyes:     Pupils: Pupils are equal, round, and reactive to light.  Cardiovascular:     Rate and Rhythm: Regular rhythm.  Pulmonary:     Effort: Pulmonary effort is normal.  Skin:    General: Skin is warm.  Neurological:     General: No focal deficit present.     Mental Status: He is alert and oriented to person, place, and time.    Review of Systems  Constitutional:  Negative for chills and fever.  HENT:  Negative for hearing loss and tinnitus.   Eyes:  Negative for blurred vision.  Respiratory:  Negative for cough and shortness of breath.   Cardiovascular:  Negative for chest pain and palpitations.  Gastrointestinal:  Negative for nausea and vomiting.   Blood pressure 132/68, pulse 61, temperature (!) 97.5 F (36.4 C), resp. rate 16, height 5' 11 (1.803 m), weight 71.2 kg, SpO2 100%. Body mass index is 21.9 kg/m.   Treatment Plan Summary: Daily contact with patient to assess and evaluate symptoms and progress in treatment and Medication management  Continue on olanzapine  15 mg at bedtime,  Depakote  500 mg po BID Will continue on [name]  40 mg p.o. daily Continue Norvasc  5 mg by mouth daily  Valproic acid  level, ALT/AST tomorrow AM  Wenceslao Harries, MD

## 2023-05-16 NOTE — Progress Notes (Signed)
   05/16/23 0600  Thought Process  Perception Hallucinations  Hallucination Auditory

## 2023-05-16 NOTE — Group Note (Signed)
 Date:  05/16/2023 Time:  1:37 AM  Group Topic/Focus:  Goals Group:   The focus of this group is to help patients establish daily goals to achieve during treatment and discuss how the patient can incorporate goal setting into their daily lives to aide in recovery. Wrap-Up Group:   The focus of this group is to help patients review their daily goal of treatment and discuss progress on daily workbooks.    Participation Level:  Did Not Attend   Additional Comments:    Kristen VEAR Gibbon 05/16/2023, 1:37 AM

## 2023-05-16 NOTE — Progress Notes (Signed)
   05/16/23 0600  15 Minute Checks  Location Dayroom  Visual Appearance Calm  Behavior Composed  Sleep (Behavioral Health Patients Only)  Calculate sleep? (Click Yes once per 24 hr at 0600 safety check) Yes  Documented sleep last 24 hours 8.5

## 2023-05-16 NOTE — Plan of Care (Signed)
   Problem: Physical Regulation: Goal: Ability to maintain clinical measurements within normal limits will improve Outcome: Progressing   Problem: Safety: Goal: Periods of time without injury will increase Outcome: Progressing

## 2023-05-17 DIAGNOSIS — F200 Paranoid schizophrenia: Secondary | ICD-10-CM | POA: Diagnosis not present

## 2023-05-17 LAB — VALPROIC ACID LEVEL: Valproic Acid Lvl: 60 ug/mL (ref 50.0–100.0)

## 2023-05-17 LAB — ALT: ALT: 10 U/L (ref 0–44)

## 2023-05-17 LAB — AST: AST: 16 U/L (ref 15–41)

## 2023-05-17 NOTE — Plan of Care (Signed)
  Problem: Education: Goal: Knowledge of [facility] Education information/materials will improve Outcome: Progressing Goal: Emotional status will improve Outcome: Progressing Goal: Mental status will improve Outcome: Progressing Goal: Verbalization of understanding the information provided will improve Outcome: Progressing   Problem: Education: Goal: Emotional status will improve Outcome: Progressing Goal: Mental status will improve Outcome: Progressing   Problem: Coping: Goal: Ability to verbalize frustrations and anger appropriately will improve Outcome: Progressing Goal: Ability to demonstrate self-control will improve Outcome: Progressing   Problem: Health Behavior/Discharge Planning: Goal: Identification of resources available to assist in meeting health care needs will improve Outcome: Progressing Goal: Compliance with treatment plan for underlying cause of condition will improve Outcome: Progressing   Problem: Physical Regulation: Goal: Ability to maintain clinical measurements within normal limits will improve Outcome: Progressing   Problem: Safety: Goal: Periods of time without injury will increase Outcome: Progressing

## 2023-05-17 NOTE — Progress Notes (Signed)
 [facility] MD Progress Note  05/17/2023  Mark [name]  MRN:  [id number]  Mark [name] is a 63 year old African-American male who is involuntarily admitted to psychiatry for aggressive behavior towards his parents. He lives with mom and dad in [place]. He has a long history of schizophrenia.   Subjective: Case discussed in multidisciplinary meeting, chart reviewed, patient seen today.  Per staff report patient is doing fine on the unit.  SW shared that care coordinator through [employer] company probably will help with housing. Today during the assessment the patient endorses fine mood.  Patient denies auditory or visual hallucinations.  Patient denies thoughts of harming himself or others.  He is more visible on the unit and has been attending groups.  No new acute events overnight.  Principal Problem: Paranoid schizophrenia (HCC) Diagnosis: Principal Problem:   Paranoid schizophrenia (HCC)   Past Psychiatric History: He has a long history of schizophrenia.  Past admission in February 2023, patient was discharged on Depakote  500 mg twice daily, Cogentin  1 mg twice daily, Haldol  5 mg twice daily and Neurontin  400 mg 3 times daily also clozapine  was discontinued during that hospitalization,Past Psychotropic medications included clozapine , Depakote , Haldol , Cogentin , Inderal , trazodone  and Vistaril .    Past Medical History:  Past Medical History:  Diagnosis Date   COPD (chronic obstructive pulmonary disease) (HCC)    Hypertension    Schizophrenia (HCC)     Past Surgical History:  Procedure Laterality Date   LOWER EXTREMITY ANGIOGRAPHY N/A 09/18/2017   Procedure: LOWER EXTREMITY ANGIOGRAPHY- Recheck lysis;  Surgeon: Sheree Penne Bruckner, MD;  Location: [facility] INVASIVE CV LAB;  Service: Cardiovascular;  Laterality: N/A;   PERIPHERAL VASCULAR INTERVENTION Left 09/18/2017   Procedure: PERIPHERAL VASCULAR INTERVENTION;  Surgeon: Sheree Penne Bruckner, MD;  Location: [facility] INVASIVE CV LAB;   Service: Cardiovascular;  Laterality: Left;   THROMBECTOMY FEMORAL ARTERY Left 09/17/2017   Procedure: POSSIBLE THROMBECTOMY;  Surgeon: Sheree Penne Bruckner, MD;  Location: [facility] OR;  Service: Vascular;  Laterality: Left;   VENOGRAM Left 09/17/2017   Procedure: ULTRASOUND POPLITEAL ACCESS; CENTRAL VENOGRAM, IVVS, LYSIS CATHETER PLACEMENT;  Surgeon: Sheree Penne Bruckner, MD;  Location: [facility] OR;  Service: Vascular;  Laterality: Left;    Social History:  Social History   Substance and Sexual Activity  Alcohol Use No     Social History   Substance and Sexual Activity  Drug Use Never    Social History   Socioeconomic History   Marital status: Single    Spouse name: Not on file   Number of children: Not on file   Years of education: Not on file   Highest education level: Not on file  Occupational History   Not on file  Tobacco Use   Smoking status: Every Day    Current packs/day: 0.50    Types: Cigarettes   Smokeless tobacco: Never  Vaping Use   Vaping status: Never Used  Substance and Sexual Activity   Alcohol use: No   Drug use: Never   Sexual activity: Not Currently  Other Topics Concern   Not on file  Social History Narrative   Not on file   Social Drivers of Health   Financial Resource Strain: Not on file  Food Insecurity: No Food Insecurity (04/27/2023)   Hunger Vital Sign    Worried About Running Out of Food in the Last Year: Never true    Ran Out of Food in the Last Year: Never true  Transportation Needs: No Transportation Needs (04/27/2023)   PRAPARE -  Administrator, Civil Service (Medical): No    Lack of Transportation (Non-Medical): No  Physical Activity: Not on file  Stress: Not on file  Social Connections: Not on file                           Sleep: Fair  Appetite:  Fair  Current Medications: Current Facility-Administered Medications  Medication Dose Route Frequency Provider Last Rate Last Admin   acetaminophen   (TYLENOL ) tablet 650 mg  650 mg Oral Q6H PRN Motley-Mangrum, [name], PMHNP       alum & mag hydroxide-simeth (MAALOX/MYLANTA) 200-200-20 MG/5ML suspension 30 mL  30 mL Oral Q4H PRN Motley-Mangrum, [name], PMHNP   30 mL at 05/15/23 1351   amantadine  (SYMMETREL ) capsule 100 mg  100 mg Oral BID Victoria Ruts, MD   100 mg at 05/17/23 9147   amLODipine  (NORVASC ) tablet 5 mg  5 mg Oral Daily Vinia Jemmott, MD   5 mg at 05/17/23 0849   aspirin  EC tablet 81 mg  81 mg Oral Daily Zamyah Wiesman, MD   81 mg at 05/17/23 0849   haloperidol  (HALDOL ) tablet 5 mg  5 mg Oral TID PRN Motley-Mangrum, [name], PMHNP   5 mg at 05/09/23 9166   And   diphenhydrAMINE  (BENADRYL ) capsule 50 mg  50 mg Oral TID PRN Motley-Mangrum, [name], PMHNP   50 mg at 05/09/23 0834   haloperidol  lactate (HALDOL ) injection 10 mg  10 mg Intramuscular TID PRN Motley-Mangrum, [name], PMHNP   10 mg at 05/08/23 1127   And   diphenhydrAMINE  (BENADRYL ) injection 50 mg  50 mg Intramuscular TID PRN Motley-Mangrum, [name], PMHNP       And   LORazepam  (ATIVAN ) injection 2 mg  2 mg Intramuscular TID PRN Motley-Mangrum, [name], PMHNP       haloperidol  lactate (HALDOL ) injection 5 mg  5 mg Intramuscular TID PRN Motley-Mangrum, [name], PMHNP       And   diphenhydrAMINE  (BENADRYL ) injection 50 mg  50 mg Intramuscular TID PRN Motley-Mangrum, [name], PMHNP       And   LORazepam  (ATIVAN ) injection 2 mg  2 mg Intramuscular TID PRN Motley-Mangrum, [name], PMHNP       divalproex  (DEPAKOTE  ER) 24 hr tablet 500 mg  500 mg Oral BID Motley-Mangrum, [name], PMHNP   500 mg at 05/17/23 1628   gabapentin  (NEURONTIN ) capsule 400 mg  400 mg Oral BID Motley-Mangrum, [name], PMHNP   400 mg at 05/17/23 0848   hydrOXYzine  (ATARAX ) tablet 25 mg  25 mg Oral TID PRN Motley-Mangrum, [name], PMHNP   25 mg at 05/17/23 0848   LORazepam  (ATIVAN ) tablet 1 mg  1 mg Oral Q8H PRN Motley-Mangrum, [name], PMHNP   1 mg at 05/09/23 9166   magnesium   hydroxide (MILK OF MAGNESIA) suspension 30 mL  30 mL Oral Daily PRN Motley-Mangrum, [name], PMHNP       OLANZapine  (ZYPREXA ) tablet 15 mg  15 mg Oral QHS Dalyce Renne, MD   15 mg at 05/16/23 2115   traZODone  (DESYREL ) tablet 100 mg  100 mg Oral QHS PRN Motley-Mangrum, [name], PMHNP   100 mg at 05/16/23 2115   valbenazine  ([name] ) capsule 40 mg  40 mg Oral Daily Cam Charlie Loving, DO   40 mg at 05/17/23 9151    Lab Results:  Results for orders placed or performed during the hospital encounter  of 04/27/23 (from the past 48 hours)  Valproic acid  level     Status: None   Collection Time: 05/17/23  6:30 AM  Result Value Ref Range   Valproic Acid  Lvl 60 50.0 - 100.0 ug/mL    Comment: Performed at [facility], [address] [place], [place] [address]  ALT     Status: None   Collection Time: 05/17/23  6:30 AM  Result Value Ref Range   ALT 10 0 - 44 U/L    Comment: Performed at [facility], [address] [place], [place] [address]  AST     Status: None   Collection Time: 05/17/23  6:30 AM  Result Value Ref Range   AST 16 15 - 41 U/L    Comment: Performed at [facility], [address] [place], [place] [address]    Blood Alcohol level:  Lab Results  Component Value Date   [facility] <10 04/25/2023   ETH <10 12/31/2022    Metabolic Disorder Labs: Lab Results  Component Value Date   HGBA1C 5.5 12/31/2022   MPG 111.15 12/31/2022   MPG 134.11 12/29/2021   No results found for: PROLACTIN Lab Results  Component Value Date   CHOL 133 12/31/2022   TRIG 39 12/31/2022   HDL 54 12/31/2022   CHOLHDL 2.5 12/31/2022   VLDL 8 12/31/2022   LDLCALC 71 12/31/2022   LDLCALC 69 12/01/2022     Musculoskeletal: Strength & Muscle Tone: within normal limits Gait & Station: normal Patient leans: N/A  Psychiatric Specialty Exam:  Presentation  General Appearance:  Appropriate for Environment  Eye  Contact: Good  Speech: Spontaneous  Speech Volume: Normal  Handedness: Right   Mood and Affect  Mood: Fine  Affect: Stable and calm   Thought Process  Thought Processes: Goal-directed, focused on going home  Descriptions of Associations: Intact  Orientation:Full (Time, Place and Person)  Thought Content: Improving  History of Schizophrenia/Schizoaffective disorder:Yes  Duration of Psychotic Symptoms:Greater than six months  Hallucinations: Denies  ideas of Reference:None  Suicidal Thoughts: Denies  Homicidal Thoughts: Denies  Sensorium  Memory: Fair  Judgment: Improving  Insight: Improving   Executive Functions  Concentration: Fair  Attention Span: Fair   Language: Fair   Psychomotor Activity  Psychomotor Activity: Normal, has tremors in both upper extremities  Assets  Assets: Communication Skills; Desire for Improvement   Sleep  Sleep: Fair  Physical Exam: Physical Exam Constitutional:      Appearance: Normal appearance.  HENT:     Head: Normocephalic and atraumatic.     Nose: No congestion.  Eyes:     Pupils: Pupils are equal, round, and reactive to light.  Cardiovascular:     Rate and Rhythm: Regular rhythm.  Pulmonary:     Effort: Pulmonary effort is normal.  Skin:    General: Skin is warm.  Neurological:     General: No focal deficit present.     Mental Status: He is alert and oriented to person, place, and time.    Review of Systems  Constitutional:  Negative for chills and fever.  HENT:  Negative for hearing loss and tinnitus.   Eyes:  Negative for blurred vision.  Respiratory:  Negative for cough and shortness of breath.   Cardiovascular:  Negative for chest pain and palpitations.  Gastrointestinal:  Negative for nausea and vomiting.   Blood pressure 129/71, pulse 74, temperature 98.2 F (36.8 C), resp. rate 18, height 5' 11 (1.803 m), weight 71.2 kg,  SpO2 99%. Body mass index is 21.9  kg/m.   Treatment Plan Summary: Daily contact with patient to assess and evaluate symptoms and progress in treatment and Medication management  Continue on olanzapine  15 mg at bedtime,  Depakote  500 mg po BID Will continue on [name]  40 mg p.o. daily Continue Norvasc  5 mg by mouth daily  Valproic acid  level, ALT/AST tomorrow AM  Wenceslao Harries, MD

## 2023-05-17 NOTE — Progress Notes (Signed)
 Patient is alert and oriented times 2-3. Mood and affect anxious. Patient denies pain. He denies SI, HI, and AVH. Also denies feelings of anxiety and depression at this time. States he slept good last night. Morning meds given whole by mouth W/O difficulty. Ate breakfast in day room- appetite good. Patient remains on unit with Q15 minute checks in place.

## 2023-05-17 NOTE — Group Note (Signed)
 Date:  05/17/2023 Time:  9:18 PM  Group Topic/Focus:  Wellness Toolbox:   The focus of this group is to discuss various aspects of wellness, balancing those aspects and exploring ways to increase the ability to experience wellness.  Patients will create a wellness toolbox for use upon discharge.    Participation Level:  Did Not Attend  Participation Quality:   none  Affect:   none  Cognitive:   none  Insight: None  Engagement in Group:   none  Modes of Intervention:   none  Additional Comments:  none   Kerri Katz 05/17/2023, 9:18 PM

## 2023-05-17 NOTE — Progress Notes (Signed)
   05/17/23 1016  Psych Admission Type (Psych Patients Only)  Admission Status Involuntary  Psychosocial Assessment  Patient Complaints None  Eye Contact Brief  Facial Expression Animated;Anxious  Affect Anxious  Speech Loud;Slurred  Interaction Minimal  Motor Activity Tremors;Pacing  Appearance/Hygiene Bizarre  Behavior Characteristics Cooperative  Mood Anxious  Thought Process  Coherency Disorganized  Content Preoccupation  Delusions None reported or observed  Perception WDL  Hallucination None reported or observed  Judgment Impaired  Confusion Mild  Danger to Self  Current suicidal ideation? Denies  Danger to Others  Danger to Others None reported or observed

## 2023-05-17 NOTE — Group Note (Signed)
 Date:  05/17/2023 Time:  7:11 AM  Group Topic/Focus:  Wrap-Up Group:   The focus of this group is to help patients review their daily goal of treatment and discuss progress on daily workbooks.    Participation Level:  Did Not Attend  Participation Quality:      Affect:      Cognitive:      Insight: None  Engagement in Group:  None  Modes of Intervention:      Additional Comments:    Tommas CHRISTELLA Bunker 05/17/2023, 7:11 AM

## 2023-05-17 NOTE — Progress Notes (Signed)
   05/17/23 2200  Psych Admission Type (Psych Patients Only)  Admission Status Involuntary  Psychosocial Assessment  Patient Complaints Irritability  Eye Contact Brief  Facial Expression Anxious  Affect Irritable  Speech Pressured  Interaction Minimal  Motor Activity Fidgety;Tremors  Appearance/Hygiene Improved  Behavior Characteristics Cooperative  Mood Irritable  Thought Process  Coherency Disorganized  Content Blaming others;Preoccupation  Delusions None reported or observed  Perception WDL  Hallucination None reported or observed  Judgment Impaired  Confusion Mild  Danger to Self  Current suicidal ideation? Denies  Danger to Others  Danger to Others None reported or observed

## 2023-05-17 NOTE — Group Note (Signed)
 Date:  05/17/2023 Time:  10:30 AM  Group Topic/Focus:  Goals Group:   The focus of this group is to help patients establish daily goals to achieve during treatment and discuss how the patient can incorporate goal setting into their daily lives to aide in recovery.    Participation Level:  Did Not Attend    Mark [name] Bias 05/17/2023, 10:30 AM

## 2023-05-17 NOTE — Progress Notes (Signed)
   05/17/23 0554  15 Minute Checks  Location Bedroom  Visual Appearance Calm  Behavior Composed;Sleeping  Sleep (Behavioral Health Patients Only)  Calculate sleep? (Click Yes once per 24 hr at 0600 safety check) Yes  Documented sleep last 24 hours 6.25

## 2023-05-17 NOTE — Progress Notes (Signed)
   05/17/23 0653  15 Minute Checks  Location Dayroom  Visual Appearance Calm  Behavior Composed  Sleep (Behavioral Health Patients Only)  Calculate sleep? (Click Yes once per 24 hr at 0600 safety check) Yes  Documented sleep last 24 hours 6.25

## 2023-05-18 DIAGNOSIS — F200 Paranoid schizophrenia: Secondary | ICD-10-CM | POA: Diagnosis not present

## 2023-05-18 NOTE — Plan of Care (Signed)
  Problem: Education: Goal: Knowledge of [facility] Education information/materials will improve Outcome: Progressing Goal: Emotional status will improve Outcome: Progressing Goal: Mental status will improve Outcome: Progressing Goal: Verbalization of understanding the information provided will improve Outcome: Progressing   Problem: Education: Goal: Emotional status will improve Outcome: Progressing Goal: Mental status will improve Outcome: Progressing   Problem: Coping: Goal: Ability to verbalize frustrations and anger appropriately will improve Outcome: Progressing Goal: Ability to demonstrate self-control will improve Outcome: Progressing   Problem: Health Behavior/Discharge Planning: Goal: Identification of resources available to assist in meeting health care needs will improve Outcome: Progressing Goal: Compliance with treatment plan for underlying cause of condition will improve Outcome: Progressing   Problem: Physical Regulation: Goal: Ability to maintain clinical measurements within normal limits will improve Outcome: Progressing   Problem: Safety: Goal: Periods of time without injury will increase Outcome: Progressing

## 2023-05-18 NOTE — BH IP Treatment Plan (Signed)
 Interdisciplinary Treatment and Diagnostic Plan Update  05/18/2023 Time of Session: 9:30 AM  Mark [name] MRN: [id number]  Principal Diagnosis: Paranoid schizophrenia (HCC)  Secondary Diagnoses: Principal Problem:   Paranoid schizophrenia (HCC)   Current Medications:  Current Facility-Administered Medications  Medication Dose Route Frequency Provider Last Rate Last Admin   acetaminophen  (TYLENOL ) tablet 650 mg  650 mg Oral Q6H PRN Motley-Mangrum, [name], PMHNP       alum & mag hydroxide-simeth (MAALOX/MYLANTA) 200-200-20 MG/5ML suspension 30 mL  30 mL Oral Q4H PRN Motley-Mangrum, [name], PMHNP   30 mL at 05/15/23 1351   amantadine  (SYMMETREL ) capsule 100 mg  100 mg Oral BID Victoria Ruts, MD   100 mg at 05/18/23 0844   amLODipine  (NORVASC ) tablet 5 mg  5 mg Oral Daily Parmar, Meenakshi, MD   5 mg at 05/18/23 0844   aspirin  EC tablet 81 mg  81 mg Oral Daily Parmar, Meenakshi, MD   81 mg at 05/18/23 0844   haloperidol  (HALDOL ) tablet 5 mg  5 mg Oral TID PRN Motley-Mangrum, [name], PMHNP   5 mg at 05/09/23 9166   And   diphenhydrAMINE  (BENADRYL ) capsule 50 mg  50 mg Oral TID PRN Motley-Mangrum, [name], PMHNP   50 mg at 05/09/23 9165   haloperidol  lactate (HALDOL ) injection 10 mg  10 mg Intramuscular TID PRN Motley-Mangrum, [name], PMHNP   10 mg at 05/08/23 1127   And   diphenhydrAMINE  (BENADRYL ) injection 50 mg  50 mg Intramuscular TID PRN Motley-Mangrum, [name], PMHNP       And   LORazepam  (ATIVAN ) injection 2 mg  2 mg Intramuscular TID PRN Motley-Mangrum, [name], PMHNP       haloperidol  lactate (HALDOL ) injection 5 mg  5 mg Intramuscular TID PRN Motley-Mangrum, [name], PMHNP       And   diphenhydrAMINE  (BENADRYL ) injection 50 mg  50 mg Intramuscular TID PRN Motley-Mangrum, [name], PMHNP       And   LORazepam  (ATIVAN ) injection 2 mg  2 mg Intramuscular TID PRN Motley-Mangrum, [name], PMHNP       divalproex  (DEPAKOTE  ER) 24 hr tablet 500 mg  500 mg Oral BID  Motley-Mangrum, [name], PMHNP   500 mg at 05/18/23 0844   gabapentin  (NEURONTIN ) capsule 400 mg  400 mg Oral BID Motley-Mangrum, [name], PMHNP   400 mg at 05/18/23 0844   hydrOXYzine  (ATARAX ) tablet 25 mg  25 mg Oral TID PRN Motley-Mangrum, [name], PMHNP   25 mg at 05/17/23 2057   LORazepam  (ATIVAN ) tablet 1 mg  1 mg Oral Q8H PRN Motley-Mangrum, [name], PMHNP   1 mg at 05/09/23 9166   magnesium  hydroxide (MILK OF MAGNESIA) suspension 30 mL  30 mL Oral Daily PRN Motley-Mangrum, [name], PMHNP       OLANZapine  (ZYPREXA ) tablet 15 mg  15 mg Oral QHS Parmar, Meenakshi, MD   15 mg at 05/17/23 2057   traZODone  (DESYREL ) tablet 100 mg  100 mg Oral QHS PRN Motley-Mangrum, [name], PMHNP   100 mg at 05/17/23 2057   valbenazine  ([name] ) capsule 40 mg  40 mg Oral Daily Mark Charlie Loving, DO   40 mg at 05/18/23 0844   PTA Medications: Medications Prior to Admission  Medication Sig Dispense Refill Last Dose/Taking   amantadine  (SYMMETREL ) 100 MG capsule Take 100 mg by mouth 2 (two) times daily.      amLODipine  (NORVASC ) 5 MG tablet Take 1 tablet (5 mg  total) by mouth daily.      aspirin  EC 81 MG tablet Take 81 mg by mouth daily. Swallow whole.      benztropine  (COGENTIN ) 1 MG tablet Take 1 tablet (1 mg total) by mouth 2 (two) times daily. 60 tablet 0    cyclobenzaprine  (FLEXERIL ) 10 MG tablet Take 10 mg by mouth 2 (two) times daily as needed.      divalproex  (DEPAKOTE  ER) 500 MG 24 hr tablet Take 1 tablet (500 mg total) by mouth 2 (two) times daily. (Patient not taking: Reported on 04/25/2023) 60 tablet 0    gabapentin  (NEURONTIN ) 300 MG capsule Take 300 mg by mouth 3 (three) times daily. (Patient not taking: Reported on 04/25/2023)      gabapentin  (NEURONTIN ) 400 MG capsule Take 1 capsule (400 mg total) by mouth 3 (three) times daily. (Patient not taking: Reported on 04/25/2023) 90 capsule 0    haloperidol  (HALDOL ) 10 MG tablet Take 1 tablet (10 mg total) by mouth 2 (two) times daily.  (Patient not taking: Reported on 04/25/2023)      traZODone  (DESYREL ) 150 MG tablet Take 1 tablet (150 mg total) by mouth at bedtime. (Patient not taking: Reported on 04/25/2023) 30 tablet 0     Patient Stressors: Marital or family conflict   Medication change or noncompliance    Patient Strengths: Supportive family/friends   Treatment Modalities: Medication Management, Group therapy, Case management,  1 to 1 session with clinician, Psychoeducation, Recreational therapy.   Physician Treatment Plan for Primary Diagnosis: Paranoid schizophrenia (HCC) Long Term Goal(s): Improvement in symptoms so as ready for discharge   Short Term Goals: Ability to identify changes in lifestyle to reduce recurrence of condition will improve Ability to verbalize feelings will improve Ability to disclose and discuss suicidal ideas Ability to demonstrate self-control will improve Ability to identify and develop effective coping behaviors will improve Ability to maintain clinical measurements within normal limits will improve Compliance with prescribed medications will improve Ability to identify triggers associated with substance abuse/mental health issues will improve  Medication Management: Evaluate patient's response, side effects, and tolerance of medication regimen.  Therapeutic Interventions: 1 to 1 sessions, Unit Group sessions and Medication administration.  Evaluation of Outcomes: Progressing  Physician Treatment Plan for Secondary Diagnosis: Principal Problem:   Paranoid schizophrenia (HCC)  Long Term Goal(s): Improvement in symptoms so as ready for discharge   Short Term Goals: Ability to identify changes in lifestyle to reduce recurrence of condition will improve Ability to verbalize feelings will improve Ability to disclose and discuss suicidal ideas Ability to demonstrate self-control will improve Ability to identify and develop effective coping behaviors will improve Ability to  maintain clinical measurements within normal limits will improve Compliance with prescribed medications will improve Ability to identify triggers associated with substance abuse/mental health issues will improve     Medication Management: Evaluate patient's response, side effects, and tolerance of medication regimen.  Therapeutic Interventions: 1 to 1 sessions, Unit Group sessions and Medication administration.  Evaluation of Outcomes: Progressing   RN Treatment Plan for Primary Diagnosis: Paranoid schizophrenia (HCC) Long Term Goal(s): Knowledge of disease and therapeutic regimen to maintain health will improve  Short Term Goals: Ability to remain free from injury will improve, Ability to verbalize frustration and anger appropriately will improve, Ability to demonstrate self-control, Ability to participate in decision making will improve, Ability to verbalize feelings will improve, Ability to disclose and discuss suicidal ideas, Ability to identify and develop effective coping behaviors will improve, and Compliance  with prescribed medications will improve  Medication Management: RN will administer medications as ordered by provider, will assess and evaluate patient's response and provide education to patient for prescribed medication. RN will report any adverse and/or side effects to prescribing provider.  Therapeutic Interventions: 1 on 1 counseling sessions, Psychoeducation, Medication administration, Evaluate responses to treatment, Monitor vital signs and CBGs as ordered, Perform/monitor CIWA, COWS, AIMS and Fall Risk screenings as ordered, Perform wound care treatments as ordered.  Evaluation of Outcomes: Progressing   LCSW Treatment Plan for Primary Diagnosis: Paranoid schizophrenia (HCC) Long Term Goal(s): Safe transition to appropriate next level of care at discharge, Engage patient in therapeutic group addressing interpersonal concerns.  Short Term Goals: Engage patient in  aftercare planning with referrals and resources, Increase social support, Increase ability to appropriately verbalize feelings, Increase emotional regulation, Facilitate acceptance of mental health diagnosis and concerns, Facilitate patient progression through stages of change regarding substance use diagnoses and concerns, Identify triggers associated with mental health/substance abuse issues, and Increase skills for wellness and recovery  Therapeutic Interventions: Assess for all discharge needs, 1 to 1 time with Social worker, Explore available resources and support systems, Assess for adequacy in community support network, Educate family and significant other(s) on suicide prevention, Complete Psychosocial Assessment, Interpersonal group therapy.  Evaluation of Outcomes: Progressing   Progress in Treatment: Attending groups: Yes. Participating in groups: Yes. Taking medication as prescribed: Yes. Toleration medication: Yes. Family/Significant other contact made: Yes, individual(s) contacted:  Mark [name] [phone]) Patient understands diagnosis: Yes. Discussing patient identified problems/goals with staff: Yes. Medical problems stabilized or resolved: Yes. Denies suicidal/homicidal ideation: Yes. Issues/concerns per patient self-inventory: No. Other: None  New problem(s) identified: No, Describe:  None identified Update 05/03/23: No changes at this time. Update 05/08/23: No changes at this time. Update 05/13/2023: No changes at this time. Update 05/18/2023: No changes at this time.    New Short Term/Long Term Goal(s):  elimination of symptoms of psychosis, medication management for mood stabilization; elimination of SI thoughts; development of comprehensive mental wellness plan. 05/03/23 Update: No changes at this time. Update 05/08/23: No changes at this time.   Update 05/13/2023: No changes at this time. Update 05/18/2023: No changes at this time.    Patient Goals:   To stay out of trouble and move back home 05/03/23 Update: No changes at this time. Update 05/08/23: No changes at this time.   Update 05/13/2023: No changes at this time. Update 05/18/2023: No changes at this time.    Discharge Plan or Barriers: CSW will assist with appropriate discharge planning 05/03/23 Update: No changes at this time. Update 05/08/23: Pt's guardian reports that he cannot come back home due to restraining order. CSW will discuss with leadership next steps for safe discharge planning. Update 05/13/2023: No changes at this time. Update 05/18/2023: Pt awaiting care coordinator through [facility] to assist with placement   Reason for Continuation of Hospitalization: Aggression Medication stabilization   Estimated Length of Stay: 1 to 7 days 05/03/23 Update: No changes at this time. Update 05/08/23: TBD  Update 05/13/2023: TBD Update 05/18/2023: TBD  Last 3 [place] Suicide Severity Risk Score: Flowsheet Row Admission (Current) from 04/27/2023 in [facility] BE[facility] ED from 12/31/2022 in [facility] ED from 12/29/2022 in [facility] Emergency Department at [facility]  C-SSRS RISK CATEGORY No Risk No Risk No Risk       Last [facility] 2/9 Scores:     No data to display  Scribe for Treatment Team: Lum JONETTA Croft, ISRAEL 05/18/2023 10:24 AM

## 2023-05-18 NOTE — Progress Notes (Signed)
 [facility] MD Progress Note  05/18/2023  Mark [name]  MRN:  [id number]  Mark [name] is a 63 year old African-American male who is involuntarily admitted to psychiatry for aggressive behavior towards his parents. He lives with mom and dad in [place]. He has a long history of schizophrenia.   Subjective: Case discussed in multidisciplinary meeting, chart reviewed, patient seen today.  Per staff report patient is doing fine on the unit.  SW shared that care coordinator through [employer] company probably will help with housing. Today during the assessment the patient endorsed fine mood.  Patient is focused on going home.  Patient was informed that social worker is working on placement.  He was calm and easily redirected.  Patient denies auditory or visual hallucinations.  Patient denies thoughts of harming himself or others.  He is more visible on the unit and has been attending groups.  No new acute events overnight.  Principal Problem: Paranoid schizophrenia (HCC) Diagnosis: Principal Problem:   Paranoid schizophrenia (HCC)   Past Psychiatric History: He has a long history of schizophrenia.  Past admission in February 2023, patient was discharged on Depakote  500 mg twice daily, Cogentin  1 mg twice daily, Haldol  5 mg twice daily and Neurontin  400 mg 3 times daily also clozapine  was discontinued during that hospitalization,Past Psychotropic medications included clozapine , Depakote , Haldol , Cogentin , Inderal , trazodone  and Vistaril .    Past Medical History:  Past Medical History:  Diagnosis Date   COPD (chronic obstructive pulmonary disease) (HCC)    Hypertension    Schizophrenia (HCC)     Past Surgical History:  Procedure Laterality Date   LOWER EXTREMITY ANGIOGRAPHY N/A 09/18/2017   Procedure: LOWER EXTREMITY ANGIOGRAPHY- Recheck lysis;  Surgeon: Sheree Penne Bruckner, MD;  Location: [facility] INVASIVE CV LAB;  Service: Cardiovascular;  Laterality: N/A;   PERIPHERAL VASCULAR INTERVENTION  Left 09/18/2017   Procedure: PERIPHERAL VASCULAR INTERVENTION;  Surgeon: Sheree Penne Bruckner, MD;  Location: [facility] INVASIVE CV LAB;  Service: Cardiovascular;  Laterality: Left;   THROMBECTOMY FEMORAL ARTERY Left 09/17/2017   Procedure: POSSIBLE THROMBECTOMY;  Surgeon: Sheree Penne Bruckner, MD;  Location: [facility] OR;  Service: Vascular;  Laterality: Left;   VENOGRAM Left 09/17/2017   Procedure: ULTRASOUND POPLITEAL ACCESS; CENTRAL VENOGRAM, IVVS, LYSIS CATHETER PLACEMENT;  Surgeon: Sheree Penne Bruckner, MD;  Location: [facility] OR;  Service: Vascular;  Laterality: Left;    Social History:  Social History   Substance and Sexual Activity  Alcohol Use No     Social History   Substance and Sexual Activity  Drug Use Never    Social History   Socioeconomic History   Marital status: Single    Spouse name: Not on file   Number of children: Not on file   Years of education: Not on file   Highest education level: Not on file  Occupational History   Not on file  Tobacco Use   Smoking status: Every Day    Current packs/day: 0.50    Types: Cigarettes   Smokeless tobacco: Never  Vaping Use   Vaping status: Never Used  Substance and Sexual Activity   Alcohol use: No   Drug use: Never   Sexual activity: Not Currently  Other Topics Concern   Not on file  Social History Narrative   Not on file   Social Drivers of Health   Financial Resource Strain: Not on file  Food Insecurity: No Food Insecurity (04/27/2023)   Hunger Vital Sign    Worried About Running Out of Food in the Last Year: Never  true    Ran Out of Food in the Last Year: Never true  Transportation Needs: No Transportation Needs (04/27/2023)   PRAPARE - Administrator, Civil Service (Medical): No    Lack of Transportation (Non-Medical): No  Physical Activity: Not on file  Stress: Not on file  Social Connections: Not on file                           Sleep: Fair  Appetite:  Fair  Current  Medications: Current Facility-Administered Medications  Medication Dose Route Frequency Provider Last Rate Last Admin   acetaminophen  (TYLENOL ) tablet 650 mg  650 mg Oral Q6H PRN Motley-Mangrum, [name], PMHNP       alum & mag hydroxide-simeth (MAALOX/MYLANTA) 200-200-20 MG/5ML suspension 30 mL  30 mL Oral Q4H PRN Motley-Mangrum, [name], PMHNP   30 mL at 05/15/23 1351   amantadine  (SYMMETREL ) capsule 100 mg  100 mg Oral BID Victoria Ruts, MD   100 mg at 05/18/23 0844   amLODipine  (NORVASC ) tablet 5 mg  5 mg Oral Daily Deserea Bordley, MD   5 mg at 05/18/23 0844   aspirin  EC tablet 81 mg  81 mg Oral Daily Lilburn Straw, MD   81 mg at 05/18/23 0844   haloperidol  (HALDOL ) tablet 5 mg  5 mg Oral TID PRN Motley-Mangrum, [name], PMHNP   5 mg at 05/09/23 9166   And   diphenhydrAMINE  (BENADRYL ) capsule 50 mg  50 mg Oral TID PRN Motley-Mangrum, [name], PMHNP   50 mg at 05/09/23 9165   haloperidol  lactate (HALDOL ) injection 10 mg  10 mg Intramuscular TID PRN Motley-Mangrum, [name], PMHNP   10 mg at 05/08/23 1127   And   diphenhydrAMINE  (BENADRYL ) injection 50 mg  50 mg Intramuscular TID PRN Motley-Mangrum, [name], PMHNP       And   LORazepam  (ATIVAN ) injection 2 mg  2 mg Intramuscular TID PRN Motley-Mangrum, [name], PMHNP       haloperidol  lactate (HALDOL ) injection 5 mg  5 mg Intramuscular TID PRN Motley-Mangrum, [name], PMHNP       And   diphenhydrAMINE  (BENADRYL ) injection 50 mg  50 mg Intramuscular TID PRN Motley-Mangrum, [name], PMHNP       And   LORazepam  (ATIVAN ) injection 2 mg  2 mg Intramuscular TID PRN Motley-Mangrum, [name], PMHNP       divalproex  (DEPAKOTE  ER) 24 hr tablet 500 mg  500 mg Oral BID Motley-Mangrum, [name], PMHNP   500 mg at 05/18/23 0844   gabapentin  (NEURONTIN ) capsule 400 mg  400 mg Oral BID Motley-Mangrum, [name], PMHNP   400 mg at 05/18/23 0844   hydrOXYzine  (ATARAX ) tablet 25 mg  25 mg Oral TID PRN Motley-Mangrum, [name], PMHNP   25 mg at  05/17/23 2057   LORazepam  (ATIVAN ) tablet 1 mg  1 mg Oral Q8H PRN Motley-Mangrum, [name], PMHNP   1 mg at 05/09/23 9166   magnesium  hydroxide (MILK OF MAGNESIA) suspension 30 mL  30 mL Oral Daily PRN Motley-Mangrum, [name], PMHNP       OLANZapine  (ZYPREXA ) tablet 15 mg  15 mg Oral QHS Bradlee Bridgers, MD   15 mg at 05/17/23 2057   traZODone  (DESYREL ) tablet 100 mg  100 mg Oral QHS PRN Motley-Mangrum, [name], PMHNP   100 mg at 05/17/23 2057   valbenazine  ([name] ) capsule 40 mg  40 mg Oral Daily Cam Ade  Edward, DO   40 mg at 05/18/23 9155    Lab Results:  Results for orders placed or performed during the hospital encounter of 04/27/23 (from the past 48 hours)  Valproic acid  level     Status: None   Collection Time: 05/17/23  6:30 AM  Result Value Ref Range   Valproic Acid  Lvl 60 50.0 - 100.0 ug/mL    Comment: Performed at [facility], [address] [place], [place] [address]  ALT     Status: None   Collection Time: 05/17/23  6:30 AM  Result Value Ref Range   ALT 10 0 - 44 U/L    Comment: Performed at [facility], [address] [place], [place] [address]  AST     Status: None   Collection Time: 05/17/23  6:30 AM  Result Value Ref Range   AST 16 15 - 41 U/L    Comment: Performed at [facility], [address] [place], [place] [address]    Blood Alcohol level:  Lab Results  Component Value Date   [facility] <10 04/25/2023   ETH <10 12/31/2022    Metabolic Disorder Labs: Lab Results  Component Value Date   HGBA1C 5.5 12/31/2022   MPG 111.15 12/31/2022   MPG 134.11 12/29/2021   No results found for: PROLACTIN Lab Results  Component Value Date   CHOL 133 12/31/2022   TRIG 39 12/31/2022   HDL 54 12/31/2022   CHOLHDL 2.5 12/31/2022   VLDL 8 12/31/2022   LDLCALC 71 12/31/2022   LDLCALC 69 12/01/2022     Musculoskeletal: Strength & Muscle Tone: within normal limits Gait & Station: normal Patient leans: N/A  Psychiatric  Specialty Exam:  Presentation  General Appearance:  Appropriate for Environment  Eye Contact: Good  Speech: Spontaneous  Speech Volume: Normal  Handedness: Right   Mood and Affect  Mood: Fine  Affect: Stable and calm   Thought Process  Thought Processes: Goal-directed, focused on going home  Descriptions of Associations: Intact  Orientation:Full (Time, Place and Person)  Thought Content: Improving  History of Schizophrenia/Schizoaffective disorder:Yes  Duration of Psychotic Symptoms:Greater than six months  Hallucinations: Denies  ideas of Reference:None  Suicidal Thoughts: Denies  Homicidal Thoughts: Denies  Sensorium  Memory: Fair  Judgment: Improving  Insight: Improving   Executive Functions  Concentration: Fair  Attention Span: Fair   Language: Fair   Psychomotor Activity  Psychomotor Activity: Normal, has tremors in both upper extremities  Assets  Assets: Communication Skills; Desire for Improvement   Sleep  Sleep: Fair  Physical Exam: Physical Exam Constitutional:      Appearance: Normal appearance.  HENT:     Head: Normocephalic and atraumatic.     Nose: No congestion.  Eyes:     Pupils: Pupils are equal, round, and reactive to light.  Cardiovascular:     Rate and Rhythm: Regular rhythm.  Pulmonary:     Effort: Pulmonary effort is normal.  Skin:    General: Skin is warm.  Neurological:     General: No focal deficit present.     Mental Status: He is alert and oriented to person, place, and time.    Review of Systems  Constitutional:  Negative for chills and fever.  HENT:  Negative for hearing loss and tinnitus.   Eyes:  Negative for blurred vision.  Respiratory:  Negative for cough and shortness of breath.   Cardiovascular:  Negative for chest pain and palpitations.  Gastrointestinal:  Negative for nausea  and vomiting.   Blood pressure (!) 144/72, pulse 63, temperature 97.9 F (36.6 C), resp. rate  18, height 5' 11 (1.803 m), weight 71.2 kg, SpO2 100%. Body mass index is 21.9 kg/m.   Treatment Plan Summary: Daily contact with patient to assess and evaluate symptoms and progress in treatment and Medication management  Continue on olanzapine  15 mg at bedtime,  Depakote  500 mg po BID Will continue on [name]  40 mg p.o. daily Continue Norvasc  5 mg by mouth daily  Valproic acid  level : 60, ALT/AST 10/16   Wenceslao Harries, MD

## 2023-05-18 NOTE — Progress Notes (Signed)
   05/18/23 1610  15 Minute Checks  Location Dayroom  Visual Appearance Calm  Behavior Composed  Sleep (Behavioral Health Patients Only)  Calculate sleep? (Click Yes once per 24 hr at 0600 safety check) Yes  Documented sleep last 24 hours 4.5

## 2023-05-18 NOTE — Progress Notes (Signed)
   05/18/23 0737  Psych Admission Type (Psych Patients Only)  Admission Status Involuntary  Psychosocial Assessment  Patient Complaints Irritability  Eye Contact Brief  Facial Expression Animated;Anxious  Affect Anxious  Speech Loud;Slurred  Interaction Minimal  Motor Activity Tremors;Pacing  Appearance/Hygiene Bizarre  Behavior Characteristics Cooperative  Mood Labile  Thought Process  Coherency Disorganized  Content Preoccupation  Delusions None reported or observed  Perception WDL  Hallucination None reported or observed  Judgment Impaired  Confusion Mild  Danger to Self  Current suicidal ideation? Denies  Danger to Others  Danger to Others None reported or observed

## 2023-05-18 NOTE — Group Note (Signed)
 Recreation Therapy Group Note   Group Topic:Relaxation  Group Date: 05/18/2023 Start Time: 1100 End Time: 1140 Facilitators: Celestia Jeoffrey BRAVO, LRT, CTRS Location:  Dayroom  Group Description: Chair Yoga. LRT and patients discussed the benefits of yoga and how it differs from strength exercises. LRT educated patients on the mental and physical benefits of yoga and deep breathing and how it can be used as a associate professor. LRT and patients followed along to a guided yoga session on the television that focused on all parts of the body, as well as deep breathing. Pt encouraged to stop movement at any time if they feel discomfort or pain.   Goal Area(s) Addressed: Patient will practice using relaxation technique. Patient will identify a new coping skill.  Patient will follow multistep directions to reduce anxiety and stress.   Affect/Mood: N/A   Participation Level: Did not attend    Clinical Observations/Individualized Feedback: Patient did not attend group.   Plan: Continue to engage patient in RT group sessions 2-3x/week.   Jeoffrey BRAVO Celestia, LRT, CTRS 05/18/2023 1:25 PM

## 2023-05-18 NOTE — Group Note (Signed)
 Recreation Therapy Group Note   Group Topic:Goal Setting  Group Date: 05/18/2023 Start Time: 1500 End Time: 1600 Facilitators: Celestia Jeoffrey BRAVO, LRT, CTRS Location:  Dayroom  Group Description: Product/process Development Scientist. Patients were given many different magazines, a glue stick, markers, and a piece of cardstock paper. LRT and pts discussed the importance of having goals in life. LRT and pts discussed the difference between short-term and long-term goals, as well as what a SMART goal is. LRT encouraged pts to create a vision board, with images they picked and then cut out with safety scissors from the magazine, for themselves, that capture their short and long-term goals. LRT encouraged pts to show and explain their vision board to the group.   Goal Area(s) Addressed:  Patient will gain knowledge of short vs. long term goals.  Patient will identify goals for themselves. Patient will practice setting SMART goals. Patient will verbalize their goals to LRT and peers.   Affect/Mood: N/A   Participation Level: Did not attend    Clinical Observations/Individualized Feedback: Patient did not attend group.  Plan: Continue to engage patient in RT group sessions 2-3x/week.   Jeoffrey BRAVO Celestia, LRT, CTRS 05/18/2023 5:19 PM

## 2023-05-18 NOTE — BHH Counselor (Signed)
 Pt reports to CSW that he was in [facility] group home in [place], [place]. Pt reports that the home has been moved out to [place].   CSW contacted group home at [phone], CSW left HIPAA compliant VM.   Lum Croft, MSW, [place] 05/18/2023 1:31 PM

## 2023-05-19 DIAGNOSIS — F200 Paranoid schizophrenia: Secondary | ICD-10-CM | POA: Diagnosis not present

## 2023-05-19 NOTE — Group Note (Signed)
 Date:  05/19/2023 Time:  6:17 AM  Group Topic/Focus:  Wrap-Up Group:   The focus of this group is to help patients review their daily goal of treatment and discuss progress on daily workbooks.    Participation Level:  Active  Participation Quality:  Appropriate and Attentive  Affect:  Appropriate  Cognitive:  Alert and Appropriate  Insight: Appropriate, Good, and Improving  Engagement in Group:  Developing/Improving and Engaged  Modes of Intervention:  Discussion, Rapport Building, and Support  Additional Comments:     Mark [name] 05/19/2023, 6:17 AM

## 2023-05-19 NOTE — Group Note (Signed)
 Recreation Therapy Group Note   Group Topic:Emotion Expression  Group Date: 05/19/2023 Start Time: 1100 End Time: 1140 Facilitators: Celestia Jeoffrey BRAVO, LRT, CTRS Location:  Dayroom  Group Description: Picture a Diplomatic Services Operational Officer. Patients and LRT discuss what it means to be "at peace", what it feels like physically and mentally. Pts are given a canvas and watercolor paint to use and encouraged to draw their idea of a peaceful place. Pts and LRT discuss how they use this in their daily life post discharge. Pts are encouraged to take their canvas home with them as a reminder of their peaceful place whenever they are feeling depressed, anxious, etc.    Goal Area(s) Addressed:  Patient will identify what it means to experience a "peaceful" emotion. Patient will identify a new coping skill.  Patient will express their emotions through art. Patients will increase communication by talking with LRT and peers while in group.   Affect/Mood: N/A   Participation Level: Did not attend    Clinical Observations/Individualized Feedback: Patient did not attend group.  Plan: Continue to engage patient in RT group sessions 2-3x/week.   Jeoffrey BRAVO Celestia, LRT, CTRS 05/19/2023 12:20 PM

## 2023-05-19 NOTE — Progress Notes (Signed)
   05/19/23 0400  Psych Admission Type (Psych Patients Only)  Admission Status Involuntary  Psychosocial Assessment  Patient Complaints Irritability  Eye Contact Brief  Facial Expression Anxious  Affect Anxious  Speech Pressured  Interaction Minimal  Motor Activity Tremors  Appearance/Hygiene Bizarre  Behavior Characteristics Cooperative  Mood Irritable  Thought Process  Coherency Disorganized  Content Preoccupation  Delusions None reported or observed  Perception WDL  Hallucination None reported or observed  Judgment Impaired  Confusion Mild  Danger to Self  Current suicidal ideation? Denies  Danger to Others  Danger to Others None reported or observed

## 2023-05-19 NOTE — BHH Counselor (Signed)
 CSW spoke with Delon, RN Case Manager [phone]) about patient's care coordinator status.  CSW was informed that she does not follow inpatient clients, Ernestene Parkin does.  Delon reports that she will send an email to Laquana asking for her to follow up with this clinical research associate.  Sherryle Margo, MSW, LCSW 05/19/2023 1:15 PM

## 2023-05-19 NOTE — Plan of Care (Signed)
 D: Pt alert and oriented. Pt denies experiencing any anxiety/depression at this time. Pt denies experiencing any pain at this time. Pt denies experiencing any SI/HI, or AVH at this time.   A: Scheduled medications administered to pt, per MD orders. Support and encouragement provided. Frequent verbal contact made. Routine safety checks conducted q15 minutes.   R: No adverse drug reactions noted. Pt verbally contracts for safety at this time. Pt compliant with medications and treatment plan. Pt interacts minimally with others on the unit. Pt remains safe at this time. Plan of care ongoing.  1225 pt became angry and physically aggressive after getting off the phone with someone. Pt went back to his room and repetitively slammed his door. This writer pulled prn PO medications for agitation. When this writer was exiting the nutrition room with a bottle of water  and the medications the pt was within a foot of this clinical research associate. This clinical research associate offered the medications to which the pt slapped the cup of medications and this writer's forearm stating I an't taking no more damn meds. The pt knocked the pills all over the floor and left a red hand print on this writer's arm. This writer picked up all of the pills and wasted them with another nurse and proceeded to have security called. While security was in route this writer pulled IM medications. This clinical research associate and security knocked on the pt's door to which the pt shouted he was in the shower. This clinical research associate asked if he was almost done to which the pt stated no. This clinical research associate and security waited for the pt to finish his shower. Once finished with his shower this clinical research associate and security notified the pt that he was going to be receiving injections to help calm him down after slamming the door, knocking medications on the floor, and slapping this clinical research associate. The pt denied making contact with this pt and only acknowledged knocking the cup of pills onto the floor. Security supplied support while  injections were administered. The pt was seen shortly in the dayroom before going to his room and sleeping.    Problem: Coping: Goal: Ability to verbalize frustrations and anger appropriately will improve Outcome: Not Progressing Goal: Ability to demonstrate self-control will improve Outcome: Progressing

## 2023-05-19 NOTE — Progress Notes (Signed)
 [facility] MD Progress Note  05/19/2023  Mark [name]  MRN:  [id number]  Mark [name] is a 63 year old African-American male who is involuntarily admitted to psychiatry for aggressive behavior towards his parents. He lives with mom and dad in [place]. He has a long history of schizophrenia.   Subjective: Case discussed in multidisciplinary meeting, chart reviewed, patient seen today.  Per staff report patient is doing fine on the unit.  SW shared that care coordinator through [employer] company probably will help with housing. Today during the assessment the patient endorsed fine mood.  Patient is focused on going home.  Patient was informed that social worker is working on placement.  He was calm and easily redirected.  Patient denies auditory or visual hallucinations.  Patient denies thoughts of harming himself or others.  He is more visible on the unit and has been attending groups.  Later in the afternoon patient had a phone call.  Reportedly patient got upset and agitated during the phone call.  He slammed the phone and also his room door.  He was loud with threatening tone yelling in his room.  The nurse went to offer some as needed medicine, the patient hit the nurse's arm and threw the medicine away.  Security was called.  Patient received IM Haldol ,, Ativan , and Benadryl  per agitation protocol  Principal Problem: Paranoid schizophrenia (HCC) Diagnosis: Principal Problem:   Paranoid schizophrenia (HCC)   Past Psychiatric History: He has a long history of schizophrenia.  Past admission in February 2023, patient was discharged on Depakote  500 mg twice daily, Cogentin  1 mg twice daily, Haldol  5 mg twice daily and Neurontin  400 mg 3 times daily also clozapine  was discontinued during that hospitalization,Past Psychotropic medications included clozapine , Depakote , Haldol , Cogentin , Inderal , trazodone  and Vistaril .    Past Medical History:  Past Medical History:  Diagnosis Date   COPD  (chronic obstructive pulmonary disease) (HCC)    Hypertension    Schizophrenia (HCC)     Past Surgical History:  Procedure Laterality Date   LOWER EXTREMITY ANGIOGRAPHY N/A 09/18/2017   Procedure: LOWER EXTREMITY ANGIOGRAPHY- Recheck lysis;  Surgeon: Sheree Penne Bruckner, MD;  Location: [facility] INVASIVE CV LAB;  Service: Cardiovascular;  Laterality: N/A;   PERIPHERAL VASCULAR INTERVENTION Left 09/18/2017   Procedure: PERIPHERAL VASCULAR INTERVENTION;  Surgeon: Sheree Penne Bruckner, MD;  Location: [facility] INVASIVE CV LAB;  Service: Cardiovascular;  Laterality: Left;   THROMBECTOMY FEMORAL ARTERY Left 09/17/2017   Procedure: POSSIBLE THROMBECTOMY;  Surgeon: Sheree Penne Bruckner, MD;  Location: [facility] OR;  Service: Vascular;  Laterality: Left;   VENOGRAM Left 09/17/2017   Procedure: ULTRASOUND POPLITEAL ACCESS; CENTRAL VENOGRAM, IVVS, LYSIS CATHETER PLACEMENT;  Surgeon: Sheree Penne Bruckner, MD;  Location: [facility] OR;  Service: Vascular;  Laterality: Left;    Social History:  Social History   Substance and Sexual Activity  Alcohol Use No     Social History   Substance and Sexual Activity  Drug Use Never    Social History   Socioeconomic History   Marital status: Single    Spouse name: Not on file   Number of children: Not on file   Years of education: Not on file   Highest education level: Not on file  Occupational History   Not on file  Tobacco Use   Smoking status: Every Day    Current packs/day: 0.50    Types: Cigarettes   Smokeless tobacco: Never  Vaping Use   Vaping status: Never Used  Substance and Sexual Activity  Alcohol use: No   Drug use: Never   Sexual activity: Not Currently  Other Topics Concern   Not on file  Social History Narrative   Not on file   Social Drivers of Health   Financial Resource Strain: Not on file  Food Insecurity: No Food Insecurity (04/27/2023)   Hunger Vital Sign    Worried About Running Out of Food in the Last Year: Never true     Ran Out of Food in the Last Year: Never true  Transportation Needs: No Transportation Needs (04/27/2023)   PRAPARE - Administrator, Civil Service (Medical): No    Lack of Transportation (Non-Medical): No  Physical Activity: Not on file  Stress: Not on file  Social Connections: Not on file                           Sleep: Fair  Appetite:  Fair  Current Medications: Current Facility-Administered Medications  Medication Dose Route Frequency Provider Last Rate Last Admin   acetaminophen  (TYLENOL ) tablet 650 mg  650 mg Oral Q6H PRN Motley-Mangrum, [name], PMHNP       alum & mag hydroxide-simeth (MAALOX/MYLANTA) 200-200-20 MG/5ML suspension 30 mL  30 mL Oral Q4H PRN Motley-Mangrum, [name], PMHNP   30 mL at 05/15/23 1351   amantadine  (SYMMETREL ) capsule 100 mg  100 mg Oral BID Victoria Ruts, MD   100 mg at 05/19/23 0909   amLODipine  (NORVASC ) tablet 5 mg  5 mg Oral Daily Zanyia Silbaugh, MD   5 mg at 05/19/23 9090   aspirin  EC tablet 81 mg  81 mg Oral Daily Jimi Giza, MD   81 mg at 05/19/23 9090   haloperidol  (HALDOL ) tablet 5 mg  5 mg Oral TID PRN Motley-Mangrum, [name], PMHNP   5 mg at 05/09/23 9166   And   diphenhydrAMINE  (BENADRYL ) capsule 50 mg  50 mg Oral TID PRN Motley-Mangrum, [name], PMHNP   50 mg at 05/09/23 9165   haloperidol  lactate (HALDOL ) injection 10 mg  10 mg Intramuscular TID PRN Motley-Mangrum, [name], PMHNP   10 mg at 05/08/23 1127   And   diphenhydrAMINE  (BENADRYL ) injection 50 mg  50 mg Intramuscular TID PRN Motley-Mangrum, [name], PMHNP       And   LORazepam  (ATIVAN ) injection 2 mg  2 mg Intramuscular TID PRN Motley-Mangrum, [name], PMHNP       haloperidol  lactate (HALDOL ) injection 5 mg  5 mg Intramuscular TID PRN Motley-Mangrum, [name], PMHNP       And   diphenhydrAMINE  (BENADRYL ) injection 50 mg  50 mg Intramuscular TID PRN Motley-Mangrum, [name], PMHNP       And   LORazepam  (ATIVAN ) injection 2 mg  2 mg  Intramuscular TID PRN Motley-Mangrum, [name], PMHNP       divalproex  (DEPAKOTE  ER) 24 hr tablet 500 mg  500 mg Oral BID Motley-Mangrum, [name], PMHNP   500 mg at 05/19/23 0909   gabapentin  (NEURONTIN ) capsule 400 mg  400 mg Oral BID Motley-Mangrum, [name], PMHNP   400 mg at 05/19/23 0909   hydrOXYzine  (ATARAX ) tablet 25 mg  25 mg Oral TID PRN Motley-Mangrum, [name], PMHNP   25 mg at 05/17/23 2057   LORazepam  (ATIVAN ) tablet 1 mg  1 mg Oral Q8H PRN Motley-Mangrum, [name], PMHNP   1 mg at 05/09/23 9166   magnesium  hydroxide (MILK OF MAGNESIA) suspension 30 mL  30  mL Oral Daily PRN Motley-Mangrum, [name], PMHNP       OLANZapine  (ZYPREXA ) tablet 15 mg  15 mg Oral QHS Syleena Mchan, MD   15 mg at 05/18/23 2131   traZODone  (DESYREL ) tablet 100 mg  100 mg Oral QHS PRN Motley-Mangrum, [name], PMHNP   100 mg at 05/18/23 2129   valbenazine  ([name] ) capsule 40 mg  40 mg Oral Daily Cam Charlie Loving, DO   40 mg at 05/19/23 9090    Lab Results:  No results found for this or any previous visit (from the past 48 hours).   Blood Alcohol level:  Lab Results  Component Value Date   ETH <10 04/25/2023   ETH <10 12/31/2022    Metabolic Disorder Labs: Lab Results  Component Value Date   HGBA1C 5.5 12/31/2022   MPG 111.15 12/31/2022   MPG 134.11 12/29/2021   No results found for: PROLACTIN Lab Results  Component Value Date   CHOL 133 12/31/2022   TRIG 39 12/31/2022   HDL 54 12/31/2022   CHOLHDL 2.5 12/31/2022   VLDL 8 12/31/2022   LDLCALC 71 12/31/2022   LDLCALC 69 12/01/2022     Musculoskeletal: Strength & Muscle Tone: within normal limits Gait & Station: normal Patient leans: N/A  Psychiatric Specialty Exam:  Presentation  General Appearance:  Appropriate for Environment  Eye Contact: Good  Speech: Spontaneous  Speech Volume: Normal  Handedness: Right   Mood and Affect  Mood: Fine  Affect: Stable and calm, during the interview later  patient was stated   Thought Process  Thought Processes: Goal-directed, focused on going home  Descriptions of Associations: Intact  Orientation:Full (Time, Place and Person)  Thought Content: Improving  History of Schizophrenia/Schizoaffective disorder:Yes  Duration of Psychotic Symptoms:Greater than six months  Hallucinations: Denies  ideas of Reference:None  Suicidal Thoughts: Denies  Homicidal Thoughts: Denies  Sensorium  Memory: Fair  Judgment: Improving  Insight: Improving   Executive Functions  Concentration: Fair  Attention Span: Fair   Language: Fair   Psychomotor Activity  Psychomotor Activity: Normal, has tremors in both upper extremities  Assets  Assets: Communication Skills; Desire for Improvement   Sleep  Sleep: Fair  Physical Exam: Physical Exam Constitutional:      Appearance: Normal appearance.  HENT:     Head: Normocephalic and atraumatic.     Nose: No congestion.  Eyes:     Pupils: Pupils are equal, round, and reactive to light.  Cardiovascular:     Rate and Rhythm: Regular rhythm.  Pulmonary:     Effort: Pulmonary effort is normal.  Skin:    General: Skin is warm.  Neurological:     General: No focal deficit present.     Mental Status: He is alert and oriented to person, place, and time.    Review of Systems  Constitutional:  Negative for chills and fever.  HENT:  Negative for hearing loss and tinnitus.   Eyes:  Negative for blurred vision.  Respiratory:  Negative for cough and shortness of breath.   Cardiovascular:  Negative for chest pain and palpitations.  Gastrointestinal:  Negative for nausea and vomiting.   Blood pressure (!) 134/108, pulse 71, temperature 97.8 F (36.6 C), resp. rate 15, height 5' 11 (1.803 m), weight 71.2 kg, SpO2 100%. Body mass index is 21.9 kg/m.   Treatment Plan Summary: Daily contact with patient to assess and evaluate symptoms and progress in treatment and Medication  management  Continue on olanzapine  15 mg at bedtime,  Depakote  500 mg po BID Will continue on [name]  40 mg p.o. daily Continue Norvasc  5 mg by mouth daily  Valproic acid  level : 60, ALT/AST 10/16   Wenceslao Harries, MD

## 2023-05-19 NOTE — BHH Counselor (Signed)
 CSW spoke with Corrie Dandy at [facility].  Patient remains on the wait list at this time.  Penni Homans, MSW, LCSW 05/19/2023 4:40 PM

## 2023-05-19 NOTE — Group Note (Signed)
 Recreation Therapy Group Note   Group Topic:Relaxation  Group Date: 05/19/2023 Start Time: 1400 End Time: 1435 Facilitators: Celestia Jeoffrey BRAVO, LRT, CTRS Location:  Dayroom  Group Description: Meditation. LRT and patients discussed what they know about meditation and mindfulness. LRT played a Deep Breathing Meditation exercise script for patients to follow along to. LRT and patients discussed how meditation and deep breathing can be used as a coping skill post--discharge to help manage symptoms of stress.   Goal Area(s) Addressed: Patient will practice using relaxation technique. Patient will identify a new coping skill.  Patient will follow multistep directions to reduce anxiety and stress.   Affect/Mood: N/A   Participation Level: Did not attend    Clinical Observations/Individualized Feedback: Patient did not attend group.   Plan: Continue to engage patient in RT group sessions 2-3x/week.   Jeoffrey BRAVO Celestia, LRT, CTRS 05/19/2023 2:37 PM

## 2023-05-19 NOTE — BHH Counselor (Signed)
 CSW attempted to contact [facility] group [facility].  CSW left HIPAA compliant voicemail.  Penni Homans, MSW, LCSW 05/19/2023 1:09 PM

## 2023-05-20 DIAGNOSIS — F200 Paranoid schizophrenia: Secondary | ICD-10-CM | POA: Diagnosis not present

## 2023-05-20 NOTE — Progress Notes (Signed)
 [facility] MD Progress Note  05/20/2023  Mark [name]  MRN:  [id number]  Mark [name] is a 63 year old African-American male who is involuntarily admitted to psychiatry for aggressive behavior towards his parents. He lives with mom and dad in [place]. He has a long history of schizophrenia.   Subjective: Case discussed in multidisciplinary meeting, chart reviewed, patient seen today.  Prior SW report care coordinator through [employer] company probably will help with housing.  Yesterday afternoon, reportedly patient got upset and agitated during the phone call.  He slammed the phone and also his room door.  He was loud with threatening tone yelling in his room.  The nurse went to offer some as needed medicine, the patient hit the nurse's arm and threw the medicine away.  Security was called.  Patient received IM Haldol ,, Ativan , and Benadryl  per agitation protocol  Today during the assessment the patient endorsed fine mood.  Patient said that yesterday he tried to call his father.  His father did not answer the call.  Patient reports that he was upset and he went in his room and slammed the door.  Patient was encouraged to use positive coping strategies and situations like that.  Today patient denies any intention of harming herself or others.  He was calm during the assessment.  He said that he slept fine last night.  His appetite has improved.  Patient has refused his scheduled medicine last night, he took the medicines this morning.  Patient reports that he was sleepy and groggy from as needed injections so he did not want to take extra meds.  Patient denies auditory or visual hallucination.    Principal Problem: Paranoid schizophrenia (HCC) Diagnosis: Principal Problem:   Paranoid schizophrenia (HCC)   Past Psychiatric History: He has a long history of schizophrenia.  Past admission in February 2023, patient was discharged on Depakote  500 mg twice daily, Cogentin  1 mg twice daily, Haldol   5 mg twice daily and Neurontin  400 mg 3 times daily also clozapine  was discontinued during that hospitalization,Past Psychotropic medications included clozapine , Depakote , Haldol , Cogentin , Inderal , trazodone  and Vistaril .    Past Medical History:  Past Medical History:  Diagnosis Date   COPD (chronic obstructive pulmonary disease) (HCC)    Hypertension    Schizophrenia (HCC)     Past Surgical History:  Procedure Laterality Date   LOWER EXTREMITY ANGIOGRAPHY N/A 09/18/2017   Procedure: LOWER EXTREMITY ANGIOGRAPHY- Recheck lysis;  Surgeon: Sheree Penne Bruckner, MD;  Location: [facility] INVASIVE CV LAB;  Service: Cardiovascular;  Laterality: N/A;   PERIPHERAL VASCULAR INTERVENTION Left 09/18/2017   Procedure: PERIPHERAL VASCULAR INTERVENTION;  Surgeon: Sheree Penne Bruckner, MD;  Location: [facility] INVASIVE CV LAB;  Service: Cardiovascular;  Laterality: Left;   THROMBECTOMY FEMORAL ARTERY Left 09/17/2017   Procedure: POSSIBLE THROMBECTOMY;  Surgeon: Sheree Penne Bruckner, MD;  Location: [facility] OR;  Service: Vascular;  Laterality: Left;   VENOGRAM Left 09/17/2017   Procedure: ULTRASOUND POPLITEAL ACCESS; CENTRAL VENOGRAM, IVVS, LYSIS CATHETER PLACEMENT;  Surgeon: Sheree Penne Bruckner, MD;  Location: [facility] OR;  Service: Vascular;  Laterality: Left;    Social History:  Social History   Substance and Sexual Activity  Alcohol Use No     Social History   Substance and Sexual Activity  Drug Use Never    Social History   Socioeconomic History   Marital status: Single    Spouse name: Not on file   Number of children: Not on file   Years of education: Not on file  Highest education level: Not on file  Occupational History   Not on file  Tobacco Use   Smoking status: Every Day    Current packs/day: 0.50    Types: Cigarettes   Smokeless tobacco: Never  Vaping Use   Vaping status: Never Used  Substance and Sexual Activity   Alcohol use: No   Drug use: Never   Sexual activity: Not  Currently  Other Topics Concern   Not on file  Social History Narrative   Not on file   Social Drivers of Health   Financial Resource Strain: Not on file  Food Insecurity: No Food Insecurity (04/27/2023)   Hunger Vital Sign    Worried About Running Out of Food in the Last Year: Never true    Ran Out of Food in the Last Year: Never true  Transportation Needs: No Transportation Needs (04/27/2023)   PRAPARE - Administrator, Civil Service (Medical): No    Lack of Transportation (Non-Medical): No  Physical Activity: Not on file  Stress: Not on file  Social Connections: Not on file                           Sleep: Fair  Appetite:  Fair  Current Medications: Current Facility-Administered Medications  Medication Dose Route Frequency Provider Last Rate Last Admin   acetaminophen  (TYLENOL ) tablet 650 mg  650 mg Oral Q6H PRN Motley-Mangrum, [name], PMHNP       alum & mag hydroxide-simeth (MAALOX/MYLANTA) 200-200-20 MG/5ML suspension 30 mL  30 mL Oral Q4H PRN Motley-Mangrum, [name], PMHNP   30 mL at 05/15/23 1351   amantadine  (SYMMETREL ) capsule 100 mg  100 mg Oral BID Victoria Ruts, MD   100 mg at 05/20/23 0901   amLODipine  (NORVASC ) tablet 5 mg  5 mg Oral Daily Herta Hink, MD   5 mg at 05/20/23 0908   aspirin  EC tablet 81 mg  81 mg Oral Daily Illya Gienger, MD   81 mg at 05/20/23 0900   haloperidol  (HALDOL ) tablet 5 mg  5 mg Oral TID PRN Motley-Mangrum, [name], PMHNP   5 mg at 05/09/23 9166   And   diphenhydrAMINE  (BENADRYL ) capsule 50 mg  50 mg Oral TID PRN Motley-Mangrum, [name], PMHNP   50 mg at 05/09/23 0834   haloperidol  lactate (HALDOL ) injection 10 mg  10 mg Intramuscular TID PRN Motley-Mangrum, [name], PMHNP   10 mg at 05/19/23 1242   And   diphenhydrAMINE  (BENADRYL ) injection 50 mg  50 mg Intramuscular TID PRN Motley-Mangrum, [name], PMHNP       And   LORazepam  (ATIVAN ) injection 2 mg  2 mg Intramuscular TID PRN  Motley-Mangrum, [name], PMHNP       haloperidol  lactate (HALDOL ) injection 5 mg  5 mg Intramuscular TID PRN Motley-Mangrum, [name], PMHNP       And   diphenhydrAMINE  (BENADRYL ) injection 50 mg  50 mg Intramuscular TID PRN Motley-Mangrum, [name], PMHNP   50 mg at 05/19/23 1240   And   LORazepam  (ATIVAN ) injection 2 mg  2 mg Intramuscular TID PRN Motley-Mangrum, [name], PMHNP   2 mg at 05/19/23 1240   divalproex  (DEPAKOTE  ER) 24 hr tablet 500 mg  500 mg Oral BID Motley-Mangrum, [name], PMHNP   500 mg at 05/20/23 0901   gabapentin  (NEURONTIN ) capsule 400 mg  400 mg Oral BID Motley-Mangrum, [name], PMHNP   400 mg at 05/20/23  0900   hydrOXYzine  (ATARAX ) tablet 25 mg  25 mg Oral TID PRN Motley-Mangrum, [name], PMHNP   25 mg at 05/17/23 2057   LORazepam  (ATIVAN ) tablet 1 mg  1 mg Oral Q8H PRN Motley-Mangrum, [name], PMHNP   1 mg at 05/09/23 9166   magnesium  hydroxide (MILK OF MAGNESIA) suspension 30 mL  30 mL Oral Daily PRN Motley-Mangrum, [name], PMHNP       OLANZapine  (ZYPREXA ) tablet 15 mg  15 mg Oral QHS Ezreal Turay, MD   15 mg at 05/18/23 2131   traZODone  (DESYREL ) tablet 100 mg  100 mg Oral QHS PRN Motley-Mangrum, [name], PMHNP   100 mg at 05/18/23 2129   valbenazine  ([name] ) capsule 40 mg  40 mg Oral Daily Cam Charlie Loving, DO   40 mg at 05/20/23 0901    Lab Results:  No results found for this or any previous visit (from the past 48 hours).   Blood Alcohol level:  Lab Results  Component Value Date   ETH <10 04/25/2023   ETH <10 12/31/2022    Metabolic Disorder Labs: Lab Results  Component Value Date   HGBA1C 5.5 12/31/2022   MPG 111.15 12/31/2022   MPG 134.11 12/29/2021   No results found for: PROLACTIN Lab Results  Component Value Date   CHOL 133 12/31/2022   TRIG 39 12/31/2022   HDL 54 12/31/2022   CHOLHDL 2.5 12/31/2022   VLDL 8 12/31/2022   LDLCALC 71 12/31/2022   LDLCALC 69 12/01/2022     Musculoskeletal: Strength & Muscle  Tone: within normal limits Gait & Station: normal Patient leans: N/A  Psychiatric Specialty Exam:  Presentation  General Appearance:  Appropriate for Environment  Eye Contact: Good  Speech: Spontaneous  Speech Volume: Normal  Handedness: Right   Mood and Affect  Mood: Fine  Affect: Stable and calm,   Thought Process  Thought Processes: Goal-directed, focused on going home  Descriptions of Associations: Intact  Orientation:Full (Time, Place and Person)  Thought Content: Improving  History of Schizophrenia/Schizoaffective disorder:Yes  Duration of Psychotic Symptoms:Greater than six months  Hallucinations: Denies  ideas of Reference:None  Suicidal Thoughts: Denies  Homicidal Thoughts: Denies  Sensorium  Memory: Fair  Judgment: Improving  Insight: Improving   Executive Functions  Concentration: Fair  Attention Span: Fair   Language: Fair   Psychomotor Activity  Psychomotor Activity: Normal, has tremors in both upper extremities  Assets  Assets: Communication Skills; Desire for Improvement   Sleep  Sleep: Fair  Physical Exam: Physical Exam Constitutional:      Appearance: Normal appearance.  HENT:     Head: Normocephalic and atraumatic.     Nose: No congestion.  Eyes:     Pupils: Pupils are equal, round, and reactive to light.  Cardiovascular:     Rate and Rhythm: Regular rhythm.  Pulmonary:     Effort: Pulmonary effort is normal.  Skin:    General: Skin is warm.  Neurological:     General: No focal deficit present.     Mental Status: He is alert and oriented to person, place, and time.    Review of Systems  Constitutional:  Negative for chills and fever.  HENT:  Negative for hearing loss and tinnitus.   Eyes:  Negative for blurred vision.  Respiratory:  Negative for cough and shortness of breath.   Cardiovascular:  Negative for chest pain and palpitations.  Gastrointestinal:  Negative for nausea and  vomiting.   Blood pressure 126/70, pulse 71,  temperature 97.8 F (36.6 C), resp. rate 15, height 5' 11 (1.803 m), weight 71.2 kg, SpO2 100%. Body mass index is 21.9 kg/m.   Treatment Plan Summary: Daily contact with patient to assess and evaluate symptoms and progress in treatment and Medication management  Continue on olanzapine  15 mg at bedtime,  Depakote  500 mg po BID Will continue on [name]  40 mg p.o. daily Continue Norvasc  5 mg by mouth daily  Valproic acid  level : 60, ALT/AST 10/16   Wenceslao Harries, MD

## 2023-05-20 NOTE — Progress Notes (Signed)
 Pt was compliant with medications today.  05/20/23 1500  Charting Type  Charting Type Shift assessment  Safety Check Verification  Has the RN verified the 15 minute safety check completion? Yes  Neurological  Neuro (WDL) X  Orientation Level Disoriented to situation  HEENT  HEENT (WDL) X  Teeth Poor dental hygiene  Respiratory  Respiratory (WDL) WDL  Cardiac  Cardiac (WDL) X  Vascular  Vascular (WDL) WDL  Integumentary  Integumentary (WDL) WDL  Braden Scale (Ages 8 and up)  Sensory Perceptions 4  Moisture 4  Activity 4  Mobility 4  Nutrition 4  Friction and Shear 3  Braden Scale Score 23  Musculoskeletal  Musculoskeletal (WDL) WDL  Assistive Device None  Gastrointestinal  Gastrointestinal (WDL) WDL  GU Assessment  Genitourinary (WDL) WDL  Neurological  Level of Consciousness Alert

## 2023-05-20 NOTE — Plan of Care (Signed)
  Problem: Education: Goal: Mental status will improve Outcome: Progressing   

## 2023-05-20 NOTE — Progress Notes (Signed)
   05/19/23 2000  Psych Admission Type (Psych Patients Only)  Admission Status Involuntary  Psychosocial Assessment  Patient Complaints Irritability;Suspiciousness;Tension  Eye Contact Fair  Facial Expression Angry  Affect Irritable  Speech Pressured  Interaction Evasive  Motor Activity Tremors;Pacing  Appearance/Hygiene Bizarre  Behavior Characteristics Cooperative;Appropriate to situation  Mood Labile  Thought Process  Coherency Disorganized  Content Preoccupation  Delusions None reported or observed  Perception WDL  Hallucination None reported or observed  Judgment Impaired  Confusion Mild  Danger to Self  Current suicidal ideation? Denies  Danger to Others  Danger to Others None reported or observed   Patient alert and oriented x 2 with confusion to time and situation, he denies SI/HI/AVH, speech is pressured and tangential , affect is congruent with mood, not interacting appropriately with peers and staff, he is evasive of questions asked, he was noted restless and pacing the unit. 15 minutes safety checks maintained will continue to monitor.

## 2023-05-20 NOTE — Plan of Care (Signed)
  Problem: Education: Goal: Emotional status will improve Outcome: Progressing   Problem: Education: Goal: Knowledge of [facility] Education information/materials will improve Outcome: Progressing

## 2023-05-20 NOTE — Progress Notes (Addendum)
 Pt rates sleep fair, appetite fair and energy normal.  Pt endorses poor concentration.  Rates depression 6, anxiety 7 and hopelessness 7.  Denies SI HI AVH.  Pt was compliant with medications.  Continued monitoring for safety.   05/20/23 1800  Psych Admission Type (Psych Patients Only)  Admission Status Involuntary  Psychosocial Assessment  Patient Complaints Irritability  Eye Contact Fair  Facial Expression Sullen  Affect Depressed  Speech Slow  Interaction Isolative  Motor Activity Tremors  Appearance/Hygiene Unremarkable  Behavior Characteristics Cooperative  Mood Euthymic  Thought Process  Coherency WDL  Content Preoccupation  Delusions None reported or observed  Perception WDL  Hallucination None reported or observed  Judgment Impaired  Confusion Mild  Danger to Self  Current suicidal ideation? Denies  Danger to Others  Danger to Others None reported or observed

## 2023-05-20 NOTE — Group Note (Signed)
 Date:  05/19/23 Time:  3:06 AM  Group Topic/Focus:  Goals Group:   The focus of this group is to help patients establish daily goals to achieve during treatment and discuss how the patient can incorporate goal setting into their daily lives to aide in recovery.    Participation Level:  Active  Participation Quality:  Appropriate  Affect:  Appropriate  Cognitive:  Appropriate  Insight: Appropriate  Engagement in Group:  Engaged  Modes of Intervention:  Discussion   Dwayne [name] 05/20/2023, 3:06 AM

## 2023-05-21 DIAGNOSIS — F200 Paranoid schizophrenia: Secondary | ICD-10-CM | POA: Diagnosis not present

## 2023-05-21 NOTE — Progress Notes (Signed)
 [facility] MD Progress Note  05/21/2023  Mark [name]  MRN:  [id number]  Mark [name] is a 63 year old African-American male who is involuntarily admitted to psychiatry for aggressive behavior towards his parents. He lives with mom and dad in [place]. He has a long history of schizophrenia.   Subjective: Case discussed in multidisciplinary meeting, chart reviewed, patient seen today.  Prior SW report care coordinator through [employer] company probably will help with housing.   Today during the assessment the patient endorsed good mood.  He had questions regarding his discharge, patient was informed that child psychotherapist is working on placement.  Today patient denies any intention of harming herself or others.  He was calm during the assessment.  He said that he slept fine last night.  His appetite has improved.  Patient denies auditory or visual hallucination.    Principal Problem: Paranoid schizophrenia (HCC) Diagnosis: Principal Problem:   Paranoid schizophrenia (HCC)   Past Psychiatric History: He has a long history of schizophrenia.  Past admission in February 2023, patient was discharged on Depakote  500 mg twice daily, Cogentin  1 mg twice daily, Haldol  5 mg twice daily and Neurontin  400 mg 3 times daily also clozapine  was discontinued during that hospitalization,Past Psychotropic medications included clozapine , Depakote , Haldol , Cogentin , Inderal , trazodone  and Vistaril .    Past Medical History:  Past Medical History:  Diagnosis Date   COPD (chronic obstructive pulmonary disease) (HCC)    Hypertension    Schizophrenia (HCC)     Past Surgical History:  Procedure Laterality Date   LOWER EXTREMITY ANGIOGRAPHY N/A 09/18/2017   Procedure: LOWER EXTREMITY ANGIOGRAPHY- Recheck lysis;  Surgeon: Sheree Penne Bruckner, MD;  Location: [facility] INVASIVE CV LAB;  Service: Cardiovascular;  Laterality: N/A;   PERIPHERAL VASCULAR INTERVENTION Left 09/18/2017   Procedure: PERIPHERAL VASCULAR  INTERVENTION;  Surgeon: Sheree Penne Bruckner, MD;  Location: [facility] INVASIVE CV LAB;  Service: Cardiovascular;  Laterality: Left;   THROMBECTOMY FEMORAL ARTERY Left 09/17/2017   Procedure: POSSIBLE THROMBECTOMY;  Surgeon: Sheree Penne Bruckner, MD;  Location: [facility] OR;  Service: Vascular;  Laterality: Left;   VENOGRAM Left 09/17/2017   Procedure: ULTRASOUND POPLITEAL ACCESS; CENTRAL VENOGRAM, IVVS, LYSIS CATHETER PLACEMENT;  Surgeon: Sheree Penne Bruckner, MD;  Location: [facility] OR;  Service: Vascular;  Laterality: Left;    Social History:  Social History   Substance and Sexual Activity  Alcohol Use No     Social History   Substance and Sexual Activity  Drug Use Never    Social History   Socioeconomic History   Marital status: Single    Spouse name: Not on file   Number of children: Not on file   Years of education: Not on file   Highest education level: Not on file  Occupational History   Not on file  Tobacco Use   Smoking status: Every Day    Current packs/day: 0.50    Types: Cigarettes   Smokeless tobacco: Never  Vaping Use   Vaping status: Never Used  Substance and Sexual Activity   Alcohol use: No   Drug use: Never   Sexual activity: Not Currently  Other Topics Concern   Not on file  Social History Narrative   Not on file   Social Drivers of Health   Financial Resource Strain: Not on file  Food Insecurity: No Food Insecurity (04/27/2023)   Hunger Vital Sign    Worried About Running Out of Food in the Last Year: Never true    Ran Out of Food in the Last  Year: Never true  Transportation Needs: No Transportation Needs (04/27/2023)   PRAPARE - Administrator, Civil Service (Medical): No    Lack of Transportation (Non-Medical): No  Physical Activity: Not on file  Stress: Not on file  Social Connections: Not on file                           Sleep: Fair  Appetite:  Fair  Current Medications: Current Facility-Administered  Medications  Medication Dose Route Frequency Provider Last Rate Last Admin   acetaminophen  (TYLENOL ) tablet 650 mg  650 mg Oral Q6H PRN Motley-Mangrum, [name], PMHNP       alum & mag hydroxide-simeth (MAALOX/MYLANTA) 200-200-20 MG/5ML suspension 30 mL  30 mL Oral Q4H PRN Motley-Mangrum, [name], PMHNP   30 mL at 05/15/23 1351   amantadine  (SYMMETREL ) capsule 100 mg  100 mg Oral BID Victoria Ruts, MD   100 mg at 05/21/23 0856   amLODipine  (NORVASC ) tablet 5 mg  5 mg Oral Daily Yoshiharu Brassell, MD   5 mg at 05/21/23 0858   aspirin  EC tablet 81 mg  81 mg Oral Daily Shawonda Kerce, MD   81 mg at 05/21/23 0858   haloperidol  (HALDOL ) tablet 5 mg  5 mg Oral TID PRN Motley-Mangrum, [name], PMHNP   5 mg at 05/09/23 9166   And   diphenhydrAMINE  (BENADRYL ) capsule 50 mg  50 mg Oral TID PRN Motley-Mangrum, [name], PMHNP   50 mg at 05/09/23 0834   haloperidol  lactate (HALDOL ) injection 10 mg  10 mg Intramuscular TID PRN Motley-Mangrum, [name], PMHNP   10 mg at 05/19/23 1242   And   diphenhydrAMINE  (BENADRYL ) injection 50 mg  50 mg Intramuscular TID PRN Motley-Mangrum, [name], PMHNP       And   LORazepam  (ATIVAN ) injection 2 mg  2 mg Intramuscular TID PRN Motley-Mangrum, [name], PMHNP       haloperidol  lactate (HALDOL ) injection 5 mg  5 mg Intramuscular TID PRN Motley-Mangrum, [name], PMHNP       And   diphenhydrAMINE  (BENADRYL ) injection 50 mg  50 mg Intramuscular TID PRN Motley-Mangrum, [name], PMHNP   50 mg at 05/19/23 1240   And   LORazepam  (ATIVAN ) injection 2 mg  2 mg Intramuscular TID PRN Motley-Mangrum, [name], PMHNP   2 mg at 05/19/23 1240   divalproex  (DEPAKOTE  ER) 24 hr tablet 500 mg  500 mg Oral BID Motley-Mangrum, [name], PMHNP   500 mg at 05/21/23 0856   gabapentin  (NEURONTIN ) capsule 400 mg  400 mg Oral BID Motley-Mangrum, [name], PMHNP   400 mg at 05/21/23 0858   hydrOXYzine  (ATARAX ) tablet 25 mg  25 mg Oral TID PRN Motley-Mangrum, [name], PMHNP   25 mg at  05/17/23 2057   LORazepam  (ATIVAN ) tablet 1 mg  1 mg Oral Q8H PRN Motley-Mangrum, [name], PMHNP   1 mg at 05/09/23 9166   magnesium  hydroxide (MILK OF MAGNESIA) suspension 30 mL  30 mL Oral Daily PRN Motley-Mangrum, [name], PMHNP       OLANZapine  (ZYPREXA ) tablet 15 mg  15 mg Oral QHS Talecia Sherlin, MD   15 mg at 05/20/23 2116   traZODone  (DESYREL ) tablet 100 mg  100 mg Oral QHS PRN Motley-Mangrum, [name], PMHNP   100 mg at 05/20/23 2116   valbenazine  ([name] ) capsule 40 mg  40 mg Oral Daily Cam Charlie Loving, DO   40  mg at 05/21/23 9143    Lab Results:  No results found for this or any previous visit (from the past 48 hours).   Blood Alcohol level:  Lab Results  Component Value Date   ETH <10 04/25/2023   ETH <10 12/31/2022    Metabolic Disorder Labs: Lab Results  Component Value Date   HGBA1C 5.5 12/31/2022   MPG 111.15 12/31/2022   MPG 134.11 12/29/2021   No results found for: PROLACTIN Lab Results  Component Value Date   CHOL 133 12/31/2022   TRIG 39 12/31/2022   HDL 54 12/31/2022   CHOLHDL 2.5 12/31/2022   VLDL 8 12/31/2022   LDLCALC 71 12/31/2022   LDLCALC 69 12/01/2022     Musculoskeletal: Strength & Muscle Tone: within normal limits Gait & Station: normal Patient leans: N/A  Psychiatric Specialty Exam:  Presentation  General Appearance:  Appropriate for Environment  Eye Contact: Good  Speech: Spontaneous  Speech Volume: Normal  Handedness: Right   Mood and Affect  Mood: Fine  Affect: Stable and calm,   Thought Process  Thought Processes: Goal-directed, focused on going home  Descriptions of Associations: Intact  Orientation:Full (Time, Place and Person)  Thought Content: Improving  History of Schizophrenia/Schizoaffective disorder:Yes  Duration of Psychotic Symptoms:Greater than six months  Hallucinations: Denies  ideas of Reference:None  Suicidal Thoughts: Denies  Homicidal Thoughts:  Denies  Sensorium  Memory: Fair  Judgment: Improving  Insight: Improving   Executive Functions  Concentration: Fair  Attention Span: Fair   Language: Fair   Psychomotor Activity  Psychomotor Activity: Normal, has tremors in both upper extremities  Assets  Assets: Communication Skills; Desire for Improvement   Sleep  Sleep: Fair  Physical Exam: Physical Exam Constitutional:      Appearance: Normal appearance.  HENT:     Head: Normocephalic and atraumatic.     Nose: No congestion.  Eyes:     Pupils: Pupils are equal, round, and reactive to light.  Cardiovascular:     Rate and Rhythm: Regular rhythm.  Pulmonary:     Effort: Pulmonary effort is normal.  Skin:    General: Skin is warm.  Neurological:     General: No focal deficit present.     Mental Status: He is alert and oriented to person, place, and time.    Review of Systems  Constitutional:  Negative for chills and fever.  HENT:  Negative for hearing loss and tinnitus.   Eyes:  Negative for blurred vision.  Respiratory:  Negative for cough and shortness of breath.   Cardiovascular:  Negative for chest pain and palpitations.  Gastrointestinal:  Negative for nausea and vomiting.   Blood pressure (!) 135/90, pulse 65, temperature 97.8 F (36.6 C), temperature source Oral, resp. rate 16, height 5' 11 (1.803 m), weight 71.2 kg, SpO2 96%. Body mass index is 21.9 kg/m.   Treatment Plan Summary: Daily contact with patient to assess and evaluate symptoms and progress in treatment and Medication management  Continue on olanzapine  15 mg at bedtime,  Depakote  500 mg po BID Will continue on [name]  40 mg p.o. daily Continue Norvasc  5 mg by mouth daily  Valproic acid  level : 60, ALT/AST 10/16   Wenceslao Harries, MD

## 2023-05-21 NOTE — Plan of Care (Signed)
 Alert and oriented X3. Cooperative and compliant with scheduled bedtime medications. Denies pain, SI/HI and hallucinations. Safety checks performed every 15 mins.  Care, comfort, and safety maintained/ongoing.  Problem: Education: Goal: Knowledge of [place] [facility] Education information/materials will improve Outcome: Progressing Goal: Emotional status will improve Outcome: Progressing Goal: Mental status will improve Outcome: Progressing Goal: Verbalization of understanding the information provided will improve Outcome: Progressing   Problem: Education: Goal: Emotional status will improve Outcome: Progressing Goal: Mental status will improve Outcome: Progressing   Problem: Coping: Goal: Ability to verbalize frustrations and anger appropriately will improve Outcome: Progressing Goal: Ability to demonstrate self-control will improve Outcome: Progressing   Problem: Health Behavior/Discharge Planning: Goal: Identification of resources available to assist in meeting health care needs will improve Outcome: Progressing Goal: Compliance with treatment plan for underlying cause of condition will improve Outcome: Progressing   Problem: Physical Regulation: Goal: Ability to maintain clinical measurements within normal limits will improve Outcome: Progressing   Problem: Safety: Goal: Periods of time without injury will increase Outcome: Progressing   Problem: Education: Goal: Knowledge of General Education information will improve Description: Including pain rating scale, medication(s)/side effects and non-pharmacologic comfort measures Outcome: Progressing   Problem: Health Behavior/Discharge Planning: Goal: Ability to manage health-related needs will improve Outcome: Progressing   Problem: Clinical Measurements: Goal: Ability to maintain clinical measurements within normal limits will improve Outcome: Progressing Goal: Will remain free from infection Outcome:  Progressing Goal: Diagnostic test results will improve Outcome: Progressing Goal: Respiratory complications will improve Outcome: Progressing Goal: Cardiovascular complication will be avoided Outcome: Progressing   Problem: Activity: Goal: Risk for activity intolerance will decrease Outcome: Progressing   Problem: Nutrition: Goal: Adequate nutrition will be maintained Outcome: Progressing   Problem: Coping: Goal: Level of anxiety will decrease Outcome: Progressing   Problem: Elimination: Goal: Will not experience complications related to bowel motility Outcome: Progressing Goal: Will not experience complications related to urinary retention Outcome: Progressing   Problem: Pain Management: Goal: General experience of comfort will improve Outcome: Progressing   Problem: Safety: Goal: Ability to remain free from injury will improve Outcome: Progressing   Problem: Skin Integrity: Goal: Risk for impaired skin integrity will decrease Outcome: Progressing

## 2023-05-21 NOTE — Plan of Care (Signed)
 Patient isolates to his room most of the shift. Calm and cooperative on approach.Patient stated that he had a good day. Patient denies SI,HI and AVH. Appetite and energy level good. Compliant with medications. Support and encouragement given.

## 2023-05-21 NOTE — Group Note (Signed)
 Date:  05/21/2023 Time:  9:38 PM  Group Topic/Focus:  Making Healthy Choices:   The focus of this group is to help patients identify negative/unhealthy choices they were using prior to admission and identify positive/healthier coping strategies to replace them upon discharge.    Participation Level:  Active  Participation Quality:  Appropriate  Affect:  Appropriate  Cognitive:  Appropriate  Insight: Appropriate  Engagement in Group:  Engaged  Modes of Intervention:  Discussion Dwayne [name] 05/21/2023, 9:38 PM

## 2023-05-21 NOTE — Group Note (Signed)
 Date:  05/21/2023 Time:  9:06 PM  Group Topic/Focus:  Making Healthy Choices:   The focus of this group is to help patients identify negative/unhealthy choices they were using prior to admission and identify positive/healthier coping strategies to replace them upon discharge.    Participation Level:  Active  Participation Quality:  Appropriate  Affect:  Appropriate  Cognitive:  Appropriate  Insight: Appropriate  Engagement in Group:  Engaged  Modes of Intervention:  Discussion    Dwayne [name] 05/21/2023, 9:06 PM

## 2023-05-22 NOTE — Plan of Care (Signed)
  Problem: Education: Goal: Emotional status will improve Outcome: Progressing Goal: Mental status will improve Outcome: Progressing   Problem: Coping: Goal: Ability to verbalize frustrations and anger appropriately will improve Outcome: Progressing   Problem: Safety: Goal: Periods of time without injury will increase Outcome: Progressing

## 2023-05-22 NOTE — Plan of Care (Signed)
  Problem: Education: Goal: Knowledge of [facility] Education information/materials will improve Outcome: Progressing   Problem: Education: Goal: Emotional status will improve Outcome: Progressing   Problem: Education: Goal: Emotional status will improve Outcome: Progressing

## 2023-05-22 NOTE — Progress Notes (Signed)
   05/21/23 2000  Psych Admission Type (Psych Patients Only)  Admission Status Involuntary  Psychosocial Assessment  Patient Complaints Anxiety  Eye Contact Brief  Facial Expression Flat  Affect Preoccupied  Speech Soft  Interaction Assertive  Motor Activity Tremors  Appearance/Hygiene Unremarkable  Behavior Characteristics Cooperative  Mood Pleasant  Aggressive Behavior  Effect No apparent injury  Thought Process  Coherency WDL  Content Preoccupation  Delusions None reported or observed  Perception WDL  Hallucination None reported or observed  Judgment Poor  Confusion Mild  Danger to Self  Current suicidal ideation? Denies  Danger to Others  Danger to Others None reported or observed   No distress noted, he denies SI/HI/AVH will continue to monitor.

## 2023-05-22 NOTE — BHH Counselor (Signed)
 CSW faxed additional paperwork to [facility].  CSW received confirmation fax was successful.  Penni Homans, MSW, LCSW 05/22/2023 4:21 PM

## 2023-05-22 NOTE — Group Note (Signed)
 Date:  05/22/2023 Time:  10:40 AM  Group Topic/Focus:  Dimensions of Wellness:   The focus of this group is to introduce the topic of wellness and discuss the role each dimension of wellness plays in total health. Identifying Needs:   The focus of this group is to help patients identify their personal needs that have been historically problematic and identify healthy behaviors to address their needs.    Participation Level:  Active  Participation Quality:  Appropriate and Attentive  Affect:  Appropriate  Cognitive:  Alert, Appropriate, and Oriented  Insight: Appropriate  Engagement in Group:  Developing/Improving and Engaged  Modes of Intervention:  Activity, Discussion, and Education  Additional Comments:    Lurlean Niece 05/22/2023, 10:40 AM

## 2023-05-22 NOTE — Group Note (Signed)
 Recreation Therapy Group Note   Group Topic:Healthy Support Systems  Group Date: 05/22/2023 Start Time: 1010 End Time: 1110 Facilitators: Celestia Jeoffrey BRAVO, LRT, CTRS Location:  Craft Room  Group Description: Straw Bridge. In groups or individually, patients were given 10 plastic drinking straws and an equal length of masking tape. Using the materials provided, patients were instructed to build a free-standing bridge-like structure to suspend an everyday item (ex: deck of cards) off the floor or table surface. All materials were required to be used in secondary school teacher. LRT facilitated post-activity discussion reviewing the importance of having strong and healthy support systems in our lives. LRT discussed how the people in our lives serve as the tape and the deck of cards we placed on top of our straw structure are the stressors we face in daily life. LRT and pts discussed what happens in our life when things get too heavy for us , and we don't have strong supports outside of the hospital. Pt shared 2 of their healthy supports in their life aloud in the group.   Goal Area(s) Addressed:  Patient will identify 2 healthy supports in their life. Patient will identify skills to successfully complete activity. Patient will identify correlation of this activity to life post-discharge.  Patient will build on frustration tolerance skills. Patient will increase team building and communication skills   Affect/Mood: N/A   Participation Level: Did not attend    Clinical Observations/Individualized Feedback: Patient did not attend group.  Plan: Continue to engage patient in RT group sessions 2-3x/week.   Jeoffrey BRAVO Celestia, LRT, CTRS 05/22/2023 12:01 PM

## 2023-05-22 NOTE — Group Note (Signed)
 [facility] LCSW Group Therapy Note    Group Date: 05/22/2023 Start Time: 1330 End Time: 1430  Type of Therapy and Topic:  Group Therapy:  Overcoming Obstacles  Participation Level:  [facility] PARTICIPATION LEVEL: Did Not Attend  Mood:  Description of Group:   In this group patients will be encouraged to explore what they see as obstacles to their own wellness and recovery. They will be guided to discuss their thoughts, feelings, and behaviors related to these obstacles. The group will process together ways to cope with barriers, with attention given to specific choices patients can make. Each patient will be challenged to identify changes they are motivated to make in order to overcome their obstacles. This group will be process-oriented, with patients participating in exploration of their own experiences as well as giving and receiving support and challenge from other group members.  Therapeutic Goals: 1. Patient will identify personal and current obstacles as they relate to admission. 2. Patient will identify barriers that currently interfere with their wellness or overcoming obstacles.  3. Patient will identify feelings, thought process and behaviors related to these barriers. 4. Patient will identify two changes they are willing to make to overcome these obstacles:    Summary of Patient Progress Patient declined to attend group.  Therapeutic Modalities:   Cognitive Behavioral Therapy Solution Focused Therapy Motivational Interviewing Relapse Prevention Therapy   Sherryle JINNY Margo, LCSW

## 2023-05-22 NOTE — Plan of Care (Signed)
 Patient up in the milieu for meals. Patient's affect brightens on approach. Denies SI,HI and AVH. Compliant with medications. No issues verbalized. Appetite and energy level good. ADLs maintained. Support and encouragement given.

## 2023-05-22 NOTE — Group Note (Signed)
 Recreation Therapy Group Note   Group Topic:Emotion Expression  Group Date: 05/22/2023 Start Time: 1530 End Time: 1615 Facilitators: Celestia Jeoffrey FORBES ARTICE, CTRS Location:  Craft Room  Group Description: Painting a Peaceful Place. Patients and LRT discuss what it means to be "at peace", what it feels like physically and mentally. Pts are given a canvas and watercolor paint to use and encouraged to draw their idea of a peaceful place. Pts and LRT discuss how they use this in their daily life post discharge. Pts are encouraged to take their canvas home with them as a reminder to find their peaceful place whenever they are feeling depressed, anxious, etc.    Goal Area(s) Addressed:  Patient will identify what it means to experience a "peaceful" emotion. Patient will identify a new coping skill.  Patient will express their emotions through art. Patients will increase communication by talking with LRT and peers while in group.   Affect/Mood: N/A   Participation Level: Did not attend    Clinical Observations/Individualized Feedback: Patient did not attend group.   Plan: Continue to engage patient in RT group sessions 2-3x/week.   Jeoffrey FORBES Celestia, LRT, CTRS 05/22/2023 5:32 PM

## 2023-05-22 NOTE — Progress Notes (Signed)
 [facility] MD Progress Note  05/22/2023 2:18 PM Mark [name]  MRN:  [id number]  Mark [name] is a 63 year old African-American male who is involuntarily admitted to psychiatry for aggressive behavior towards his parents. He lives with mom and dad in [place]. He has a long history of schizophrenia.   Subjective:  Case discussed in multidisciplinary meeting, chart reviewed. Patient is adherent with treatment recommendations. Patient seen during rounds today. He reports he is doing good and notes improvement since restarting medications upon admission. He denies thoughts of harming self and others. He denies perceptual disturbances. Adequate appetite and sleep. Discharge planning in process. SW reports [employer] care coordinator was contacted for housing assistance as patient is unable to return home with parents in [place].  Principal Problem: Paranoid schizophrenia (HCC) Diagnosis: Principal Problem:   Paranoid schizophrenia (HCC)  Total Time spent with patient: 20 minutes  Past Psychiatric History: He has a long history of schizophrenia.  Past admission in February 2023, patient was discharged on Depakote  500 mg twice daily, Cogentin  1 mg twice daily, Haldol  5 mg twice daily and Neurontin  400 mg 3 times daily also clozapine  was discontinued during that hospitalization. Past Psychotropic medications included clozapine , Depakote , Haldol , Cogentin , Inderal , trazodone  and Vistaril .   Past Medical History:  Past Medical History:  Diagnosis Date   COPD (chronic obstructive pulmonary disease) (HCC)    Hypertension    Schizophrenia (HCC)     Past Surgical History:  Procedure Laterality Date   LOWER EXTREMITY ANGIOGRAPHY N/A 09/18/2017   Procedure: LOWER EXTREMITY ANGIOGRAPHY- Recheck lysis;  Surgeon: Sheree Penne Bruckner, MD;  Location: [facility] INVASIVE CV LAB;  Service: Cardiovascular;  Laterality: N/A;   PERIPHERAL VASCULAR INTERVENTION Left 09/18/2017   Procedure: PERIPHERAL VASCULAR  INTERVENTION;  Surgeon: Sheree Penne Bruckner, MD;  Location: [facility] INVASIVE CV LAB;  Service: Cardiovascular;  Laterality: Left;   THROMBECTOMY FEMORAL ARTERY Left 09/17/2017   Procedure: POSSIBLE THROMBECTOMY;  Surgeon: Sheree Penne Bruckner, MD;  Location: [facility] OR;  Service: Vascular;  Laterality: Left;   VENOGRAM Left 09/17/2017   Procedure: ULTRASOUND POPLITEAL ACCESS; CENTRAL VENOGRAM, IVVS, LYSIS CATHETER PLACEMENT;  Surgeon: Sheree Penne Bruckner, MD;  Location: [facility] OR;  Service: Vascular;  Laterality: Left;   Family History: History reviewed. No pertinent family history.  Social History:  Social History   Substance and Sexual Activity  Alcohol Use No     Social History   Substance and Sexual Activity  Drug Use Never    Social History   Socioeconomic History   Marital status: Single    Spouse name: Not on file   Number of children: Not on file   Years of education: Not on file   Highest education level: Not on file  Occupational History   Not on file  Tobacco Use   Smoking status: Every Day    Current packs/day: 0.50    Types: Cigarettes   Smokeless tobacco: Never  Vaping Use   Vaping status: Never Used  Substance and Sexual Activity   Alcohol use: No   Drug use: Never   Sexual activity: Not Currently  Other Topics Concern   Not on file  Social History Narrative   Not on file   Social Drivers of Health   Financial Resource Strain: Not on file  Food Insecurity: No Food Insecurity (04/27/2023)   Hunger Vital Sign    Worried About Running Out of Food in the Last Year: Never true    Ran Out of Food in the Last Year: Never true  Transportation Needs: No Transportation Needs (04/27/2023)   PRAPARE - Administrator, Civil Service (Medical): No    Lack of Transportation (Non-Medical): No  Physical Activity: Not on file  Stress: Not on file  Social Connections: Not on file   Sleep: Good  Appetite:  Good  Current Medications: Current  Facility-Administered Medications  Medication Dose Route Frequency Provider Last Rate Last Admin   acetaminophen  (TYLENOL ) tablet 650 mg  650 mg Oral Q6H PRN Motley-Mangrum, [name], PMHNP       alum & mag hydroxide-simeth (MAALOX/MYLANTA) 200-200-20 MG/5ML suspension 30 mL  30 mL Oral Q4H PRN Motley-Mangrum, [name], PMHNP   30 mL at 05/15/23 1351   amantadine  (SYMMETREL ) capsule 100 mg  100 mg Oral BID Victoria Ruts, MD   100 mg at 05/22/23 0935   amLODipine  (NORVASC ) tablet 5 mg  5 mg Oral Daily Parmar, Meenakshi, MD   5 mg at 05/22/23 0935   aspirin  EC tablet 81 mg  81 mg Oral Daily Parmar, Meenakshi, MD   81 mg at 05/22/23 0935   haloperidol  (HALDOL ) tablet 5 mg  5 mg Oral TID PRN Motley-Mangrum, [name], PMHNP   5 mg at 05/09/23 9166   And   diphenhydrAMINE  (BENADRYL ) capsule 50 mg  50 mg Oral TID PRN Motley-Mangrum, [name], PMHNP   50 mg at 05/09/23 0834   haloperidol  lactate (HALDOL ) injection 10 mg  10 mg Intramuscular TID PRN Motley-Mangrum, [name], PMHNP   10 mg at 05/19/23 1242   And   diphenhydrAMINE  (BENADRYL ) injection 50 mg  50 mg Intramuscular TID PRN Motley-Mangrum, [name], PMHNP       And   LORazepam  (ATIVAN ) injection 2 mg  2 mg Intramuscular TID PRN Motley-Mangrum, [name], PMHNP       haloperidol  lactate (HALDOL ) injection 5 mg  5 mg Intramuscular TID PRN Motley-Mangrum, [name], PMHNP       And   diphenhydrAMINE  (BENADRYL ) injection 50 mg  50 mg Intramuscular TID PRN Motley-Mangrum, [name], PMHNP   50 mg at 05/19/23 1240   And   LORazepam  (ATIVAN ) injection 2 mg  2 mg Intramuscular TID PRN Motley-Mangrum, [name], PMHNP   2 mg at 05/19/23 1240   divalproex  (DEPAKOTE  ER) 24 hr tablet 500 mg  500 mg Oral BID Motley-Mangrum, [name], PMHNP   500 mg at 05/22/23 0900   gabapentin  (NEURONTIN ) capsule 400 mg  400 mg Oral BID Motley-Mangrum, [name], PMHNP   400 mg at 05/22/23 0935   hydrOXYzine  (ATARAX ) tablet 25 mg  25 mg Oral TID PRN Motley-Mangrum, Jadeka  A, PMHNP   25 mg at 05/21/23 2118   LORazepam  (ATIVAN ) tablet 1 mg  1 mg Oral Q8H PRN Motley-Mangrum, [name], PMHNP   1 mg at 05/09/23 9166   magnesium  hydroxide (MILK OF MAGNESIA) suspension 30 mL  30 mL Oral Daily PRN Motley-Mangrum, [name], PMHNP       OLANZapine  (ZYPREXA ) tablet 15 mg  15 mg Oral QHS Parmar, Meenakshi, MD   15 mg at 05/21/23 2117   traZODone  (DESYREL ) tablet 100 mg  100 mg Oral QHS PRN Motley-Mangrum, [name], PMHNP   100 mg at 05/21/23 2118   valbenazine  ([name] ) capsule 40 mg  40 mg Oral Daily Cam Charlie Loving, DO   40 mg at 05/22/23 0935    Lab Results: No results found for this or any previous visit (from the past 48 hours).  Blood Alcohol level:  Lab Results  Component Value Date   ETH <10 04/25/2023   ETH <10 12/31/2022    Metabolic Disorder Labs: Lab Results  Component Value Date   HGBA1C 5.5 12/31/2022   MPG 111.15 12/31/2022   MPG 134.11 12/29/2021   No results found for: PROLACTIN Lab Results  Component Value Date   CHOL 133 12/31/2022   TRIG 39 12/31/2022   HDL 54 12/31/2022   CHOLHDL 2.5 12/31/2022   VLDL 8 12/31/2022   LDLCALC 71 12/31/2022   LDLCALC 69 12/01/2022    Musculoskeletal: Strength & Muscle Tone: within normal limits Gait & Station: normal Patient leans: N/A  Psychiatric Specialty Exam:  Presentation  General Appearance:  Appropriate for Environment  Eye Contact: Good  Speech: Clear and Coherent  Speech Volume: Normal  Handedness: Right   Mood and Affect  Mood: Dysphoric  Affect: Constricted   Thought Process  Thought Processes: Coherent  Descriptions of Associations:Intact  Orientation:Full (Time, Place and Person)  Thought Content:Logical  History of Schizophrenia/Schizoaffective disorder:Yes  Duration of Psychotic Symptoms:Greater than six months  Hallucinations:Hallucinations: None  Ideas of Reference:None  Suicidal Thoughts:Suicidal Thoughts: No  Homicidal  Thoughts:Homicidal Thoughts: No   Sensorium  Memory: Immediate Fair; Remote Fair; Recent Fair  Judgment: Fair  Insight: Fair   Art Therapist  Concentration: Fair  Attention Span: Fair  Recall: [employer] of Knowledge: Fair  Language: Fair   Psychomotor Activity  Psychomotor Activity:Psychomotor Activity: Tremor   Assets  Assets: Manufacturing Systems Engineer; Desire for Improvement   Sleep  Sleep:Sleep: Good   Physical Exam Vitals and nursing note reviewed.  Constitutional:      Appearance: Normal appearance.  HENT:     Head: Normocephalic and atraumatic.     Nose: Nose normal. No congestion or rhinorrhea.     Mouth/Throat:     Mouth: Mucous membranes are moist.  Eyes:     Extraocular Movements: Extraocular movements intact.     Conjunctiva/sclera: Conjunctivae normal.  Pulmonary:     Effort: Pulmonary effort is normal.  Abdominal:     General: There is no distension.     Tenderness: There is no guarding.  Musculoskeletal:        General: Normal range of motion.     Cervical back: Normal range of motion.  Skin:    General: Skin is warm and dry.  Neurological:     Mental Status: He is alert and oriented to person, place, and time.  Psychiatric:        Mood and Affect: Mood normal.    Review of Systems  Constitutional: Negative.   HENT: Negative.    Eyes: Negative.   Respiratory: Negative.    Cardiovascular: Negative.   Gastrointestinal: Negative.   Genitourinary: Negative.   Musculoskeletal: Negative.   Skin: Negative.   Neurological: Negative.   Endo/Heme/Allergies: Negative.   All other systems reviewed and are negative.  Blood pressure 135/73, pulse (!) 57, temperature 98 F (36.7 C), resp. rate 20, height 5' 11 (1.803 m), weight 71.2 kg, SpO2 98%. Body mass index is 21.9 kg/m.   Treatment Plan Summary: Daily contact with patient to assess and evaluate symptoms and progress in treatment and Medication management  Continue  olanzapine  15 mg at bedtime for psychosis Continue Depakote  500 mg po BID for mood Continue [name]  40 mg p.o. daily for TD Continue Norvasc  5 mg by mouth daily for HTN   Valproic acid  level : 60, ALT/AST 10/16   Raydell Maners E Journiee Feldkamp, NP 05/22/2023, 2:18 PM

## 2023-05-22 NOTE — Progress Notes (Signed)
 Patient continues to be preoccupied, pacing halls, but pleasant. Denies SI, HI, AVH. Medication compliant. Requests meds for sleep and anxiety. Given with good relief. Minimal interaction with staff and peers. Presents flat but brightens on approach.  Encouragement and support provided. Safety checks maintained. Medications given as prescribed. Pt receptive and remains safe on unit with q 15 min checks.

## 2023-05-22 NOTE — BHH Counselor (Signed)
 CSW spoke with [facility] with [facility].  CSW informed that pt remains at the hospital at this time.    Britta Mccreedy reports that patient remains on the waitlist.  Penni Homans, MSW, LCSW 05/22/2023 11:51 AM

## 2023-05-23 NOTE — Progress Notes (Signed)
   05/23/23 1700  Psych Admission Type (Psych Patients Only)  Admission Status Involuntary  Psychosocial Assessment  Patient Complaints Sleep disturbance (Patient was asking for something to help sleep during morning medication pass.)  Eye Contact Fair  Facial Expression Other (Comment) (appropriate)  Affect Appropriate to circumstance  Speech Logical/coherent  Interaction Assertive  Motor Activity Fidgety;Tremors  Appearance/Hygiene In scrubs  Behavior Characteristics Cooperative;Appropriate to situation  Mood Pleasant  Aggressive Behavior  Effect No apparent injury  Thought Process  Coherency WDL  Content WDL  Delusions None reported or observed  Perception WDL  Hallucination None reported or observed  Judgment WDL  Confusion None  Danger to Self  Current suicidal ideation? Denies  Self-Injurious Behavior No self-injurious ideation or behavior indicators observed or expressed   Agreement Not to Harm Self Yes  Description of Agreement Verbal  Danger to Others  Danger to Others None reported or observed

## 2023-05-23 NOTE — Group Note (Signed)
 Recreation Therapy Group Note   Group Topic:Relaxation  Group Date: 05/23/2023 Start Time: 1000 End Time: 1050 Facilitators: Celestia Jeoffrey BRAVO, LRT, CTRS Location:  Craft Room  Group Description: PMR (Progressive Muscle Relaxation). LRT asks patients their current level of stress/anxiety from 1-10, with 10 being the highest. LRT educates patients on what PMR is and the benefits that come from it. Patients are asked to sit with their feet flat on the floor while sitting up and all the way back in their chair, if possible. LRT and pts follow a prompt through a speaker that requires you to tense and release different muscles in their body and focus on their breathing. During session, lights are off and soft music is being played. Pts are given a stress ball to use if needed. At the end of the prompt, LRT asks patients to rank their current levels of stress/anxiety from 1-10, 10 being the highest. LRT provides patients with an education handout on PMR.   Goal Area(s) Addressed:  Patients will be able to describe progressive muscle relaxation.  Patient will practice using relaxation technique. Patient will identify a new coping skill.  Patient will follow multistep directions to reduce anxiety and stress.   Affect/Mood: N/A   Participation Level: Did not attend    Clinical Observations/Individualized Feedback: Patient did not attend group.   Plan: Continue to engage patient in RT group sessions 2-3x/week.   Jeoffrey BRAVO Celestia, LRT, CTRS 05/23/2023 11:32 AM

## 2023-05-23 NOTE — Group Note (Signed)
 LCSW Group Therapy Note  Group Date: 05/23/2023 Start Time: 1300 End Time: 1405   Type of Therapy and Topic:  Group Therapy - Healthy vs Unhealthy Coping Skills  Participation Level:  Did Not Attend   Description of Group The focus of this group was to determine what unhealthy coping techniques typically are used by group members and what healthy coping techniques would be helpful in coping with various problems. Patients were guided in becoming aware of the differences between healthy and unhealthy coping techniques. Patients were asked to identify 2-3 healthy coping skills they would like to learn to use more effectively.  Therapeutic Goals Patients learned that coping is what human beings do all day long to deal with various situations in their lives Patients defined and discussed healthy vs unhealthy coping techniques Patients identified their preferred coping techniques and identified whether these were healthy or unhealthy Patients determined 2-3 healthy coping skills they would like to become more familiar with and use more often. Patients provided support and ideas to each other   Summary of Patient Progress: Patient did not attend group.    Therapeutic Modalities Cognitive Behavioral Therapy Motivational Interviewing  Alveta CHRISTELLA Kerns, LCSWA 05/23/2023  3:01 PM

## 2023-05-23 NOTE — BH IP Treatment Plan (Signed)
 Interdisciplinary Treatment and Diagnostic Plan Update  05/23/2023 Time of Session: 08:30 Mark [name] MRN: [id number]  Principal Diagnosis: Paranoid schizophrenia [facility])  Secondary Diagnoses: Principal Problem:   Paranoid schizophrenia (HCC)   Current Medications:  Current Facility-Administered Medications  Medication Dose Route Frequency Provider Last Rate Last Admin   acetaminophen  (TYLENOL ) tablet 650 mg  650 mg Oral Q6H PRN Motley-Mangrum, [name], PMHNP       alum & mag hydroxide-simeth (MAALOX/MYLANTA) 200-200-20 MG/5ML suspension 30 mL  30 mL Oral Q4H PRN Motley-Mangrum, [name], PMHNP   30 mL at 05/15/23 1351   amantadine  (SYMMETREL ) capsule 100 mg  100 mg Oral BID Victoria Ruts, MD   100 mg at 05/23/23 0856   amLODipine  (NORVASC ) tablet 5 mg  5 mg Oral Daily Parmar, Meenakshi, MD   5 mg at 05/23/23 0858   aspirin  EC tablet 81 mg  81 mg Oral Daily Parmar, Meenakshi, MD   81 mg at 05/23/23 0858   haloperidol  (HALDOL ) tablet 5 mg  5 mg Oral TID PRN Motley-Mangrum, [name], PMHNP   5 mg at 05/09/23 9166   And   diphenhydrAMINE  (BENADRYL ) capsule 50 mg  50 mg Oral TID PRN Motley-Mangrum, [name], PMHNP   50 mg at 05/09/23 0834   haloperidol  lactate (HALDOL ) injection 10 mg  10 mg Intramuscular TID PRN Motley-Mangrum, [name], PMHNP   10 mg at 05/19/23 1242   And   diphenhydrAMINE  (BENADRYL ) injection 50 mg  50 mg Intramuscular TID PRN Motley-Mangrum, [name], PMHNP       And   LORazepam  (ATIVAN ) injection 2 mg  2 mg Intramuscular TID PRN Motley-Mangrum, [name], PMHNP       haloperidol  lactate (HALDOL ) injection 5 mg  5 mg Intramuscular TID PRN Motley-Mangrum, [name], PMHNP       And   diphenhydrAMINE  (BENADRYL ) injection 50 mg  50 mg Intramuscular TID PRN Motley-Mangrum, [name], PMHNP   50 mg at 05/19/23 1240   And   LORazepam  (ATIVAN ) injection 2 mg  2 mg Intramuscular TID PRN Motley-Mangrum, [name], PMHNP   2 mg at 05/19/23 1240   divalproex  (DEPAKOTE  ER)  24 hr tablet 500 mg  500 mg Oral BID Motley-Mangrum, [name], PMHNP   500 mg at 05/23/23 0856   gabapentin  (NEURONTIN ) capsule 400 mg  400 mg Oral BID Motley-Mangrum, [name], PMHNP   400 mg at 05/23/23 0858   hydrOXYzine  (ATARAX ) tablet 25 mg  25 mg Oral TID PRN Motley-Mangrum, [name], PMHNP   25 mg at 05/22/23 2059   LORazepam  (ATIVAN ) tablet 1 mg  1 mg Oral Q8H PRN Motley-Mangrum, [name], PMHNP   1 mg at 05/09/23 9166   magnesium  hydroxide (MILK OF MAGNESIA) suspension 30 mL  30 mL Oral Daily PRN Motley-Mangrum, [name], PMHNP       OLANZapine  (ZYPREXA ) tablet 15 mg  15 mg Oral QHS Parmar, Meenakshi, MD   15 mg at 05/22/23 2059   traZODone  (DESYREL ) tablet 100 mg  100 mg Oral QHS PRN Motley-Mangrum, [name], PMHNP   100 mg at 05/22/23 2059   valbenazine  ([name] ) capsule 40 mg  40 mg Oral Daily Cam Charlie Loving, DO   40 mg at 05/23/23 9143   PTA Medications: Medications Prior to Admission  Medication Sig Dispense Refill Last Dose/Taking   amantadine  (SYMMETREL ) 100 MG capsule Take 100 mg by mouth 2 (two) times daily.      amLODipine  (NORVASC ) 5 MG tablet Take  1 tablet (5 mg total) by mouth daily.      aspirin  EC 81 MG tablet Take 81 mg by mouth daily. Swallow whole.      benztropine  (COGENTIN ) 1 MG tablet Take 1 tablet (1 mg total) by mouth 2 (two) times daily. 60 tablet 0    cyclobenzaprine  (FLEXERIL ) 10 MG tablet Take 10 mg by mouth 2 (two) times daily as needed.      divalproex  (DEPAKOTE  ER) 500 MG 24 hr tablet Take 1 tablet (500 mg total) by mouth 2 (two) times daily. (Patient not taking: Reported on 04/25/2023) 60 tablet 0    gabapentin  (NEURONTIN ) 300 MG capsule Take 300 mg by mouth 3 (three) times daily. (Patient not taking: Reported on 04/25/2023)      gabapentin  (NEURONTIN ) 400 MG capsule Take 1 capsule (400 mg total) by mouth 3 (three) times daily. (Patient not taking: Reported on 04/25/2023) 90 capsule 0    haloperidol  (HALDOL ) 10 MG tablet Take 1 tablet (10 mg  total) by mouth 2 (two) times daily. (Patient not taking: Reported on 04/25/2023)      traZODone  (DESYREL ) 150 MG tablet Take 1 tablet (150 mg total) by mouth at bedtime. (Patient not taking: Reported on 04/25/2023) 30 tablet 0     Patient Stressors: Marital or family conflict   Medication change or noncompliance    Patient Strengths: Supportive family/friends   Treatment Modalities: Medication Management, Group therapy, Case management,  1 to 1 session with clinician, Psychoeducation, Recreational therapy.   Physician Treatment Plan for Primary Diagnosis: Paranoid schizophrenia (HCC) Long Term Goal(s): Improvement in symptoms so as ready for discharge   Short Term Goals: Ability to identify changes in lifestyle to reduce recurrence of condition will improve Ability to verbalize feelings will improve Ability to disclose and discuss suicidal ideas Ability to demonstrate self-control will improve Ability to identify and develop effective coping behaviors will improve Ability to maintain clinical measurements within normal limits will improve Compliance with prescribed medications will improve Ability to identify triggers associated with substance abuse/mental health issues will improve  Medication Management: Evaluate patient's response, side effects, and tolerance of medication regimen.  Therapeutic Interventions: 1 to 1 sessions, Unit Group sessions and Medication administration.  Evaluation of Outcomes: Progressing  Physician Treatment Plan for Secondary Diagnosis: Principal Problem:   Paranoid schizophrenia (HCC)  Long Term Goal(s): Improvement in symptoms so as ready for discharge   Short Term Goals: Ability to identify changes in lifestyle to reduce recurrence of condition will improve Ability to verbalize feelings will improve Ability to disclose and discuss suicidal ideas Ability to demonstrate self-control will improve Ability to identify and develop effective coping  behaviors will improve Ability to maintain clinical measurements within normal limits will improve Compliance with prescribed medications will improve Ability to identify triggers associated with substance abuse/mental health issues will improve     Medication Management: Evaluate patient's response, side effects, and tolerance of medication regimen.  Therapeutic Interventions: 1 to 1 sessions, Unit Group sessions and Medication administration.  Evaluation of Outcomes: Progressing   RN Treatment Plan for Primary Diagnosis: Paranoid schizophrenia (HCC) Long Term Goal(s): Knowledge of disease and therapeutic regimen to maintain health will improve  Short Term Goals: Ability to remain free from injury will improve, Ability to verbalize frustration and anger appropriately will improve, Ability to demonstrate self-control, Ability to participate in decision making will improve, Ability to verbalize feelings will improve, Ability to disclose and discuss suicidal ideas, Ability to identify and develop effective coping behaviors  will improve, and Compliance with prescribed medications will improve  Medication Management: RN will administer medications as ordered by provider, will assess and evaluate patient's response and provide education to patient for prescribed medication. RN will report any adverse and/or side effects to prescribing provider.  Therapeutic Interventions: 1 on 1 counseling sessions, Psychoeducation, Medication administration, Evaluate responses to treatment, Monitor vital signs and CBGs as ordered, Perform/monitor CIWA, COWS, AIMS and Fall Risk screenings as ordered, Perform wound care treatments as ordered.  Evaluation of Outcomes: Progressing   LCSW Treatment Plan for Primary Diagnosis: Paranoid schizophrenia (HCC) Long Term Goal(s): Safe transition to appropriate next level of care at discharge, Engage patient in therapeutic group addressing interpersonal concerns.  Short  Term Goals: Engage patient in aftercare planning with referrals and resources, Increase social support, Increase ability to appropriately verbalize feelings, Increase emotional regulation, Facilitate acceptance of mental health diagnosis and concerns, Facilitate patient progression through stages of change regarding substance use diagnoses and concerns, Identify triggers associated with mental health/substance abuse issues, and Increase skills for wellness and recovery  Therapeutic Interventions: Assess for all discharge needs, 1 to 1 time with Social worker, Explore available resources and support systems, Assess for adequacy in community support network, Educate family and significant other(s) on suicide prevention, Complete Psychosocial Assessment, Interpersonal group therapy.  Evaluation of Outcomes: Progressing   Progress in Treatment: Attending groups: Yes. Participating in groups: Yes. Taking medication as prescribed: Yes. Toleration medication: Yes. Family/Significant other contact made: Yes, individual(s) contacted:  Antero Derosia [phone]) Patient understands diagnosis: Yes. Discussing patient identified problems/goals with staff: Yes. Medical problems stabilized or resolved: Yes. Denies suicidal/homicidal ideation: Yes. Issues/concerns per patient self-inventory: No. Other: None   New problem(s) identified: No, Describe:  None identified Update 05/03/23: No changes at this time. Update 05/08/23: No changes at this time. Update 05/13/2023: No changes at this time. Update 05/18/2023: No changes at this time. Update 05/23/23: No changes at this time.    New Short Term/Long Term Goal(s):  elimination of symptoms of psychosis, medication management for mood stabilization; elimination of SI thoughts; development of comprehensive mental wellness plan. 05/03/23 Update: No changes at this time. Update 05/08/23: No changes at this time.   Update 05/13/2023: No changes at this  time. Update 05/18/2023: No changes at this time. Update 05/23/23: No changes at this time.   Patient Goals:  To stay out of trouble and move back home 05/03/23 Update: No changes at this time. Update 05/08/23: No changes at this time.   Update 05/13/2023: No changes at this time. Update 05/18/2023: No changes at this time. Update 05/23/23: No changes at this time.    Discharge Plan or Barriers: CSW will assist with appropriate discharge planning 05/03/23 Update: No changes at this time. Update 05/08/23: Pt's guardian reports that he cannot come back home due to restraining order. CSW will discuss with leadership next steps for safe discharge planning. Update 05/13/2023: No changes at this time. Update 05/18/2023: Pt awaiting care coordinator through [facility] to assist with placement. Update 05/23/23: No changes at this time.   Reason for Continuation of Hospitalization: Aggression Medication stabilization   Estimated Length of Stay: 1 to 7 days 05/03/23 Update: No changes at this time. Update 05/08/23: TBD  Update 05/13/2023: TBD Update 05/18/2023: TBD. Update 05/23/23: No changes at this time.   Last 3 [place] Suicide Severity Risk Score: Flowsheet Row Admission (Current) from 04/27/2023 in [facility] ED from 12/31/2022 in [facility] ED from 12/29/2022  in [facility] Emergency Department at [facility]  C-SSRS RISK CATEGORY No Risk No Risk No Risk       Last PHQ 2/9 Scores:     No data to display          Scribe for Treatment Team: Nadara JONELLE Fam, LCSW 05/23/2023 1:17 PM

## 2023-05-23 NOTE — Group Note (Signed)
 Date:  05/23/2023 Time:  8:50 PM  Group Topic/Focus:  Wrap-Up Group:   The focus of this group is to help patients review their daily goal of treatment and discuss progress on daily workbooks.    Participation Level:  Minimal  Participation Quality:  Appropriate and Attentive  Affect:  Appropriate and Flat  Cognitive:  Alert and Oriented  Insight: Appropriate and Good  Engagement in Group:  Limited  Modes of Intervention:  Education and Orientation  Additional Comments:     Maglione,Avianna Moynahan E 05/23/2023, 8:50 PM

## 2023-05-23 NOTE — Plan of Care (Signed)
  Problem: Education: Goal: Knowledge of [facility] Education information/materials will improve Outcome: Progressing Goal: Emotional status will improve Outcome: Progressing Goal: Mental status will improve Outcome: Progressing Goal: Verbalization of understanding the information provided will improve Outcome: Progressing   Problem: Education: Goal: Emotional status will improve Outcome: Progressing Goal: Mental status will improve Outcome: Progressing   Problem: Coping: Goal: Ability to verbalize frustrations and anger appropriately will improve Outcome: Progressing Goal: Ability to demonstrate self-control will improve Outcome: Progressing   Problem: Health Behavior/Discharge Planning: Goal: Identification of resources available to assist in meeting health care needs will improve Outcome: Progressing Goal: Compliance with treatment plan for underlying cause of condition will improve Outcome: Progressing   Problem: Physical Regulation: Goal: Ability to maintain clinical measurements within normal limits will improve Outcome: Progressing   Problem: Safety: Goal: Periods of time without injury will increase Outcome: Progressing   Problem: Education: Goal: Knowledge of General Education information will improve Description: Including pain rating scale, medication(s)/side effects and non-pharmacologic comfort measures Outcome: Progressing   Problem: Health Behavior/Discharge Planning: Goal: Ability to manage health-related needs will improve Outcome: Progressing   Problem: Clinical Measurements: Goal: Ability to maintain clinical measurements within normal limits will improve Outcome: Progressing Goal: Will remain free from infection Outcome: Progressing Goal: Diagnostic test results will improve Outcome: Progressing Goal: Respiratory complications will improve Outcome: Progressing Goal: Cardiovascular complication will be avoided Outcome: Progressing    Problem: Activity: Goal: Risk for activity intolerance will decrease Outcome: Progressing   Problem: Nutrition: Goal: Adequate nutrition will be maintained Outcome: Progressing   Problem: Coping: Goal: Level of anxiety will decrease Outcome: Progressing   Problem: Elimination: Goal: Will not experience complications related to bowel motility Outcome: Progressing Goal: Will not experience complications related to urinary retention Outcome: Progressing   Problem: Pain Management: Goal: General experience of comfort will improve Outcome: Progressing   Problem: Safety: Goal: Ability to remain free from injury will improve Outcome: Progressing   Problem: Skin Integrity: Goal: Risk for impaired skin integrity will decrease Outcome: Progressing

## 2023-05-23 NOTE — Group Note (Signed)
 Recreation Therapy Group Note   Group Topic:Health and Wellness  Group Date: 05/23/2023 Start Time: 1530 End Time: 1600 Facilitators: Celestia Jeoffrey BRAVO, LRT, CTRS Location:  Dayroom  Group Description: Seated Exercise. LRT discussed the mental and physical benefits of exercise. LRT and group discussed how physical activity can be used as a coping skill. Pt's and LRT followed along to an exercise video on the TV screen that provided a visual representation and audio description of every exercise performed. Pt's encouraged to listen to their bodies and stop at any time if they experience feelings of discomfort or pain. Pts were encouraged to drink water  and stay hydrated.   Goal Area(s) Addressed: Patient will learn benefits of physical activity. Patient will identify exercise as a coping skill.  Patient will follow multistep directions. Patient will try a new leisure interest.    Affect/Mood: N/A   Participation Level: Did not attend    Clinical Observations/Individualized Feedback: Patient did not attend group.   Plan: Continue to engage patient in RT group sessions 2-3x/week.   Jeoffrey BRAVO Celestia, LRT, CTRS 05/23/2023 5:30 PM

## 2023-05-23 NOTE — Group Note (Signed)
 Date:  05/23/2023 Time:  12:17 AM  Group Topic/Focus:  Wrap-Up Group:   The focus of this group is to help patients review their daily goal of treatment and discuss progress on daily workbooks.    Participation Level:  Did Not Attend  Participation Quality:      Affect:      Cognitive:      Insight: None  Engagement in Group:  None  Modes of Intervention:      Additional Comments:    Tommas CHRISTELLA Bunker 05/23/2023, 12:17 AM

## 2023-05-23 NOTE — Group Note (Signed)
 Date:  05/23/2023 Time:  10:10 AM  Group Topic/Focus:  Goals Group:   The focus of this group is to help patients establish daily goals to achieve during treatment and discuss how the patient can incorporate goal setting into their daily lives to aide in recovery.    Participation Level:  Did Not Attend   Deitra Clap [facility] 05/23/2023, 10:10 AM

## 2023-05-24 LAB — SARS CORONAVIRUS 2 BY RT PCR: SARS Coronavirus 2 by RT PCR: NEGATIVE

## 2023-05-24 NOTE — Group Note (Signed)
 BHH LCSW Group Therapy Note   Group Date: 05/24/2023 Start Time: 1300 End Time: 1345   Type of Therapy/Topic:  Group Therapy:  Emotion Regulation  Participation Level:  Did Not Attend    Description of Group:    The purpose of this group is to assist patients in learning to regulate negative emotions and experience positive emotions. Patients will be guided to discuss ways in which they have been vulnerable to their negative emotions. These vulnerabilities will be juxtaposed with experiences of positive emotions or situations, and patients challenged to use positive emotions to combat negative ones. Special emphasis will be placed on coping with negative emotions in conflict situations, and patients will process healthy conflict resolution skills.  Therapeutic Goals: Patient will identify two positive emotions or experiences to reflect on in order to balance out negative emotions:  Patient will label two or more emotions that they find the most difficult to experience:  Patient will be able to demonstrate positive conflict resolution skills through discussion or role plays:   Summary of Patient Progress: X   Therapeutic Modalities:   Cognitive Behavioral Therapy Feelings Identification Dialectical Behavioral Therapy   Randolm Butte, LCSW

## 2023-05-24 NOTE — B[facility] Counselor (Signed)
 CSW attempted to reach Mark [name], [place] Case Manager [phone] .  CSW left HIPAA compliant voicemail.    [name], MSW, LCSW 05/24/2023 4:28 PM

## 2023-05-24 NOTE — Plan of Care (Signed)
  Problem: Education: Goal: Knowledge of [facility] Education information/materials will improve Outcome: Progressing Goal: Emotional status will improve Outcome: Progressing Goal: Mental status will improve Outcome: Progressing Goal: Verbalization of understanding the information provided will improve Outcome: Progressing   Problem: Education: Goal: Emotional status will improve Outcome: Progressing Goal: Mental status will improve Outcome: Progressing   Problem: Coping: Goal: Ability to verbalize frustrations and anger appropriately will improve Outcome: Progressing Goal: Ability to demonstrate self-control will improve Outcome: Progressing   Problem: Health Behavior/Discharge Planning: Goal: Identification of resources available to assist in meeting health care needs will improve Outcome: Progressing Goal: Compliance with treatment plan for underlying cause of condition will improve Outcome: Progressing   Problem: Physical Regulation: Goal: Ability to maintain clinical measurements within normal limits will improve Outcome: Progressing   Problem: Safety: Goal: Periods of time without injury will increase Outcome: Progressing   Problem: Education: Goal: Knowledge of General Education information will improve Description: Including pain rating scale, medication(s)/side effects and non-pharmacologic comfort measures Outcome: Progressing   Problem: Health Behavior/Discharge Planning: Goal: Ability to manage health-related needs will improve Outcome: Progressing   Problem: Clinical Measurements: Goal: Ability to maintain clinical measurements within normal limits will improve Outcome: Progressing Goal: Will remain free from infection Outcome: Progressing Goal: Diagnostic test results will improve Outcome: Progressing Goal: Respiratory complications will improve Outcome: Progressing Goal: Cardiovascular complication will be avoided Outcome: Progressing    Problem: Activity: Goal: Risk for activity intolerance will decrease Outcome: Progressing   Problem: Nutrition: Goal: Adequate nutrition will be maintained Outcome: Progressing   Problem: Coping: Goal: Level of anxiety will decrease Outcome: Progressing   Problem: Elimination: Goal: Will not experience complications related to bowel motility Outcome: Progressing Goal: Will not experience complications related to urinary retention Outcome: Progressing   Problem: Pain Management: Goal: General experience of comfort will improve Outcome: Progressing   Problem: Safety: Goal: Ability to remain free from injury will improve Outcome: Progressing   Problem: Skin Integrity: Goal: Risk for impaired skin integrity will decrease Outcome: Progressing

## 2023-05-24 NOTE — Group Note (Signed)
 Date:  05/24/2023 Time:  9:50 PM  Group Topic/Focus:  Wellness Toolbox:   The focus of this group is to discuss various aspects of wellness, balancing those aspects and exploring ways to increase the ability to experience wellness.  Patients will create a wellness toolbox for use upon discharge.    Participation Level:  Did Not Attend  Participation Quality:   none  Affect:   none  Cognitive:   none  Insight: None  Engagement in Group:   none  Modes of Intervention:   none  Additional Comments:  none  Fabiola Holy 05/24/2023, 9:50 PM

## 2023-05-24 NOTE — Progress Notes (Signed)
 Patient voiced no concerns or complaints. Denies SI, HI, AVH. Walks halls for exercise while awake. Comes to medication room for meds. Requests prn for sleep. Given with good relief.  Encouragement and support provided. Safety checks maintained. Medications given as prescribed. Pt receptive and remains safe on unit with q 15 min checks.

## 2023-05-24 NOTE — Progress Notes (Addendum)
 Pt is alert and oriented at this time. He denies anxiety/depression SI/HI as well as AVH at this time. No pain. Pt is med complaint ,Pt was pleasant and cooperative .Pt attended group and was sociable with staff and peers.Will continue to monitor pt .

## 2023-05-24 NOTE — Group Note (Unsigned)
 Date:  05/24/2023 Time:  9:42 PM  Group Topic/Focus:  Wellness Toolbox:   The focus of this group is to discuss various aspects of wellness, balancing those aspects and exploring ways to increase the ability to experience wellness.  Patients will create a wellness toolbox for use upon discharge.     Participation Level:  {BHH PARTICIPATION BJYNW:29562}  Participation Quality:  {BHH PARTICIPATION QUALITY:22265}  Affect:  {BHH AFFECT:22266}  Cognitive:  {BHH COGNITIVE:22267}  Insight: {BHH Insight2:20797}  Engagement in Group:  {BHH ENGAGEMENT IN ZHYQM:57846}  Modes of Intervention:  {BHH MODES OF INTERVENTION:22269}  Additional Comments:  ***  Mark [name] 05/24/2023, 9:42 PM

## 2023-05-24 NOTE — Progress Notes (Signed)
   05/24/23 1100  Psych Admission Type (Psych Patients Only)  Admission Status Involuntary  Psychosocial Assessment  Patient Complaints None  Eye Contact Fair  Facial Expression Worried  Affect Appropriate to circumstance  Speech Logical/coherent  Interaction Assertive  Motor Activity Fidgety;Tremors  Appearance/Hygiene In scrubs  Behavior Characteristics Cooperative;Appropriate to situation  Mood Pleasant (patient states "I'm good, I'm just waiting for them to find me somewhere to go".)  Aggressive Behavior  Effect No apparent injury  Thought Process  Coherency WDL  Content WDL  Delusions None reported or observed  Perception WDL  Hallucination None reported or observed  Judgment WDL  Confusion None  Danger to Self  Current suicidal ideation? Denies  Self-Injurious Behavior No self-injurious ideation or behavior indicators observed or expressed   Agreement Not to Harm Self Yes  Description of Agreement Verbal  Danger to Others  Danger to Others None reported or observed

## 2023-05-24 NOTE — Group Note (Deleted)
 Date:  05/24/2023 Time:  11:15 AM  Group Topic/Focus:  Self Esteem Action Plan:   The focus of this group is to help patients create a plan to continue to build self-esteem after discharge.     Participation Level:  {BHH PARTICIPATION XWRUE:45409}  Participation Quality:  {BHH PARTICIPATION QUALITY:22265}  Affect:  {BHH AFFECT:22266}  Cognitive:  {BHH COGNITIVE:22267}  Insight: {BHH Insight2:20797}  Engagement in Group:  {BHH ENGAGEMENT IN WJXBJ:47829}  Modes of Intervention:  {BHH MODES OF INTERVENTION:22269}  Additional Comments:  ***  Dow Gemma 05/24/2023, 11:15 AM

## 2023-05-24 NOTE — Plan of Care (Signed)
  Problem: Education: Goal: Emotional status will improve Outcome: Progressing Goal: Mental status will improve Outcome: Progressing Goal: Verbalization of understanding the information provided will improve Outcome: Progressing   Problem: Education: Goal: Emotional status will improve Outcome: Progressing Goal: Mental status will improve Outcome: Progressing   Problem: Coping: Goal: Ability to verbalize frustrations and anger appropriately will improve Outcome: Progressing

## 2023-05-25 NOTE — BHH Counselor (Signed)
CSW spoke with Amor at [facility].  CSW confirmed that patient remains on the unit at this time.   CSW was informed that no beds are available, however, pt remains on the waitlist.  Penni Homans, MSW, LCSW 05/25/2023 9:28 AM

## 2023-05-25 NOTE — Progress Notes (Signed)
Pt present sad and preoccupied in thoughts, making reference to wanting to go home, compllied with taking his medicines and attended select groups. Denied SI/HI and AVH. Pt maintained on q 15 min rounds.     05/25/23 1000  Psych Admission Type (Psych Patients Only)  Admission Status Involuntary  Psychosocial Assessment  Patient Complaints None  Eye Contact Fair  Facial Expression Sad  Affect Appropriate to circumstance  Speech Logical/coherent  Interaction Assertive  Motor Activity Pacing  Appearance/Hygiene In scrubs  Behavior Characteristics Appropriate to situation  Mood Anxious;Pleasant  Aggressive Behavior  Effect No apparent injury  Thought Process  Coherency WDL  Content WDL  Delusions None reported or observed  Perception WDL  Hallucination None reported or observed  Judgment WDL  Confusion None  Danger to Self  Current suicidal ideation? Denies  Self-Injurious Behavior No self-injurious ideation or behavior indicators observed or expressed   Agreement Not to Harm Self Yes  Description of Agreement verbal  Danger to Others  Danger to Others None reported or observed

## 2023-05-25 NOTE — Group Note (Signed)
[facility] LCSW Group Therapy Note   Group Date: 05/25/2023 Start Time: 1320 End Time: 1400   Type of Therapy/Topic:  Group Therapy:  Balance in Life  Participation Level:  Did Not Attend   Description of Group:    This group will address the concept of balance and how it feels and looks when one is unbalanced. Patients will be encouraged to process areas in their lives that are out of balance, and identify reasons for remaining unbalanced. Facilitators will guide patients utilizing problem- solving interventions to address and correct the stressor making their life unbalanced. Understanding and applying boundaries will be explored and addressed for obtaining  and maintaining a balanced life. Patients will be encouraged to explore ways to assertively make their unbalanced needs known to significant others in their lives, using other group members and facilitator for support and feedback.  Therapeutic Goals: Patient will identify two or more emotions or situations they have that consume much of in their lives. Patient will identify signs/triggers that life has become out of balance:  Patient will identify two ways to set boundaries in order to achieve balance in their lives:  Patient will demonstrate ability to communicate their needs through discussion and/or role plays  Summary of Patient Progress: X   Therapeutic Modalities:   Cognitive Behavioral Therapy Solution-Focused Therapy Assertiveness Training   Glenis Smoker, LCSW

## 2023-05-25 NOTE — BHH Counselor (Signed)
CSW attempted to contact the patients father, Mark [name], [phone].    Call was not answered and CSW unable to leave a HIPAA compliant voicemail.    Another attempt will be needed. Call was to identify any funds patient receives.  Penni Homans, MSW, LCSW 05/25/2023 10:26 AM

## 2023-05-25 NOTE — Group Note (Signed)
Date:  05/25/2023 Time:  8:42 PM  Group Topic/Focus:  Wellness Toolbox:   The focus of this group is to discuss various aspects of wellness, balancing those aspects and exploring ways to increase the ability to experience wellness.  Patients will create a wellness toolbox for use upon discharge.    Participation Level:  Did Not Attend   Lenore Cordia 05/25/2023, 8:42 PM

## 2023-05-25 NOTE — Plan of Care (Signed)
  Problem: Education: Goal: Knowledge of [place] [facility] Education information/materials will improve Outcome: Progressing Goal: Emotional status will improve Outcome: Progressing Goal: Mental status will improve Outcome: Progressing Goal: Verbalization of understanding the information provided will improve Outcome: Progressing   Problem: Education: Goal: Emotional status will improve Outcome: Progressing Goal: Mental status will improve Outcome: Progressing   Problem: Coping: Goal: Ability to verbalize frustrations and anger appropriately will improve Outcome: Progressing Goal: Ability to demonstrate self-control will improve Outcome: Progressing   Problem: Physical Regulation: Goal: Ability to maintain clinical measurements within normal limits will improve Outcome: Progressing   Problem: Safety: Goal: Periods of time without injury will increase Outcome: Progressing

## 2023-05-25 NOTE — Group Note (Signed)
Recreation Therapy Group Note   Group Topic:Coping Skills  Group Date: 05/25/2023 Start Time: 1000 End Time: 1045 Facilitators: Rosina Lowenstein, LRT, CTRS Location:  Craft Room  Group Description: Mind Map.  Patient was provided a blank template of a diagram with 32 blank boxes in a tiered system, branching from the center (similar to a bubble chart). LRT directed patients to label the middle of the diagram "Coping Skills". LRT and patients then came up with 8 different coping skills as examples. Pt were directed to record their coping skills in the 2nd tier boxes closest to the center.  Patients would then share their coping skills with the group as LRT wrote them out. LRT gave a handout of 99 different coping skills at the end of group.   Goal Area(s) Addressed: Patients will be able to define "coping skills". Patient will identify new coping skills.  Patient will increase communication.   Affect/Mood: N/A   Participation Level: Did not attend    Clinical Observations/Individualized Feedback: Patient did not attend group.   Plan: Continue to engage patient in RT group sessions 2-3x/week.   Rosina Lowenstein, LRT, CTRS 05/25/2023 11:58 AM

## 2023-05-25 NOTE — Group Note (Signed)
Recreation Therapy Group Note   Group Topic:Relaxation  Group Date: 05/25/2023 Start Time: 1530 End Time: 1605 Facilitators: Rosina Lowenstein, LRT, CTRS Location:  Dayroom  Group Description: Meditation. LRT and patients discussed what they know about meditation and mindfulness. LRT played a Deep Breathing Meditation exercise script for patients to follow along to. LRT and patients discussed how meditation and deep breathing can be used as a coping skill post--discharge to help manage symptoms of stress.   Goal Area(s) Addressed: Patient will practice using relaxation technique. Patient will identify a new coping skill.  Patient will follow multistep directions to reduce anxiety and stress.  Affect/Mood: N/A   Participation Level: Did not attend    Clinical Observations/Individualized Feedback: Patient did not attend group.   Plan: Continue to engage patient in RT group sessions 2-3x/week.   Rosina Lowenstein, LRT, CTRS 05/25/2023 5:28 PM

## 2023-05-25 NOTE — BHH Counselor (Signed)
CSW contacted [place], [phone], in an effort to figure out the patient's care coordination status.    CSW was informed that the patient had not been assigned care management services at all.  Staff reported that she was going to request that he get activated for care management. Once that happened will be assigned a care coordinator.   CSW informed that per chart review CSW [place] completed this on 05/15/2023.  CSW further updated that this CSW spoke with Victorino Dike RN Case Manager [phone], on 05/19/2023, who reported that she does not do inpatient, however, would refer me to [facility].  CSW explained that since that communication on 05/19/2023, no communication has been received from either [place] or [place].   Staff reported that she would send a message to [place] and her supervisor regarding the above.  She reports that she can not reach out to [place] due to now knowing a last name, however, CSW did provide her the contact information for [place] and staff took it down.  CSW was informed that [place] extension is [phone].  CSW left HIPAA compliant voicemail on Laquanda's line.  Penni Homans, MSW, LCSW 05/25/2023 10:19 AM

## 2023-05-26 NOTE — Progress Notes (Signed)
   05/26/23 1100  Psych Admission Type (Psych Patients Only)  Admission Status Involuntary  Psychosocial Assessment  Patient Complaints None  Eye Contact Fair  Facial Expression Sad  Affect Appropriate to circumstance  Speech Logical/coherent  Interaction Other (Comment) (WNL)  Motor Activity Other (Comment) (WNL)  Appearance/Hygiene In scrubs  Behavior Characteristics Appropriate to situation  Mood Pleasant  Thought Process  Coherency WDL  Content WDL  Delusions None reported or observed  Perception WDL  Hallucination None reported or observed  Judgment WDL  Confusion WDL  Danger to Self  Current suicidal ideation? Denies  Self-Injurious Behavior No self-injurious ideation or behavior indicators observed or expressed   Agreement Not to Harm Self Yes  Description of Agreement verbal  Danger to Others  Danger to Others None reported or observed

## 2023-05-26 NOTE — Progress Notes (Signed)
   05/26/23 1726  Vital Signs  Temp 98.1 F (36.7 C)  Pulse Rate 77  Pulse Rate Source Monitor  BP (!) 160/89  BP Location Left Arm  BP Method Automatic  Patient Position (if appropriate) Sitting  Oxygen Therapy  SpO2 100 %  O2 Device Room Air

## 2023-05-26 NOTE — Progress Notes (Signed)
BP re-check, while waiting for PRN medication for heartburn.   05/26/23 1908  Vital Signs  Pulse Rate 80  BP (!) 154/87  BP Location Left Arm  BP Method Automatic  Patient Position (if appropriate) Sitting

## 2023-05-26 NOTE — Progress Notes (Signed)
[facility] MD Progress Note  05/25/2023 1128 WAFI SIMOES  MRN:  [id number]  Subjective:  Mark [name] is a 63 year old African American male with a history of schizophrenia, HTN, and COPD who is here due to aggressive behavior toward his father. He has been living with father in [place]. Per father, there is an order of protection in place.  Chart reviewed, case discussed in multidisciplinary meeting, and patient seen on rounds. Arnet reports he is doing "fine" today. He denies thoughts of harming self and others. He denies perceptual disturbances. He is not observed responding to internal stimuli. Kaitlyn is visible in the milieu. He is adherent with medications. Reports "good" sleep and appetite. He has not complaints today. He states he no longer wants to be in the hospital and is ready to be discharged. He understands that he is unable to return home to live with father and states he needs a group home or transitional home. Social work reports contacting [employer] multiple times for assistance without success.  Principal Problem: Paranoid schizophrenia (HCC) Diagnosis: Principal Problem:   Paranoid schizophrenia (HCC)  Total Time spent with patient: 20 minutes  Past Psychiatric History: see h&p  Past Medical History:  Past Medical History:  Diagnosis Date   COPD (chronic obstructive pulmonary disease) (HCC)    Hypertension    Schizophrenia (HCC)     Past Surgical History:  Procedure Laterality Date   LOWER EXTREMITY ANGIOGRAPHY N/A 09/18/2017   Procedure: LOWER EXTREMITY ANGIOGRAPHY- Recheck lysis;  Surgeon: Maeola Harman, MD;  Location: [facility] INVASIVE CV LAB;  Service: Cardiovascular;  Laterality: N/A;   PERIPHERAL VASCULAR INTERVENTION Left 09/18/2017   Procedure: PERIPHERAL VASCULAR INTERVENTION;  Surgeon: Maeola Harman, MD;  Location: [facility] INVASIVE CV LAB;  Service: Cardiovascular;  Laterality: Left;   THROMBECTOMY FEMORAL ARTERY Left  09/17/2017   Procedure: POSSIBLE THROMBECTOMY;  Surgeon: Maeola Harman, MD;  Location: [facility] OR;  Service: Vascular;  Laterality: Left;   VENOGRAM Left 09/17/2017   Procedure: ULTRASOUND POPLITEAL ACCESS; CENTRAL VENOGRAM, IVVS, LYSIS CATHETER PLACEMENT;  Surgeon: Maeola Harman, MD;  Location: [facility] OR;  Service: Vascular;  Laterality: Left;   Family History: History reviewed. No pertinent family history. Family Psychiatric  History: see h&p Social History:  Social History   Substance and Sexual Activity  Alcohol Use No     Social History   Substance and Sexual Activity  Drug Use Never    Social History   Socioeconomic History   Marital status: Single    Spouse name: Not on file   Number of children: Not on file   Years of education: Not on file   Highest education level: Not on file  Occupational History   Not on file  Tobacco Use   Smoking status: Every Day    Current packs/day: 0.50    Types: Cigarettes   Smokeless tobacco: Never  Vaping Use   Vaping status: Never Used  Substance and Sexual Activity   Alcohol use: No   Drug use: Never   Sexual activity: Not Currently  Other Topics Concern   Not on file  Social History Narrative   Not on file   Social Drivers of Health   Financial Resource Strain: Not on file  Food Insecurity: No Food Insecurity (04/27/2023)   Hunger Vital Sign    Worried About Running Out of Food in the Last Year: Never true    Ran Out of Food in the Last Year: Never true  Transportation Needs: No  Transportation Needs (04/27/2023)   PRAPARE - Administrator, Civil Service (Medical): No    Lack of Transportation (Non-Medical): No  Physical Activity: Not on file  Stress: Not on file  Social Connections: Not on file   Additional Social History:                         Sleep: Good  Appetite:  Good  Current Medications: Current Facility-Administered Medications  Medication Dose Route Frequency  Provider Last Rate Last Admin   acetaminophen (TYLENOL) tablet 650 mg  650 mg Oral Q6H PRN Motley-Mangrum, [name], PMHNP       alum & mag hydroxide-simeth (MAALOX/MYLANTA) 200-200-20 MG/5ML suspension 30 mL  30 mL Oral Q4H PRN Motley-Mangrum, [name], PMHNP   30 mL at 05/15/23 1351   amantadine (SYMMETREL) capsule 100 mg  100 mg Oral BID Lewanda Rife, MD   100 mg at 05/26/23 0900   amLODipine (NORVASC) tablet 5 mg  5 mg Oral Daily Lewanda Rife, MD   5 mg at 05/26/23 1610   aspirin EC tablet 81 mg  81 mg Oral Daily Lewanda Rife, MD   81 mg at 05/26/23 0905   haloperidol (HALDOL) tablet 5 mg  5 mg Oral TID PRN Motley-Mangrum, [name], PMHNP   5 mg at 05/09/23 9604   And   diphenhydrAMINE (BENADRYL) capsule 50 mg  50 mg Oral TID PRN Motley-Mangrum, [name], PMHNP   50 mg at 05/09/23 0834   haloperidol lactate (HALDOL) injection 10 mg  10 mg Intramuscular TID PRN Motley-Mangrum, [name], PMHNP   10 mg at 05/19/23 1242   And   diphenhydrAMINE (BENADRYL) injection 50 mg  50 mg Intramuscular TID PRN Motley-Mangrum, [name], PMHNP       And   LORazepam (ATIVAN) injection 2 mg  2 mg Intramuscular TID PRN Motley-Mangrum, [name], PMHNP       haloperidol lactate (HALDOL) injection 5 mg  5 mg Intramuscular TID PRN Motley-Mangrum, [name], PMHNP       And   diphenhydrAMINE (BENADRYL) injection 50 mg  50 mg Intramuscular TID PRN Motley-Mangrum, [name], PMHNP   50 mg at 05/19/23 1240   And   LORazepam (ATIVAN) injection 2 mg  2 mg Intramuscular TID PRN Motley-Mangrum, [name], PMHNP   2 mg at 05/19/23 1240   divalproex (DEPAKOTE ER) 24 hr tablet 500 mg  500 mg Oral BID Motley-Mangrum, [name], PMHNP   500 mg at 05/26/23 0900   gabapentin (NEURONTIN) capsule 400 mg  400 mg Oral BID Motley-Mangrum, [name], PMHNP   400 mg at 05/26/23 0905   hydrOXYzine (ATARAX) tablet 25 mg  25 mg Oral TID PRN Motley-Mangrum, [name], PMHNP   25 mg at 05/22/23 2059   LORazepam (ATIVAN) tablet 1 mg   1 mg Oral Q8H PRN Motley-Mangrum, [name], PMHNP   1 mg at 05/09/23 0833   magnesium hydroxide (MILK OF MAGNESIA) suspension 30 mL  30 mL Oral Daily PRN Motley-Mangrum, [name], PMHNP       OLANZapine (ZYPREXA) tablet 15 mg  15 mg Oral QHS Lewanda Rife, MD   15 mg at 05/25/23 2122   traZODone (DESYREL) tablet 100 mg  100 mg Oral QHS PRN Motley-Mangrum, [name], PMHNP   100 mg at 05/25/23 2122   valbenazine ([name]) capsule 40 mg  40 mg Oral Daily [name], DO   40 mg at 05/26/23 0900  Lab Results: No results found for this or any previous visit (from the past 48 hours).  Blood Alcohol level:  Lab Results  Component Value Date   ETH <10 04/25/2023   ETH <10 12/31/2022    Metabolic Disorder Labs: Lab Results  Component Value Date   HGBA1C 5.5 12/31/2022   MPG 111.15 12/31/2022   MPG 134.11 12/29/2021   No results found for: "PROLACTIN" Lab Results  Component Value Date   CHOL 133 12/31/2022   TRIG 39 12/31/2022   HDL 54 12/31/2022   CHOLHDL 2.5 12/31/2022   VLDL 8 12/31/2022   LDLCALC 71 12/31/2022   LDLCALC 69 12/01/2022    Physical Findings: AIMS:  , ,  ,  ,    CIWA:    COWS:     Musculoskeletal: Strength & Muscle Tone: within normal limits Gait & Station: normal Patient leans: N/A  Psychiatric Specialty Exam:  Presentation  General Appearance:  Appropriate for Environment  Eye Contact: Good  Speech: Clear and Coherent  Speech Volume: Increased  Handedness: Right   Mood and Affect  Mood: Irritable  Affect: Congruent   Thought Process  Thought Processes: Coherent  Descriptions of Associations:Circumstantial  Orientation:Full (Time, Place and Person)  Thought Content:Logical  History of Schizophrenia/Schizoaffective disorder:Yes  Duration of Psychotic Symptoms:Greater than six months  Hallucinations:Hallucinations: None  Ideas of Reference:None  Suicidal Thoughts:Suicidal Thoughts: No  Homicidal  Thoughts:Homicidal Thoughts: No   Sensorium  Memory: Immediate Good; Recent Good; Remote Good  Judgment: Good  Insight: Good   Executive Functions  Concentration: Good  Attention Span: Good  Recall: Good  Fund of Knowledge: Good  Language: Good   Psychomotor Activity  Psychomotor Activity: Psychomotor Activity: Normal   Assets  Assets: Communication Skills; Desire for Improvement   Sleep  Sleep: Sleep: Good    Physical Exam: Physical Exam Vitals and nursing note reviewed.  HENT:     Head: Normocephalic and atraumatic.     Nose: Nose normal.  Eyes:     Extraocular Movements: Extraocular movements intact.  Pulmonary:     Effort: Pulmonary effort is normal.  Musculoskeletal:        General: Normal range of motion.     Cervical back: Normal range of motion.  Skin:    General: Skin is warm and dry.  Neurological:     Mental Status: He is alert and oriented to person, place, and time. Mental status is at baseline.  Psychiatric:        Mood and Affect: Mood normal.        Behavior: Behavior normal.        Thought Content: Thought content normal.        Judgment: Judgment normal.    Review of Systems  All other systems reviewed and are negative.  Blood pressure (!) 147/77, pulse 63, temperature 98.6 F (37 C), resp. rate 18, height 5\' 11"  (1.803 m), weight 71.2 kg, SpO2 98%. Body mass index is 21.9 kg/m.   Treatment Plan Summary: Daily contact with patient to assess and evaluate symptoms and progress in treatment and Medication management  Continue olanzapine 15 mg at bedtime for schizophrenia Continue Depakote 500 mg po BID for mood Continue [name] 40 mg p.o. daily for TD Continue Norvasc 5 mg by mouth daily for HTN   Valproic acid level : 60, ALT/AST 10/16   Lekeith Wulf E Rogelio Waynick, NP 05/25/2023 11:28 AM

## 2023-05-26 NOTE — Group Note (Signed)
Recreation Therapy Group Note   Group Topic:Leisure Education  Group Date: 05/26/2023 Start Time: 1000 End Time: 1100 Facilitators: Rosina Lowenstein, LRT, CTRS Location:  Craft Room  Group Description: Leisure. Patients were given the option to choose from singing karaoke, coloring mandalas, using oil pastels, journaling, or playing with play-doh. LRT and pts discussed the meaning of leisure, the importance of participating in leisure during their free time/when they're outside of the hospital, as well as how our leisure interests can also serve as coping skills.   Goal Area(s) Addressed:  Patient will identify a current leisure interest.  Patient will learn the definition of "leisure". Patient will practice making a positive decision. Patient will have the opportunity to try a new leisure activity. Patient will communicate with peers and LRT.    Affect/Mood: N/A   Participation Level: Did not attend    Clinical Observations/Individualized Feedback: Patient did not attend group.   Plan: Continue to engage patient in RT group sessions 2-3x/week.   Rosina Lowenstein, LRT, CTRS 05/26/2023 11:35 AM

## 2023-05-26 NOTE — Plan of Care (Signed)
Problem: Education: Goal: Knowledge of [facility] Education information/materials will improve 05/26/2023 1749 by Earleen Newport, RN Outcome: Progressing 05/26/2023 1749 by Earleen Newport, RN Outcome: Progressing Goal: Emotional status will improve 05/26/2023 1749 by Earleen Newport, RN Outcome: Progressing 05/26/2023 1749 by Earleen Newport, RN Outcome: Progressing Goal: Mental status will improve 05/26/2023 1749 by Earleen Newport, RN Outcome: Progressing 05/26/2023 1749 by Earleen Newport, RN Outcome: Progressing Goal: Verbalization of understanding the information provided will improve 05/26/2023 1749 by Earleen Newport, RN Outcome: Progressing 05/26/2023 1749 by Earleen Newport, RN Outcome: Progressing   Problem: Education: Goal: Emotional status will improve 05/26/2023 1749 by Earleen Newport, RN Outcome: Progressing 05/26/2023 1749 by Earleen Newport, RN Outcome: Progressing Goal: Mental status will improve 05/26/2023 1749 by Earleen Newport, RN Outcome: Progressing 05/26/2023 1749 by Earleen Newport, RN Outcome: Progressing   Problem: Coping: Goal: Ability to verbalize frustrations and anger appropriately will improve 05/26/2023 1749 by Earleen Newport, RN Outcome: Progressing 05/26/2023 1749 by Earleen Newport, RN Outcome: Progressing Goal: Ability to demonstrate self-control will improve 05/26/2023 1749 by Earleen Newport, RN Outcome: Progressing 05/26/2023 1749 by Earleen Newport, RN Outcome: Progressing   Problem: Health Behavior/Discharge Planning: Goal: Identification of resources available to assist in meeting health care needs will improve 05/26/2023 1749 by Earleen Newport, RN Outcome: Progressing 05/26/2023 1749 by Earleen Newport, RN Outcome: Progressing Goal: Compliance with treatment plan for underlying cause of condition will improve 05/26/2023 1749 by Earleen Newport, RN Outcome: Progressing 05/26/2023 1749 by Earleen Newport,  RN Outcome: Progressing   Problem: Physical Regulation: Goal: Ability to maintain clinical measurements within normal limits will improve 05/26/2023 1749 by Earleen Newport, RN Outcome: Progressing 05/26/2023 1749 by Earleen Newport, RN Outcome: Progressing   Problem: Safety: Goal: Periods of time without injury will increase 05/26/2023 1749 by Earleen Newport, RN Outcome: Progressing 05/26/2023 1749 by Earleen Newport, RN Outcome: Progressing   Problem: Education: Goal: Knowledge of General Education information will improve Description: Including pain rating scale, medication(s)/side effects and non-pharmacologic comfort measures 05/26/2023 1749 by Earleen Newport, RN Outcome: Progressing 05/26/2023 1749 by Earleen Newport, RN Outcome: Progressing   Problem: Health Behavior/Discharge Planning: Goal: Ability to manage health-related needs will improve 05/26/2023 1749 by Earleen Newport, RN Outcome: Progressing 05/26/2023 1749 by Earleen Newport, RN Outcome: Progressing   Problem: Clinical Measurements: Goal: Ability to maintain clinical measurements within normal limits will improve 05/26/2023 1749 by Earleen Newport, RN Outcome: Progressing 05/26/2023 1749 by Earleen Newport, RN Outcome: Progressing Goal: Will remain free from infection 05/26/2023 1749 by Earleen Newport, RN Outcome: Progressing 05/26/2023 1749 by Earleen Newport, RN Outcome: Progressing Goal: Diagnostic test results will improve 05/26/2023 1749 by Earleen Newport, RN Outcome: Progressing 05/26/2023 1749 by Earleen Newport, RN Outcome: Progressing Goal: Respiratory complications will improve 05/26/2023 1749 by Earleen Newport, RN Outcome: Progressing 05/26/2023 1749 by Earleen Newport, RN Outcome: Progressing Goal: Cardiovascular complication will be avoided 05/26/2023 1749 by Earleen Newport, RN Outcome: Progressing 05/26/2023 1749 by Earleen Newport, RN Outcome: Progressing   Problem:  Activity: Goal: Risk for activity intolerance will decrease 05/26/2023 1749 by Earleen Newport, RN Outcome: Progressing 05/26/2023 1749 by Earleen Newport, RN Outcome: Progressing   Problem: Nutrition: Goal: Adequate nutrition will be maintained 05/26/2023 1749 by Earleen Newport, RN Outcome: Progressing 05/26/2023 1749 by Earleen Newport,  RN Outcome: Progressing   Problem: Coping: Goal: Level of anxiety will decrease 05/26/2023 1749 by Earleen Newport, RN Outcome: Progressing 05/26/2023 1749 by Earleen Newport, RN Outcome: Progressing   Problem: Elimination: Goal: Will not experience complications related to bowel motility 05/26/2023 1749 by Earleen Newport, RN Outcome: Progressing 05/26/2023 1749 by Earleen Newport, RN Outcome: Progressing Goal: Will not experience complications related to urinary retention 05/26/2023 1749 by Earleen Newport, RN Outcome: Progressing 05/26/2023 1749 by Earleen Newport, RN Outcome: Progressing   Problem: Pain Management: Goal: General experience of comfort will improve 05/26/2023 1749 by Earleen Newport, RN Outcome: Progressing 05/26/2023 1749 by Earleen Newport, RN Outcome: Progressing   Problem: Safety: Goal: Ability to remain free from injury will improve 05/26/2023 1749 by Earleen Newport, RN Outcome: Progressing 05/26/2023 1749 by Earleen Newport, RN Outcome: Progressing   Problem: Skin Integrity: Goal: Risk for impaired skin integrity will decrease 05/26/2023 1749 by Earleen Newport, RN Outcome: Progressing 05/26/2023 1749 by Earleen Newport, RN Outcome: Progressing

## 2023-05-26 NOTE — Group Note (Signed)
Date:  05/26/2023 Time:  10:07 AM  Group Topic/Focus:  Overcoming Stress:   The focus of this group is to define stress and help patients assess their triggers.    Participation Level:  Did Not Attend   Mark [name] 05/26/2023, 10:07 AM

## 2023-05-26 NOTE — Group Note (Signed)
Date:  05/26/2023 Time:  9:44 PM  Group Topic/Focus:  Self Care:   The focus of this group is to help patients understand the importance of self-care in order to improve or restore emotional, physical, spiritual, interpersonal, and financial health.    Participation Level:  Did Not Attend  Participation Quality:    Affect:    Cognitive:    Insight:   Engagement in Group:    Modes of Intervention:    Additional Comments:    Keneshia Tena 05/26/2023, 9:44 PM

## 2023-05-26 NOTE — Group Note (Signed)
Recreation Therapy Group Note   Group Topic:General Recreation  Group Date: 05/26/2023 Start Time: 1530 End Time: 1620 Facilitators: Clinton Gallant, CTRS Location:  C[facility]  Group Description: Bingo. LRT and patients played multiple games of Bingo with music playing in the background. LRT and pts discussed how this could be a leisure interest and the importance of doing things they enjoy post-discharge. Pts won a Music therapist of candy as a Scientist, research (physical sciences), approved by [employer].    Goal Area(s) Addressed: Patient will identify leisure interests.  Patient will practice healthy decision making. Patient will engage in recreation activity.  Patient will increase communication.    Affect/Mood: N/A   Participation Level: Did not attend    Clinical Observations/Individualized Feedback: Patient did not attend group.   Plan: Continue to engage patient in RT group sessions 2-3x/week.   Rosina Lowenstein, LRT, CTRS 05/26/2023 5:31 PM

## 2023-05-27 NOTE — Progress Notes (Signed)
   05/27/23 1000  Psych Admission Type (Psych Patients Only)  Admission Status Involuntary  Psychosocial Assessment  Patient Complaints Restlessness  Eye Contact Fair  Facial Expression Other (Comment) (appropriate)  Affect Appropriate to circumstance  Speech Logical/coherent  Interaction Assertive  Motor Activity Fidgety;Restless;Pacing;Tremors (patient has significant tremor; patient reports that he has been "up and down, up and down and I don't know why. I slept good".)  Appearance/Hygiene Body odor;Poor hygiene  Behavior Characteristics Cooperative;Appropriate to situation  Mood Pleasant (patient states that he is doing good and "ready to move on".)  Aggressive Behavior  Effect No apparent injury  Thought Process  Coherency WDL  Content WDL  Delusions None reported or observed  Perception WDL  Hallucination None reported or observed  Judgment WDL  Confusion None  Danger to Self  Current suicidal ideation? Denies  Self-Injurious Behavior No self-injurious ideation or behavior indicators observed or expressed   Agreement Not to Harm Self Yes  Description of Agreement Verbal  Danger to Others  Danger to Others None reported or observed

## 2023-05-27 NOTE — Plan of Care (Signed)
Alert and oriented X4. Denies SI/HI/AVH and pain. Compliant w/medication administration. 15 min safety checks maintain/ongoing.   Problem: Education: Goal: Knowledge of [facility] Education information/materials will improve Outcome: Progressing Goal: Emotional status will improve Outcome: Progressing Goal: Mental status will improve Outcome: Progressing Goal: Verbalization of understanding the information provided will improve Outcome: Progressing   Problem: Education: Goal: Emotional status will improve Outcome: Progressing Goal: Mental status will improve Outcome: Progressing   Problem: Coping: Goal: Ability to verbalize frustrations and anger appropriately will improve Outcome: Progressing Goal: Ability to demonstrate self-control will improve Outcome: Progressing   Problem: Health Behavior/Discharge Planning: Goal: Identification of resources available to assist in meeting health care needs will improve Outcome: Progressing Goal: Compliance with treatment plan for underlying cause of condition will improve Outcome: Progressing   Problem: Physical Regulation: Goal: Ability to maintain clinical measurements within normal limits will improve Outcome: Progressing   Problem: Safety: Goal: Periods of time without injury will increase Outcome: Progressing   Problem: Education: Goal: Knowledge of General Education information will improve Description: Including pain rating scale, medication(s)/side effects and non-pharmacologic comfort measures Outcome: Progressing   Problem: Health Behavior/Discharge Planning: Goal: Ability to manage health-related needs will improve Outcome: Progressing   Problem: Clinical Measurements: Goal: Ability to maintain clinical measurements within normal limits will improve Outcome: Progressing Goal: Will remain free from infection Outcome: Progressing Goal: Diagnostic test results will improve Outcome: Progressing Goal:  Respiratory complications will improve Outcome: Progressing Goal: Cardiovascular complication will be avoided Outcome: Progressing   Problem: Activity: Goal: Risk for activity intolerance will decrease Outcome: Progressing   Problem: Nutrition: Goal: Adequate nutrition will be maintained Outcome: Progressing   Problem: Coping: Goal: Level of anxiety will decrease Outcome: Progressing   Problem: Elimination: Goal: Will not experience complications related to bowel motility Outcome: Progressing Goal: Will not experience complications related to urinary retention Outcome: Progressing   Problem: Pain Management: Goal: General experience of comfort will improve Outcome: Progressing   Problem: Safety: Goal: Ability to remain free from injury will improve Outcome: Progressing   Problem: Skin Integrity: Goal: Risk for impaired skin integrity will decrease Outcome: Progressing

## 2023-05-27 NOTE — Progress Notes (Signed)
   05/26/23 2102  Psych Admission Type (Psych Patients Only)  Admission Status Involuntary  Psychosocial Assessment  Patient Complaints None  Eye Contact Brief  Facial Expression Animated  Affect Appropriate to circumstance  Speech Logical/coherent  Interaction Assertive  Motor Activity Tremors  Appearance/Hygiene Bizarre  Behavior Characteristics Appropriate to situation  Mood Pleasant  Aggressive Behavior  Effect No apparent injury  Thought Process  Coherency WDL  Content WDL  Delusions None reported or observed  Perception WDL  Hallucination None reported or observed  Judgment WDL  Confusion WDL  Danger to Self  Current suicidal ideation? Denies  Agreement Not to Harm Self Yes  Description of Agreement Verbal  Danger to Others  Danger to Others None reported or observed

## 2023-05-27 NOTE — Plan of Care (Signed)
  Problem: Education: Goal: Knowledge of [facility] Education information/materials will improve Outcome: Progressing Goal: Emotional status will improve Outcome: Progressing Goal: Mental status will improve Outcome: Progressing Goal: Verbalization of understanding the information provided will improve Outcome: Progressing   Problem: Education: Goal: Emotional status will improve Outcome: Progressing Goal: Mental status will improve Outcome: Progressing   Problem: Coping: Goal: Ability to verbalize frustrations and anger appropriately will improve Outcome: Progressing Goal: Ability to demonstrate self-control will improve Outcome: Progressing   Problem: Health Behavior/Discharge Planning: Goal: Identification of resources available to assist in meeting health care needs will improve Outcome: Progressing Goal: Compliance with treatment plan for underlying cause of condition will improve Outcome: Progressing   Problem: Physical Regulation: Goal: Ability to maintain clinical measurements within normal limits will improve Outcome: Progressing   Problem: Safety: Goal: Periods of time without injury will increase Outcome: Progressing   Problem: Education: Goal: Knowledge of General Education information will improve Description: Including pain rating scale, medication(s)/side effects and non-pharmacologic comfort measures Outcome: Progressing   Problem: Health Behavior/Discharge Planning: Goal: Ability to manage health-related needs will improve Outcome: Progressing   Problem: Clinical Measurements: Goal: Ability to maintain clinical measurements within normal limits will improve Outcome: Progressing Goal: Will remain free from infection Outcome: Progressing Goal: Diagnostic test results will improve Outcome: Progressing Goal: Respiratory complications will improve Outcome: Progressing Goal: Cardiovascular complication will be avoided Outcome: Progressing    Problem: Activity: Goal: Risk for activity intolerance will decrease Outcome: Progressing   Problem: Nutrition: Goal: Adequate nutrition will be maintained Outcome: Progressing   Problem: Coping: Goal: Level of anxiety will decrease Outcome: Progressing   Problem: Elimination: Goal: Will not experience complications related to bowel motility Outcome: Progressing Goal: Will not experience complications related to urinary retention Outcome: Progressing   Problem: Pain Management: Goal: General experience of comfort will improve Outcome: Progressing   Problem: Safety: Goal: Ability to remain free from injury will improve Outcome: Progressing   Problem: Skin Integrity: Goal: Risk for impaired skin integrity will decrease Outcome: Progressing

## 2023-05-27 NOTE — Progress Notes (Signed)
 [facility] MD Progress Note  05/23/2023  [address]  MRN:  [id number]  Subjective:  Britt Petroni is a 63 year old African American male with a history of schizophrenia, HTN, and COPD who is here due to aggressive behavior toward his father. He has been living with father in [place]. Per father, there is an order of protection in place.   Chart reviewed, case discussed in multidisciplinary meeting, and patient seen on rounds. [place] is doing well on the unit. He is adherent with medication regimen and denies side effects. Visible in the milieu and interacting with peers and staff. Kile is unsure if he receives SSI check.  Social work has been contacting [employer] multiple times for assistance with housing options without success. Patient cannot return to father's home and is not appropriate for a shelter.  Principal Problem: Paranoid schizophrenia (HCC) Diagnosis: Principal Problem:   Paranoid schizophrenia (HCC)  Total Time spent with patient: 20 minutes  Past Psychiatric History: see h&p  Past Medical History:  Past Medical History:  Diagnosis Date   COPD (chronic obstructive pulmonary disease) (HCC)    Hypertension    Schizophrenia (HCC)     Past Surgical History:  Procedure Laterality Date   LOWER EXTREMITY ANGIOGRAPHY N/A 09/18/2017   Procedure: LOWER EXTREMITY ANGIOGRAPHY- Recheck lysis;  Surgeon: Sheree Penne Bruckner, MD;  Location: [facility] INVASIVE CV LAB;  Service: Cardiovascular;  Laterality: N/A;   PERIPHERAL VASCULAR INTERVENTION Left 09/18/2017   Procedure: PERIPHERAL VASCULAR INTERVENTION;  Surgeon: Sheree Penne Bruckner, MD;  Location: [facility] INVASIVE CV LAB;  Service: Cardiovascular;  Laterality: Left;   THROMBECTOMY FEMORAL ARTERY Left 09/17/2017   Procedure: POSSIBLE THROMBECTOMY;  Surgeon: Sheree Penne Bruckner, MD;  Location: [facility] OR;  Service: Vascular;  Laterality: Left;   VENOGRAM Left 09/17/2017   Procedure: ULTRASOUND POPLITEAL  ACCESS; CENTRAL VENOGRAM, IVVS, LYSIS CATHETER PLACEMENT;  Surgeon: Sheree Penne Bruckner, MD;  Location: [facility] OR;  Service: Vascular;  Laterality: Left;   Family History: History reviewed. No pertinent family history. Family Psychiatric  History: see h&p Social History:  Social History   Substance and Sexual Activity  Alcohol Use No     Social History   Substance and Sexual Activity  Drug Use Never    Social History   Socioeconomic History   Marital status: Single    Spouse name: Not on file   Number of children: Not on file   Years of education: Not on file   Highest education level: Not on file  Occupational History   Not on file  Tobacco Use   Smoking status: Every Day    Current packs/day: 0.50    Types: Cigarettes   Smokeless tobacco: Never  Vaping Use   Vaping status: Never Used  Substance and Sexual Activity   Alcohol use: No   Drug use: Never   Sexual activity: Not Currently  Other Topics Concern   Not on file  Social History Narrative   Not on file   Social Drivers of Health   Financial Resource Strain: Not on file  Food Insecurity: No Food Insecurity (04/27/2023)   Hunger Vital Sign    Worried About Running Out of Food in the Last Year: Never true    Ran Out of Food in the Last Year: Never true  Transportation Needs: No Transportation Needs (04/27/2023)   PRAPARE - Administrator, Civil Service (Medical): No    Lack of Transportation (Non-Medical): No  Physical Activity: Not on file  Stress: Not  on file  Social Connections: Not on file   Additional Social History:                         Sleep: Good  Appetite:  Good  Current Medications: Current Facility-Administered Medications  Medication Dose Route Frequency Provider Last Rate Last Admin   acetaminophen  (TYLENOL ) tablet 650 mg  650 mg Oral Q6H PRN Motley-Mangrum, [name], PMHNP       alum & mag hydroxide-simeth (MAALOX/MYLANTA) 200-200-20 MG/5ML suspension 30 mL   30 mL Oral Q4H PRN Motley-Mangrum, [name], PMHNP   30 mL at 05/26/23 1908   amantadine  (SYMMETREL ) capsule 100 mg  100 mg Oral BID Victoria Ruts, MD   100 mg at 05/27/23 0919   amLODipine  (NORVASC ) tablet 5 mg  5 mg Oral Daily Parmar, Meenakshi, MD   5 mg at 05/27/23 0919   aspirin  EC tablet 81 mg  81 mg Oral Daily Parmar, Meenakshi, MD   81 mg at 05/27/23 0919   haloperidol  (HALDOL ) tablet 5 mg  5 mg Oral TID PRN Motley-Mangrum, [name], PMHNP   5 mg at 05/09/23 9166   And   diphenhydrAMINE  (BENADRYL ) capsule 50 mg  50 mg Oral TID PRN Motley-Mangrum, [name], PMHNP   50 mg at 05/09/23 9165   haloperidol  lactate (HALDOL ) injection 10 mg  10 mg Intramuscular TID PRN Motley-Mangrum, [name], PMHNP   10 mg at 05/19/23 1242   And   diphenhydrAMINE  (BENADRYL ) injection 50 mg  50 mg Intramuscular TID PRN Motley-Mangrum, [name], PMHNP       And   LORazepam  (ATIVAN ) injection 2 mg  2 mg Intramuscular TID PRN Motley-Mangrum, [name], PMHNP       haloperidol  lactate (HALDOL ) injection 5 mg  5 mg Intramuscular TID PRN Motley-Mangrum, [name], PMHNP       And   diphenhydrAMINE  (BENADRYL ) injection 50 mg  50 mg Intramuscular TID PRN Motley-Mangrum, [name], PMHNP   50 mg at 05/19/23 1240   And   LORazepam  (ATIVAN ) injection 2 mg  2 mg Intramuscular TID PRN Motley-Mangrum, [name], PMHNP   2 mg at 05/19/23 1240   divalproex  (DEPAKOTE  ER) 24 hr tablet 500 mg  500 mg Oral BID Motley-Mangrum, [name], PMHNP   500 mg at 05/27/23 1726   gabapentin  (NEURONTIN ) capsule 400 mg  400 mg Oral BID Motley-Mangrum, [name], PMHNP   400 mg at 05/27/23 0919   hydrOXYzine  (ATARAX ) tablet 25 mg  25 mg Oral TID PRN Motley-Mangrum, [name], PMHNP   25 mg at 05/22/23 2059   LORazepam  (ATIVAN ) tablet 1 mg  1 mg Oral Q8H PRN Motley-Mangrum, [name], PMHNP   1 mg at 05/09/23 9166   magnesium  hydroxide (MILK OF MAGNESIA) suspension 30 mL  30 mL Oral Daily PRN Motley-Mangrum, [name], PMHNP       OLANZapine   (ZYPREXA ) tablet 15 mg  15 mg Oral QHS Parmar, Meenakshi, MD   15 mg at 05/26/23 2100   traZODone  (DESYREL ) tablet 100 mg  100 mg Oral QHS PRN Motley-Mangrum, [name], PMHNP   100 mg at 05/26/23 2100   valbenazine  ([name] ) capsule 40 mg  40 mg Oral Daily Cam Charlie Loving, DO   40 mg at 05/27/23 9080    Lab Results: No results found for this or any previous visit (from the past 48 hours).  Blood Alcohol level:  Lab Results  Component Value Date   ETH <  10 04/25/2023   ETH <10 12/31/2022    Metabolic Disorder Labs: Lab Results  Component Value Date   HGBA1C 5.5 12/31/2022   MPG 111.15 12/31/2022   MPG 134.11 12/29/2021   No results found for: PROLACTIN Lab Results  Component Value Date   CHOL 133 12/31/2022   TRIG 39 12/31/2022   HDL 54 12/31/2022   CHOLHDL 2.5 12/31/2022   VLDL 8 12/31/2022   LDLCALC 71 12/31/2022   LDLCALC 69 12/01/2022    Physical Findings: AIMS:  , ,  ,  ,    CIWA:    COWS:     Musculoskeletal: Strength & Muscle Tone: within normal limits Gait & Station: normal Patient leans: N/A  Psychiatric Specialty Exam:  Presentation  General Appearance:  Appropriate for Environment  Eye Contact: Good  Speech: Clear and Coherent  Speech Volume: Increased  Handedness: Right   Mood and Affect  Mood: Irritable  Affect: Congruent   Thought Process  Thought Processes: Coherent  Descriptions of Associations:Circumstantial  Orientation:Full (Time, Place and Person)  Thought Content:Logical  History of Schizophrenia/Schizoaffective disorder:Yes  Duration of Psychotic Symptoms:Greater than six months  Hallucinations:No data recorded  Ideas of Reference:None  Suicidal Thoughts:No data recorded  Homicidal Thoughts:No data recorded   Sensorium  Memory: Immediate Good; Recent Good; Remote Good  Judgment: Fair  Insight: Fair   Art Therapist  Concentration: Good  Attention  Span: Good  Recall: Good  Fund of Knowledge: Good  Language: Good   Psychomotor Activity  Psychomotor Activity: Normal   Assets  Assets: Communication Skills; Desire for Improvement   Sleep  Sleep: Good    Physical Exam: Physical Exam Vitals and nursing note reviewed.  HENT:     Head: Normocephalic and atraumatic.     Nose: Nose normal.  Eyes:     Extraocular Movements: Extraocular movements intact.  Pulmonary:     Effort: Pulmonary effort is normal.  Musculoskeletal:        General: Normal range of motion.     Cervical back: Normal range of motion.  Skin:    General: Skin is warm and dry.  Neurological:     Mental Status: He is alert and oriented to person, place, and time. Mental status is at baseline.  Psychiatric:        Mood and Affect: Mood normal.        Behavior: Behavior normal.        Thought Content: Thought content normal.        Judgment: Judgment normal.    Review of Systems  All other systems reviewed and are negative.  Blood pressure (!) 150/87, pulse 87, temperature 98.2 F (36.8 C), resp. rate 18, height 5' 11 (1.803 m), weight 71.2 kg, SpO2 99%. Body mass index is 21.9 kg/m.   Treatment Plan Summary: Daily contact with patient to assess and evaluate symptoms and progress in treatment and Medication management  Continue olanzapine  15 mg at bedtime for schizophrenia Continue Depakote  500 mg po BID for mood Continue [name]  40 mg p.o. daily for TD Continue Norvasc  5 mg by mouth daily for HTN   Valproic acid  level : 60, ALT/AST 10/16   Kierre Deines E Voncille Simm, NP

## 2023-05-27 NOTE — Group Note (Signed)
LCSW Group Therapy Note   Group Date: 05/27/2023 Start Time: 1330 End Time: 1405    Type of Therapy and Topic:  Goals after Discharge  Participation Level:  Did Not Attend    Summary of Patient Progress:  The patient did not attend group.    Marshell Levan, LCSWA 05/27/2023  2:19 PM

## 2023-05-28 LAB — HEMOGLOBIN A1C
Hgb A1c MFr Bld: 5.8 % — ABNORMAL HIGH (ref 4.8–5.6)
Mean Plasma Glucose: 119.76 mg/dL

## 2023-05-28 NOTE — Plan of Care (Signed)
  Problem: Education: Goal: Emotional status will improve Outcome: Progressing   Problem: Education: Goal: Emotional status will improve Outcome: Progressing   Problem: Education: Goal: Mental status will improve Outcome: Progressing

## 2023-05-28 NOTE — Progress Notes (Signed)
[facility] MD Progress Note  05/27/2023 1814 Mark [name]  MRN:  [id number]  Subjective:  Mark [name] is a 63 year old African American male with a history of schizophrenia, HTN, and COPD who is here due to aggressive behavior toward his father. He has been living with father in [place]. Per father, there is an order of protection in place.  Chart reviewed, case discussed in multidisciplinary meeting, and patient seen on rounds. No changes overnight. Patient is doing well. No aggressive behaviors. No evidence of acute psychosis. He denies SI/HI. [facility] is awaiting safe placement. Social work assisting with disposition as patient is unable to return to father's home and is not appropriate for shelter.  Principal Problem: Paranoid schizophrenia (HCC) Diagnosis: Principal Problem:   Paranoid schizophrenia (HCC)  Total Time spent with patient: 20 minutes  Past Psychiatric History: see h&p  Past Medical History:  Past Medical History:  Diagnosis Date   COPD (chronic obstructive pulmonary disease) (HCC)    Hypertension    Schizophrenia (HCC)     Past Surgical History:  Procedure Laterality Date   LOWER EXTREMITY ANGIOGRAPHY N/A 09/18/2017   Procedure: LOWER EXTREMITY ANGIOGRAPHY- Recheck lysis;  Surgeon: Maeola Harman, MD;  Location: [facility] INVASIVE CV LAB;  Service: Cardiovascular;  Laterality: N/A;   PERIPHERAL VASCULAR INTERVENTION Left 09/18/2017   Procedure: PERIPHERAL VASCULAR INTERVENTION;  Surgeon: Maeola Harman, MD;  Location: [facility] INVASIVE CV LAB;  Service: Cardiovascular;  Laterality: Left;   THROMBECTOMY FEMORAL ARTERY Left 09/17/2017   Procedure: POSSIBLE THROMBECTOMY;  Surgeon: Maeola Harman, MD;  Location: [facility] OR;  Service: Vascular;  Laterality: Left;   VENOGRAM Left 09/17/2017   Procedure: ULTRASOUND POPLITEAL ACCESS; CENTRAL VENOGRAM, IVVS, LYSIS CATHETER PLACEMENT;  Surgeon: Maeola Harman, MD;  Location: [facility] OR;  Service: Vascular;   Laterality: Left;   Family History: History reviewed. No pertinent family history. Family Psychiatric  History: see h&p Social History:  Social History   Substance and Sexual Activity  Alcohol Use No     Social History   Substance and Sexual Activity  Drug Use Never    Social History   Socioeconomic History   Marital status: Single    Spouse name: Not on file   Number of children: Not on file   Years of education: Not on file   Highest education level: Not on file  Occupational History   Not on file  Tobacco Use   Smoking status: Every Day    Current packs/day: 0.50    Types: Cigarettes   Smokeless tobacco: Never  Vaping Use   Vaping status: Never Used  Substance and Sexual Activity   Alcohol use: No   Drug use: Never   Sexual activity: Not Currently  Other Topics Concern   Not on file  Social History Narrative   Not on file   Social Drivers of Health   Financial Resource Strain: Not on file  Food Insecurity: No Food Insecurity (04/27/2023)   Hunger Vital Sign    Worried About Running Out of Food in the Last Year: Never true    Ran Out of Food in the Last Year: Never true  Transportation Needs: No Transportation Needs (04/27/2023)   PRAPARE - Administrator, Civil Service (Medical): No    Lack of Transportation (Non-Medical): No  Physical Activity: Not on file  Stress: Not on file  Social Connections: Not on file   Additional Social History:  Sleep: Good  Appetite:  Good  Current Medications: Current Facility-Administered Medications  Medication Dose Route Frequency Provider Last Rate Last Admin   acetaminophen (TYLENOL) tablet 650 mg  650 mg Oral Q6H PRN Motley-Mangrum, [name], PMHNP       alum & mag hydroxide-simeth (MAALOX/MYLANTA) 200-200-20 MG/5ML suspension 30 mL  30 mL Oral Q4H PRN Motley-Mangrum, [name], PMHNP   30 mL at 05/26/23 1908   amantadine (SYMMETREL) capsule 100 mg  100 mg Oral BID  Lewanda Rife, MD   100 mg at 05/28/23 0938   amLODipine (NORVASC) tablet 5 mg  5 mg Oral Daily Lewanda Rife, MD   5 mg at 05/28/23 0981   aspirin EC tablet 81 mg  81 mg Oral Daily Lewanda Rife, MD   81 mg at 05/28/23 0939   haloperidol (HALDOL) tablet 5 mg  5 mg Oral TID PRN Motley-Mangrum, [name], PMHNP   5 mg at 05/09/23 1914   And   diphenhydrAMINE (BENADRYL) capsule 50 mg  50 mg Oral TID PRN Motley-Mangrum, [name], PMHNP   50 mg at 05/09/23 0834   haloperidol lactate (HALDOL) injection 10 mg  10 mg Intramuscular TID PRN Motley-Mangrum, [name], PMHNP   10 mg at 05/19/23 1242   And   diphenhydrAMINE (BENADRYL) injection 50 mg  50 mg Intramuscular TID PRN Motley-Mangrum, [name], PMHNP       And   LORazepam (ATIVAN) injection 2 mg  2 mg Intramuscular TID PRN Motley-Mangrum, [name], PMHNP       haloperidol lactate (HALDOL) injection 5 mg  5 mg Intramuscular TID PRN Motley-Mangrum, [name], PMHNP       And   diphenhydrAMINE (BENADRYL) injection 50 mg  50 mg Intramuscular TID PRN Motley-Mangrum, [name], PMHNP   50 mg at 05/19/23 1240   And   LORazepam (ATIVAN) injection 2 mg  2 mg Intramuscular TID PRN Motley-Mangrum, [name], PMHNP   2 mg at 05/19/23 1240   divalproex (DEPAKOTE ER) 24 hr tablet 500 mg  500 mg Oral BID Motley-Mangrum, [name], PMHNP   500 mg at 05/28/23 0939   gabapentin (NEURONTIN) capsule 400 mg  400 mg Oral BID Motley-Mangrum, [name], PMHNP   400 mg at 05/28/23 0939   hydrOXYzine (ATARAX) tablet 25 mg  25 mg Oral TID PRN Motley-Mangrum, [name], PMHNP   25 mg at 05/22/23 2059   LORazepam (ATIVAN) tablet 1 mg  1 mg Oral Q8H PRN Motley-Mangrum, [name], PMHNP   1 mg at 05/09/23 0833   magnesium hydroxide (MILK OF MAGNESIA) suspension 30 mL  30 mL Oral Daily PRN Motley-Mangrum, [name], PMHNP       OLANZapine (ZYPREXA) tablet 15 mg  15 mg Oral QHS Lewanda Rife, MD   15 mg at 05/27/23 2140   traZODone (DESYREL) tablet 100 mg  100 mg Oral QHS  PRN Motley-Mangrum, [name], PMHNP   100 mg at 05/26/23 2100   valbenazine ([name]) capsule 40 mg  40 mg Oral Daily [name], DO   40 mg at 05/28/23 7829    Lab Results: No results found for this or any previous visit (from the past 48 hours).  Blood Alcohol level:  Lab Results  Component Value Date   [facility] <10 04/25/2023   ETH <10 12/31/2022    Metabolic Disorder Labs: Lab Results  Component Value Date   HGBA1C 5.5 12/31/2022   MPG 111.15 12/31/2022   MPG 134.11 12/29/2021   No  results found for: "PROLACTIN" Lab Results  Component Value Date   CHOL 133 12/31/2022   TRIG 39 12/31/2022   HDL 54 12/31/2022   CHOLHDL 2.5 12/31/2022   VLDL 8 12/31/2022   LDLCALC 71 12/31/2022   LDLCALC 69 12/01/2022    Physical Findings: AIMS:  , ,  ,  ,    CIWA:    COWS:     Musculoskeletal: Strength & Muscle Tone: within normal limits Gait & Station: normal Patient leans: N/A  Psychiatric Specialty Exam:  Presentation  General Appearance:  Appropriate for Environment  Eye Contact: Good  Speech: Clear and Coherent  Speech Volume: Normal  Handedness: Right   Mood and Affect  Mood: Irritable  Affect: Congruent   Thought Process  Thought Processes: Coherent  Descriptions of Associations:Circumstantial  Orientation:Full (Time, Place and Person)  Thought Content:Logical  History of Schizophrenia/Schizoaffective disorder:Yes  Duration of Psychotic Symptoms:Greater than six months  Hallucinations:Denies  Ideas of Reference:None  Suicidal Thoughts:Denies  Homicidal Thoughts:Denies   Sensorium  Memory: Immediate Good; Recent Good; Remote Good  Judgment: Good  Insight: Good   Executive Functions  Concentration: Good  Attention Span: Good  Recall: Good  Fund of Knowledge: Good  Language: Good   Psychomotor Activity  Psychomotor Activity: No data recorded   Assets  Assets: Communication Skills; Desire for  Improvement   Sleep  Sleep: No data recorded    Physical Exam: Physical Exam Vitals and nursing note reviewed.  HENT:     Head: Normocephalic and atraumatic.     Nose: Nose normal.  Eyes:     Extraocular Movements: Extraocular movements intact.  Pulmonary:     Effort: Pulmonary effort is normal.  Musculoskeletal:        General: Normal range of motion.     Cervical back: Normal range of motion.  Skin:    General: Skin is warm and dry.  Neurological:     Mental Status: He is alert and oriented to person, place, and time. Mental status is at baseline.  Psychiatric:        Mood and Affect: Mood normal.        Behavior: Behavior normal.        Thought Content: Thought content normal.        Judgment: Judgment normal.    Review of Systems  All other systems reviewed and are negative.  Blood pressure 136/76, pulse 74, temperature 98.1 F (36.7 C), resp. rate 17, height 5\' 11"  (1.803 m), weight 71.2 kg, SpO2 100%. Body mass index is 21.9 kg/m.   Treatment Plan Summary: Daily contact with patient to assess and evaluate symptoms and progress in treatment and Medication management  Continue olanzapine 15 mg at bedtime for schizophrenia Continue Depakote 500 mg po BID for mood Continue [name] 40 mg p.o. daily for TD Continue Norvasc 5 mg by mouth daily for HTN   Valproic acid level : 60, ALT/AST 10/16   Mavryk Pino E Verlan Grotz, NP

## 2023-05-28 NOTE — Progress Notes (Signed)
[facility] MD Progress Note  05/24/2023  1600 Mark [name]  MRN:  [id number]  Subjective:  Mark [name] is a 63 year old African American male with a history of schizophrenia, HTN, and COPD who is here due to aggressive behavior toward his father. He has been living with father in [place]. Per father, there is an order of protection in place.   Chart reviewed, case discussed in multidisciplinary meeting, and patient seen on rounds. Mark [name] is doing well on the unit. He is adherent with medication regimen and denies side effects. Visible in the milieu and interacting with peers and staff. Denies SI/HI. No aggressive behaviors. Social work is considering placement options and attempting to contact father to verify if patient receives monthly income. There is still [facility] from [facility] coordination. Patient cannot return to father's home and is not appropriate for a shelter.  Principal Problem: Paranoid schizophrenia (HCC) Diagnosis: Principal Problem:   Paranoid schizophrenia (HCC)  Total Time spent with patient: 20 minutes  Past Psychiatric History: see h&p  Past Medical History:  Past Medical History:  Diagnosis Date   COPD (chronic obstructive pulmonary disease) (HCC)    Hypertension    Schizophrenia (HCC)     Past Surgical History:  Procedure Laterality Date   LOWER EXTREMITY ANGIOGRAPHY N/A 09/18/2017   Procedure: LOWER EXTREMITY ANGIOGRAPHY- Recheck lysis;  Surgeon: Maeola Harman, MD;  Location: [facility] INVASIVE CV LAB;  Service: Cardiovascular;  Laterality: N/A;   PERIPHERAL VASCULAR INTERVENTION Left 09/18/2017   Procedure: PERIPHERAL VASCULAR INTERVENTION;  Surgeon: Maeola Harman, MD;  Location: [facility] INVASIVE CV LAB;  Service: Cardiovascular;  Laterality: Left;   THROMBECTOMY FEMORAL ARTERY Left 09/17/2017   Procedure: POSSIBLE THROMBECTOMY;  Surgeon: Maeola Harman, MD;  Location: [facility] OR;  Service: Vascular;  Laterality: Left;   VENOGRAM  Left 09/17/2017   Procedure: ULTRASOUND POPLITEAL ACCESS; CENTRAL VENOGRAM, IVVS, LYSIS CATHETER PLACEMENT;  Surgeon: Maeola Harman, MD;  Location: [facility] OR;  Service: Vascular;  Laterality: Left;   Family History: History reviewed. No pertinent family history. Family Psychiatric  History: see h&p Social History:  Social History   Substance and Sexual Activity  Alcohol Use No     Social History   Substance and Sexual Activity  Drug Use Never    Social History   Socioeconomic History   Marital status: Single    Spouse name: Not on file   Number of children: Not on file   Years of education: Not on file   Highest education level: Not on file  Occupational History   Not on file  Tobacco Use   Smoking status: Every Day    Current packs/day: 0.50    Types: Cigarettes   Smokeless tobacco: Never  Vaping Use   Vaping status: Never Used  Substance and Sexual Activity   Alcohol use: No   Drug use: Never   Sexual activity: Not Currently  Other Topics Concern   Not on file  Social History Narrative   Not on file   Social Drivers of Health   Financial Resource Strain: Not on file  Food Insecurity: No Food Insecurity (04/27/2023)   Hunger Vital Sign    Worried About Running Out of Food in the Last Year: Never true    Ran Out of Food in the Last Year: Never true  Transportation Needs: No Transportation Needs (04/27/2023)   PRAPARE - Administrator, Civil Service (Medical): No    Lack of Transportation (Non-Medical): No  Physical Activity:  Not on file  Stress: Not on file  Social Connections: Not on file   Additional Social History:                         Sleep: Good  Appetite:  Good  Current Medications: Current Facility-Administered Medications  Medication Dose Route Frequency Provider Last Rate Last Admin   acetaminophen (TYLENOL) tablet 650 mg  650 mg Oral Q6H PRN Motley-Mangrum, [name], PMHNP       alum & mag hydroxide-simeth  (MAALOX/MYLANTA) 200-200-20 MG/5ML suspension 30 mL  30 mL Oral Q4H PRN Motley-Mangrum, [name], PMHNP   30 mL at 05/26/23 1908   amantadine (SYMMETREL) capsule 100 mg  100 mg Oral BID Lewanda Rife, MD   100 mg at 05/27/23 2141   amLODipine (NORVASC) tablet 5 mg  5 mg Oral Daily Lewanda Rife, MD   5 mg at 05/27/23 0919   aspirin EC tablet 81 mg  81 mg Oral Daily Lewanda Rife, MD   81 mg at 05/27/23 0919   haloperidol (HALDOL) tablet 5 mg  5 mg Oral TID PRN Motley-Mangrum, [name], PMHNP   5 mg at 05/09/23 2841   And   diphenhydrAMINE (BENADRYL) capsule 50 mg  50 mg Oral TID PRN Motley-Mangrum, [name], PMHNP   50 mg at 05/09/23 0834   haloperidol lactate (HALDOL) injection 10 mg  10 mg Intramuscular TID PRN Motley-Mangrum, [name], PMHNP   10 mg at 05/19/23 1242   And   diphenhydrAMINE (BENADRYL) injection 50 mg  50 mg Intramuscular TID PRN Motley-Mangrum, [name], PMHNP       And   LORazepam (ATIVAN) injection 2 mg  2 mg Intramuscular TID PRN Motley-Mangrum, [name], PMHNP       haloperidol lactate (HALDOL) injection 5 mg  5 mg Intramuscular TID PRN Motley-Mangrum, [name], PMHNP       And   diphenhydrAMINE (BENADRYL) injection 50 mg  50 mg Intramuscular TID PRN Motley-Mangrum, [name], PMHNP   50 mg at 05/19/23 1240   And   LORazepam (ATIVAN) injection 2 mg  2 mg Intramuscular TID PRN Motley-Mangrum, [name], PMHNP   2 mg at 05/19/23 1240   divalproex (DEPAKOTE ER) 24 hr tablet 500 mg  500 mg Oral BID Motley-Mangrum, [name], PMHNP   500 mg at 05/27/23 1726   gabapentin (NEURONTIN) capsule 400 mg  400 mg Oral BID Motley-Mangrum, [name], PMHNP   400 mg at 05/27/23 2140   hydrOXYzine (ATARAX) tablet 25 mg  25 mg Oral TID PRN Motley-Mangrum, [name], PMHNP   25 mg at 05/22/23 2059   LORazepam (ATIVAN) tablet 1 mg  1 mg Oral Q8H PRN Motley-Mangrum, [name], PMHNP   1 mg at 05/09/23 0833   magnesium hydroxide (MILK OF MAGNESIA) suspension 30 mL  30 mL Oral Daily PRN  Motley-Mangrum, [name], PMHNP       OLANZapine (ZYPREXA) tablet 15 mg  15 mg Oral QHS Lewanda Rife, MD   15 mg at 05/27/23 2140   traZODone (DESYREL) tablet 100 mg  100 mg Oral QHS PRN Motley-Mangrum, [name], PMHNP   100 mg at 05/26/23 2100   valbenazine ([name]) capsule 40 mg  40 mg Oral Daily [name], DO   40 mg at 05/27/23 3244    Lab Results: No results found for this or any previous visit (from the past 48 hours).  Blood Alcohol level:  Lab Results  Component Value Date   ETH <10 04/25/2023   ETH <10 12/31/2022    Metabolic Disorder Labs: Lab Results  Component Value Date   HGBA1C 5.5 12/31/2022   MPG 111.15 12/31/2022   MPG 134.11 12/29/2021   No results found for: "PROLACTIN" Lab Results  Component Value Date   CHOL 133 12/31/2022   TRIG 39 12/31/2022   HDL 54 12/31/2022   CHOLHDL 2.5 12/31/2022   VLDL 8 12/31/2022   LDLCALC 71 12/31/2022   LDLCALC 69 12/01/2022    Physical Findings: AIMS:  , ,  ,  ,    CIWA:    COWS:     Musculoskeletal: Strength & Muscle Tone: within normal limits Gait & Station: normal Patient leans: N/A  Psychiatric Specialty Exam:  Presentation  General Appearance:  Appropriate for Environment  Eye Contact: Good  Speech: Clear and Coherent  Speech Volume: Increased  Handedness: Right   Mood and Affect  Mood: Irritable  Affect: Congruent   Thought Process  Thought Processes: Coherent  Descriptions of Associations:Circumstantial  Orientation:Full (Time, Place and Person)  Thought Content:Logical  History of Schizophrenia/Schizoaffective disorder:Yes  Duration of Psychotic Symptoms:Greater than six months  Hallucinations:No data recorded  Ideas of Reference:None  Suicidal Thoughts:No data recorded  Homicidal Thoughts:No data recorded   Sensorium  Memory: Immediate Good; Recent Good; Remote Good  Judgment: Fair  Insight: Fair   Art therapist   Concentration: Good  Attention Span: Good  Recall: Good  Fund of Knowledge: Good  Language: Good   Psychomotor Activity  Psychomotor Activity: Normal   Assets  Assets: Communication Skills; Desire for Improvement   Sleep  Sleep: Good    Physical Exam: Physical Exam Vitals and nursing note reviewed.  HENT:     Head: Normocephalic and atraumatic.     Nose: Nose normal.  Eyes:     Extraocular Movements: Extraocular movements intact.  Pulmonary:     Effort: Pulmonary effort is normal.  Musculoskeletal:        General: Normal range of motion.     Cervical back: Normal range of motion.  Skin:    General: Skin is warm and dry.  Neurological:     Mental Status: He is alert and oriented to person, place, and time. Mental status is at baseline.  Psychiatric:        Mood and Affect: Mood normal.        Behavior: Behavior normal.        Thought Content: Thought content normal.        Judgment: Judgment normal.    Review of Systems  Neurological:  Positive for tremors.  All other systems reviewed and are negative.  Blood pressure 128/76, pulse 82, temperature 97.8 F (36.6 C), resp. rate 17, height 5\' 11"  (1.803 m), weight 71.2 kg, SpO2 99%. Body mass index is 21.9 kg/m.   Treatment Plan Summary: Daily contact with patient to assess and evaluate symptoms and progress in treatment and Medication management  Continue olanzapine 15 mg at bedtime for schizophrenia Continue Depakote 500 mg po BID for mood Continue [name] 40 mg p.o. daily for TD Continue Norvasc 5 mg by mouth daily for HTN   Valproic acid level : 60, ALT/AST 10/16   Satrina Magallanes E Mark Treinen, NP

## 2023-05-28 NOTE — Progress Notes (Cosign Needed Addendum)
[facility] MD Progress Note  05/28/2023 1300 Mark [name]  MRN:  [id number]  Subjective:  Mark [name] is a 63 year old African American male with a history of schizophrenia, HTN, and COPD who is here due to aggressive behavior toward his father. He has been living with father in [place]. Per father, there is an order of protection in place.  Chart reviewed, case discussed in multidisciplinary meeting, and patient seen on rounds. No acute changes or behavioral disturbances overnight. No evidence of acute psychosis. Patient asks for update regarding discharge planning. Social worker called father over the weekend to determine if patient receives SSI, but he has not answered calls.Social work assisting with disposition as patient is unable to return to father's home and is not appropriate for shelter.  Most recent HbgA1c 12/31/22, will order  Principal Problem: Paranoid schizophrenia (HCC) Diagnosis: Principal Problem:   Paranoid schizophrenia (HCC)  Total Time spent with patient: 20 minutes  Past Psychiatric History: see h&p  Past Medical History:  Past Medical History:  Diagnosis Date   COPD (chronic obstructive pulmonary disease) (HCC)    Hypertension    Schizophrenia (HCC)     Past Surgical History:  Procedure Laterality Date   LOWER EXTREMITY ANGIOGRAPHY N/A 09/18/2017   Procedure: LOWER EXTREMITY ANGIOGRAPHY- Recheck lysis;  Surgeon: Maeola Harman, MD;  Location: [facility] INVASIVE CV LAB;  Service: Cardiovascular;  Laterality: N/A;   PERIPHERAL VASCULAR INTERVENTION Left 09/18/2017   Procedure: PERIPHERAL VASCULAR INTERVENTION;  Surgeon: Maeola Harman, MD;  Location: [facility] INVASIVE CV LAB;  Service: Cardiovascular;  Laterality: Left;   THROMBECTOMY FEMORAL ARTERY Left 09/17/2017   Procedure: POSSIBLE THROMBECTOMY;  Surgeon: Maeola Harman, MD;  Location: [facility] OR;  Service: Vascular;  Laterality: Left;   VENOGRAM Left 09/17/2017   Procedure: ULTRASOUND  POPLITEAL ACCESS; CENTRAL VENOGRAM, IVVS, LYSIS CATHETER PLACEMENT;  Surgeon: Maeola Harman, MD;  Location: [facility] OR;  Service: Vascular;  Laterality: Left;   Family History: History reviewed. No pertinent family history. Family Psychiatric  History: see h&p Social History:  Social History   Substance and Sexual Activity  Alcohol Use No     Social History   Substance and Sexual Activity  Drug Use Never    Social History   Socioeconomic History   Marital status: Single    Spouse name: Not on file   Number of children: Not on file   Years of education: Not on file   Highest education level: Not on file  Occupational History   Not on file  Tobacco Use   Smoking status: Every Day    Current packs/day: 0.50    Types: Cigarettes   Smokeless tobacco: Never  Vaping Use   Vaping status: Never Used  Substance and Sexual Activity   Alcohol use: No   Drug use: Never   Sexual activity: Not Currently  Other Topics Concern   Not on file  Social History Narrative   Not on file   Social Drivers of Health   Financial Resource Strain: Not on file  Food Insecurity: No Food Insecurity (04/27/2023)   Hunger Vital Sign    Worried About Running Out of Food in the Last Year: Never true    Ran Out of Food in the Last Year: Never true  Transportation Needs: No Transportation Needs (04/27/2023)   PRAPARE - Administrator, Civil Service (Medical): No    Lack of Transportation (Non-Medical): No  Physical Activity: Not on file  Stress: Not on file  Social  Connections: Not on file   Additional Social History:                         Sleep: Good  Appetite:  Good  Current Medications: Current Facility-Administered Medications  Medication Dose Route Frequency Provider Last Rate Last Admin   acetaminophen (TYLENOL) tablet 650 mg  650 mg Oral Q6H PRN Motley-Mangrum, [name], PMHNP       alum & mag hydroxide-simeth (MAALOX/MYLANTA) 200-200-20 MG/5ML  suspension 30 mL  30 mL Oral Q4H PRN Motley-Mangrum, [name], PMHNP   30 mL at 05/26/23 1908   amantadine (SYMMETREL) capsule 100 mg  100 mg Oral BID Lewanda Rife, MD   100 mg at 05/28/23 0938   amLODipine (NORVASC) tablet 5 mg  5 mg Oral Daily Lewanda Rife, MD   5 mg at 05/28/23 8119   aspirin EC tablet 81 mg  81 mg Oral Daily Lewanda Rife, MD   81 mg at 05/28/23 0939   haloperidol (HALDOL) tablet 5 mg  5 mg Oral TID PRN Motley-Mangrum, [name], PMHNP   5 mg at 05/09/23 1478   And   diphenhydrAMINE (BENADRYL) capsule 50 mg  50 mg Oral TID PRN Motley-Mangrum, [name], PMHNP   50 mg at 05/09/23 0834   haloperidol lactate (HALDOL) injection 10 mg  10 mg Intramuscular TID PRN Motley-Mangrum, [name], PMHNP   10 mg at 05/19/23 1242   And   diphenhydrAMINE (BENADRYL) injection 50 mg  50 mg Intramuscular TID PRN Motley-Mangrum, [name], PMHNP       And   LORazepam (ATIVAN) injection 2 mg  2 mg Intramuscular TID PRN Motley-Mangrum, [name], PMHNP       haloperidol lactate (HALDOL) injection 5 mg  5 mg Intramuscular TID PRN Motley-Mangrum, [name], PMHNP       And   diphenhydrAMINE (BENADRYL) injection 50 mg  50 mg Intramuscular TID PRN Motley-Mangrum, [name], PMHNP   50 mg at 05/19/23 1240   And   LORazepam (ATIVAN) injection 2 mg  2 mg Intramuscular TID PRN Motley-Mangrum, [name], PMHNP   2 mg at 05/19/23 1240   divalproex (DEPAKOTE ER) 24 hr tablet 500 mg  500 mg Oral BID Motley-Mangrum, [name], PMHNP   500 mg at 05/28/23 0939   gabapentin (NEURONTIN) capsule 400 mg  400 mg Oral BID Motley-Mangrum, [name], PMHNP   400 mg at 05/28/23 0939   hydrOXYzine (ATARAX) tablet 25 mg  25 mg Oral TID PRN Motley-Mangrum, [name], PMHNP   25 mg at 05/22/23 2059   LORazepam (ATIVAN) tablet 1 mg  1 mg Oral Q8H PRN Motley-Mangrum, [name], PMHNP   1 mg at 05/09/23 0833   magnesium hydroxide (MILK OF MAGNESIA) suspension 30 mL  30 mL Oral Daily PRN Motley-Mangrum, [name], PMHNP        OLANZapine (ZYPREXA) tablet 15 mg  15 mg Oral QHS Lewanda Rife, MD   15 mg at 05/27/23 2140   traZODone (DESYREL) tablet 100 mg  100 mg Oral QHS PRN Motley-Mangrum, [name], PMHNP   100 mg at 05/26/23 2100   valbenazine ([name]) capsule 40 mg  40 mg Oral Daily [name], DO   40 mg at 05/28/23 2956    Lab Results: No results found for this or any previous visit (from the past 48 hours).  Blood Alcohol level:  Lab Results  Component Value Date   ETH <10 04/25/2023  ETH <10 12/31/2022    Metabolic Disorder Labs: Lab Results  Component Value Date   HGBA1C 5.5 12/31/2022   MPG 111.15 12/31/2022   MPG 134.11 12/29/2021   No results found for: "PROLACTIN" Lab Results  Component Value Date   CHOL 133 12/31/2022   TRIG 39 12/31/2022   HDL 54 12/31/2022   CHOLHDL 2.5 12/31/2022   VLDL 8 12/31/2022   LDLCALC 71 12/31/2022   LDLCALC 69 12/01/2022    Physical Findings: AIMS:  , ,  ,  ,    CIWA:    COWS:     Musculoskeletal: Strength & Muscle Tone: within normal limits Gait & Station: normal Patient leans: N/A  Psychiatric Specialty Exam:  Presentation  General Appearance:  Appropriate for Environment  Eye Contact: Good  Speech: Clear and Coherent  Speech Volume: Normal  Handedness: Right   Mood and Affect  Mood: Irritable  Affect: Congruent   Thought Process  Thought Processes: Coherent  Descriptions of Associations:Circumstantial  Orientation:Full (Time, Place and Person)  Thought Content:Logical  History of Schizophrenia/Schizoaffective disorder:Yes  Duration of Psychotic Symptoms:Greater than six months  Hallucinations:Denies  Ideas of Reference:None  Suicidal Thoughts:Denies  Homicidal Thoughts:Denies   Sensorium  Memory: Immediate Good; Recent Good; Remote Good  Judgment: Good  Insight: Good   Executive Functions  Concentration: Good  Attention Span: Good  Recall: Good  Fund of  Knowledge: Good  Language: Good   Psychomotor Activity  Psychomotor Activity: No data recorded   Assets  Assets: Communication Skills; Desire for Improvement   Sleep  Sleep: No data recorded    Physical Exam: Physical Exam Vitals and nursing note reviewed.  HENT:     Head: Normocephalic and atraumatic.     Nose: Nose normal.  Eyes:     Extraocular Movements: Extraocular movements intact.  Pulmonary:     Effort: Pulmonary effort is normal.  Musculoskeletal:        General: Normal range of motion.     Cervical back: Normal range of motion.  Skin:    General: Skin is warm and dry.  Neurological:     Mental Status: He is alert and oriented to person, place, and time. Mental status is at baseline.  Psychiatric:        Mood and Affect: Mood normal.        Behavior: Behavior normal.        Thought Content: Thought content normal.        Judgment: Judgment normal.    Review of Systems  All other systems reviewed and are negative.  Blood pressure 136/76, pulse 74, temperature 98.1 F (36.7 C), resp. rate 17, height 5\' 11"  (1.803 m), weight 71.2 kg, SpO2 100%. Body mass index is 21.9 kg/m.   Treatment Plan Summary: Daily contact with patient to assess and evaluate symptoms and progress in treatment and Medication management  Continue olanzapine 15 mg at bedtime for schizophrenia Continue Depakote 500 mg po BID for mood Continue [name] 40 mg p.o. daily for TD Continue Norvasc 5 mg by mouth daily for HTN   Valproic acid level : 60, ALT/AST 10/16, QTC 438 05/28/23: HgbA1c ordered  Roda Lauture Robyne Peers, NP

## 2023-05-28 NOTE — Progress Notes (Signed)
   05/27/23 2000  Psych Admission Type (Psych Patients Only)  Admission Status Involuntary  Psychosocial Assessment  Patient Complaints None  Eye Contact Brief  Facial Expression Flat  Affect Blunted  Speech Logical/coherent  Interaction Assertive  Motor Activity Pacing;Slow  Appearance/Hygiene Improved  Behavior Characteristics Cooperative;Appropriate to situation  Mood Apprehensive;Pleasant  Thought Process  Coherency WDL  Content Blaming others  Delusions None reported or observed  Perception WDL  Hallucination None reported or observed  Judgment WDL  Confusion WDL  Danger to Self  Current suicidal ideation? Denies  Danger to Others  Danger to Others None reported or observed   Patient  alert and oriented x 4, affect is congruent with mood no bizarre behavior interacting appropriately with peers and staff denies SI/HI/AVH .

## 2023-05-28 NOTE — BH IP Treatment Plan (Signed)
Interdisciplinary Treatment and Diagnostic Plan Update  05/28/2023 Time of Session: 1:55 pm Mark SMTIH MRN: [id number]  Principal Diagnosis: Paranoid schizophrenia [facility])  Secondary Diagnoses: Principal Problem:   Paranoid schizophrenia (HCC)   Current Medications:  Current Facility-Administered Medications  Medication Dose Route Frequency Provider Last Rate Last Admin   acetaminophen (TYLENOL) tablet 650 mg  650 mg Oral Q6H PRN Motley-Mangrum, [name], PMHNP       alum & mag hydroxide-simeth (MAALOX/MYLANTA) 200-200-20 MG/5ML suspension 30 mL  30 mL Oral Q4H PRN Motley-Mangrum, [name], PMHNP   30 mL at 05/26/23 1908   amantadine (SYMMETREL) capsule 100 mg  100 mg Oral BID Lewanda Rife, MD   100 mg at 05/28/23 0938   amLODipine (NORVASC) tablet 5 mg  5 mg Oral Daily Lewanda Rife, MD   5 mg at 05/28/23 6962   aspirin EC tablet 81 mg  81 mg Oral Daily Lewanda Rife, MD   81 mg at 05/28/23 0939   haloperidol (HALDOL) tablet 5 mg  5 mg Oral TID PRN Motley-Mangrum, [name], PMHNP   5 mg at 05/09/23 9528   And   diphenhydrAMINE (BENADRYL) capsule 50 mg  50 mg Oral TID PRN Motley-Mangrum, [name], PMHNP   50 mg at 05/09/23 0834   haloperidol lactate (HALDOL) injection 10 mg  10 mg Intramuscular TID PRN Motley-Mangrum, [name], PMHNP   10 mg at 05/19/23 1242   And   diphenhydrAMINE (BENADRYL) injection 50 mg  50 mg Intramuscular TID PRN Motley-Mangrum, [name], PMHNP       And   LORazepam (ATIVAN) injection 2 mg  2 mg Intramuscular TID PRN Motley-Mangrum, [name], PMHNP       haloperidol lactate (HALDOL) injection 5 mg  5 mg Intramuscular TID PRN Motley-Mangrum, [name], PMHNP       And   diphenhydrAMINE (BENADRYL) injection 50 mg  50 mg Intramuscular TID PRN Motley-Mangrum, [name], PMHNP   50 mg at 05/19/23 1240   And   LORazepam (ATIVAN) injection 2 mg  2 mg Intramuscular TID PRN Motley-Mangrum, [name], PMHNP   2 mg at 05/19/23 1240   divalproex (DEPAKOTE  ER) 24 hr tablet 500 mg  500 mg Oral BID Motley-Mangrum, [name], PMHNP   500 mg at 05/28/23 0939   gabapentin (NEURONTIN) capsule 400 mg  400 mg Oral BID Motley-Mangrum, [name], PMHNP   400 mg at 05/28/23 0939   hydrOXYzine (ATARAX) tablet 25 mg  25 mg Oral TID PRN Motley-Mangrum, [name], PMHNP   25 mg at 05/22/23 2059   LORazepam (ATIVAN) tablet 1 mg  1 mg Oral Q8H PRN Motley-Mangrum, [name], PMHNP   1 mg at 05/09/23 0833   magnesium hydroxide (MILK OF MAGNESIA) suspension 30 mL  30 mL Oral Daily PRN Motley-Mangrum, [name], PMHNP       OLANZapine (ZYPREXA) tablet 15 mg  15 mg Oral QHS Lewanda Rife, MD   15 mg at 05/27/23 2140   traZODone (DESYREL) tablet 100 mg  100 mg Oral QHS PRN Motley-Mangrum, [name], PMHNP   100 mg at 05/26/23 2100   valbenazine ([name]) capsule 40 mg  40 mg Oral Daily [name], DO   40 mg at 05/28/23 4132   PTA Medications: Medications Prior to Admission  Medication Sig Dispense Refill Last Dose/Taking   amantadine (SYMMETREL) 100 MG capsule Take 100 mg by mouth 2 (two) times daily.      amLODipine (NORVASC) 5 MG tablet  Take 1 tablet (5 mg total) by mouth daily.      aspirin EC 81 MG tablet Take 81 mg by mouth daily. Swallow whole.      benztropine (COGENTIN) 1 MG tablet Take 1 tablet (1 mg total) by mouth 2 (two) times daily. 60 tablet 0    cyclobenzaprine (FLEXERIL) 10 MG tablet Take 10 mg by mouth 2 (two) times daily as needed.      divalproex (DEPAKOTE ER) 500 MG 24 hr tablet Take 1 tablet (500 mg total) by mouth 2 (two) times daily. (Patient not taking: Reported on 04/25/2023) 60 tablet 0    gabapentin (NEURONTIN) 300 MG capsule Take 300 mg by mouth 3 (three) times daily. (Patient not taking: Reported on 04/25/2023)      gabapentin (NEURONTIN) 400 MG capsule Take 1 capsule (400 mg total) by mouth 3 (three) times daily. (Patient not taking: Reported on 04/25/2023) 90 capsule 0    haloperidol (HALDOL) 10 MG tablet Take 1 tablet (10 mg  total) by mouth 2 (two) times daily. (Patient not taking: Reported on 04/25/2023)      traZODone (DESYREL) 150 MG tablet Take 1 tablet (150 mg total) by mouth at bedtime. (Patient not taking: Reported on 04/25/2023) 30 tablet 0     Patient Stressors: Marital or family conflict   Medication change or noncompliance    Patient Strengths: Supportive family/friends   Treatment Modalities: Medication Management, Group therapy, Case management,  1 to 1 session with clinician, Psychoeducation, Recreational therapy.   Physician Treatment Plan for Primary Diagnosis: Paranoid schizophrenia (HCC) Long Term Goal(s): Improvement in symptoms so as ready for discharge   Short Term Goals: Ability to identify changes in lifestyle to reduce recurrence of condition will improve Ability to verbalize feelings will improve Ability to disclose and discuss suicidal ideas Ability to demonstrate self-control will improve Ability to identify and develop effective coping behaviors will improve Ability to maintain clinical measurements within normal limits will improve Compliance with prescribed medications will improve Ability to identify triggers associated with substance abuse/mental health issues will improve  Medication Management: Evaluate patient's response, side effects, and tolerance of medication regimen.  Therapeutic Interventions: 1 to 1 sessions, Unit Group sessions and Medication administration.  Evaluation of Outcomes: Progressing  Physician Treatment Plan for Secondary Diagnosis: Principal Problem:   Paranoid schizophrenia (HCC)  Long Term Goal(s): Improvement in symptoms so as ready for discharge   Short Term Goals: Ability to identify changes in lifestyle to reduce recurrence of condition will improve Ability to verbalize feelings will improve Ability to disclose and discuss suicidal ideas Ability to demonstrate self-control will improve Ability to identify and develop effective coping  behaviors will improve Ability to maintain clinical measurements within normal limits will improve Compliance with prescribed medications will improve Ability to identify triggers associated with substance abuse/mental health issues will improve     Medication Management: Evaluate patient's response, side effects, and tolerance of medication regimen.  Therapeutic Interventions: 1 to 1 sessions, Unit Group sessions and Medication administration.  Evaluation of Outcomes: Progressing   RN Treatment Plan for Primary Diagnosis: Paranoid schizophrenia (HCC) Long Term Goal(s): Knowledge of disease and therapeutic regimen to maintain health will improve  Short Term Goals: Ability to remain free from injury will improve, Ability to verbalize frustration and anger appropriately will improve, Ability to demonstrate self-control, Ability to participate in decision making will improve, Ability to verbalize feelings will improve, Ability to disclose and discuss suicidal ideas, Ability to identify and develop effective coping  behaviors will improve, and Compliance with prescribed medications will improve  Medication Management: RN will administer medications as ordered by provider, will assess and evaluate patient's response and provide education to patient for prescribed medication. RN will report any adverse and/or side effects to prescribing provider.  Therapeutic Interventions: 1 on 1 counseling sessions, Psychoeducation, Medication administration, Evaluate responses to treatment, Monitor vital signs and CBGs as ordered, Perform/monitor CIWA, COWS, AIMS and Fall Risk screenings as ordered, Perform wound care treatments as ordered.  Evaluation of Outcomes: Progressing   LCSW Treatment Plan for Primary Diagnosis: Paranoid schizophrenia (HCC) Long Term Goal(s): Safe transition to appropriate next level of care at discharge, Engage patient in therapeutic group addressing interpersonal concerns.  Short  Term Goals: Engage patient in aftercare planning with referrals and resources, Increase social support, Increase ability to appropriately verbalize feelings, Increase emotional regulation, Facilitate acceptance of mental health diagnosis and concerns, and Increase skills for wellness and recovery  Therapeutic Interventions: Assess for all discharge needs, 1 to 1 time with Social worker, Explore available resources and support systems, Assess for adequacy in community support network, Educate family and significant other(s) on suicide prevention, Complete Psychosocial Assessment, Interpersonal group therapy.  Evaluation of Outcomes: Progressing   Progress in Treatment: Attending groups: No. Participating in groups: No. Taking medication as prescribed: Yes. Toleration medication: Yes. Family/Significant other contact made: Yes, individual(s) contacted:  Austen Nakatani [phone]) Patient understands diagnosis: Yes. Discussing patient identified problems/goals with staff: Yes. Medical problems stabilized or resolved: Yes. Denies suicidal/homicidal ideation: Yes. Issues/concerns per patient self-inventory: No. Other: none  New problem(s) identified: No, Describe:  None identified Update 05/03/23: No changes at this time. Update 05/08/23: No changes at this time. Update 05/13/2023: No changes at this time. Update 05/18/2023: No changes at this time. Update 05/23/23: No changes at this time. Update 05/28/23: No changes at this time.   New Short Term/Long Term Goal(s):  elimination of symptoms of psychosis, medication management for mood stabilization; elimination of SI thoughts; development of comprehensive mental wellness plan. 05/03/23 Update: No changes at this time. Update 05/08/23: No changes at this time.   Update 05/13/2023: No changes at this time. Update 05/18/2023: No changes at this time. Update 05/23/23: No changes at this time. Update 05/28/23: No changes at this time.    Patient Goals:  "To stay out of trouble and move back home" 05/03/23 Update: No changes at this time. Update 05/08/23: No changes at this time.   Update 05/13/2023: No changes at this time. Update 05/18/2023: No changes at this time. Update 05/23/23: No changes at this time.  Update 05/28/23: No changes at this time.   Discharge Plan or Barriers: CSW will assist with appropriate discharge planning 05/03/23 Update: No changes at this time. Update 05/08/23: Pt's guardian reports that he cannot come back home due to restraining order. CSW will discuss with leadership next steps for safe discharge planning. Update 05/13/2023: No changes at this time. Update 05/18/2023: Pt awaiting care coordinator through [facility] to assist with placement. Update 05/23/23: No changes at this time.  Update 05/28/23: The patient does not have placement and does not want to go into a shelter. Waiting for a call back from the care coordinators and the patient dad.    Reason for Continuation of Hospitalization: Aggression Medication stabilization   Estimated Length of Stay: 1 to 7 days 05/03/23 Update: No changes at this time. Update 05/08/23: TBD  Update 05/13/2023: TBD Update 05/18/2023: TBD. Update 05/23/23: No changes at this time. Update  05/28/23: TBD  Last 3 [place] Suicide Severity Risk Score: Flowsheet Row Admission (Current) from 04/27/2023 in [facility] INPATIENT [facility] MEDICINE ED from 12/31/2022 in [facility] ED from 12/29/2022 in [facility] Emergency Department at [facility]  C-SSRS RISK CATEGORY No Risk No Risk No Risk       Last [facility] 2/9 Scores:     No data to display          Scribe for Treatment Team: Marshell Levan, [place] 05/28/2023 1:55 PM

## 2023-05-28 NOTE — Progress Notes (Signed)
[facility], [place] MD Progress Note  05/26/2023 1542 Mark [name]  MRN:  [id number]  Subjective:  Mark [name] is a 63 year old African American male with a history of schizophrenia, HTN, and COPD who is here due to aggressive behavior toward his father. He has been living with father in [place]. Per father, there is an order of protection in place.  Chart reviewed, case discussed in multidisciplinary meeting, and patient seen on rounds. [name] reports he is doing "fine" today. He denies thoughts of harming self and others. He denies perceptual disturbances. He is not observed responding to internal stimuli. [name] is visible in the milieu. He is adherent with medications. No acute events or behavioral disturbance. [name] is awaiting safe placement. Social work assisting with disposition as patient is unable to return to father's home and is not appropriate for shelter.  Principal Problem: Paranoid schizophrenia (HCC) Diagnosis: Principal Problem:   Paranoid schizophrenia (HCC)  Total Time spent with patient: 20 minutes  Past Psychiatric History: see h&p  Past Medical History:  Past Medical History:  Diagnosis Date   COPD (chronic obstructive pulmonary disease) (HCC)    Hypertension    Schizophrenia (HCC)     Past Surgical History:  Procedure Laterality Date   LOWER EXTREMITY ANGIOGRAPHY N/A 09/18/2017   Procedure: LOWER EXTREMITY ANGIOGRAPHY- Recheck lysis;  Surgeon: Maeola Harman, MD;  Location: [facility] INVASIVE CV LAB;  Service: Cardiovascular;  Laterality: N/A;   PERIPHERAL VASCULAR INTERVENTION Left 09/18/2017   Procedure: PERIPHERAL VASCULAR INTERVENTION;  Surgeon: Maeola Harman, MD;  Location: [facility] INVASIVE CV LAB;  Service: Cardiovascular;  Laterality: Left;   THROMBECTOMY FEMORAL ARTERY Left 09/17/2017   Procedure: POSSIBLE THROMBECTOMY;  Surgeon: Maeola Harman, MD;  Location: [facility] OR;  Service: Vascular;  Laterality: Left;   VENOGRAM Left 09/17/2017    Procedure: ULTRASOUND POPLITEAL ACCESS; CENTRAL VENOGRAM, IVVS, LYSIS CATHETER PLACEMENT;  Surgeon: Maeola Harman, MD;  Location: [facility] OR;  Service: Vascular;  Laterality: Left;   Family History: History reviewed. No pertinent family history. Family Psychiatric  History: see h&p Social History:  Social History   Substance and Sexual Activity  Alcohol Use No     Social History   Substance and Sexual Activity  Drug Use Never    Social History   Socioeconomic History   Marital status: Single    Spouse name: Not on file   Number of children: Not on file   Years of education: Not on file   Highest education level: Not on file  Occupational History   Not on file  Tobacco Use   Smoking status: Every Day    Current packs/day: 0.50    Types: Cigarettes   Smokeless tobacco: Never  Vaping Use   Vaping status: Never Used  Substance and Sexual Activity   Alcohol use: No   Drug use: Never   Sexual activity: Not Currently  Other Topics Concern   Not on file  Social History Narrative   Not on file   Social Drivers of Health   Financial Resource Strain: Not on file  Food Insecurity: No Food Insecurity (04/27/2023)   Hunger Vital Sign    Worried About Running Out of Food in the Last Year: Never true    Ran Out of Food in the Last Year: Never true  Transportation Needs: No Transportation Needs (04/27/2023)   PRAPARE - Administrator, Civil Service (Medical): No    Lack of Transportation (Non-Medical): No  Physical Activity: Not on file  Stress:  Not on file  Social Connections: Not on file   Additional Social History:                         Sleep: Good  Appetite:  Good  Current Medications: Current Facility-Administered Medications  Medication Dose Route Frequency Provider Last Rate Last Admin   acetaminophen (TYLENOL) tablet 650 mg  650 mg Oral Q6H PRN Motley-Mangrum, [name], PMHNP       alum & mag hydroxide-simeth (MAALOX/MYLANTA)  200-200-20 MG/5ML suspension 30 mL  30 mL Oral Q4H PRN Motley-Mangrum, [name], PMHNP   30 mL at 05/26/23 1908   amantadine (SYMMETREL) capsule 100 mg  100 mg Oral BID Lewanda Rife, MD   100 mg at 05/27/23 2141   amLODipine (NORVASC) tablet 5 mg  5 mg Oral Daily Lewanda Rife, MD   5 mg at 05/27/23 0919   aspirin EC tablet 81 mg  81 mg Oral Daily Lewanda Rife, MD   81 mg at 05/27/23 0919   haloperidol (HALDOL) tablet 5 mg  5 mg Oral TID PRN Motley-Mangrum, [name], PMHNP   5 mg at 05/09/23 9147   And   diphenhydrAMINE (BENADRYL) capsule 50 mg  50 mg Oral TID PRN Motley-Mangrum, [name], PMHNP   50 mg at 05/09/23 0834   haloperidol lactate (HALDOL) injection 10 mg  10 mg Intramuscular TID PRN Motley-Mangrum, [name], PMHNP   10 mg at 05/19/23 1242   And   diphenhydrAMINE (BENADRYL) injection 50 mg  50 mg Intramuscular TID PRN Motley-Mangrum, [name], PMHNP       And   LORazepam (ATIVAN) injection 2 mg  2 mg Intramuscular TID PRN Motley-Mangrum, [name], PMHNP       haloperidol lactate (HALDOL) injection 5 mg  5 mg Intramuscular TID PRN Motley-Mangrum, [name], PMHNP       And   diphenhydrAMINE (BENADRYL) injection 50 mg  50 mg Intramuscular TID PRN Motley-Mangrum, [name], PMHNP   50 mg at 05/19/23 1240   And   LORazepam (ATIVAN) injection 2 mg  2 mg Intramuscular TID PRN Motley-Mangrum, [name], PMHNP   2 mg at 05/19/23 1240   divalproex (DEPAKOTE ER) 24 hr tablet 500 mg  500 mg Oral BID Motley-Mangrum, [name], PMHNP   500 mg at 05/27/23 1726   gabapentin (NEURONTIN) capsule 400 mg  400 mg Oral BID Motley-Mangrum, [name], PMHNP   400 mg at 05/27/23 2140   hydrOXYzine (ATARAX) tablet 25 mg  25 mg Oral TID PRN Motley-Mangrum, [name], PMHNP   25 mg at 05/22/23 2059   LORazepam (ATIVAN) tablet 1 mg  1 mg Oral Q8H PRN Motley-Mangrum, [name], PMHNP   1 mg at 05/09/23 0833   magnesium hydroxide (MILK OF MAGNESIA) suspension 30 mL  30 mL Oral Daily PRN Motley-Mangrum,  [name], PMHNP       OLANZapine (ZYPREXA) tablet 15 mg  15 mg Oral QHS Lewanda Rife, MD   15 mg at 05/27/23 2140   traZODone (DESYREL) tablet 100 mg  100 mg Oral QHS PRN Motley-Mangrum, [name], PMHNP   100 mg at 05/26/23 2100   valbenazine ([name]) capsule 40 mg  40 mg Oral Daily [name], DO   40 mg at 05/27/23 8295    Lab Results: No results found for this or any previous visit (from the past 48 hours).  Blood Alcohol level:  Lab Results  Component Value Date  ETH <10 04/25/2023   ETH <10 12/31/2022    Metabolic Disorder Labs: Lab Results  Component Value Date   HGBA1C 5.5 12/31/2022   MPG 111.15 12/31/2022   MPG 134.11 12/29/2021   No results found for: "PROLACTIN" Lab Results  Component Value Date   CHOL 133 12/31/2022   TRIG 39 12/31/2022   HDL 54 12/31/2022   CHOLHDL 2.5 12/31/2022   VLDL 8 12/31/2022   LDLCALC 71 12/31/2022   LDLCALC 69 12/01/2022    Physical Findings: AIMS:  , ,  ,  ,    CIWA:    COWS:     Musculoskeletal: Strength & Muscle Tone: within normal limits Gait & Station: normal Patient leans: N/A  Psychiatric Specialty Exam:  Presentation  General Appearance:  Appropriate for Environment  Eye Contact: Good  Speech: Clear and Coherent  Speech Volume: Normal  Handedness: Right   Mood and Affect  Mood: Irritable  Affect: Congruent   Thought Process  Thought Processes: Coherent  Descriptions of Associations:Circumstantial  Orientation:Full (Time, Place and Person)  Thought Content:Logical  History of Schizophrenia/Schizoaffective disorder:Yes  Duration of Psychotic Symptoms:Greater than six months  Hallucinations:Denies  Ideas of Reference:None  Suicidal Thoughts:Denies  Homicidal Thoughts:Denies   Sensorium  Memory: Immediate Good; Recent Good; Remote Good  Judgment: Good  Insight: Good   Executive Functions  Concentration: Good  Attention  Span: Good  Recall: Good  Fund of Knowledge: Good  Language: Good   Psychomotor Activity  Psychomotor Activity: No data recorded   Assets  Assets: Communication Skills; Desire for Improvement   Sleep  Sleep: No data recorded    Physical Exam: Physical Exam Vitals and nursing note reviewed.  HENT:     Head: Normocephalic and atraumatic.     Nose: Nose normal.  Eyes:     Extraocular Movements: Extraocular movements intact.  Pulmonary:     Effort: Pulmonary effort is normal.  Musculoskeletal:        General: Normal range of motion.     Cervical back: Normal range of motion.  Skin:    General: Skin is warm and dry.  Neurological:     Mental Status: He is alert and oriented to person, place, and time. Mental status is at baseline.  Psychiatric:        Mood and Affect: Mood normal.        Behavior: Behavior normal.        Thought Content: Thought content normal.        Judgment: Judgment normal.    Review of Systems  All other systems reviewed and are negative.  Blood pressure 128/76, pulse 82, temperature 97.8 F (36.6 C), resp. rate 17, height 5\' 11"  (1.803 m), weight 71.2 kg, SpO2 99%. Body mass index is 21.9 kg/m.   Treatment Plan Summary: Daily contact with patient to assess and evaluate symptoms and progress in treatment and Medication management  Continue olanzapine 15 mg at bedtime for schizophrenia Continue Depakote 500 mg po BID for mood Continue [name] 40 mg p.o. daily for TD Continue Norvasc 5 mg by mouth daily for HTN   Valproic acid level : 60, ALT/AST 10/16   Aleka Twitty E Frantz Quattrone, NP

## 2023-05-28 NOTE — Group Note (Signed)
Date:  05/28/2023 Time:  9:35 PM  Group Topic/Focus:  Healthy Communication:   The focus of this group is to discuss communication, barriers to communication, as well as healthy ways to communicate with others.    Participation Level:  Did Not Attend  Participation Quality:   none  Affect:   none  Cognitive:   none  Insight: None  Engagement in Group:   none  Modes of Intervention:   none  Additional Comments:  none   Chaske Paskett 05/28/2023, 9:35 PM

## 2023-05-28 NOTE — BHH Counselor (Signed)
The LCSWA attempted to contact the patient father to inquire about the patient income. There was no answer, will attempt to call back at later time.     Mark [name], MSW, [employer]

## 2023-05-28 NOTE — Plan of Care (Signed)
  Problem: Education: Goal: Verbalization of understanding the information provided will improve Outcome: Progressing   Problem: Education: Goal: Mental status will improve Outcome: Progressing

## 2023-05-28 NOTE — Plan of Care (Signed)
  Problem: Education: Goal: Emotional status will improve Outcome: Progressing Goal: Mental status will improve Outcome: Progressing   Problem: Education: Goal: Emotional status will improve Outcome: Progressing Goal: Mental status will improve Outcome: Progressing   Problem: Coping: Goal: Ability to verbalize frustrations and anger appropriately will improve Outcome: Progressing   Problem: Safety: Goal: Periods of time without injury will increase Outcome: Progressing

## 2023-05-28 NOTE — Progress Notes (Signed)
Pt was calm and cooperative, compliant with medications.  Pt denies SI HI AVH.  Continued monitoring for safety.  05/28/23 1000  Psych Admission Type (Psych Patients Only)  Admission Status Involuntary  Psychosocial Assessment  Patient Complaints None  Eye Contact Brief  Facial Expression Flat  Affect Blunted  Speech Logical/coherent  Interaction Assertive  Motor Activity Pacing  Appearance/Hygiene Improved  Behavior Characteristics Cooperative  Mood Pleasant  Thought Process  Coherency WDL  Content WDL  Delusions None reported or observed  Perception WDL  Hallucination None reported or observed  Judgment WDL  Confusion WDL  Danger to Self  Current suicidal ideation? Denies  Danger to Others  Danger to Others None reported or observed

## 2023-05-28 NOTE — Progress Notes (Signed)
Patient is pleasant and cooperative. Denies SI, HI, AVH. Medication compliant. Appropriate with staff and peers. Minimal interaction with others. Continues to walk halls at time for exercise. Denies SI, HI, AVH.  Encouragement and support provided. Safety checks maintained. Medications given as prescribed. Pt receptive and remains safe on unit with q 15 min checks.

## 2023-05-29 DIAGNOSIS — F200 Paranoid schizophrenia: Secondary | ICD-10-CM | POA: Diagnosis not present

## 2023-05-29 NOTE — Group Note (Signed)
Date:  05/29/2023 Time:  8:45 PM  Group Topic/Focus:  Wellness Toolbox:   The focus of this group is to discuss various aspects of wellness, balancing those aspects and exploring ways to increase the ability to experience wellness.  Patients will create a wellness toolbox for use upon discharge. Wrap-Up Group:   The focus of this group is to help patients review their daily goal of treatment and discuss progress on daily workbooks.    Participation Level:  Did Not Attend    Katina Dung 05/29/2023, 8:45 PM

## 2023-05-29 NOTE — Group Note (Signed)
Date:  05/29/2023 Time:  10:17 AM  Group Topic/Focus:  Goals Group:   The focus of this group is to help patients establish daily goals to achieve during treatment and discuss how the patient can incorporate goal setting into their daily lives to aide in recovery. Orientation:   The focus of this group is to educate the patient on the purpose and policies of crisis stabilization and provide a format to answer questions about their admission.  The group details unit policies and expectations of patients while admitted. Pop QUIZ: Mark [name]    Participation Level:  Did Not Attend   [name] 05/29/2023, 10:17 AM

## 2023-05-29 NOTE — Group Note (Signed)
Recreation Therapy Group Note   Group Topic:Health and Wellness  Group Date: 05/29/2023 Start Time: 1000 End Time: 1050 Facilitators: Rosina Lowenstein, LRT, CTRS Location:  Craft Room  Activity Description/Intervention: Therapeutic Drumming. Patients with peers and staff were given the opportunity to engage in a leader facilitated [facility] Group Empowerment Drumming Circle with staff from the [employer], in partnership with [employer]. Teaching laboratory technician and trained [employer], Theodoro Doing leading with LRT observing and documenting intervention and pt response. This evidenced-based practice targets 7 areas of health and wellbeing in the human experience including: stress-reduction, exercise, self-expression, camaraderie/support, nurturing, spirituality, and music-making (leisure).   Goal Area(s) Addresses:  Patient will engage in pro-social way in music group.  Patient will follow directions of drum leader on the first prompt. Patient will demonstrate no behavioral issues during group.  Patient will identify if a reduction in stress level occurs as a result of participation in therapeutic drum circle.    Education: Leisure exposure, Pharmacologist, Musical expression, Discharge Planning   Affect/Mood: N/A   Participation Level: Did not attend     Plan: Continue to engage patient in RT group sessions 2-3x/week.   Rosina Lowenstein, LRT, CTRS 05/29/2023 12:31 PM

## 2023-05-29 NOTE — Progress Notes (Signed)
[facility] MD Progress Note  05/29/2023 7:38 PM Mark [name]  MRN:  [id number] Principal Problem: Paranoid schizophrenia [facility]) Diagnosis: Principal Problem:   Paranoid schizophrenia (HCC)  Reason for admission: Mark [name] is a 63 year old African-American male who is involuntarily admitted to psychiatry for aggressive behavior towards his parents. He lives with mom and dad in [place]. He has a long history of schizophrenia. He is a poor historian but pleasant and cooperative. It is noted that he does have an upper extremity tremor bilaterally. Notes reviewed states that he has been noncompliant with his medications. I believe he follows up at [facility] outpatient. He states that he does get a long-acting injection but does not know the name of it. He has been in jail in the past for assault. He denies any alcohol or drug use. Recent outpatient medications include amantadine, Norvasc, aspirin, Cogentin, Flexeril, Depakote, Neurontin, Haldol, trazodone. His biggest complaint is his tremors. He denies being suicidal or homicidal. He denies any psychosis. He has been pleasant and cooperative throughout the interview. He has given social work permission to talk to his father.   Yesterday the psychiatry team made the following recommendations:   On assessment today, the pt reports that their mood is *** Reports that anxiety is *** Sleep is *** Appetite is *** Concentration is ***  Energy level is *** *** suicidal thoughts. *** suicidal intent and plan.  Denies having any HI.  Denies having psychotic symptoms.   Denies having side effects to current psychiatric medications.   We discussed changes to current medication regimen, including ***  Discussed the following psychosocial stressors: ***    Total Time spent with patient: 45 minutes  Past Psychiatric History: Past admission in February 2023, admission and discharge notes were reviewed, per discharge summary patient was discharged on Depakote  500 mg twice daily, Cogentin 1 mg twice daily, Haldol 5 mg twice daily and Neurontin 400 mg 3 times daily also clozapine was discontinued during that hospitalization, aims was noted to be negative at time of discharge with no sign of EPS. Past Psychotropic medications included clozapine, Depakote, Haldol, Cogentin, Inderal, trazodone and Vistaril. Prior Psychiatric diagnoses: Schizophrenia   Past Medical History:  Past Medical History:  Diagnosis Date   COPD (chronic obstructive pulmonary disease) (HCC)    Hypertension    Schizophrenia (HCC)     Past Surgical History:  Procedure Laterality Date   LOWER EXTREMITY ANGIOGRAPHY N/A 09/18/2017   Procedure: LOWER EXTREMITY ANGIOGRAPHY- Recheck lysis;  Surgeon: Maeola Harman, MD;  Location: [facility] INVASIVE CV LAB;  Service: Cardiovascular;  Laterality: N/A;   PERIPHERAL VASCULAR INTERVENTION Left 09/18/2017   Procedure: PERIPHERAL VASCULAR INTERVENTION;  Surgeon: Maeola Harman, MD;  Location: [facility] INVASIVE CV LAB;  Service: Cardiovascular;  Laterality: Left;   THROMBECTOMY FEMORAL ARTERY Left 09/17/2017   Procedure: POSSIBLE THROMBECTOMY;  Surgeon: Maeola Harman, MD;  Location: [facility] OR;  Service: Vascular;  Laterality: Left;   VENOGRAM Left 09/17/2017   Procedure: ULTRASOUND POPLITEAL ACCESS; CENTRAL VENOGRAM, IVVS, LYSIS CATHETER PLACEMENT;  Surgeon: Maeola Harman, MD;  Location: [facility] OR;  Service: Vascular;  Laterality: Left;   Family History: History reviewed. No pertinent family history. Family Psychiatric  History: See H&P Social History:  Social History   Substance and Sexual Activity  Alcohol Use No     Social History   Substance and Sexual Activity  Drug Use Never    Social History   Socioeconomic History   Marital status: Single    Spouse name:  Not on file   Number of children: Not on file   Years of education: Not on file   Highest education level: Not on file  Occupational History   Not  on file  Tobacco Use   Smoking status: Every Day    Current packs/day: 0.50    Types: Cigarettes   Smokeless tobacco: Never  Vaping Use   Vaping status: Never Used  Substance and Sexual Activity   Alcohol use: No   Drug use: Never   Sexual activity: Not Currently  Other Topics Concern   Not on file  Social History Narrative   Not on file   Social Drivers of Health   Financial Resource Strain: Not on file  Food Insecurity: No Food Insecurity (04/27/2023)   Hunger Vital Sign    Worried About Running Out of Food in the Last Year: Never true    Ran Out of Food in the Last Year: Never true  Transportation Needs: No Transportation Needs (04/27/2023)   PRAPARE - Administrator, Civil Service (Medical): No    Lack of Transportation (Non-Medical): No  Physical Activity: Not on file  Stress: Not on file  Social Connections: Not on file   Additional Social History:    Sleep: Good  Appetite:  Good  Current Medications: Current Facility-Administered Medications  Medication Dose Route Frequency Provider Last Rate Last Admin   acetaminophen (TYLENOL) tablet 650 mg  650 mg Oral Q6H PRN Motley-Mangrum, [name], PMHNP       alum & mag hydroxide-simeth (MAALOX/MYLANTA) 200-200-20 MG/5ML suspension 30 mL  30 mL Oral Q4H PRN Motley-Mangrum, [name], PMHNP   30 mL at 05/26/23 1908   amantadine (SYMMETREL) capsule 100 mg  100 mg Oral BID Lewanda Rife, MD   100 mg at 05/29/23 0854   amLODipine (NORVASC) tablet 5 mg  5 mg Oral Daily Lewanda Rife, MD   5 mg at 05/29/23 0981   aspirin EC tablet 81 mg  81 mg Oral Daily Lewanda Rife, MD   81 mg at 05/29/23 0853   haloperidol (HALDOL) tablet 5 mg  5 mg Oral TID PRN Motley-Mangrum, [name], PMHNP   5 mg at 05/09/23 1914   And   diphenhydrAMINE (BENADRYL) capsule 50 mg  50 mg Oral TID PRN Motley-Mangrum, [name], PMHNP   50 mg at 05/09/23 0834   haloperidol lactate (HALDOL) injection 10 mg  10 mg Intramuscular TID PRN  Motley-Mangrum, [name], PMHNP   10 mg at 05/19/23 1242   And   diphenhydrAMINE (BENADRYL) injection 50 mg  50 mg Intramuscular TID PRN Motley-Mangrum, [name], PMHNP       And   LORazepam (ATIVAN) injection 2 mg  2 mg Intramuscular TID PRN Motley-Mangrum, [name], PMHNP       haloperidol lactate (HALDOL) injection 5 mg  5 mg Intramuscular TID PRN Motley-Mangrum, [name], PMHNP       And   diphenhydrAMINE (BENADRYL) injection 50 mg  50 mg Intramuscular TID PRN Motley-Mangrum, [name], PMHNP   50 mg at 05/19/23 1240   And   LORazepam (ATIVAN) injection 2 mg  2 mg Intramuscular TID PRN Motley-Mangrum, [name], PMHNP   2 mg at 05/19/23 1240   divalproex (DEPAKOTE ER) 24 hr tablet 500 mg  500 mg Oral BID Motley-Mangrum, [name], PMHNP   500 mg at 05/29/23 1725   gabapentin (NEURONTIN) capsule 400 mg  400 mg Oral BID Motley-Mangrum, Geralynn Ochs A, PMHNP   400  mg at 05/29/23 0853   hydrOXYzine (ATARAX) tablet 25 mg  25 mg Oral TID PRN Motley-Mangrum, [name], PMHNP   25 mg at 05/22/23 2059   LORazepam (ATIVAN) tablet 1 mg  1 mg Oral Q8H PRN Motley-Mangrum, [name], PMHNP   1 mg at 05/09/23 0833   magnesium hydroxide (MILK OF MAGNESIA) suspension 30 mL  30 mL Oral Daily PRN Motley-Mangrum, [name], PMHNP       OLANZapine (ZYPREXA) tablet 15 mg  15 mg Oral QHS Lewanda Rife, MD   15 mg at 05/28/23 2109   traZODone (DESYREL) tablet 100 mg  100 mg Oral QHS PRN Motley-Mangrum, [name], PMHNP   100 mg at 05/28/23 2109   valbenazine ([name]) capsule 40 mg  40 mg Oral Daily [name], DO   40 mg at 05/29/23 8295   Lab Results:  Results for orders placed or performed during the hospital encounter of 04/27/23 (from the past 48 hours)  Hemoglobin A1c     Status: Abnormal   Collection Time: 05/28/23  6:49 PM  Result Value Ref Range   Hgb A1c MFr Bld 5.8 (H) 4.8 - 5.6 %    Comment: (NOTE) Pre diabetes:          5.7%-6.4%  Diabetes:              >6.4%  Glycemic control for    <7.0% adults with diabetes    Mean Plasma Glucose 119.76 mg/dL    Comment: Performed at [facility], [address], [place], [place] [address]   Blood Alcohol level:  Lab Results  Component Value Date   [facility] <10 04/25/2023   ETH <10 12/31/2022   Metabolic Disorder Labs: Lab Results  Component Value Date   HGBA1C 5.8 (H) 05/28/2023   MPG 119.76 05/28/2023   MPG 111.15 12/31/2022   No results found for: "PROLACTIN" Lab Results  Component Value Date   CHOL 133 12/31/2022   TRIG 39 12/31/2022   HDL 54 12/31/2022   CHOLHDL 2.5 12/31/2022   VLDL 8 12/31/2022   LDLCALC 71 12/31/2022   LDLCALC 69 12/01/2022   Physical Findings: AIMS:  , ,  ,  ,    CIWA:    COWS:     Musculoskeletal: Strength & Muscle Tone: within normal limits Gait & Station: normal Patient leans: N/A  Psychiatric Specialty Exam:  Presentation  General Appearance:  Appropriate for Environment; Casual; Fairly Groomed  Eye Contact: Good  Speech: Clear and Coherent; Normal Rate  Speech Volume: Normal  Handedness: Right  Mood and Affect  Mood: Euthymic  Affect: Appropriate; Congruent  Thought Process  Thought Processes: Coherent  Descriptions of Associations:Intact  Orientation:Full (Time, Place and Person)  Thought Content:Logical  History of Schizophrenia/Schizoaffective disorder:Yes  Duration of Psychotic Symptoms:Greater than six months  Hallucinations:Hallucinations: None  Ideas of Reference:None  Suicidal Thoughts:Suicidal Thoughts: No  Homicidal Thoughts:Homicidal Thoughts: No  Sensorium  Memory: Immediate Good; Recent Good  Judgment: Fair  Insight: Fair  Art therapist  Concentration: Good  Attention Span: Good  Recall: Good  Fund of Knowledge: Good  Language: Good  Psychomotor Activity  Psychomotor Activity: Psychomotor Activity: Normal  Assets  Assets: Communication Skills; Desire for Improvement; Physical Health;  Resilience; Social Support  Sleep  Sleep: Sleep: Fair Number of Hours of Sleep: 4  Physical Exam: Physical Exam Vitals and nursing note reviewed.  HENT:     Head: Normocephalic.     Nose: Nose normal.  Mouth/Throat:     Mouth: Mucous membranes are moist.     Pharynx: Oropharynx is clear.  Eyes:     Extraocular Movements: Extraocular movements intact.  Cardiovascular:     Rate and Rhythm: Normal rate.     Pulses: Normal pulses.  Pulmonary:     Effort: Pulmonary effort is normal.  Abdominal:     Comments: Deferred  Genitourinary:    Comments: Deferred Musculoskeletal:        General: Normal range of motion.     Cervical back: Normal range of motion.  Skin:    General: Skin is warm.  Neurological:     General: No focal deficit present.     Mental Status: He is alert and oriented to person, place, and time.  Psychiatric:        Mood and Affect: Mood normal.        Behavior: Behavior normal.    Review of Systems  Constitutional:  Negative for chills and fever.  HENT:  Negative for sore throat.   Eyes:  Negative for blurred vision.  Respiratory:  Negative for cough, sputum production, shortness of breath and wheezing.   Cardiovascular:  Negative for chest pain and palpitations.  Gastrointestinal:  Negative for abdominal pain, constipation, diarrhea, heartburn, nausea and vomiting.  Genitourinary:  Negative for dysuria, frequency and urgency.  Musculoskeletal: Negative.   Skin:  Negative for itching and rash.  Neurological:  Negative for dizziness, tingling and headaches.  Endo/Heme/Allergies:        See allergy listing  Psychiatric/Behavioral:  Positive for depression. The patient is nervous/anxious.    Blood pressure (!) 146/73, pulse 72, temperature 98.4 F (36.9 C), resp. rate 16, height 5\' 11"  (1.803 m), weight 71.2 kg, SpO2 100%. Body mass index is 21.9 kg/m.   Treatment Plan Summary: Daily contact with patient to assess and evaluate symptoms and  progress in treatment and Medication management Plan see orders   Physician Treatment Plan for Primary Diagnosis: Paranoid schizophrenia (HCC) -- Continue amantadine capsule 100 mg p.o. twice daily --Continue Depakote ER 24 hours 500 mg p.o. twice daily for mood stabilization --Continue gabapentin capsule 400 mg p.o. twice daily for anxiety --Continue Ativan tablet 1 mg p.o. every 8 hours as needed for anxiety --Continue Zyprexa 15 mg p.o. nightly for psychosis --Continue trazodone 100 mg p.o. nightly as needed for insomnia --Continue [name] capsule 40 mg p.o. daily for EPS  Long Term Goal(s): Improvement in symptoms so as ready for discharge   Short Term Goals: Ability to identify changes in lifestyle to reduce recurrence of condition will improve, Ability to verbalize feelings will improve, Ability to disclose and discuss suicidal ideas, Ability to demonstrate self-control will improve, Ability to identify and develop effective coping behaviors will improve, Ability to maintain clinical measurements within normal limits will improve, Compliance with prescribed medications will improve, and Ability to identify triggers associated with substance abuse/mental health issues will improve   Physician Treatment Plan for Secondary Diagnosis: Principal Problem:   Paranoid schizophrenia (HCC)     I certify that inpatient services furnished can reasonably be expected to improve the patient's condition.     [name], DO 12/20/202410:29 AM          Cecilie Lowers, FNP 05/29/2023, 7:38 PM

## 2023-05-29 NOTE — Progress Notes (Signed)
   05/29/23 0900  Psych Admission Type (Psych Patients Only)  Admission Status Involuntary  Psychosocial Assessment  Patient Complaints None  Eye Contact Brief  Facial Expression Flat  Affect Appropriate to circumstance  Speech Logical/coherent  Interaction Assertive  Motor Activity Tremors  Appearance/Hygiene In scrubs  Behavior Characteristics Cooperative  Mood Pleasant  Thought Process  Coherency WDL  Content WDL  Delusions None reported or observed  Perception WDL  Hallucination None reported or observed  Judgment WDL  Confusion WDL  Danger to Self  Current suicidal ideation? Denies  Agreement Not to Harm Self Yes  Description of Agreement verbal  Danger to Others  Danger to Others None reported or observed

## 2023-05-29 NOTE — Plan of Care (Signed)
  Problem: Education: Goal: Knowledge of [place] [facility] Education information/materials will improve Outcome: Progressing Goal: Emotional status will improve Outcome: Progressing Goal: Mental status will improve Outcome: Progressing Goal: Verbalization of understanding the information provided will improve Outcome: Progressing   Problem: Education: Goal: Emotional status will improve Outcome: Progressing Goal: Mental status will improve Outcome: Progressing   Problem: Coping: Goal: Ability to verbalize frustrations and anger appropriately will improve Outcome: Progressing Goal: Ability to demonstrate self-control will improve Outcome: Progressing   Problem: Physical Regulation: Goal: Ability to maintain clinical measurements within normal limits will improve Outcome: Progressing   Problem: Safety: Goal: Periods of time without injury will increase Outcome: Progressing   Problem: Education: Goal: Knowledge of General Education information will improve Description: Including pain rating scale, medication(s)/side effects and non-pharmacologic comfort measures Outcome: Progressing

## 2023-05-29 NOTE — Group Note (Signed)
Date:  05/29/2023 Time:  12:40 PM  Group Topic/Focus:  Rediscovering Joy:   The focus of this group is to explore various ways to relieve stress in a positive manner. Art group therapy    Participation Level:  Minimal  Participation Quality:  Attentive  Affect:  Appropriate  Cognitive:  Alert  Insight: Appropriate  Engagement in Group:  Engaged  Modes of Intervention:  Activity  Additional Comments:    Donalda Job 05/29/2023, 12:40 PM

## 2023-05-29 NOTE — Group Note (Signed)
LCSW Group Therapy Note   Group Date: 05/29/2023 Start Time: 1400 End Time: 1505   Type of Therapy and Topic:  Group Therapy: Challenging Core Beliefs  Participation Level:  Did Not Attend  Description of Group:  Patients were educated about core beliefs and asked to identify one harmful core belief that they have. Patients were asked to explore from where those beliefs originate. Patients were asked to discuss how those beliefs make them feel and the resulting behaviors of those beliefs. They were then be asked if those beliefs are true and, if so, what evidence they have to support them. Lastly, group members were challenged to replace those negative core beliefs with helpful beliefs.   Therapeutic Goals:   1. Patient will identify harmful core beliefs and explore the origins of such beliefs. 2. Patient will identify feelings and behaviors that result from those core beliefs. 3. Patient will discuss whether such beliefs are true. 4.  Patient will replace harmful core beliefs with helpful ones.  Summary of Patient Progress:  Patient did not attend group.   Therapeutic Modalities: Cognitive Behavioral Therapy; Solution-Focused Therapy   Lowry Ram, LCSWA 05/29/2023  3:08 PM

## 2023-05-30 ENCOUNTER — Encounter: Payer: Self-pay | Admitting: Psychiatry

## 2023-05-30 DIAGNOSIS — F200 Paranoid schizophrenia: Secondary | ICD-10-CM | POA: Diagnosis not present

## 2023-05-30 NOTE — Group Note (Signed)
Date:  05/30/2023 Time:  9:36 PM  Group Topic/Focus:  Making Healthy Choices:   The focus of this group is to help patients identify negative/unhealthy choices they were using prior to admission and identify positive/healthier coping strategies to replace them upon discharge. Managing Feelings:   The focus of this group is to identify what feelings patients have difficulty handling and develop a plan to handle them in a healthier way upon discharge. Orientation:   The focus of this group is to educate the patient on the purpose and policies of crisis stabilization and provide a format to answer questions about their admission.  The group details unit policies and expectations of patients while admitted. Rediscovering Joy:   The focus of this group is to explore various ways to relieve stress in a positive manner. Wrap-Up Group:   The focus of this group is to help patients review their daily goal of treatment and discuss progress on daily workbooks.    Participation Level:  Did Not Attend   Katina Dung 05/30/2023, 9:36 PM

## 2023-05-30 NOTE — [facility] Counselor (Signed)
CSW attempted to contact the patients father, Mark [name], [phone]. Voicemail box not set up and unable to leave a message.  Call was to identify any funds available to the patient.   Penni Homans, MSW, LCSW 05/30/2023 3:56 PM

## 2023-05-30 NOTE — [facility] Counselor (Signed)
CSW received return call from [facility].    Doristine Mango reports that he can come interview pt tomorrow around 12:30 pm, but he will let CSW know exactly what time.    Doristine Mango asks for [facility] and medications to be sent to him at [email or web address].    CSW will send email as requested.   Reynaldo Minium, MSW, [place] 05/30/2023 3:55 PM

## 2023-05-30 NOTE — Progress Notes (Signed)
[facility] MD Progress Note  05/28/2023 1300 Mark [name]  MRN:  [id number]  Subjective:  Mark [name] is a 63 year old African American male with a history of schizophrenia, HTN, and COPD who is here due to aggressive behavior toward his father. He has been living with father in [place]. Per father, there is an order of protection in place.  Chart reviewed, case discussed in multidisciplinary meeting, and patient seen on rounds. No acute changes or behavioral disturbances overnight. Today patient reports" fine" mood.  He had questions regarding his discharge.  Patient was provided with support and reassurance. Patient was informed that the social worker had called patient's father over the weekend to determine if patient receives SSI, but he has not answered the calls.Social work assisting with disposition as patient is unable to return to father's home and is not appropriate for shelter.  Today patient denies thoughts of harming himself or others.  He denies auditory visual hallucinations.  Reports his sleep and appetite has improved.  Patient was encouraged to attend group and work on coping strategies.   Principal Problem: Paranoid schizophrenia (HCC) Diagnosis: Principal Problem:   Paranoid schizophrenia (HCC)  Total Time spent with patient: 20 minutes  Past Psychiatric History: see h&p  Past Medical History:  Past Medical History:  Diagnosis Date   COPD (chronic obstructive pulmonary disease) (HCC)    Hypertension    Schizophrenia (HCC)     Past Surgical History:  Procedure Laterality Date   LOWER EXTREMITY ANGIOGRAPHY N/A 09/18/2017   Procedure: LOWER EXTREMITY ANGIOGRAPHY- Recheck lysis;  Surgeon: Maeola Harman, MD;  Location: [facility] INVASIVE CV LAB;  Service: Cardiovascular;  Laterality: N/A;   PERIPHERAL VASCULAR INTERVENTION Left 09/18/2017   Procedure: PERIPHERAL VASCULAR INTERVENTION;  Surgeon: Maeola Harman, MD;  Location: [facility] INVASIVE CV LAB;  Service:  Cardiovascular;  Laterality: Left;   THROMBECTOMY FEMORAL ARTERY Left 09/17/2017   Procedure: POSSIBLE THROMBECTOMY;  Surgeon: Maeola Harman, MD;  Location: [facility] OR;  Service: Vascular;  Laterality: Left;   VENOGRAM Left 09/17/2017   Procedure: ULTRASOUND POPLITEAL ACCESS; CENTRAL VENOGRAM, IVVS, LYSIS CATHETER PLACEMENT;  Surgeon: Maeola Harman, MD;  Location: [facility] OR;  Service: Vascular;  Laterality: Left;   Family History: History reviewed. No pertinent family history. Family Psychiatric  History: see h&p Social History:  Social History   Substance and Sexual Activity  Alcohol Use No     Social History   Substance and Sexual Activity  Drug Use Never    Social History   Socioeconomic History   Marital status: Single    Spouse name: Not on file   Number of children: Not on file   Years of education: Not on file   Highest education level: Not on file  Occupational History   Not on file  Tobacco Use   Smoking status: Every Day    Current packs/day: 0.50    Types: Cigarettes   Smokeless tobacco: Never  Vaping Use   Vaping status: Never Used  Substance and Sexual Activity   Alcohol use: No   Drug use: Never   Sexual activity: Not Currently  Other Topics Concern   Not on file  Social History Narrative   Not on file   Social Drivers of Health   Financial Resource Strain: Not on file  Food Insecurity: No Food Insecurity (04/27/2023)   Hunger Vital Sign    Worried About Running Out of Food in the Last Year: Never true    Ran Out of Food in  the Last Year: Never true  Transportation Needs: No Transportation Needs (04/27/2023)   PRAPARE - Administrator, Civil Service (Medical): No    Lack of Transportation (Non-Medical): No  Physical Activity: Not on file  Stress: Not on file  Social Connections: Not on file   Additional Social History:   Patient is homeless                      Sleep: Good  Appetite:  Good  Current  Medications: Current Facility-Administered Medications  Medication Dose Route Frequency Provider Last Rate Last Admin   acetaminophen (TYLENOL) tablet 650 mg  650 mg Oral Q6H PRN Motley-Mangrum, [name], PMHNP       alum & mag hydroxide-simeth (MAALOX/MYLANTA) 200-200-20 MG/5ML suspension 30 mL  30 mL Oral Q4H PRN Motley-Mangrum, [name], PMHNP   30 mL at 05/26/23 1908   amantadine (SYMMETREL) capsule 100 mg  100 mg Oral BID Lewanda Rife, MD   100 mg at 05/30/23 0927   amLODipine (NORVASC) tablet 5 mg  5 mg Oral Daily Lewanda Rife, MD   5 mg at 05/30/23 4098   aspirin EC tablet 81 mg  81 mg Oral Daily Lewanda Rife, MD   81 mg at 05/30/23 1191   haloperidol (HALDOL) tablet 5 mg  5 mg Oral TID PRN Motley-Mangrum, [name], PMHNP   5 mg at 05/09/23 4782   And   diphenhydrAMINE (BENADRYL) capsule 50 mg  50 mg Oral TID PRN Motley-Mangrum, [name], PMHNP   50 mg at 05/09/23 0834   haloperidol lactate (HALDOL) injection 10 mg  10 mg Intramuscular TID PRN Motley-Mangrum, [name], PMHNP   10 mg at 05/19/23 1242   And   diphenhydrAMINE (BENADRYL) injection 50 mg  50 mg Intramuscular TID PRN Motley-Mangrum, [name], PMHNP       And   LORazepam (ATIVAN) injection 2 mg  2 mg Intramuscular TID PRN Motley-Mangrum, [name], PMHNP       haloperidol lactate (HALDOL) injection 5 mg  5 mg Intramuscular TID PRN Motley-Mangrum, [name], PMHNP       And   diphenhydrAMINE (BENADRYL) injection 50 mg  50 mg Intramuscular TID PRN Motley-Mangrum, [name], PMHNP   50 mg at 05/19/23 1240   And   LORazepam (ATIVAN) injection 2 mg  2 mg Intramuscular TID PRN Motley-Mangrum, [name], PMHNP   2 mg at 05/19/23 1240   divalproex (DEPAKOTE ER) 24 hr tablet 500 mg  500 mg Oral BID Motley-Mangrum, [name], PMHNP   500 mg at 05/30/23 0927   gabapentin (NEURONTIN) capsule 400 mg  400 mg Oral BID Motley-Mangrum, [name], PMHNP   400 mg at 05/30/23 9562   hydrOXYzine (ATARAX) tablet 25 mg  25 mg Oral TID PRN  Motley-Mangrum, [name], PMHNP   25 mg at 05/22/23 2059   LORazepam (ATIVAN) tablet 1 mg  1 mg Oral Q8H PRN Motley-Mangrum, [name], PMHNP   1 mg at 05/09/23 0833   magnesium hydroxide (MILK OF MAGNESIA) suspension 30 mL  30 mL Oral Daily PRN Motley-Mangrum, [name], PMHNP       OLANZapine (ZYPREXA) tablet 15 mg  15 mg Oral QHS Lewanda Rife, MD   15 mg at 05/29/23 2115   traZODone (DESYREL) tablet 100 mg  100 mg Oral QHS PRN Motley-Mangrum, [name], PMHNP   100 mg at 05/29/23 2116   valbenazine ([name]) capsule 40 mg  40 mg Oral Daily  [name], DO   40 mg at 05/30/23 7829    Lab Results:  Results for orders placed or performed during the hospital encounter of 04/27/23 (from the past 48 hours)  Hemoglobin A1c     Status: Abnormal   Collection Time: 05/28/23  6:49 PM  Result Value Ref Range   Hgb A1c MFr Bld 5.8 (H) 4.8 - 5.6 %    Comment: (NOTE) Pre diabetes:          5.7%-6.4%  Diabetes:              >6.4%  Glycemic control for   <7.0% adults with diabetes    Mean Plasma Glucose 119.76 mg/dL    Comment: Performed at [facility], [address] [place], [place] [address]    Blood Alcohol level:  Lab Results  Component Value Date   [facility] <10 04/25/2023   ETH <10 12/31/2022    Metabolic Disorder Labs: Lab Results  Component Value Date   HGBA1C 5.8 (H) 05/28/2023   MPG 119.76 05/28/2023   MPG 111.15 12/31/2022   No results found for: "PROLACTIN" Lab Results  Component Value Date   CHOL 133 12/31/2022   TRIG 39 12/31/2022   HDL 54 12/31/2022   CHOLHDL 2.5 12/31/2022   VLDL 8 12/31/2022   LDLCALC 71 12/31/2022   LDLCALC 69 12/01/2022    COWS:     Musculoskeletal: Strength & Muscle Tone: within normal limits Gait & Station: normal Patient leans: N/A  Psychiatric Specialty Exam:  Presentation  General Appearance:  Appropriate for Environment; Casual; Fairly Groomed  Eye Contact: Good  Speech: Clear and Coherent; Normal  Rate  Speech Volume: Normal  Handedness: Right   Mood and Affect  Mood: "Fine"  Affect: Appropriate; Congruent   Thought Process  Thought Processes: Coherent  Descriptions of Associations:Intact  Orientation:Full (Time, Place and Person)  Thought Content:Logical  History of Schizophrenia/Schizoaffective disorder:Yes  Duration of Psychotic Symptoms:Greater than six months  Hallucinations:Denies  Ideas of Reference:None  Suicidal Thoughts:Denies  Homicidal Thoughts:Denies   Sensorium  Memory: Immediate Good; Recent Good  Judgment: Fair  Insight: Fair   Art therapist  Concentration: Good  Attention Span: Good  Recall: Good  Fund of Knowledge: Good  Language: Good   Psychomotor Activity  Psychomotor Activity: Psychomotor Activity: Normal    Assets  Assets: Communication Skills; Desire for Improvement; Physical Health; Resilience; Social Support   Sleep  Sleep: Fair     Physical Exam: Physical Exam Vitals and nursing note reviewed.  HENT:     Head: Normocephalic and atraumatic.     Nose: Nose normal.  Eyes:     Extraocular Movements: Extraocular movements intact.  Pulmonary:     Effort: Pulmonary effort is normal.  Musculoskeletal:        General: Normal range of motion.     Cervical back: Normal range of motion.  Skin:    General: Skin is warm and dry.  Neurological:     Mental Status: He is alert and oriented to person, place, and time. Mental status is at baseline.  Psychiatric:        Mood and Affect: Mood normal.        Behavior: Behavior normal.        Thought Content: Thought content normal.        Judgment: Judgment normal.    Review of Systems  All other systems reviewed and are negative.  Blood pressure 125/67, pulse 64, temperature (!) 97.2 F (  36.2 C), resp. rate 16, height 5\' 11"  (1.803 m), weight 71.2 kg, SpO2 100%. Body mass index is 21.9 kg/m.   Treatment Plan Summary: Daily contact  with patient to assess and evaluate symptoms and progress in treatment and Medication management  Continue olanzapine 15 mg at bedtime for schizophrenia Continue Depakote 500 mg po BID for mood Continue [name] 40 mg p.o. daily for TD Continue Norvasc 5 mg by mouth daily for HTN   Valproic acid level : 60, ALT/AST 10/16, QTC 438 05/28/23: HgbA1c 5.8  Lewanda Rife, MD

## 2023-05-30 NOTE — Group Note (Signed)
Date:  05/30/2023 Time:  7:02 PM  Group Topic/Focus:  Rediscovering Joy:   The focus of this group is to explore various ways to relieve stress in a positive manner. Music Therapy    Participation Level:  Active  Participation Quality:  Appropriate  Affect:  Appropriate  Cognitive:  Appropriate  Insight: Appropriate  Engagement in Group:  Developing/Improving  Modes of Intervention:  Activity and Clarification  Additional Comments:    Joyell Emami 05/30/2023, 7:02 PM

## 2023-05-30 NOTE — [place] FL2 (Signed)
[place] MEDICAID FL2 LEVEL OF CARE FORM     IDENTIFICATION  Patient Name: Mark [name] Birthdate: [birth year] Sex: male Admission Date (Current Location): 04/27/2023  [facility] and [place] Number:  Chiropodist and Address:         Provider Number: [id number]  Attending Physician Name and Address:  Lewanda Rife, MD  Relative Name and Phone Number:       Current Level of Care: Hospital Recommended Level of Care: Assisted Living Facility, [facility], Other (Comment) (Group Home) Prior Approval Number:    Date Approved/Denied:   PASRR Number:    Discharge Plan: Other (Comment) (Group Home, Family Care Home, Assisted Living Facility)    Current Diagnoses: Patient Active Problem List   Diagnosis Date Noted   Paranoid schizophrenia (HCC) 04/27/2023   Paranoid schizophrenia, chronic condition (HCC) 04/25/2023   Aggression 04/25/2023   Tardive dyskinesia 01/02/2022   Psychosis (HCC) 12/27/2021   Paranoia (psychosis) (HCC) 12/04/2021   Schizophrenia (HCC) 11/26/2021   Mood altered 11/26/2021   Acute encephalopathy 11/25/2021   AMS (altered mental status) 11/24/2021   Hypercalcemia 11/24/2021   Hypernatremia 11/24/2021   Elevated troponin 11/24/2021   AKI (acute kidney injury) (HCC) 11/24/2021   Metabolic acidosis 11/24/2021   HTN (hypertension) 11/24/2021   Leukocytosis 11/24/2021   Drug overdose 06/28/2021   AMS (altered mental status) 06/28/2021   AKI (acute kidney injury) (HCC) 06/28/2021   Hyperglycemia 06/28/2021   History of adenomatous polyp of colon 11/04/2018   Schizophrenia (HCC) 11/04/2018   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   DVT (deep venous thrombosis) (HCC) 09/15/2017   COPD (chronic obstructive pulmonary disease) (HCC) 09/15/2017   Hypertension 09/15/2017   Leukocytosis 09/15/2017   Bipolar disorder (HCC) 09/15/2017   Drug therapy 06/21/2017   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    Orientation RESPIRATION BLADDER Height & Weight     Self, Place  Normal (COPD) Continent Weight: 157 lb (71.2 kg) Height:  5\' 11"  (180.3 cm)  BEHAVIORAL SYMPTOMS/MOOD NEUROLOGICAL BOWEL NUTRITION STATUS     (NA) Continent Diet (Regular)  AMBULATORY STATUS COMMUNICATION OF NEEDS Skin   Independent Verbally Normal                       Personal Care Assistance Level of Assistance   (NA)           Functional Limitations Info   (NA)          SPECIAL CARE FACTORS FREQUENCY  Blood pressure                    Contractures Contractures Info: Not present    Additional Factors Info  Code Status, Psychotropic Code Status Info: FULL   Psychotropic Info: divalproex (DEPAKOTE ER) 24 hr tablet 500 mg  2 times daily; OLANZapine (ZYPREXA) tablet 15 mg  Daily at bedtime, valbenazine ([name]) capsule 40 mg  Daily         Current Medications (05/30/2023):  This is the current hospital active medication list Current Facility-Administered Medications  Medication Dose Route Frequency Provider Last Rate Last Admin   acetaminophen (TYLENOL) tablet 650 mg  650 mg Oral Q6H PRN Motley-Mangrum, [name], PMHNP       alum & mag hydroxide-simeth (MAALOX/MYLANTA) 200-200-20 MG/5ML suspension 30 mL  30 mL Oral Q4H PRN Motley-Mangrum, [name], PMHNP   30 mL at 05/26/23 1908   amantadine (SYMMETREL) capsule 100 mg  100 mg Oral BID Lewanda Rife, MD   100 mg at 05/30/23 0927   amLODipine (NORVASC) tablet 5 mg  5 mg Oral Daily Lewanda Rife, MD   5 mg at 05/30/23 5409   aspirin EC tablet 81 mg  81 mg Oral Daily Lewanda Rife, MD   81 mg at 05/30/23 8119   haloperidol (HALDOL) tablet 5 mg  5 mg Oral TID PRN Motley-Mangrum, [name], PMHNP   5 mg at 05/09/23 1478   And   diphenhydrAMINE (BENADRYL) capsule 50 mg  50 mg Oral TID PRN Motley-Mangrum, [name], PMHNP   50 mg at 05/09/23 0834   haloperidol lactate (HALDOL) injection 10 mg  10 mg Intramuscular  TID PRN Motley-Mangrum, Geralynn Ochs A, PMHNP   10 mg at 05/19/23 1242   And   diphenhydrAMINE (BENADRYL) injection 50 mg  50 mg Intramuscular TID PRN Motley-Mangrum, [name], PMHNP       And   LORazepam (ATIVAN) injection 2 mg  2 mg Intramuscular TID PRN Motley-Mangrum, [name], PMHNP       haloperidol lactate (HALDOL) injection 5 mg  5 mg Intramuscular TID PRN Motley-Mangrum, [name], PMHNP       And   diphenhydrAMINE (BENADRYL) injection 50 mg  50 mg Intramuscular TID PRN Motley-Mangrum, [name], PMHNP   50 mg at 05/19/23 1240   And   LORazepam (ATIVAN) injection 2 mg  2 mg Intramuscular TID PRN Motley-Mangrum, [name], PMHNP   2 mg at 05/19/23 1240   divalproex (DEPAKOTE ER) 24 hr tablet 500 mg  500 mg Oral BID Motley-Mangrum, [name], PMHNP   500 mg at 05/30/23 0927   gabapentin (NEURONTIN) capsule 400 mg  400 mg Oral BID Motley-Mangrum, [name], PMHNP   400 mg at 05/30/23 2956   hydrOXYzine (ATARAX) tablet 25 mg  25 mg Oral TID PRN Motley-Mangrum, [name], PMHNP   25 mg at 05/22/23 2059   LORazepam (ATIVAN) tablet 1 mg  1 mg Oral Q8H PRN Motley-Mangrum, [name], PMHNP   1 mg at 05/09/23 0833   magnesium hydroxide (MILK OF MAGNESIA) suspension 30 mL  30 mL Oral Daily PRN Motley-Mangrum, [name], PMHNP       OLANZapine (ZYPREXA) tablet 15 mg  15 mg Oral QHS Lewanda Rife, MD   15 mg at 05/29/23 2115   traZODone (DESYREL) tablet 100 mg  100 mg Oral QHS PRN Motley-Mangrum, [name], PMHNP   100 mg at 05/29/23 2116   valbenazine ([name]) capsule 40 mg  40 mg Oral Daily [name], DO   40 mg at 05/30/23 2130     Discharge Medications: Please see discharge summary for a list of discharge medications.  Relevant Imaging Results:  Relevant Lab Results:   Additional Information    Harden Mo, LCSW

## 2023-05-30 NOTE — Group Note (Signed)
LCSW Group Therapy Note  Group Date: 05/30/2023 Start Time: 1315 End Time: 1415   Type of Therapy and Topic:  Group Therapy: Cycles of Anxiety   Participation Level:  Active   Description of Group:   In this group, patients learned how to recognize the physical, cognitive, emotional, and behavioral responses they have to anxiety-provoking situations.  They identified a recent time they became anxious and how they reacted.  Reviewed the cycle of anxiety.  They analyzed how their reaction was possibly beneficial and how it was possibly unhelpful.  The group discussed a variety of healthier coping skills that could help with such a situation in the future.  Focus was placed on how helpful it is to recognize the underlying emotions to our anxiety, because working on those can lead to a more permanent solution as well as our ability to focus on the important rather than the urgent.  Therapeutic Goals: Patients will remember their last incident of anxiety and how they felt emotionally and physically, what their thoughts were at the time, and how they behaved. Patients will identify how their behavior at that time worked for them, as well as how it worked against them. Patients will explore possible new behaviors to use in future anxious situations. Patients will learn that anxiety itself is normal and cannot be eliminated, and that healthier reactions can assist with resolving conflict rather than worsening situations.  Summary of Patient Progress:   Patient was present and active during the group. He shared his symptoms of anxiety.  He also shared how he handles anxiety.  He was able to engage in discussion on coping skills.  He reports that he uses music and a coping mechanism.  He reports that he no longer watches TC, however, he likes shows like "Sister [employer], [employer] [address], Daphine Deutscher and [employer]".  He demonstrated poor insight into the subject matter, was respectful of peers, and participated  throughout the entire session.  Therapeutic Modalities:   Cognitive Behavioral Therapy    Harden Mo, LCSW 05/30/2023  3:01 PM

## 2023-05-30 NOTE — Group Note (Signed)
Recreation Therapy Group Note   Group Topic:Problem Solving  Group Date: 05/30/2023 Start Time: 1000 End Time: 1050 Facilitators: Rosina Lowenstein, LRT, CTRS Location:  Craft Room  Group Description: Life Boat. Patients were given the scenario that they are on a boat that is about to become shipwrecked, leaving them stranded on an [place]. They are asked to make a list of 15 different items that they want to take with them when they are stranded on the [place]. Patients are asked to rank their items from most important to least important, #1 being the most important and #15 being the least. Patients will work individually for the first round to come up with 15 items and then pair up with a peer(s) to condense their list and come up with one list of 15 items between the two of them. Patients or LRT will read aloud the 15 different items to the group after each round. LRT facilitated post-activity processing to discuss how this activity can be used in daily life post discharge.   Goal Area(s) Addressed:  Patient will identify priorities, wants and needs. Patient will communicate with LRT and peers. Patient will work collectively as a Administrator, Civil Service. Patient will work on Product manager.    Affect/Mood: N/A   Participation Level: Did not attend    Clinical Observations/Individualized Feedback: Patient did not attend group.   Plan: Continue to engage patient in RT group sessions 2-3x/week.   Rosina Lowenstein, LRT, CTRS 05/30/2023 12:33 PM

## 2023-05-30 NOTE — Progress Notes (Signed)
   05/30/23 1478  Psych Admission Type (Psych Patients Only)  Admission Status Involuntary  Psychosocial Assessment  Patient Complaints None  Eye Contact Brief  Facial Expression Flat  Affect Appropriate to circumstance  Speech Logical/coherent  Interaction Assertive  Motor Activity Tremors  Appearance/Hygiene In scrubs  Behavior Characteristics Cooperative  Mood Pleasant  Thought Process  Coherency WDL  Content WDL  Delusions None reported or observed  Perception WDL  Hallucination None reported or observed  Judgment Poor  Confusion None  Danger to Self  Current suicidal ideation? Denies  Agreement Not to Harm Self Yes  Description of Agreement Verbal  Danger to Others  Danger to Others None reported or observed

## 2023-05-30 NOTE — Group Note (Signed)
Date:  05/30/2023 Time:  12:45 PM  Group Topic/Focus:  Goals Group:   The focus of this group is to help patients establish daily goals to achieve during treatment and discuss how the patient can incorporate goal setting into their daily lives to aide in recovery. Managing Feelings:   The focus of this group is to identify what feelings patients have difficulty handling and develop a plan to handle them in a healthier way upon discharge.    Participation Level:  Did Not Attend    Mark [name] 05/30/2023, 12:45 PM

## 2023-05-30 NOTE — Plan of Care (Signed)
  Problem: Education: Goal: Emotional status will improve Outcome: Progressing   Problem: Education: Goal: Mental status will improve Outcome: Progressing   

## 2023-05-30 NOTE — Plan of Care (Signed)
°  Problem: Education: °Goal: Emotional status will improve °Outcome: Progressing °Goal: Mental status will improve °Outcome: Progressing °Goal: Verbalization of understanding the information provided will improve °Outcome: Progressing °  °

## 2023-05-31 DIAGNOSIS — F200 Paranoid schizophrenia: Secondary | ICD-10-CM | POA: Diagnosis not present

## 2023-05-31 NOTE — Progress Notes (Signed)
[facility] MD Progress Note  05/28/2023 1300 Mark [name]  MRN:  [id number]  Subjective:  Mark [name] is a 63 year old African American male with a history of schizophrenia, HTN, and COPD who is here due to aggressive behavior toward his father. He has been living with father in [place]. Per father, there is an order of protection in place.  Chart reviewed, and patient seen during rounds. No acute changes or behavioral disturbances overnight. Today patient reports" fine" mood.  He continues to ask questions regarding his discharge.  Patient was provided with support and reassurance. Social work assisting with disposition as patient is unable to return to father's home and is not appropriate for shelter.  Today patient denies thoughts of harming himself or others.  He denies auditory visual hallucinations.  Reports his sleep and appetite has improved.  Patient was encouraged to attend group and work on coping strategies.   Principal Problem: Paranoid schizophrenia (HCC) Diagnosis: Principal Problem:   Paranoid schizophrenia (HCC)  Total Time spent with patient: 20 minutes  Past Psychiatric History: see h&p  Past Medical History:  Past Medical History:  Diagnosis Date   COPD (chronic obstructive pulmonary disease) (HCC)    Hypertension    Schizophrenia (HCC)     Past Surgical History:  Procedure Laterality Date   LOWER EXTREMITY ANGIOGRAPHY N/A 09/18/2017   Procedure: LOWER EXTREMITY ANGIOGRAPHY- Recheck lysis;  Surgeon: Maeola Harman, MD;  Location: [facility] INVASIVE CV LAB;  Service: Cardiovascular;  Laterality: N/A;   PERIPHERAL VASCULAR INTERVENTION Left 09/18/2017   Procedure: PERIPHERAL VASCULAR INTERVENTION;  Surgeon: Maeola Harman, MD;  Location: [facility] INVASIVE CV LAB;  Service: Cardiovascular;  Laterality: Left;   THROMBECTOMY FEMORAL ARTERY Left 09/17/2017   Procedure: POSSIBLE THROMBECTOMY;  Surgeon: Maeola Harman, MD;  Location: [facility] OR;  Service:  Vascular;  Laterality: Left;   VENOGRAM Left 09/17/2017   Procedure: ULTRASOUND POPLITEAL ACCESS; CENTRAL VENOGRAM, IVVS, LYSIS CATHETER PLACEMENT;  Surgeon: Maeola Harman, MD;  Location: [facility] OR;  Service: Vascular;  Laterality: Left;   Family History: History reviewed. No pertinent family history. Family Psychiatric  History: see h&p Social History:  Social History   Substance and Sexual Activity  Alcohol Use No     Social History   Substance and Sexual Activity  Drug Use Never    Social History   Socioeconomic History   Marital status: Single    Spouse name: Not on file   Number of children: Not on file   Years of education: Not on file   Highest education level: Not on file  Occupational History   Not on file  Tobacco Use   Smoking status: Every Day    Current packs/day: 0.50    Types: Cigarettes   Smokeless tobacco: Never  Vaping Use   Vaping status: Never Used  Substance and Sexual Activity   Alcohol use: No   Drug use: Never   Sexual activity: Not Currently  Other Topics Concern   Not on file  Social History Narrative   Not on file   Social Drivers of Health   Financial Resource Strain: Not on file  Food Insecurity: No Food Insecurity (04/27/2023)   Hunger Vital Sign    Worried About Running Out of Food in the Last Year: Never true    Ran Out of Food in the Last Year: Never true  Transportation Needs: No Transportation Needs (04/27/2023)   PRAPARE - Transportation    Lack of Transportation (Medical): No    Lack  of Transportation (Non-Medical): No  Physical Activity: Not on file  Stress: Not on file  Social Connections: Not on file   Additional Social History:   Patient is homeless                      Sleep: Good  Appetite:  Good  Current Medications: Current Facility-Administered Medications  Medication Dose Route Frequency Provider Last Rate Last Admin   acetaminophen (TYLENOL) tablet 650 mg  650 mg Oral Q6H PRN  Motley-Mangrum, [name], PMHNP       alum & mag hydroxide-simeth (MAALOX/MYLANTA) 200-200-20 MG/5ML suspension 30 mL  30 mL Oral Q4H PRN Motley-Mangrum, [name], PMHNP   30 mL at 05/26/23 1908   amantadine (SYMMETREL) capsule 100 mg  100 mg Oral BID Lewanda Rife, MD   100 mg at 05/31/23 1021   amLODipine (NORVASC) tablet 5 mg  5 mg Oral Daily Lewanda Rife, MD   5 mg at 05/31/23 1021   aspirin EC tablet 81 mg  81 mg Oral Daily Lewanda Rife, MD   81 mg at 05/31/23 1021   haloperidol (HALDOL) tablet 5 mg  5 mg Oral TID PRN Motley-Mangrum, [name], PMHNP   5 mg at 05/09/23 1610   And   diphenhydrAMINE (BENADRYL) capsule 50 mg  50 mg Oral TID PRN Motley-Mangrum, [name], PMHNP   50 mg at 05/09/23 0834   haloperidol lactate (HALDOL) injection 10 mg  10 mg Intramuscular TID PRN Motley-Mangrum, [name], PMHNP   10 mg at 05/19/23 1242   And   diphenhydrAMINE (BENADRYL) injection 50 mg  50 mg Intramuscular TID PRN Motley-Mangrum, [name], PMHNP       And   LORazepam (ATIVAN) injection 2 mg  2 mg Intramuscular TID PRN Motley-Mangrum, [name], PMHNP       haloperidol lactate (HALDOL) injection 5 mg  5 mg Intramuscular TID PRN Motley-Mangrum, [name], PMHNP       And   diphenhydrAMINE (BENADRYL) injection 50 mg  50 mg Intramuscular TID PRN Motley-Mangrum, [name], PMHNP   50 mg at 05/19/23 1240   And   LORazepam (ATIVAN) injection 2 mg  2 mg Intramuscular TID PRN Motley-Mangrum, [name], PMHNP   2 mg at 05/19/23 1240   divalproex (DEPAKOTE ER) 24 hr tablet 500 mg  500 mg Oral BID Motley-Mangrum, [name], PMHNP   500 mg at 05/31/23 1728   gabapentin (NEURONTIN) capsule 400 mg  400 mg Oral BID Motley-Mangrum, [name], PMHNP   400 mg at 05/31/23 1021   hydrOXYzine (ATARAX) tablet 25 mg  25 mg Oral TID PRN Motley-Mangrum, [name], PMHNP   25 mg at 05/22/23 2059   LORazepam (ATIVAN) tablet 1 mg  1 mg Oral Q8H PRN Motley-Mangrum, [name], PMHNP   1 mg at 05/09/23 0833   magnesium  hydroxide (MILK OF MAGNESIA) suspension 30 mL  30 mL Oral Daily PRN Motley-Mangrum, [name], PMHNP       OLANZapine (ZYPREXA) tablet 15 mg  15 mg Oral QHS Lewanda Rife, MD   15 mg at 05/30/23 2146   traZODone (DESYREL) tablet 100 mg  100 mg Oral QHS PRN Motley-Mangrum, [name], PMHNP   100 mg at 05/30/23 2147   valbenazine ([name]) capsule 40 mg  40 mg Oral Daily [name], DO   40 mg at 05/31/23 1021    Lab Results:  No results found for this or any previous visit (from the past  48 hours).   Blood Alcohol level:  Lab Results  Component Value Date   ETH <10 04/25/2023   ETH <10 12/31/2022    Metabolic Disorder Labs: Lab Results  Component Value Date   HGBA1C 5.8 (H) 05/28/2023   MPG 119.76 05/28/2023   MPG 111.15 12/31/2022   No results found for: "PROLACTIN" Lab Results  Component Value Date   CHOL 133 12/31/2022   TRIG 39 12/31/2022   HDL 54 12/31/2022   CHOLHDL 2.5 12/31/2022   VLDL 8 12/31/2022   LDLCALC 71 12/31/2022   LDLCALC 69 12/01/2022    COWS:     Musculoskeletal: Strength & Muscle Tone: within normal limits Gait & Station: normal Patient leans: N/A  Psychiatric Specialty Exam:  Presentation  General Appearance:  Appropriate for Environment; Casual; Fairly Groomed  Eye Contact: Good  Speech: Clear and Coherent; Normal Rate  Speech Volume: Normal  Handedness: Right   Mood and Affect  Mood: "Fine"  Affect: Appropriate; Congruent   Thought Process  Thought Processes: Coherent  Descriptions of Associations:Intact  Orientation:Full (Time, Place and Person)  Thought Content:Logical  History of Schizophrenia/Schizoaffective disorder:Yes  Duration of Psychotic Symptoms:Greater than six months  Hallucinations:Denies  Ideas of Reference:None  Suicidal Thoughts:Denies  Homicidal Thoughts:Denies   Sensorium  Memory: Immediate Good; Recent Good  Judgment: Fair  Insight: Fair   Restaurant manager, fast food  Concentration: Good  Attention Span: Good  Recall: Good  Fund of Knowledge: Good  Language: Good   Psychomotor Activity  Psychomotor Activity: No data recorded    Assets  Assets: Communication Skills; Desire for Improvement; Physical Health; Resilience; Social Support   Sleep  Sleep: Fair     Physical Exam: Physical Exam Vitals and nursing note reviewed.  HENT:     Head: Normocephalic and atraumatic.     Nose: Nose normal.  Eyes:     Extraocular Movements: Extraocular movements intact.  Pulmonary:     Effort: Pulmonary effort is normal.  Musculoskeletal:        General: Normal range of motion.     Cervical back: Normal range of motion.  Skin:    General: Skin is warm and dry.  Neurological:     Mental Status: He is alert and oriented to person, place, and time. Mental status is at baseline.  Psychiatric:        Mood and Affect: Mood normal.        Behavior: Behavior normal.        Thought Content: Thought content normal.        Judgment: Judgment normal.    Review of Systems  All other systems reviewed and are negative.  Blood pressure (!) 152/80, pulse 77, temperature 98.6 F (37 C), resp. rate 17, height 5\' 11"  (1.803 m), weight 71.2 kg, SpO2 100%. Body mass index is 21.9 kg/m.   Treatment Plan Summary: Daily contact with patient to assess and evaluate symptoms and progress in treatment and Medication management  Continue olanzapine 15 mg at bedtime for schizophrenia Continue Depakote 500 mg po BID for mood Continue [name] 40 mg p.o. daily for TD Continue Norvasc 5 mg by mouth daily for HTN   Valproic acid level : 60, ALT/AST 10/16, QTC 438 05/28/23: HgbA1c 5.8  Lewanda Rife, MD

## 2023-05-31 NOTE — Progress Notes (Signed)
Pt is calm and cooperative, irritable at times.  Pt is compliant with medications Denies SI HI AVH  Continued monitoring for safety.    05/31/23 1000  Psych Admission Type (Psych Patients Only)  Admission Status Involuntary  Psychosocial Assessment  Patient Complaints None  Eye Contact Brief  Facial Expression Flat  Affect Appropriate to circumstance  Speech Logical/coherent  Interaction Assertive  Motor Activity Tremors  Appearance/Hygiene In scrubs  Behavior Characteristics Cooperative  Mood Pleasant  Thought Process  Coherency WDL  Content WDL  Delusions None reported or observed  Perception WDL  Hallucination None reported or observed  Judgment Poor  Confusion None  Danger to Self  Current suicidal ideation? Denies  Danger to Others  Danger to Others None reported or observed

## 2023-05-31 NOTE — Group Note (Signed)
Date:  05/31/2023 Time:  10:30 AM  Group Topic/Focus:  Conflict Resolution:   The focus of this group is to discuss the conflict resolution process and how it may be used upon discharge. Goals Group:   The focus of this group is to help patients establish daily goals to achieve during treatment and discuss how the patient can incorporate goal setting into their daily lives to aide in recovery.    Participation Level:  Active  Participation Quality:  Appropriate and Attentive  Affect:  Appropriate and Excited  Cognitive:  Alert, Appropriate, and Oriented  Insight: Appropriate  Engagement in Group:  Developing/Improving and Engaged  Modes of Intervention:  Activity, Discussion, and Education  Additional Comments:    Mark [name] 05/31/2023, 10:30 AM

## 2023-05-31 NOTE — Group Note (Signed)
Date:  05/31/2023 Time:  9:15 PM  Group Topic/Focus:  Self Care:   The focus of this group is to help patients understand the importance of self-care in order to improve or restore emotional, physical, spiritual, interpersonal, and financial health.    Participation Level:  Did Not Attend  Participation Quality:   none  Affect:   none  Cognitive:   none  Insight: None  Engagement in Group:   none  Modes of Intervention:   none  Additional Comments:  none   Belva Crome 05/31/2023, 9:15 PM

## 2023-05-31 NOTE — Group Note (Signed)
[facility] LCSW Group Therapy Note   Group Date: 05/31/2023 Start Time: 1300 End Time: 1400   Type of Therapy/Topic:  Group Therapy:  Emotion Regulation  Participation Level:  Did Not Attend    Description of Group:    The purpose of this group is to assist patients in learning to regulate negative emotions and experience positive emotions. Patients will be guided to discuss ways in which they have been vulnerable to their negative emotions. These vulnerabilities will be juxtaposed with experiences of positive emotions or situations, and patients challenged to use positive emotions to combat negative ones. Special emphasis will be placed on coping with negative emotions in conflict situations, and patients will process healthy conflict resolution skills.  Therapeutic Goals: Patient will identify two positive emotions or experiences to reflect on in order to balance out negative emotions:  Patient will label two or more emotions that they find the most difficult to experience:  Patient will be able to demonstrate positive conflict resolution skills through discussion or role plays:   Summary of Patient Progress: X   Therapeutic Modalities:   Cognitive Behavioral Therapy Feelings Identification Dialectical Behavioral Therapy   Glenis Smoker, LCSW

## 2023-05-31 NOTE — Progress Notes (Signed)
Nursing Shift Note:  1900-0700  Attended Evening Group: No Medication Compliant:  Yes Behavior: cooperative; irritable Sleep Quality: good Significant Changes: none   05/31/23 0400  Psych Admission Type (Psych Patients Only)  Admission Status Involuntary  Psychosocial Assessment  Patient Complaints None  Eye Contact Brief  Facial Expression Flat  Affect Appropriate to circumstance  Speech Logical/coherent  Interaction Assertive  Motor Activity Tremors  Appearance/Hygiene In scrubs  Behavior Characteristics Cooperative;Irritable  Mood Pleasant  Thought Process  Coherency WDL  Content WDL  Delusions None reported or observed  Perception WDL  Hallucination None reported or observed  Judgment Poor  Confusion None  Danger to Self  Current suicidal ideation? Denies  Danger to Others  Danger to Others None reported or observed

## 2023-05-31 NOTE — Plan of Care (Signed)
  Problem: Education: Goal: Emotional status will improve 05/31/2023 0945 by Elmon Kirschner, RN Outcome: Progressing 05/30/2023 2248 by Elmon Kirschner, RN Outcome: Progressing

## 2023-05-31 NOTE — Progress Notes (Signed)
Pt calm and pleasant during assessment denying SI/HI/AVH. Pt observed by this Clinical research associate interacting appropriately with staff and peers on the unit. Pt compliant with medication administration per MD orders. Pt given education, support, and encouragement to be active in his treatment plan. Pt being monitored Q 15 minutes for safety per unit protocol, remains safe on the unit

## 2023-05-31 NOTE — Group Note (Signed)
Date:  05/31/2023 Time:  5:01 PM  Group Topic/Focus:  Healthy Communication:   The focus of this group is to discuss communication, barriers to communication, as well as healthy ways to communicate with others. Movie Therapy    Participation Level:  Active  Participation Quality:  Appropriate  Affect:  Appropriate  Cognitive:  Appropriate  Insight: Appropriate  Engagement in Group:  Developing/Improving, Engaged, and Improving  Modes of Intervention:  Activity  Additional Comments:    Noora Locascio 05/31/2023, 5:01 PM

## 2023-06-01 DIAGNOSIS — F200 Paranoid schizophrenia: Secondary | ICD-10-CM | POA: Diagnosis not present

## 2023-06-01 NOTE — Group Note (Signed)
LCSW Group Therapy Note  Group Date: 06/01/2023 Start Time: 1300 End Time: 1400   Type of Therapy and Topic:  Group Therapy - Healthy vs Unhealthy Coping Skills  Participation Level:  Did Not Attend   Description of Group The focus of this group was to determine what unhealthy coping techniques typically are used by group members and what healthy coping techniques would be helpful in coping with various problems. Patients were guided in becoming aware of the differences between healthy and unhealthy coping techniques. Patients were asked to identify 2-3 healthy coping skills they would like to learn to use more effectively.  Therapeutic Goals Patients learned that coping is what human beings do all day long to deal with various situations in their lives Patients defined and discussed healthy vs unhealthy coping techniques Patients identified their preferred coping techniques and identified whether these were healthy or unhealthy Patients determined 2-3 healthy coping skills they would like to become more familiar with and use more often. Patients provided support and ideas to each other   Summary of Patient Progress:  Patient did not attend group.    Therapeutic Modalities Cognitive Behavioral Therapy Motivational Interviewing  Perrin Smack 06/01/2023  1:51 PM

## 2023-06-01 NOTE — Group Note (Signed)
Date:  06/01/2023 Time:  8:33 PM  Group Topic/Focus:  Wellness Toolbox:   The focus of this group is to discuss various aspects of wellness, balancing those aspects and exploring ways to increase the ability to experience wellness.  Patients will create a wellness toolbox for use upon discharge.    Participation Level:  Minimal  Participation Quality:  Appropriate  Affect:  Appropriate  Cognitive:  Appropriate  Insight: Appropriate and Good  Engagement in Group:  Engaged  Modes of Intervention:  Support  Additional Comments:     Belva Crome 06/01/2023, 8:33 PM

## 2023-06-01 NOTE — Progress Notes (Signed)
[facility] MD Progress Note  05/28/2023  Mark [name]  MRN:  [id number]  Subjective:  Mark [name] is a 63 year old African American male with a history of schizophrenia, HTN, and COPD who is here due to aggressive behavior toward his father. He has been living with father in [place]. Per father, there is an order of protection in place.  Chart reviewed, and patient seen during rounds. No acute changes or behavioral disturbances overnight. Today patient reports" fine" mood. Social work assisting with disposition as patient is unable to return to father's home and is not appropriate for shelter.  Today patient denies thoughts of harming himself or others.  He denies auditory visual hallucinations.  Reports his sleep and appetite has improved.  Patient was encouraged to attend group and work on coping strategies.   Principal Problem: Paranoid schizophrenia (HCC) Diagnosis: Principal Problem:   Paranoid schizophrenia (HCC)  Total Time spent with patient: 20 minutes  Past Psychiatric History: see h&p  Past Medical History:  Past Medical History:  Diagnosis Date   COPD (chronic obstructive pulmonary disease) (HCC)    Hypertension    Schizophrenia (HCC)     Past Surgical History:  Procedure Laterality Date   LOWER EXTREMITY ANGIOGRAPHY N/A 09/18/2017   Procedure: LOWER EXTREMITY ANGIOGRAPHY- Recheck lysis;  Surgeon: Maeola Harman, MD;  Location: [facility] INVASIVE CV LAB;  Service: Cardiovascular;  Laterality: N/A;   PERIPHERAL VASCULAR INTERVENTION Left 09/18/2017   Procedure: PERIPHERAL VASCULAR INTERVENTION;  Surgeon: Maeola Harman, MD;  Location: [facility] INVASIVE CV LAB;  Service: Cardiovascular;  Laterality: Left;   THROMBECTOMY FEMORAL ARTERY Left 09/17/2017   Procedure: POSSIBLE THROMBECTOMY;  Surgeon: Maeola Harman, MD;  Location: [facility] OR;  Service: Vascular;  Laterality: Left;   VENOGRAM Left 09/17/2017   Procedure: ULTRASOUND POPLITEAL ACCESS; CENTRAL  VENOGRAM, IVVS, LYSIS CATHETER PLACEMENT;  Surgeon: Maeola Harman, MD;  Location: [facility] OR;  Service: Vascular;  Laterality: Left;   Family History: History reviewed. No pertinent family history. Family Psychiatric  History: see h&p Social History:  Social History   Substance and Sexual Activity  Alcohol Use No     Social History   Substance and Sexual Activity  Drug Use Never    Social History   Socioeconomic History   Marital status: Single    Spouse name: Not on file   Number of children: Not on file   Years of education: Not on file   Highest education level: Not on file  Occupational History   Not on file  Tobacco Use   Smoking status: Every Day    Current packs/day: 0.50    Types: Cigarettes   Smokeless tobacco: Never  Vaping Use   Vaping status: Never Used  Substance and Sexual Activity   Alcohol use: No   Drug use: Never   Sexual activity: Not Currently  Other Topics Concern   Not on file  Social History Narrative   Not on file   Social Drivers of Health   Financial Resource Strain: Not on file  Food Insecurity: No Food Insecurity (04/27/2023)   Hunger Vital Sign    Worried About Running Out of Food in the Last Year: Never true    Ran Out of Food in the Last Year: Never true  Transportation Needs: No Transportation Needs (04/27/2023)   PRAPARE - Administrator, Civil Service (Medical): No    Lack of Transportation (Non-Medical): No  Physical Activity: Not on file  Stress: Not on file  Social  Connections: Not on file   Additional Social History:   Patient is homeless                      Sleep: Good  Appetite:  Good  Current Medications: Current Facility-Administered Medications  Medication Dose Route Frequency Provider Last Rate Last Admin   acetaminophen (TYLENOL) tablet 650 mg  650 mg Oral Q6H PRN Motley-Mangrum, [name], PMHNP       alum & mag hydroxide-simeth (MAALOX/MYLANTA) 200-200-20 MG/5ML suspension 30  mL  30 mL Oral Q4H PRN Motley-Mangrum, [name], PMHNP   30 mL at 06/01/23 1354   amantadine (SYMMETREL) capsule 100 mg  100 mg Oral BID Lewanda Rife, MD   100 mg at 06/01/23 2116   amLODipine (NORVASC) tablet 5 mg  5 mg Oral Daily Lewanda Rife, MD   5 mg at 06/01/23 1610   aspirin EC tablet 81 mg  81 mg Oral Daily Lewanda Rife, MD   81 mg at 06/01/23 9604   haloperidol (HALDOL) tablet 5 mg  5 mg Oral TID PRN Motley-Mangrum, [name], PMHNP   5 mg at 05/09/23 5409   And   diphenhydrAMINE (BENADRYL) capsule 50 mg  50 mg Oral TID PRN Motley-Mangrum, [name], PMHNP   50 mg at 05/09/23 0834   haloperidol lactate (HALDOL) injection 10 mg  10 mg Intramuscular TID PRN Motley-Mangrum, [name], PMHNP   10 mg at 05/19/23 1242   And   diphenhydrAMINE (BENADRYL) injection 50 mg  50 mg Intramuscular TID PRN Motley-Mangrum, [name], PMHNP       And   LORazepam (ATIVAN) injection 2 mg  2 mg Intramuscular TID PRN Motley-Mangrum, [name], PMHNP       haloperidol lactate (HALDOL) injection 5 mg  5 mg Intramuscular TID PRN Motley-Mangrum, [name], PMHNP       And   diphenhydrAMINE (BENADRYL) injection 50 mg  50 mg Intramuscular TID PRN Motley-Mangrum, [name], PMHNP   50 mg at 05/19/23 1240   And   LORazepam (ATIVAN) injection 2 mg  2 mg Intramuscular TID PRN Motley-Mangrum, [name], PMHNP   2 mg at 05/19/23 1240   divalproex (DEPAKOTE ER) 24 hr tablet 500 mg  500 mg Oral BID Motley-Mangrum, [name], PMHNP   500 mg at 06/01/23 1706   gabapentin (NEURONTIN) capsule 400 mg  400 mg Oral BID Motley-Mangrum, [name], PMHNP   400 mg at 06/01/23 2116   hydrOXYzine (ATARAX) tablet 25 mg  25 mg Oral TID PRN Motley-Mangrum, [name], PMHNP   25 mg at 05/22/23 2059   LORazepam (ATIVAN) tablet 1 mg  1 mg Oral Q8H PRN Motley-Mangrum, [name], PMHNP   1 mg at 05/09/23 0833   magnesium hydroxide (MILK OF MAGNESIA) suspension 30 mL  30 mL Oral Daily PRN Motley-Mangrum, [name], PMHNP       OLANZapine  (ZYPREXA) tablet 15 mg  15 mg Oral QHS Lewanda Rife, MD   15 mg at 06/01/23 2116   traZODone (DESYREL) tablet 100 mg  100 mg Oral QHS PRN Motley-Mangrum, [name], PMHNP   100 mg at 06/01/23 2116   valbenazine ([name]) capsule 40 mg  40 mg Oral Daily [name], DO   40 mg at 06/01/23 8119    Lab Results:  No results found for this or any previous visit (from the past 48 hours).   Blood Alcohol level:  Lab Results  Component Value Date   ETH <  10 04/25/2023   ETH <10 12/31/2022    Metabolic Disorder Labs: Lab Results  Component Value Date   HGBA1C 5.8 (H) 05/28/2023   MPG 119.76 05/28/2023   MPG 111.15 12/31/2022   No results found for: "PROLACTIN" Lab Results  Component Value Date   CHOL 133 12/31/2022   TRIG 39 12/31/2022   HDL 54 12/31/2022   CHOLHDL 2.5 12/31/2022   VLDL 8 12/31/2022   LDLCALC 71 12/31/2022   LDLCALC 69 12/01/2022    COWS:     Musculoskeletal: Strength & Muscle Tone: within normal limits Gait & Station: normal Patient leans: N/A  Psychiatric Specialty Exam:  Presentation  General Appearance:  Appropriate for Environment; Casual; Fairly Groomed  Eye Contact: Good  Speech: Clear and Coherent; Normal Rate  Speech Volume: Normal  Handedness: Right   Mood and Affect  Mood: "Fine"  Affect: Appropriate; Congruent   Thought Process  Thought Processes: Coherent  Descriptions of Associations:Intact  Orientation:Full (Time, Place and Person)  Thought Content:Logical  History of Schizophrenia/Schizoaffective disorder:Yes  Duration of Psychotic Symptoms:Greater than six months  Hallucinations:Denies  Ideas of Reference:None  Suicidal Thoughts:Denies  Homicidal Thoughts:Denies   Sensorium  Memory: Immediate Good; Recent Good  Judgment: Fair  Insight: Fair   Art therapist  Concentration: Good  Attention Span: Good   Language: Good   Psychomotor Activity  Psychomotor  Activity: No data recorded    Assets  Assets: Communication Skills; Desire for Improvement; Physical Health; Resilience; Social Support   Sleep  Sleep: Fair     Physical Exam: Physical Exam Vitals and nursing note reviewed.  HENT:     Head: Normocephalic and atraumatic.     Nose: Nose normal.  Eyes:     Extraocular Movements: Extraocular movements intact.  Pulmonary:     Effort: Pulmonary effort is normal.  Musculoskeletal:        General: Normal range of motion.     Cervical back: Normal range of motion.  Skin:    General: Skin is warm and dry.  Neurological:     Mental Status: He is alert and oriented to person, place, and time. Mental status is at baseline.  Psychiatric:        Mood and Affect: Mood normal.        Behavior: Behavior normal.        Thought Content: Thought content normal.        Judgment: Judgment normal.    Review of Systems  All other systems reviewed and are negative.  Blood pressure (!) 141/81, pulse 68, temperature 97.8 F (36.6 C), resp. rate 20, height 5\' 11"  (1.803 m), weight 71.2 kg, SpO2 100%. Body mass index is 21.9 kg/m.   Treatment Plan Summary: Daily contact with patient to assess and evaluate symptoms and progress in treatment and Medication management  Continue olanzapine 15 mg at bedtime for schizophrenia Continue Depakote 500 mg po BID for mood Continue [name] 40 mg p.o. daily for TD Continue Norvasc 5 mg by mouth daily for HTN   Valproic acid level : 60, ALT/AST 10/16, QTC 438 05/28/23: HgbA1c 5.8  Patient is awaiting placement  Lewanda Rife, MD

## 2023-06-01 NOTE — Progress Notes (Signed)
   06/01/23 0945  Psych Admission Type (Psych Patients Only)  Admission Status Involuntary  Psychosocial Assessment  Patient Complaints None  Eye Contact Fair  Facial Expression Animated  Affect Appropriate to circumstance  Speech Logical/coherent  Interaction Assertive  Motor Activity Fidgety;Tremors  Appearance/Hygiene Unremarkable  Behavior Characteristics Cooperative;Appropriate to situation  Mood Pleasant (patient states that overall he is "feeling good".)  Aggressive Behavior  Effect No apparent injury  Thought Process  Coherency WDL  Content WDL  Delusions None reported or observed  Perception WDL  Hallucination None reported or observed  Judgment WDL  Confusion None  Danger to Self  Current suicidal ideation? Denies  Self-Injurious Behavior No self-injurious ideation or behavior indicators observed or expressed   Agreement Not to Harm Self Yes  Description of Agreement Verbal  Danger to Others  Danger to Others None reported or observed

## 2023-06-01 NOTE — Progress Notes (Signed)
Pt calm and pleasant during assessment denying SI/HI/AVH. Pt observed by this Clinical research associate interacting appropriately with staff and peers on the unit. Pt compliant with medication administration per MD orders. Pt given education, support, and encouragement to be active in his treatment plan. Pt being monitored Q 15 minutes for safety per unit protocol, remains safe on the unit

## 2023-06-01 NOTE — BHH Counselor (Signed)
CSW contacted the non-emergency number for [facility] [phone].  CSW requested a wellness check and provided contact information.  CSW received a call from [employer] who reports that the wellness check had been completed.  He reports that no one answered the door at the home.  He reported that he could see tracks near a vehicle indicating that someone had been moving about.  He also reported that the mail was not in the mailbox, indicating that someone had checked it.  He reports that no calls had come from the home since September.  He reports that he is familiar with the patient and family from previous calls.  He attempted to contact the patient to same result as this Clinical research associate, no answer and voicemail not set up.  He reports that in past visits the patients father appeared "frail".    He reports that the officers are limited on their ability to enter the home, however, he will reach out to this clinician should he learn more.  Penni Homans, MSW, LCSW 06/01/2023 10:42 AM

## 2023-06-01 NOTE — Plan of Care (Signed)
  Problem: Education: Goal: Knowledge of [facility] Education information/materials will improve Outcome: Progressing Goal: Emotional status will improve Outcome: Progressing Goal: Mental status will improve Outcome: Progressing Goal: Verbalization of understanding the information provided will improve Outcome: Progressing   Problem: Education: Goal: Emotional status will improve Outcome: Progressing Goal: Mental status will improve Outcome: Progressing   Problem: Coping: Goal: Ability to verbalize frustrations and anger appropriately will improve Outcome: Progressing Goal: Ability to demonstrate self-control will improve Outcome: Progressing   Problem: Health Behavior/Discharge Planning: Goal: Identification of resources available to assist in meeting health care needs will improve Outcome: Progressing Goal: Compliance with treatment plan for underlying cause of condition will improve Outcome: Progressing   Problem: Physical Regulation: Goal: Ability to maintain clinical measurements within normal limits will improve Outcome: Progressing   Problem: Safety: Goal: Periods of time without injury will increase Outcome: Progressing   Problem: Education: Goal: Knowledge of General Education information will improve Description: Including pain rating scale, medication(s)/side effects and non-pharmacologic comfort measures Outcome: Progressing   Problem: Health Behavior/Discharge Planning: Goal: Ability to manage health-related needs will improve Outcome: Progressing   Problem: Clinical Measurements: Goal: Ability to maintain clinical measurements within normal limits will improve Outcome: Progressing Goal: Will remain free from infection Outcome: Progressing Goal: Diagnostic test results will improve Outcome: Progressing Goal: Respiratory complications will improve Outcome: Progressing Goal: Cardiovascular complication will be avoided Outcome: Progressing    Problem: Activity: Goal: Risk for activity intolerance will decrease Outcome: Progressing   Problem: Nutrition: Goal: Adequate nutrition will be maintained Outcome: Progressing   Problem: Coping: Goal: Level of anxiety will decrease Outcome: Progressing   Problem: Elimination: Goal: Will not experience complications related to bowel motility Outcome: Progressing Goal: Will not experience complications related to urinary retention Outcome: Progressing   Problem: Pain Management: Goal: General experience of comfort will improve Outcome: Progressing   Problem: Safety: Goal: Ability to remain free from injury will improve Outcome: Progressing   Problem: Skin Integrity: Goal: Risk for impaired skin integrity will decrease Outcome: Progressing

## 2023-06-01 NOTE — Group Note (Signed)
Recreation Therapy Group Note   Group Topic:Goal Setting  Group Date: 06/01/2023 Start Time: 1010 End Time: 1100 Facilitators: Rosina Lowenstein, LRT, CTRS Location:  Craft Room  Group Description: Product/process development scientist. Patients were given many different magazines, a glue stick, markers, and a piece of cardstock paper. LRT and pts discussed the importance of having goals in life. LRT and pts discussed the difference between short-term and long-term goals, as well as what a SMART goal is. LRT encouraged pts to create a vision board, with images they picked and then cut out with safety scissors from the magazine, for themselves, that capture their short and long-term goals. LRT encouraged pts to show and explain their vision board to the group.   Goal Area(s) Addressed:  Patient will gain knowledge of short vs. long term goals.  Patient will identify goals for themselves. Patient will practice setting SMART goals. Patient will verbalize their goals to LRT and peers.   Affect/Mood: N/A   Participation Level: Did not attend    Clinical Observations/Individualized Feedback: Patient did not attend group.   Plan: Continue to engage patient in RT group sessions 2-3x/week.   Rosina Lowenstein, LRT, CTRS 06/01/2023 1:00 PM

## 2023-06-01 NOTE — BHH Counselor (Signed)
CSW assisted patient in completing an interview with Doristine Mango.   CSW was informed that Calton Dach needs additional information on the patent's funding source.   CSW has attempted several times, however, with no success in reaching the parent.    Penni Homans, MSW, LCSW 06/01/2023 10:49 AM

## 2023-06-01 NOTE — Group Note (Signed)
Date:  06/01/2023 Time:  10:26 AM  Group Topic/Focus:  Goals Group:   The focus of this group is to help patients establish daily goals to achieve during treatment and discuss how the patient can incorporate goal setting into their daily lives to aide in recovery.    Participation Level:  Did Not Attend   Lynelle Smoke [facility] 06/01/2023, 10:26 AM

## 2023-06-02 DIAGNOSIS — F200 Paranoid schizophrenia: Secondary | ICD-10-CM | POA: Diagnosis not present

## 2023-06-02 NOTE — Group Note (Signed)
Date:  06/02/2023 Time:  10:21 AM  Group Topic/Focus:  Early Warning Signs:   The focus of this group is to help patients identify signs or symptoms they exhibit before slipping into an unhealthy state or crisis.    Participation Level:  Did Not Attend   Mark [name] 06/02/2023, 10:21 AM

## 2023-06-02 NOTE — BHH Counselor (Signed)
CSW confirmed that patient remains on the waitlist for [facility].  CSW spoke with Selena Batten.    Penni Homans, MSW, LCSW 06/02/2023 10:25 AM

## 2023-06-02 NOTE — Group Note (Signed)
Date:  06/02/2023 Time:  4:07 PM  Group Topic/Focus:  Activity Group: The focus of the group is to promote activity for the patients and encourage them to go outside to the courtyard for fresh air and exercise.    Participation Level:  Did Not Attend   Mark [name] 06/02/2023, 4:07 PM

## 2023-06-02 NOTE — Progress Notes (Signed)
Patient denied any hallucinations to this writer on assessment. However, upon doing rounds, this Clinical research associate overheard patient in his room having a full blown conversation with himself.   06/02/23 1430  Thought Process  Coherency WDL  Content Preoccupation  Delusions None reported or observed  Perception Hallucinations  Hallucination Auditory  Judgment WDL  Confusion None

## 2023-06-02 NOTE — Progress Notes (Signed)
[facility] MD Progress Note  05/28/2023  Mark [name]  MRN:  [id number]  Subjective:  Mark [name] is a 63 year old African American male with a history of schizophrenia, HTN, and COPD who is here due to aggressive behavior toward his father. He has been living with father in [place]. Per father, there is an order of protection in place.  Chart reviewed, and patient seen during rounds. No acute changes or behavioral disturbances overnight. Today patient reports" fine" mood. Social work assisting with disposition as patient is unable to return to father's home and is not appropriate for shelter.  Patient was encouraged to call Social Security office to get more info on patient's Social Security income.  Today patient denies thoughts of harming himself or others.  He denies auditory visual hallucinations.  Reports his sleep and appetite has improved.  Patient was encouraged to attend group and work on coping strategies.   Principal Problem: Paranoid schizophrenia (HCC) Diagnosis: Principal Problem:   Paranoid schizophrenia (HCC)  Total Time spent with patient: 20 minutes  Past Psychiatric History: see h&p  Past Medical History:  Past Medical History:  Diagnosis Date   COPD (chronic obstructive pulmonary disease) (HCC)    Hypertension    Schizophrenia (HCC)     Past Surgical History:  Procedure Laterality Date   LOWER EXTREMITY ANGIOGRAPHY N/A 09/18/2017   Procedure: LOWER EXTREMITY ANGIOGRAPHY- Recheck lysis;  Surgeon: Maeola Harman, MD;  Location: [facility] INVASIVE CV LAB;  Service: Cardiovascular;  Laterality: N/A;   PERIPHERAL VASCULAR INTERVENTION Left 09/18/2017   Procedure: PERIPHERAL VASCULAR INTERVENTION;  Surgeon: Maeola Harman, MD;  Location: [facility] INVASIVE CV LAB;  Service: Cardiovascular;  Laterality: Left;   THROMBECTOMY FEMORAL ARTERY Left 09/17/2017   Procedure: POSSIBLE THROMBECTOMY;  Surgeon: Maeola Harman, MD;  Location: [facility] OR;  Service:  Vascular;  Laterality: Left;   VENOGRAM Left 09/17/2017   Procedure: ULTRASOUND POPLITEAL ACCESS; CENTRAL VENOGRAM, IVVS, LYSIS CATHETER PLACEMENT;  Surgeon: Maeola Harman, MD;  Location: [facility] OR;  Service: Vascular;  Laterality: Left;   Family History: History reviewed. No pertinent family history. Family Psychiatric  History: see h&p Social History:  Social History   Substance and Sexual Activity  Alcohol Use No     Social History   Substance and Sexual Activity  Drug Use Never    Social History   Socioeconomic History   Marital status: Single    Spouse name: Not on file   Number of children: Not on file   Years of education: Not on file   Highest education level: Not on file  Occupational History   Not on file  Tobacco Use   Smoking status: Every Day    Current packs/day: 0.50    Types: Cigarettes   Smokeless tobacco: Never  Vaping Use   Vaping status: Never Used  Substance and Sexual Activity   Alcohol use: No   Drug use: Never   Sexual activity: Not Currently  Other Topics Concern   Not on file  Social History Narrative   Not on file   Social Drivers of Health   Financial Resource Strain: Not on file  Food Insecurity: No Food Insecurity (04/27/2023)   Hunger Vital Sign    Worried About Running Out of Food in the Last Year: Never true    Ran Out of Food in the Last Year: Never true  Transportation Needs: No Transportation Needs (04/27/2023)   PRAPARE - Administrator, Civil Service (Medical): No  Lack of Transportation (Non-Medical): No  Physical Activity: Not on file  Stress: Not on file  Social Connections: Not on file   Additional Social History:   Patient is homeless                      Sleep: Good  Appetite:  Good  Current Medications: Current Facility-Administered Medications  Medication Dose Route Frequency Provider Last Rate Last Admin   acetaminophen (TYLENOL) tablet 650 mg  650 mg Oral Q6H PRN  Motley-Mangrum, [name], PMHNP       alum & mag hydroxide-simeth (MAALOX/MYLANTA) 200-200-20 MG/5ML suspension 30 mL  30 mL Oral Q4H PRN Motley-Mangrum, [name], PMHNP   30 mL at 06/01/23 1354   amantadine (SYMMETREL) capsule 100 mg  100 mg Oral BID Lewanda Rife, MD   100 mg at 06/02/23 0927   amLODipine (NORVASC) tablet 5 mg  5 mg Oral Daily Lewanda Rife, MD   5 mg at 06/02/23 1610   aspirin EC tablet 81 mg  81 mg Oral Daily Lewanda Rife, MD   81 mg at 06/02/23 9604   haloperidol (HALDOL) tablet 5 mg  5 mg Oral TID PRN Motley-Mangrum, [name], PMHNP   5 mg at 05/09/23 5409   And   diphenhydrAMINE (BENADRYL) capsule 50 mg  50 mg Oral TID PRN Motley-Mangrum, [name], PMHNP   50 mg at 05/09/23 0834   haloperidol lactate (HALDOL) injection 10 mg  10 mg Intramuscular TID PRN Motley-Mangrum, [name], PMHNP   10 mg at 05/19/23 1242   And   diphenhydrAMINE (BENADRYL) injection 50 mg  50 mg Intramuscular TID PRN Motley-Mangrum, [name], PMHNP       And   LORazepam (ATIVAN) injection 2 mg  2 mg Intramuscular TID PRN Motley-Mangrum, [name], PMHNP       haloperidol lactate (HALDOL) injection 5 mg  5 mg Intramuscular TID PRN Motley-Mangrum, [name], PMHNP       And   diphenhydrAMINE (BENADRYL) injection 50 mg  50 mg Intramuscular TID PRN Motley-Mangrum, [name], PMHNP   50 mg at 05/19/23 1240   And   LORazepam (ATIVAN) injection 2 mg  2 mg Intramuscular TID PRN Motley-Mangrum, [name], PMHNP   2 mg at 05/19/23 1240   divalproex (DEPAKOTE ER) 24 hr tablet 500 mg  500 mg Oral BID Motley-Mangrum, [name], PMHNP   500 mg at 06/02/23 0927   gabapentin (NEURONTIN) capsule 400 mg  400 mg Oral BID Motley-Mangrum, [name], PMHNP   400 mg at 06/02/23 8119   hydrOXYzine (ATARAX) tablet 25 mg  25 mg Oral TID PRN Motley-Mangrum, [name], PMHNP   25 mg at 05/22/23 2059   LORazepam (ATIVAN) tablet 1 mg  1 mg Oral Q8H PRN Motley-Mangrum, [name], PMHNP   1 mg at 05/09/23 0833   magnesium  hydroxide (MILK OF MAGNESIA) suspension 30 mL  30 mL Oral Daily PRN Motley-Mangrum, [name], PMHNP       OLANZapine (ZYPREXA) tablet 15 mg  15 mg Oral QHS Lewanda Rife, MD   15 mg at 06/01/23 2116   traZODone (DESYREL) tablet 100 mg  100 mg Oral QHS PRN Motley-Mangrum, [name], PMHNP   100 mg at 06/01/23 2116   valbenazine ([name]) capsule 40 mg  40 mg Oral Daily [name], DO   40 mg at 06/02/23 1478    Lab Results:  No results found for this or any previous visit (from the  past 48 hours).   Blood Alcohol level:  Lab Results  Component Value Date   ETH <10 04/25/2023   ETH <10 12/31/2022    Metabolic Disorder Labs: Lab Results  Component Value Date   HGBA1C 5.8 (H) 05/28/2023   MPG 119.76 05/28/2023   MPG 111.15 12/31/2022   No results found for: "PROLACTIN" Lab Results  Component Value Date   CHOL 133 12/31/2022   TRIG 39 12/31/2022   HDL 54 12/31/2022   CHOLHDL 2.5 12/31/2022   VLDL 8 12/31/2022   LDLCALC 71 12/31/2022   LDLCALC 69 12/01/2022    COWS:     Musculoskeletal: Strength & Muscle Tone: within normal limits Gait & Station: normal Patient leans: N/A  Psychiatric Specialty Exam:  Presentation  General Appearance:  Appropriate for Environment; Casual; Fairly Groomed  Eye Contact: Good  Speech: Clear and Coherent; Normal Rate  Speech Volume: Normal  Handedness: Right   Mood and Affect  Mood: "Fine"  Affect: Appropriate; Congruent   Thought Process  Thought Processes: Coherent  Descriptions of Associations:Intact  Orientation:Full (Time, Place and Person)  Thought Content:Logical  History of Schizophrenia/Schizoaffective disorder:Yes  Duration of Psychotic Symptoms:Greater than six months  Hallucinations:Denies  Ideas of Reference:None  Suicidal Thoughts:Denies  Homicidal Thoughts:Denies   Sensorium  Memory: Immediate Good; Recent Good  Judgment: Fair  Insight: Fair   Restaurant manager, fast food  Concentration: Good  Attention Span: Good   Language: Good   Psychomotor Activity  Psychomotor Activity: No data recorded    Assets  Assets: Communication Skills; Desire for Improvement; Physical Health; Resilience; Social Support   Sleep  Sleep: Fair     Physical Exam: Physical Exam Vitals and nursing note reviewed.  HENT:     Head: Normocephalic and atraumatic.     Nose: Nose normal.  Eyes:     Extraocular Movements: Extraocular movements intact.  Pulmonary:     Effort: Pulmonary effort is normal.  Musculoskeletal:        General: Normal range of motion.     Cervical back: Normal range of motion.  Skin:    General: Skin is warm and dry.  Neurological:     Mental Status: He is alert and oriented to person, place, and time. Mental status is at baseline.  Psychiatric:        Mood and Affect: Mood normal.        Behavior: Behavior normal.        Thought Content: Thought content normal.        Judgment: Judgment normal.    Review of Systems  All other systems reviewed and are negative.  Blood pressure 107/71, pulse 61, temperature (!) 97.5 F (36.4 C), resp. rate 18, height 5\' 11"  (1.803 m), weight 71.2 kg, SpO2 100%. Body mass index is 21.9 kg/m.   Treatment Plan Summary: Daily contact with patient to assess and evaluate symptoms and progress in treatment and Medication management  Continue olanzapine 15 mg at bedtime for schizophrenia Continue Depakote 500 mg po BID for mood Continue [name] 40 mg p.o. daily for TD Continue Norvasc 5 mg by mouth daily for HTN   Valproic acid level : 60, ALT/AST 10/16, QTC 438 05/28/23: HgbA1c 5.8  Patient is awaiting placement  Lewanda Rife, MD

## 2023-06-02 NOTE — BHH Counselor (Signed)
CSW spoke with the patient's father.  Father reports that the patient receives a disability check of about $900.  Father reports that he is the patient's payee.    He maintains that the restraining order is in place and the patient can not return to the home at this time.   Penni Homans, MSW, LCSW 06/02/2023 10:24 AM

## 2023-06-02 NOTE — Plan of Care (Signed)
  Problem: Education: Goal: Knowledge of [facility] Education information/materials will improve Outcome: Progressing Goal: Emotional status will improve Outcome: Progressing Goal: Mental status will improve Outcome: Progressing Goal: Verbalization of understanding the information provided will improve Outcome: Progressing   Problem: Education: Goal: Emotional status will improve Outcome: Progressing Goal: Mental status will improve Outcome: Progressing   Problem: Coping: Goal: Ability to verbalize frustrations and anger appropriately will improve Outcome: Progressing Goal: Ability to demonstrate self-control will improve Outcome: Progressing   Problem: Health Behavior/Discharge Planning: Goal: Identification of resources available to assist in meeting health care needs will improve Outcome: Progressing Goal: Compliance with treatment plan for underlying cause of condition will improve Outcome: Progressing   Problem: Physical Regulation: Goal: Ability to maintain clinical measurements within normal limits will improve Outcome: Progressing   Problem: Safety: Goal: Periods of time without injury will increase Outcome: Progressing   Problem: Education: Goal: Knowledge of General Education information will improve Description: Including pain rating scale, medication(s)/side effects and non-pharmacologic comfort measures Outcome: Progressing   Problem: Health Behavior/Discharge Planning: Goal: Ability to manage health-related needs will improve Outcome: Progressing   Problem: Clinical Measurements: Goal: Ability to maintain clinical measurements within normal limits will improve Outcome: Progressing Goal: Will remain free from infection Outcome: Progressing Goal: Diagnostic test results will improve Outcome: Progressing Goal: Respiratory complications will improve Outcome: Progressing Goal: Cardiovascular complication will be avoided Outcome: Progressing    Problem: Activity: Goal: Risk for activity intolerance will decrease Outcome: Progressing   Problem: Nutrition: Goal: Adequate nutrition will be maintained Outcome: Progressing   Problem: Coping: Goal: Level of anxiety will decrease Outcome: Progressing   Problem: Elimination: Goal: Will not experience complications related to bowel motility Outcome: Progressing Goal: Will not experience complications related to urinary retention Outcome: Progressing   Problem: Pain Management: Goal: General experience of comfort will improve Outcome: Progressing   Problem: Safety: Goal: Ability to remain free from injury will improve Outcome: Progressing   Problem: Skin Integrity: Goal: Risk for impaired skin integrity will decrease Outcome: Progressing

## 2023-06-02 NOTE — Progress Notes (Signed)
Pt calm and pleasant during assessment denying SI/HI/AVH. Pt observed by this Clinical research associate interacting appropriately with staff and peers on the unit. Pt compliant with medication administration per MD orders. Pt given education, support, and encouragement to be active in his treatment plan. Pt being monitored Q 15 minutes for safety per unit protocol, remains safe on the unit

## 2023-06-02 NOTE — Group Note (Signed)
Recreation Therapy Group Note   Group Topic:Leisure Education  Group Date: 06/02/2023 Start Time: 1000 End Time: 1100 Facilitators: Rosina Lowenstein, LRT, CTRS Location:  Craft Room  Group Description: Leisure. Patients were given the option to choose from singing karaoke, coloring mandalas, using oil pastels, journaling, or playing with play-doh. LRT and pts discussed the meaning of leisure, the importance of participating in leisure during their free time/when they're outside of the hospital, as well as how our leisure interests can also serve as coping skills.   Goal Area(s) Addressed:  Patient will identify a current leisure interest.  Patient will learn the definition of "leisure". Patient will practice making a positive decision. Patient will have the opportunity to try a new leisure activity. Patient will communicate with peers and LRT.    Affect/Mood: Appropriate   Participation Level: Minimal    Clinical Observations/Individualized Feedback: Mark [name] came late to group.Pt sat and listened to music while present. Pt minimally interacted with LRT and peers duration of session.   Plan: Continue to engage patient in RT group sessions 2-3x/week.   Rosina Lowenstein, LRT, CTRS 06/02/2023 12:11 PM

## 2023-06-02 NOTE — [facility] Counselor (Signed)
Pt was accepted to [facility] group home for next week pending the following:    TB Skin Test   LOG for $1000 dollars   And MAR and Facesheet sent over.    CSW will reach out to leadership for guidance on LOG.   Reynaldo Minium, MSW, [place] 06/02/2023 3:32 PM

## 2023-06-02 NOTE — Progress Notes (Signed)
   06/02/23 1100  Psych Admission Type (Psych Patients Only)  Admission Status Involuntary  Psychosocial Assessment  Patient Complaints None  Eye Contact Fair;Watchful  Facial Expression Animated  Affect Appropriate to circumstance  Speech Logical/coherent  Interaction Assertive  Motor Activity Fidgety;Tremors  Appearance/Hygiene Unremarkable  Behavior Characteristics Cooperative;Appropriate to situation  Mood Pleasant (patient states that overall, he is feeling "alright".)  Aggressive Behavior  Effect No apparent injury  Thought Process  Coherency WDL  Content WDL  Delusions None reported or observed  Perception WDL  Hallucination None reported or observed  Judgment WDL  Confusion None  Danger to Self  Current suicidal ideation? Denies  Self-Injurious Behavior No self-injurious ideation or behavior indicators observed or expressed   Agreement Not to Harm Self Yes  Description of Agreement Verbal  Danger to Others  Danger to Others None reported or observed

## 2023-06-02 NOTE — BH IP Treatment Plan (Signed)
Interdisciplinary Treatment and Diagnostic Plan Update  06/02/2023 Time of Session: 8:30AM Mark COPHER MRN: [id number]  Principal Diagnosis: Paranoid schizophrenia [facility])  Secondary Diagnoses: Principal Problem:   Paranoid schizophrenia (HCC)   Current Medications:  Current Facility-Administered Medications  Medication Dose Route Frequency Provider Last Rate Last Admin   acetaminophen (TYLENOL) tablet 650 mg  650 mg Oral Q6H PRN Motley-Mangrum, [name], PMHNP       alum & mag hydroxide-simeth (MAALOX/MYLANTA) 200-200-20 MG/5ML suspension 30 mL  30 mL Oral Q4H PRN Motley-Mangrum, [name], PMHNP   30 mL at 06/01/23 1354   amantadine (SYMMETREL) capsule 100 mg  100 mg Oral BID Lewanda Rife, MD   100 mg at 06/02/23 0927   amLODipine (NORVASC) tablet 5 mg  5 mg Oral Daily Lewanda Rife, MD   5 mg at 06/02/23 4098   aspirin EC tablet 81 mg  81 mg Oral Daily Lewanda Rife, MD   81 mg at 06/02/23 1191   haloperidol (HALDOL) tablet 5 mg  5 mg Oral TID PRN Motley-Mangrum, [name], PMHNP   5 mg at 05/09/23 4782   And   diphenhydrAMINE (BENADRYL) capsule 50 mg  50 mg Oral TID PRN Motley-Mangrum, [name], PMHNP   50 mg at 05/09/23 0834   haloperidol lactate (HALDOL) injection 10 mg  10 mg Intramuscular TID PRN Motley-Mangrum, [name], PMHNP   10 mg at 05/19/23 1242   And   diphenhydrAMINE (BENADRYL) injection 50 mg  50 mg Intramuscular TID PRN Motley-Mangrum, [name], PMHNP       And   LORazepam (ATIVAN) injection 2 mg  2 mg Intramuscular TID PRN Motley-Mangrum, [name], PMHNP       haloperidol lactate (HALDOL) injection 5 mg  5 mg Intramuscular TID PRN Motley-Mangrum, [name], PMHNP       And   diphenhydrAMINE (BENADRYL) injection 50 mg  50 mg Intramuscular TID PRN Motley-Mangrum, [name], PMHNP   50 mg at 05/19/23 1240   And   LORazepam (ATIVAN) injection 2 mg  2 mg Intramuscular TID PRN Motley-Mangrum, [name], PMHNP   2 mg at 05/19/23 1240   divalproex (DEPAKOTE ER)  24 hr tablet 500 mg  500 mg Oral BID Motley-Mangrum, [name], PMHNP   500 mg at 06/02/23 0927   gabapentin (NEURONTIN) capsule 400 mg  400 mg Oral BID Motley-Mangrum, [name], PMHNP   400 mg at 06/02/23 9562   hydrOXYzine (ATARAX) tablet 25 mg  25 mg Oral TID PRN Motley-Mangrum, [name], PMHNP   25 mg at 05/22/23 2059   LORazepam (ATIVAN) tablet 1 mg  1 mg Oral Q8H PRN Motley-Mangrum, [name], PMHNP   1 mg at 05/09/23 0833   magnesium hydroxide (MILK OF MAGNESIA) suspension 30 mL  30 mL Oral Daily PRN Motley-Mangrum, [name], PMHNP       OLANZapine (ZYPREXA) tablet 15 mg  15 mg Oral QHS Lewanda Rife, MD   15 mg at 06/01/23 2116   traZODone (DESYREL) tablet 100 mg  100 mg Oral QHS PRN Motley-Mangrum, [name], PMHNP   100 mg at 06/01/23 2116   valbenazine ([name]) capsule 40 mg  40 mg Oral Daily [name], DO   40 mg at 06/02/23 1308   PTA Medications: Medications Prior to Admission  Medication Sig Dispense Refill Last Dose/Taking   amantadine (SYMMETREL) 100 MG capsule Take 100 mg by mouth 2 (two) times daily.      amLODipine (NORVASC) 5 MG tablet Take  1 tablet (5 mg total) by mouth daily.      aspirin EC 81 MG tablet Take 81 mg by mouth daily. Swallow whole.      benztropine (COGENTIN) 1 MG tablet Take 1 tablet (1 mg total) by mouth 2 (two) times daily. 60 tablet 0    cyclobenzaprine (FLEXERIL) 10 MG tablet Take 10 mg by mouth 2 (two) times daily as needed.      divalproex (DEPAKOTE ER) 500 MG 24 hr tablet Take 1 tablet (500 mg total) by mouth 2 (two) times daily. (Patient not taking: Reported on 04/25/2023) 60 tablet 0    gabapentin (NEURONTIN) 300 MG capsule Take 300 mg by mouth 3 (three) times daily. (Patient not taking: Reported on 04/25/2023)      gabapentin (NEURONTIN) 400 MG capsule Take 1 capsule (400 mg total) by mouth 3 (three) times daily. (Patient not taking: Reported on 04/25/2023) 90 capsule 0    haloperidol (HALDOL) 10 MG tablet Take 1 tablet (10 mg  total) by mouth 2 (two) times daily. (Patient not taking: Reported on 04/25/2023)      traZODone (DESYREL) 150 MG tablet Take 1 tablet (150 mg total) by mouth at bedtime. (Patient not taking: Reported on 04/25/2023) 30 tablet 0     Patient Stressors: Marital or family conflict   Medication change or noncompliance    Patient Strengths: Supportive family/friends   Treatment Modalities: Medication Management, Group therapy, Case management,  1 to 1 session with clinician, Psychoeducation, Recreational therapy.   Physician Treatment Plan for Primary Diagnosis: Paranoid schizophrenia (HCC) Long Term Goal(s): Improvement in symptoms so as ready for discharge   Short Term Goals: Ability to identify changes in lifestyle to reduce recurrence of condition will improve Ability to verbalize feelings will improve Ability to disclose and discuss suicidal ideas Ability to demonstrate self-control will improve Ability to identify and develop effective coping behaviors will improve Ability to maintain clinical measurements within normal limits will improve Compliance with prescribed medications will improve Ability to identify triggers associated with substance abuse/mental health issues will improve  Medication Management: Evaluate patient's response, side effects, and tolerance of medication regimen.  Therapeutic Interventions: 1 to 1 sessions, Unit Group sessions and Medication administration.  Evaluation of Outcomes: Progressing  Physician Treatment Plan for Secondary Diagnosis: Principal Problem:   Paranoid schizophrenia (HCC)  Long Term Goal(s): Improvement in symptoms so as ready for discharge   Short Term Goals: Ability to identify changes in lifestyle to reduce recurrence of condition will improve Ability to verbalize feelings will improve Ability to disclose and discuss suicidal ideas Ability to demonstrate self-control will improve Ability to identify and develop effective coping  behaviors will improve Ability to maintain clinical measurements within normal limits will improve Compliance with prescribed medications will improve Ability to identify triggers associated with substance abuse/mental health issues will improve     Medication Management: Evaluate patient's response, side effects, and tolerance of medication regimen.  Therapeutic Interventions: 1 to 1 sessions, Unit Group sessions and Medication administration.  Evaluation of Outcomes: Progressing   RN Treatment Plan for Primary Diagnosis: Paranoid schizophrenia (HCC) Long Term Goal(s): Knowledge of disease and therapeutic regimen to maintain health will improve  Short Term Goals: Ability to verbalize frustration and anger appropriately will improve, Ability to demonstrate self-control, Ability to participate in decision making will improve, Ability to verbalize feelings will improve, Ability to identify and develop effective coping behaviors will improve, and Compliance with prescribed medications will improve  Medication Management: RN will administer  medications as ordered by provider, will assess and evaluate patient's response and provide education to patient for prescribed medication. RN will report any adverse and/or side effects to prescribing provider.  Therapeutic Interventions: 1 on 1 counseling sessions, Psychoeducation, Medication administration, Evaluate responses to treatment, Monitor vital signs and CBGs as ordered, Perform/monitor CIWA, COWS, AIMS and Fall Risk screenings as ordered, Perform wound care treatments as ordered.  Evaluation of Outcomes: Progressing   LCSW Treatment Plan for Primary Diagnosis: Paranoid schizophrenia (HCC) Long Term Goal(s): Safe transition to appropriate next level of care at discharge, Engage patient in therapeutic group addressing interpersonal concerns.  Short Term Goals: Engage patient in aftercare planning with referrals and resources, Increase social  support, Increase ability to appropriately verbalize feelings, Increase emotional regulation, Facilitate acceptance of mental health diagnosis and concerns, and Increase skills for wellness and recovery  Therapeutic Interventions: Assess for all discharge needs, 1 to 1 time with Social worker, Explore available resources and support systems, Assess for adequacy in community support network, Educate family and significant other(s) on suicide prevention, Complete Psychosocial Assessment, Interpersonal group therapy.  Evaluation of Outcomes: Progressing   Progress in Treatment: Attending groups: Yes. Participating in groups: Yes. Taking medication as prescribed: Yes. Toleration medication: Yes. Family/Significant other contact made: Yes, individual(s) contacted:  SPE completed with the patient and his father.  Patient understands diagnosis: No. Discussing patient identified problems/goals with staff: Yes. Medical problems stabilized or resolved: Yes. Denies suicidal/homicidal ideation: Yes. Issues/concerns per patient self-inventory: No. Other: none  New problem(s) identified: No, Describe:  None identified Update 05/03/23: No changes at this time. Update 05/08/23: No changes at this time. Update 05/13/2023: No changes at this time. Update 05/18/2023: No changes at this time. Update 05/23/23: No changes at this time. Update 05/28/23: No changes at this time.  Update 06/02/23:  No changes at this time.    New Short Term/Long Term Goal(s):  elimination of symptoms of psychosis, medication management for mood stabilization; elimination of SI thoughts; development of comprehensive mental wellness plan. 05/03/23 Update: No changes at this time. Update 05/08/23: No changes at this time.   Update 05/13/2023: No changes at this time. Update 05/18/2023: No changes at this time. Update 05/23/23: No changes at this time. Update 05/28/23: No changes at this time.  Update 06/02/23:  No changes at this time.     Patient Goals:  "To stay out of trouble and move back home" 05/03/23 Update: No changes at this time. Update 05/08/23: No changes at this time.   Update 05/13/2023: No changes at this time. Update 05/18/2023: No changes at this time. Update 05/23/23: No changes at this time.  Update 05/28/23: No changes at this time.  Update 06/02/23:  No changes at this time.    Discharge Plan or Barriers: CSW will assist with appropriate discharge planning 05/03/23 Update: No changes at this time. Update 05/08/23: Pt's guardian reports that he cannot come back home due to restraining order. CSW will discuss with leadership next steps for safe discharge planning. Update 05/13/2023: No changes at this time. Update 05/18/2023: Pt awaiting care coordinator through [facility] to assist with placement. Update 05/23/23: No changes at this time.  Update 05/28/23: The patient does not have placement and does not want to go into a shelter. Waiting for a call back from the care coordinators and the patient dad. Update 06/02/23:  Placement continues to be a concern for the patient at this time.  Father maintains that the patient can not return home.  Patient has has one group home interview and CSW is assisting in identifying the patient's funding source to assist with any possible placement.   Reason for Continuation of Hospitalization: Aggression Medication stabilization   Estimated Length of Stay: 1 to 7 days 05/03/23 Update: No changes at this time. Update 05/08/23: TBD  Update 05/13/2023: TBD Update 05/18/2023: TBD. Update 05/23/23: No changes at this time. Update 05/28/23: TBD  Update 06/02/23:  TBD  Last 3 [place] Suicide Severity Risk Score: Flowsheet Row Admission (Current) from 04/27/2023 in [facility] ED from 12/31/2022 in [facility] ED from 12/29/2022 in [facility] Emergency Department at [facility]  C-SSRS RISK CATEGORY No Risk No Risk No Risk       Last  [facility] 2/9 Scores:     No data to display          Scribe for Treatment Team: Harden Mo, [place] 06/02/2023 10:25 AM

## 2023-06-03 DIAGNOSIS — F200 Paranoid schizophrenia: Secondary | ICD-10-CM | POA: Diagnosis not present

## 2023-06-03 MED ORDER — TUBERCULIN PPD 5 UNIT/0.1ML ID SOLN
5.0000 [IU] | Freq: Once | INTRADERMAL | Status: AC
  Administered 2023-06-03: 5 [IU] via INTRADERMAL
  Filled 2023-06-03: qty 0.1

## 2023-06-03 NOTE — Progress Notes (Signed)
Patient was cooperative with treatment and medication compliant on shift thus far.  He denies SI, HI & AVH. No new behavioral issues to report on shift a this time.

## 2023-06-03 NOTE — Plan of Care (Signed)
   06/03/23 0900  Psych Admission Type (Psych Patients Only)  Admission Status Involuntary  Psychosocial Assessment  Patient Complaints None  Eye Contact Fair  Facial Expression Animated  Affect Appropriate to circumstance  Speech Logical/coherent  Interaction Assertive  Motor Activity Tremors  Appearance/Hygiene Unremarkable  Behavior Characteristics Cooperative  Mood Pleasant  Thought Process  Content WDL  Delusions None reported or observed  Perception WDL  Hallucination None reported or observed  Judgment WDL  Confusion None  Danger to Self  Current suicidal ideation? Denies  Self-Injurious Behavior No self-injurious ideation or behavior indicators observed or expressed   Agreement Not to Harm Self Yes  Description of Agreement verbal  Danger to Others  Danger to Others None reported or observed   Problem: Education: Goal: Knowledge of [place] [facility] Education information/materials will improve Outcome: Progressing Goal: Emotional status will improve Outcome: Progressing Goal: Mental status will improve Outcome: Progressing Goal: Verbalization of understanding the information provided will improve Outcome: Progressing   Problem: Education: Goal: Emotional status will improve Outcome: Progressing Goal: Mental status will improve Outcome: Progressing   Problem: Coping: Goal: Ability to verbalize frustrations and anger appropriately will improve Outcome: Progressing Goal: Ability to demonstrate self-control will improve Outcome: Progressing   Problem: Health Behavior/Discharge Planning: Goal: Identification of resources available to assist in meeting health care needs will improve Outcome: Progressing Goal: Compliance with treatment plan for underlying cause of condition will improve Outcome: Progressing   Problem: Physical Regulation: Goal: Ability to maintain clinical measurements within normal limits will improve Outcome: Progressing   Problem:  Safety: Goal: Periods of time without injury will increase Outcome: Progressing   Problem: Education: Goal: Knowledge of General Education information will improve Description: Including pain rating scale, medication(s)/side effects and non-pharmacologic comfort measures Outcome: Progressing   Problem: Health Behavior/Discharge Planning: Goal: Ability to manage health-related needs will improve Outcome: Progressing   Problem: Clinical Measurements: Goal: Ability to maintain clinical measurements within normal limits will improve Outcome: Progressing Goal: Will remain free from infection Outcome: Progressing Goal: Diagnostic test results will improve Outcome: Progressing Goal: Respiratory complications will improve Outcome: Progressing Goal: Cardiovascular complication will be avoided Outcome: Progressing   Problem: Activity: Goal: Risk for activity intolerance will decrease Outcome: Progressing   Problem: Nutrition: Goal: Adequate nutrition will be maintained Outcome: Progressing   Problem: Coping: Goal: Level of anxiety will decrease Outcome: Progressing   Problem: Elimination: Goal: Will not experience complications related to bowel motility Outcome: Progressing Goal: Will not experience complications related to urinary retention Outcome: Progressing   Problem: Pain Management: Goal: General experience of comfort will improve Outcome: Progressing   Problem: Safety: Goal: Ability to remain free from injury will improve Outcome: Progressing   Problem: Skin Integrity: Goal: Risk for impaired skin integrity will decrease Outcome: Progressing

## 2023-06-03 NOTE — Group Note (Signed)
Date:  06/03/2023 Time:  8:31 PM  Group Topic/Focus:  Wrap-Up Group:   The focus of this group is to help patients review their daily goal of treatment and discuss progress on daily workbooks.    Participation Level:  Did Not Attend   Katina Dung 06/03/2023, 8:31 PM

## 2023-06-03 NOTE — Progress Notes (Signed)
This Clinical research associate placed PPD to patient's right forearm. Patient tolerated injection well, without any issues. Patient was informed to leave the area alone until it is read in two days. Patient verbalized understanding and went back to the dayroom.

## 2023-06-03 NOTE — Plan of Care (Signed)
  Problem: Education: Goal: Knowledge of [facility] Education information/materials will improve Outcome: Progressing Goal: Emotional status will improve Outcome: Progressing Goal: Mental status will improve Outcome: Progressing Goal: Verbalization of understanding the information provided will improve Outcome: Progressing   Problem: Education: Goal: Emotional status will improve Outcome: Progressing Goal: Mental status will improve Outcome: Progressing   Problem: Coping: Goal: Ability to verbalize frustrations and anger appropriately will improve Outcome: Progressing Goal: Ability to demonstrate self-control will improve Outcome: Progressing   Problem: Health Behavior/Discharge Planning: Goal: Identification of resources available to assist in meeting health care needs will improve Outcome: Progressing Goal: Compliance with treatment plan for underlying cause of condition will improve Outcome: Progressing   Problem: Physical Regulation: Goal: Ability to maintain clinical measurements within normal limits will improve Outcome: Progressing   Problem: Safety: Goal: Periods of time without injury will increase Outcome: Progressing   Problem: Education: Goal: Knowledge of General Education information will improve Description: Including pain rating scale, medication(s)/side effects and non-pharmacologic comfort measures Outcome: Progressing   Problem: Health Behavior/Discharge Planning: Goal: Ability to manage health-related needs will improve Outcome: Progressing   Problem: Clinical Measurements: Goal: Ability to maintain clinical measurements within normal limits will improve Outcome: Progressing Goal: Will remain free from infection Outcome: Progressing Goal: Diagnostic test results will improve Outcome: Progressing Goal: Respiratory complications will improve Outcome: Progressing Goal: Cardiovascular complication will be avoided Outcome: Progressing    Problem: Activity: Goal: Risk for activity intolerance will decrease Outcome: Progressing   Problem: Nutrition: Goal: Adequate nutrition will be maintained Outcome: Progressing   Problem: Coping: Goal: Level of anxiety will decrease Outcome: Progressing   Problem: Elimination: Goal: Will not experience complications related to bowel motility Outcome: Progressing Goal: Will not experience complications related to urinary retention Outcome: Progressing   Problem: Pain Management: Goal: General experience of comfort will improve Outcome: Progressing   Problem: Safety: Goal: Ability to remain free from injury will improve Outcome: Progressing   Problem: Skin Integrity: Goal: Risk for impaired skin integrity will decrease Outcome: Progressing

## 2023-06-03 NOTE — Progress Notes (Signed)
[facility] MD Progress Note  05/28/2023  Mark BOTTING  MRN:  [id number]  Subjective:  Mark [name] is a 63 year old African American male with a history of schizophrenia, HTN, and COPD who is here due to aggressive behavior toward his father. He has been living with father in [place]. Per father, there is an order of protection in place.  Chart reviewed, and patient seen during rounds. No acute changes overnight. Today patient reports" fine" mood.  Patient continues to question about his discharge.  Reportedly patient is getting anxious on the unit because he wants to leave.  Patient was informed that social work assisting with disposition as patient is unable to return to father's home and is not appropriate for shelter.  Patient was informed that he has been accepted to [facility] group home.  He would need TB skin test which will be placed today.  The group home is also waiting for the payment.  This was discussed with patient.  Today patient denies thoughts of harming himself or others.  He denies auditory visual hallucinations.  Reports his sleep and appetite has improved.  Patient was encouraged to attend group and work on coping strategies.   Principal Problem: Paranoid schizophrenia (HCC) Diagnosis: Principal Problem:   Paranoid schizophrenia (HCC)  Total Time spent with patient: 20 minutes  Past Psychiatric History: see h&p  Past Medical History:  Past Medical History:  Diagnosis Date   COPD (chronic obstructive pulmonary disease) (HCC)    Hypertension    Schizophrenia (HCC)     Past Surgical History:  Procedure Laterality Date   LOWER EXTREMITY ANGIOGRAPHY N/A 09/18/2017   Procedure: LOWER EXTREMITY ANGIOGRAPHY- Recheck lysis;  Surgeon: Maeola Harman, MD;  Location: [facility] INVASIVE CV LAB;  Service: Cardiovascular;  Laterality: N/A;   PERIPHERAL VASCULAR INTERVENTION Left 09/18/2017   Procedure: PERIPHERAL VASCULAR INTERVENTION;  Surgeon: Maeola Harman, MD;  Location: [facility] INVASIVE CV LAB;  Service: Cardiovascular;  Laterality: Left;   THROMBECTOMY FEMORAL ARTERY Left 09/17/2017   Procedure: POSSIBLE THROMBECTOMY;  Surgeon: Maeola Harman, MD;  Location: [facility] OR;  Service: Vascular;  Laterality: Left;   VENOGRAM Left 09/17/2017   Procedure: ULTRASOUND POPLITEAL ACCESS; CENTRAL VENOGRAM, IVVS, LYSIS CATHETER PLACEMENT;  Surgeon: Maeola Harman, MD;  Location: [facility] OR;  Service: Vascular;  Laterality: Left;   Family History: History reviewed. No pertinent family history. Family Psychiatric  History: see h&p Social History:  Social History   Substance and Sexual Activity  Alcohol Use No     Social History   Substance and Sexual Activity  Drug Use Never    Social History   Socioeconomic History   Marital status: Single    Spouse name: Not on file   Number of children: Not on file   Years of education: Not on file   Highest education level: Not on file  Occupational History   Not on file  Tobacco Use   Smoking status: Every Day    Current packs/day: 0.50    Types: Cigarettes   Smokeless tobacco: Never  Vaping Use   Vaping status: Never Used  Substance and Sexual Activity   Alcohol use: No   Drug use: Never   Sexual activity: Not Currently  Other Topics Concern   Not on file  Social History Narrative   Not on file   Social Drivers of Health   Financial Resource Strain: Not on file  Food Insecurity: No Food Insecurity (04/27/2023)   Hunger Vital Sign  Worried About Programme researcher, broadcasting/film/video in the Last Year: Never true    Ran Out of Food in the Last Year: Never true  Transportation Needs: No Transportation Needs (04/27/2023)   PRAPARE - Administrator, Civil Service (Medical): No    Lack of Transportation (Non-Medical): No  Physical Activity: Not on file  Stress: Not on file  Social Connections: Not on file   Additional Social History:   Patient is homeless                       Sleep: Good  Appetite:  Good  Current Medications: Current Facility-Administered Medications  Medication Dose Route Frequency Provider Last Rate Last Admin   acetaminophen (TYLENOL) tablet 650 mg  650 mg Oral Q6H PRN Motley-Mangrum, [name], PMHNP       alum & mag hydroxide-simeth (MAALOX/MYLANTA) 200-200-20 MG/5ML suspension 30 mL  30 mL Oral Q4H PRN Motley-Mangrum, [name], PMHNP   30 mL at 06/01/23 1354   amantadine (SYMMETREL) capsule 100 mg  100 mg Oral BID Lewanda Rife, MD   100 mg at 06/03/23 0900   amLODipine (NORVASC) tablet 5 mg  5 mg Oral Daily Lewanda Rife, MD   5 mg at 06/03/23 0851   aspirin EC tablet 81 mg  81 mg Oral Daily Lewanda Rife, MD   81 mg at 06/03/23 0851   haloperidol (HALDOL) tablet 5 mg  5 mg Oral TID PRN Motley-Mangrum, [name], PMHNP   5 mg at 05/09/23 1610   And   diphenhydrAMINE (BENADRYL) capsule 50 mg  50 mg Oral TID PRN Motley-Mangrum, [name], PMHNP   50 mg at 05/09/23 0834   haloperidol lactate (HALDOL) injection 10 mg  10 mg Intramuscular TID PRN Motley-Mangrum, [name], PMHNP   10 mg at 05/19/23 1242   And   diphenhydrAMINE (BENADRYL) injection 50 mg  50 mg Intramuscular TID PRN Motley-Mangrum, [name], PMHNP       And   LORazepam (ATIVAN) injection 2 mg  2 mg Intramuscular TID PRN Motley-Mangrum, [name], PMHNP       haloperidol lactate (HALDOL) injection 5 mg  5 mg Intramuscular TID PRN Motley-Mangrum, [name], PMHNP       And   diphenhydrAMINE (BENADRYL) injection 50 mg  50 mg Intramuscular TID PRN Motley-Mangrum, [name], PMHNP   50 mg at 05/19/23 1240   And   LORazepam (ATIVAN) injection 2 mg  2 mg Intramuscular TID PRN Motley-Mangrum, [name], PMHNP   2 mg at 05/19/23 1240   divalproex (DEPAKOTE ER) 24 hr tablet 500 mg  500 mg Oral BID Motley-Mangrum, [name], PMHNP   500 mg at 06/03/23 0851   gabapentin (NEURONTIN) capsule 400 mg  400 mg Oral BID Motley-Mangrum, [name], PMHNP   400 mg at 06/03/23 0851    hydrOXYzine (ATARAX) tablet 25 mg  25 mg Oral TID PRN Motley-Mangrum, [name], PMHNP   25 mg at 05/22/23 2059   LORazepam (ATIVAN) tablet 1 mg  1 mg Oral Q8H PRN Motley-Mangrum, [name], PMHNP   1 mg at 05/09/23 0833   magnesium hydroxide (MILK OF MAGNESIA) suspension 30 mL  30 mL Oral Daily PRN Motley-Mangrum, [name], PMHNP       OLANZapine (ZYPREXA) tablet 15 mg  15 mg Oral QHS Lewanda Rife, MD   15 mg at 06/02/23 2133   traZODone (DESYREL) tablet 100 mg  100 mg Oral QHS PRN Motley-Mangrum, [name],  PMHNP   100 mg at 06/02/23 2133   valbenazine ([name]) capsule 40 mg  40 mg Oral Daily [name], DO   40 mg at 06/03/23 8295    Lab Results:  No results found for this or any previous visit (from the past 48 hours).   Blood Alcohol level:  Lab Results  Component Value Date   ETH <10 04/25/2023   ETH <10 12/31/2022    Metabolic Disorder Labs: Lab Results  Component Value Date   HGBA1C 5.8 (H) 05/28/2023   MPG 119.76 05/28/2023   MPG 111.15 12/31/2022   No results found for: "PROLACTIN" Lab Results  Component Value Date   CHOL 133 12/31/2022   TRIG 39 12/31/2022   HDL 54 12/31/2022   CHOLHDL 2.5 12/31/2022   VLDL 8 12/31/2022   LDLCALC 71 12/31/2022   LDLCALC 69 12/01/2022    COWS:     Musculoskeletal: Strength & Muscle Tone: within normal limits Gait & Station: normal Patient leans: N/A  Psychiatric Specialty Exam:  Presentation  General Appearance:  Appropriate for Environment; Casual; Fairly Groomed  Eye Contact: Good  Speech: Clear and Coherent; Normal Rate  Speech Volume: Normal  Handedness: Right   Mood and Affect  Mood: "Fine"  Affect: Appropriate; Congruent   Thought Process  Thought Processes: Coherent  Descriptions of Associations:Intact  Orientation:Full (Time, Place and Person)  Thought Content:Logical  History of Schizophrenia/Schizoaffective disorder:Yes  Duration of Psychotic Symptoms:Greater than  six months  Hallucinations:Denies  Ideas of Reference:None  Suicidal Thoughts:Denies  Homicidal Thoughts:Denies   Sensorium  Memory: Immediate Good; Recent Good  Judgment: Fair  Insight: Fair   Executive Functions  Concentration: Good  Attention Span: Good   Language: Good   Psychomotor Activity  Psychomotor Activity: Normal    Assets  Assets: Communication Skills; Desire for Improvement; Physical Health; Resilience; Social Support   Sleep  Sleep: Fair     Physical Exam: Physical Exam Vitals and nursing note reviewed.  HENT:     Head: Normocephalic and atraumatic.     Nose: Nose normal.  Eyes:     Extraocular Movements: Extraocular movements intact.  Pulmonary:     Effort: Pulmonary effort is normal.  Musculoskeletal:        General: Normal range of motion.     Cervical back: Normal range of motion.  Skin:    General: Skin is warm and dry.  Neurological:     Mental Status: He is alert and oriented to person, place, and time. Mental status is at baseline.  Psychiatric:        Mood and Affect: Mood normal.        Behavior: Behavior normal.        Thought Content: Thought content normal.        Judgment: Judgment normal.    Review of Systems  All other systems reviewed and are negative.  Blood pressure (!) 123/104, pulse 67, temperature 97.9 F (36.6 C), resp. rate 18, height 5\' 11"  (1.803 m), weight 71.2 kg, SpO2 100%. Body mass index is 21.9 kg/m.   Treatment Plan Summary: Daily contact with patient to assess and evaluate symptoms and progress in treatment and Medication management  Continue olanzapine 15 mg at bedtime for schizophrenia Continue Depakote 500 mg po BID for mood Continue [name] 40 mg p.o. daily for TD Continue Norvasc 5 mg by mouth daily for HTN   Valproic acid level : 60, ALT/AST 10/16, QTC 438 05/28/23: HgbA1c 5.8  Pt has been  accepted to [facility] group home for next week pending the following:     TB Skin Test, ordered today   LOG for $1000 dollars  Lewanda Rife, MD

## 2023-06-04 DIAGNOSIS — F200 Paranoid schizophrenia: Secondary | ICD-10-CM | POA: Diagnosis not present

## 2023-06-04 NOTE — Progress Notes (Signed)
   06/04/23 1000  Psych Admission Type (Psych Patients Only)  Admission Status Involuntary  Psychosocial Assessment  Patient Complaints None  Eye Contact Fair  Facial Expression Animated  Affect Appropriate to circumstance  Speech Logical/coherent  Interaction Assertive  Motor Activity Tremors  Appearance/Hygiene Unremarkable  Behavior Characteristics Cooperative  Mood Pleasant  Thought Process  Coherency WDL  Content WDL  Delusions None reported or observed  Perception WDL  Hallucination None reported or observed  Judgment WDL  Confusion None  Danger to Self  Current suicidal ideation? Denies  Danger to Others  Danger to Others None reported or observed   Pt denies SI/HI/AVH.

## 2023-06-04 NOTE — Plan of Care (Signed)
  Problem: Education: Goal: Knowledge of [facility] Education information/materials will improve Outcome: Progressing Goal: Emotional status will improve Outcome: Progressing Goal: Mental status will improve Outcome: Progressing Goal: Verbalization of understanding the information provided will improve Outcome: Progressing   Problem: Education: Goal: Emotional status will improve Outcome: Progressing Goal: Mental status will improve Outcome: Progressing   Problem: Coping: Goal: Ability to verbalize frustrations and anger appropriately will improve Outcome: Progressing Goal: Ability to demonstrate self-control will improve Outcome: Progressing   Problem: Health Behavior/Discharge Planning: Goal: Identification of resources available to assist in meeting health care needs will improve Outcome: Progressing Goal: Compliance with treatment plan for underlying cause of condition will improve Outcome: Progressing   Problem: Physical Regulation: Goal: Ability to maintain clinical measurements within normal limits will improve Outcome: Progressing   Problem: Safety: Goal: Periods of time without injury will increase Outcome: Progressing   Problem: Education: Goal: Knowledge of General Education information will improve Description: Including pain rating scale, medication(s)/side effects and non-pharmacologic comfort measures Outcome: Progressing   Problem: Health Behavior/Discharge Planning: Goal: Ability to manage health-related needs will improve Outcome: Progressing   Problem: Clinical Measurements: Goal: Ability to maintain clinical measurements within normal limits will improve Outcome: Progressing Goal: Will remain free from infection Outcome: Progressing Goal: Diagnostic test results will improve Outcome: Progressing Goal: Respiratory complications will improve Outcome: Progressing Goal: Cardiovascular complication will be avoided Outcome: Progressing    Problem: Activity: Goal: Risk for activity intolerance will decrease Outcome: Progressing   Problem: Nutrition: Goal: Adequate nutrition will be maintained Outcome: Progressing   Problem: Coping: Goal: Level of anxiety will decrease Outcome: Progressing   Problem: Elimination: Goal: Will not experience complications related to bowel motility Outcome: Progressing Goal: Will not experience complications related to urinary retention Outcome: Progressing   Problem: Pain Management: Goal: General experience of comfort will improve Outcome: Progressing   Problem: Safety: Goal: Ability to remain free from injury will improve Outcome: Progressing   Problem: Skin Integrity: Goal: Risk for impaired skin integrity will decrease Outcome: Progressing

## 2023-06-04 NOTE — Group Note (Signed)
Date:  06/04/2023 Time:  6:39 PM  Group Topic/Focus:  Individual therapy ;  to increase understanding of one's thought and behavior patterns to help increase function and well-being. Art Therapy ; to help people explore emotions, develop self-awareness, cope with stress, boost self-esteem, and work on Pharmacist, community.   Participation Level:  Active  Participation Quality:  Appropriate  Affect:  Appropriate  Cognitive:  Alert and Appropriate  Insight: Appropriate  Engagement in Group:  Developing/Improving  Modes of Intervention:  Activity and Discussion  Additional Comments:    [name] 06/04/2023, 6:39 PM

## 2023-06-04 NOTE — Group Note (Signed)
Date:  06/04/2023 Time:  11:32 AM  Group Topic/Focus:  Goals Group:   The focus of this group is to help patients establish daily goals to achieve during treatment and discuss how the patient can incorporate goal setting into their daily lives to aide in recovery. Healthy Communication:   The focus of this group is to discuss communication, barriers to communication, as well as healthy ways to communicate with others.    Participation Level:  Did Not Attend   Lynelle Smoke [facility] 06/04/2023, 11:32 AM

## 2023-06-04 NOTE — Group Note (Signed)
Date:  06/04/2023 Time:  8:45 PM  Group Topic/Focus:  Wrap-Up Group:   The focus of this group is to help patients review their daily goal of treatment and discuss progress on daily workbooks.    Participation Level:  Minimal  Participation Quality:  Appropriate and Attentive  Affect:  Appropriate  Cognitive:  Alert  Insight: Appropriate  Engagement in Group:  Engaged and Improving  Modes of Intervention:  Discussion  Additional Comments:     Maglione,Seung Nidiffer E 06/04/2023, 8:45 PM

## 2023-06-04 NOTE — Progress Notes (Signed)
[facility] MD Progress Note  05/28/2023  Mark [name]  MRN:  [id number]  Subjective:  Mark [name] is a 63 year old African American male with a history of schizophrenia, HTN, and COPD who is here due to aggressive behavior toward his father. He has been living with father in [place]. Per father, there is an order of protection in place.  Chart reviewed, and patient seen during rounds. No acute changes overnight. Today patient reports" fine" mood.  Patient continues to question about his discharge.    Patient was informed that he has been accepted to [facility] group home.  TB skin test placed yesterday.  The group home is also waiting on the payment.  This was discussed with patient.  Today patient denies thoughts of harming himself or others.  He denies auditory visual hallucinations.  Reports his sleep and appetite has improved.  Patient was encouraged to attend group and work on coping strategies.   Principal Problem: Paranoid schizophrenia (HCC) Diagnosis: Principal Problem:   Paranoid schizophrenia (HCC)  Total Time spent with patient: 20 minutes  Past Psychiatric History: see h&p  Past Medical History:  Past Medical History:  Diagnosis Date   COPD (chronic obstructive pulmonary disease) (HCC)    Hypertension    Schizophrenia (HCC)     Past Surgical History:  Procedure Laterality Date   LOWER EXTREMITY ANGIOGRAPHY N/A 09/18/2017   Procedure: LOWER EXTREMITY ANGIOGRAPHY- Recheck lysis;  Surgeon: Maeola Harman, MD;  Location: [facility] INVASIVE CV LAB;  Service: Cardiovascular;  Laterality: N/A;   PERIPHERAL VASCULAR INTERVENTION Left 09/18/2017   Procedure: PERIPHERAL VASCULAR INTERVENTION;  Surgeon: Maeola Harman, MD;  Location: [facility] INVASIVE CV LAB;  Service: Cardiovascular;  Laterality: Left;   THROMBECTOMY FEMORAL ARTERY Left 09/17/2017   Procedure: POSSIBLE THROMBECTOMY;  Surgeon: Maeola Harman, MD;  Location: [facility] OR;  Service: Vascular;   Laterality: Left;   VENOGRAM Left 09/17/2017   Procedure: ULTRASOUND POPLITEAL ACCESS; CENTRAL VENOGRAM, IVVS, LYSIS CATHETER PLACEMENT;  Surgeon: Maeola Harman, MD;  Location: [facility] OR;  Service: Vascular;  Laterality: Left;   Family History: History reviewed. No pertinent family history. Family Psychiatric  History: see h&p Social History:  Social History   Substance and Sexual Activity  Alcohol Use No     Social History   Substance and Sexual Activity  Drug Use Never    Social History   Socioeconomic History   Marital status: Single    Spouse name: Not on file   Number of children: Not on file   Years of education: Not on file   Highest education level: Not on file  Occupational History   Not on file  Tobacco Use   Smoking status: Every Day    Current packs/day: 0.50    Types: Cigarettes   Smokeless tobacco: Never  Vaping Use   Vaping status: Never Used  Substance and Sexual Activity   Alcohol use: No   Drug use: Never   Sexual activity: Not Currently  Other Topics Concern   Not on file  Social History Narrative   Not on file   Social Drivers of Health   Financial Resource Strain: Not on file  Food Insecurity: No Food Insecurity (04/27/2023)   Hunger Vital Sign    Worried About Running Out of Food in the Last Year: Never true    Ran Out of Food in the Last Year: Never true  Transportation Needs: No Transportation Needs (04/27/2023)   PRAPARE - Transportation    Lack of Transportation (  Medical): No    Lack of Transportation (Non-Medical): No  Physical Activity: Not on file  Stress: Not on file  Social Connections: Not on file                      Sleep: Good  Appetite:  Good  Current Medications: Current Facility-Administered Medications  Medication Dose Route Frequency Provider Last Rate Last Admin   acetaminophen (TYLENOL) tablet 650 mg  650 mg Oral Q6H PRN Motley-Mangrum, [name], PMHNP       alum & mag hydroxide-simeth  (MAALOX/MYLANTA) 200-200-20 MG/5ML suspension 30 mL  30 mL Oral Q4H PRN Motley-Mangrum, [name], PMHNP   30 mL at 06/04/23 0950   amantadine (SYMMETREL) capsule 100 mg  100 mg Oral BID Lewanda Rife, MD   100 mg at 06/04/23 0947   amLODipine (NORVASC) tablet 5 mg  5 mg Oral Daily Lewanda Rife, MD   5 mg at 06/04/23 1610   aspirin EC tablet 81 mg  81 mg Oral Daily Lewanda Rife, MD   81 mg at 06/04/23 0948   haloperidol (HALDOL) tablet 5 mg  5 mg Oral TID PRN Motley-Mangrum, [name], PMHNP   5 mg at 05/09/23 9604   And   diphenhydrAMINE (BENADRYL) capsule 50 mg  50 mg Oral TID PRN Motley-Mangrum, [name], PMHNP   50 mg at 05/09/23 0834   haloperidol lactate (HALDOL) injection 10 mg  10 mg Intramuscular TID PRN Motley-Mangrum, [name], PMHNP   10 mg at 05/19/23 1242   And   diphenhydrAMINE (BENADRYL) injection 50 mg  50 mg Intramuscular TID PRN Motley-Mangrum, [name], PMHNP       And   LORazepam (ATIVAN) injection 2 mg  2 mg Intramuscular TID PRN Motley-Mangrum, [name], PMHNP       haloperidol lactate (HALDOL) injection 5 mg  5 mg Intramuscular TID PRN Motley-Mangrum, [name], PMHNP       And   diphenhydrAMINE (BENADRYL) injection 50 mg  50 mg Intramuscular TID PRN Motley-Mangrum, [name], PMHNP   50 mg at 05/19/23 1240   And   LORazepam (ATIVAN) injection 2 mg  2 mg Intramuscular TID PRN Motley-Mangrum, [name], PMHNP   2 mg at 05/19/23 1240   divalproex (DEPAKOTE ER) 24 hr tablet 500 mg  500 mg Oral BID Motley-Mangrum, [name], PMHNP   500 mg at 06/04/23 1720   gabapentin (NEURONTIN) capsule 400 mg  400 mg Oral BID Motley-Mangrum, [name], PMHNP   400 mg at 06/04/23 0948   hydrOXYzine (ATARAX) tablet 25 mg  25 mg Oral TID PRN Motley-Mangrum, [name], PMHNP   25 mg at 05/22/23 2059   LORazepam (ATIVAN) tablet 1 mg  1 mg Oral Q8H PRN Motley-Mangrum, [name], PMHNP   1 mg at 05/09/23 0833   magnesium hydroxide (MILK OF MAGNESIA) suspension 30 mL  30 mL Oral Daily PRN  Motley-Mangrum, [name], PMHNP       OLANZapine (ZYPREXA) tablet 15 mg  15 mg Oral QHS Lewanda Rife, MD   15 mg at 06/03/23 2105   traZODone (DESYREL) tablet 100 mg  100 mg Oral QHS PRN Motley-Mangrum, [name], PMHNP   100 mg at 06/03/23 2105   tuberculin injection 5 Units  5 Units Intradermal Once Lewanda Rife, MD   5 Units at 06/03/23 1545   valbenazine ([name]) capsule 40 mg  40 mg Oral Daily [name], DO   40 mg at 06/04/23 [phone]  Lab Results:  No results found for this or any previous visit (from the past 48 hours).   Blood Alcohol level:  Lab Results  Component Value Date   ETH <10 04/25/2023   ETH <10 12/31/2022    Metabolic Disorder Labs: Lab Results  Component Value Date   HGBA1C 5.8 (H) 05/28/2023   MPG 119.76 05/28/2023   MPG 111.15 12/31/2022   No results found for: "PROLACTIN" Lab Results  Component Value Date   CHOL 133 12/31/2022   TRIG 39 12/31/2022   HDL 54 12/31/2022   CHOLHDL 2.5 12/31/2022   VLDL 8 12/31/2022   LDLCALC 71 12/31/2022   LDLCALC 69 12/01/2022     Musculoskeletal: Strength & Muscle Tone: within normal limits Gait & Station: normal Patient leans: N/A  Psychiatric Specialty Exam:  Presentation  General Appearance:  Appropriate for Environment; Casual; Fairly Groomed  Eye Contact: Good  Speech: Clear and Coherent; Normal Rate  Speech Volume: Normal  Handedness: Right   Mood and Affect  Mood: "Fine"  Affect: Appropriate; Congruent   Thought Process  Thought Processes: Coherent  Descriptions of Associations:Intact  Orientation:Full (Time, Place and Person)  Thought Content:Logical  History of Schizophrenia/Schizoaffective disorder:Yes  Duration of Psychotic Symptoms:Greater than six months  Hallucinations:Denies  Ideas of Reference:None  Suicidal Thoughts:Denies  Homicidal Thoughts:Denies   Sensorium  Memory: Immediate Good; Recent  Good  Judgment: Fair  Insight: Fair   Executive Functions  Concentration: Good  Attention Span: Good   Language: Good   Psychomotor Activity  Psychomotor Activity: Normal    Assets  Assets: Communication Skills; Desire for Improvement; Physical Health; Resilience; Social Support   Sleep  Sleep: Fair     Physical Exam: Physical Exam Vitals and nursing note reviewed.  HENT:     Head: Normocephalic and atraumatic.     Nose: Nose normal.  Eyes:     Extraocular Movements: Extraocular movements intact.  Pulmonary:     Effort: Pulmonary effort is normal.  Musculoskeletal:        General: Normal range of motion.     Cervical back: Normal range of motion.  Skin:    General: Skin is warm and dry.  Neurological:     Mental Status: He is alert and oriented to person, place, and time. Mental status is at baseline.  Psychiatric:        Mood and Affect: Mood normal.        Behavior: Behavior normal.        Thought Content: Thought content normal.        Judgment: Judgment normal.    Review of Systems  All other systems reviewed and are negative.  Blood pressure 129/81, pulse 86, temperature 98 F (36.7 C), resp. rate 17, height 5\' 11"  (1.803 m), weight 71.2 kg, SpO2 100%. Body mass index is 21.9 kg/m.   Treatment Plan Summary: Daily contact with patient to assess and evaluate symptoms and progress in treatment and Medication management  Continue olanzapine 15 mg at bedtime for schizophrenia Continue Depakote 500 mg po BID for mood Continue [name] 40 mg p.o. daily for TD Continue Norvasc 5 mg by mouth daily for HTN   Valproic acid level : 60, ALT/AST 10/16, QTC 438 05/28/23: HgbA1c 5.8  Pt has been  accepted to [facility] group home for next week pending the following:    TB Skin Test,Placed yesterday, 06/03/23   LOG for $1000 dollars  Lewanda Rife, MD

## 2023-06-04 NOTE — BHH Counselor (Signed)
Mark [name] reports that the name of the facility is [facility].   Address is [address] [place], [place], [address].   Reynaldo Minium, MSW, [place] 06/04/2023 11:14 AM

## 2023-06-04 NOTE — BHH Counselor (Signed)
CSW contacted Achilles Dunk for information on name and address of group home for [facility], MSW, [facility] 06/04/2023 11:09 AM

## 2023-06-04 NOTE — Plan of Care (Signed)
Will continue to utilize coping skills.  Pt reported no problems

## 2023-06-05 NOTE — Group Note (Signed)
Recreation Therapy Group Note   Group Topic:Healthy Support Systems  Group Date: 06/05/2023 Start Time: 1000 End Time: 1040 Facilitators: Rosina Lowenstein, LRT, CTRS Location:  Craft Room  Group Description: Straw Bridge. In groups or individually, patients were given 10 plastic drinking straws and an equal length of masking tape. Using the materials provided, patients were instructed to build a free-standing bridge-like structure to suspend an everyday item (ex: deck of cards) off the floor or table surface. All materials were required to be used in Secondary school teacher. LRT facilitated post-activity discussion reviewing the importance of having strong and healthy support systems in our lives. LRT discussed how the people in our lives serve as the tape and the deck of cards we placed on top of our straw structure are the stressors we face in daily life. LRT and pts discussed what happens in our life when things get too heavy for [place], and we don't have strong supports outside of the hospital. Pt shared 2 of their healthy supports in their life aloud in the group.   Goal Area(s) Addressed:  Patient will identify 2 healthy supports in their life. Patient will identify skills to successfully complete activity. Patient will identify correlation of this activity to life post-discharge.  Patient will build on frustration tolerance skills. Patient will increase team building and communication skills.    Affect/Mood: N/A   Participation Level: Did not attend    Clinical Observations/Individualized Feedback: Patient did not attend group.   Plan: Continue to engage patient in RT group sessions 2-3x/week.   Rosina Lowenstein, LRT, CTRS 06/05/2023 11:31 AM

## 2023-06-05 NOTE — Progress Notes (Signed)
Pleasant, cooperative and compliant.  Noted tremors but stable.  Denied SI/HI, and AVH.

## 2023-06-05 NOTE — Progress Notes (Signed)
   06/05/23 1600  PPD Results  Does patient have an induration at the injection site? No  Induration(mm) 0 mm  Name of Physician Notified Melanie Crazier, NP

## 2023-06-05 NOTE — Plan of Care (Signed)
Will continue to monitor.

## 2023-06-05 NOTE — Plan of Care (Signed)
Problem: Education: Goal: Knowledge of [facility] Education information/materials will improve Outcome: Progressing Goal: Emotional status will improve Outcome: Progressing Goal: Mental status will improve Outcome: Progressing Goal: Verbalization of understanding the information provided will improve Outcome: Progressing   Problem: Education: Goal: Emotional status will improve Outcome: Progressing Goal: Mental status will improve Outcome: Progressing   Problem: Coping: Goal: Ability to verbalize frustrations and anger appropriately will improve Outcome: Progressing Goal: Ability to demonstrate self-control will improve Outcome: Progressing   Problem: Health Behavior/Discharge Planning: Goal: Identification of resources available to assist in meeting health care needs will improve Outcome: Progressing Goal: Compliance with treatment plan for underlying cause of condition will improve Outcome: Progressing   Problem: Physical Regulation: Goal: Ability to maintain clinical measurements within normal limits will improve Outcome: Progressing   Problem: Safety: Goal: Periods of time without injury will increase Outcome: Progressing   Problem: Education: Goal: Knowledge of General Education information will improve Description: Including pain rating scale, medication(s)/side effects and non-pharmacologic comfort measures Outcome: Progressing   Problem: Health Behavior/Discharge Planning: Goal: Ability to manage health-related needs will improve Outcome: Progressing   Problem: Clinical Measurements: Goal: Ability to maintain clinical measurements within normal limits will improve Outcome: Progressing Goal: Will remain free from infection Outcome: Progressing Goal: Diagnostic test results will improve Outcome: Progressing Goal: Respiratory complications will improve Outcome: Progressing Goal: Cardiovascular complication will be avoided Outcome: Progressing    Problem: Activity: Goal: Risk for activity intolerance will decrease Outcome: Progressing   Problem: Nutrition: Goal: Adequate nutrition will be maintained Outcome: Progressing   Problem: Coping: Goal: Level of anxiety will decrease Outcome: Progressing   Problem: Elimination: Goal: Will not experience complications related to bowel motility Outcome: Progressing Goal: Will not experience complications related to urinary retention Outcome: Progressing   Problem: Pain Management: Goal: General experience of comfort will improve Outcome: Progressing   Problem: Safety: Goal: Ability to remain free from injury will improve Outcome: Progressing   Problem: Skin Integrity: Goal: Risk for impaired skin integrity will decrease Outcome: Progressing

## 2023-06-05 NOTE — Group Note (Signed)
[facility] LCSW Group Therapy Note    Group Date: 06/05/2023 Start Time: 1300 End Time: 1345  Type of Therapy and Topic:  Group Therapy:  Overcoming Obstacles  Participation Level:  [facility] PARTICIPATION LEVEL: Did Not Attend   Description of Group:   In this group patients will be encouraged to explore what they see as obstacles to their own wellness and recovery. They will be guided to discuss their thoughts, feelings, and behaviors related to these obstacles. The group will process together ways to cope with barriers, with attention given to specific choices patients can make. Each patient will be challenged to identify changes they are motivated to make in order to overcome their obstacles. This group will be process-oriented, with patients participating in exploration of their own experiences as well as giving and receiving support and challenge from other group members.  Therapeutic Goals: 1. Patient will identify personal and current obstacles as they relate to admission. 2. Patient will identify barriers that currently interfere with their wellness or overcoming obstacles.  3. Patient will identify feelings, thought process and behaviors related to these barriers. 4. Patient will identify two changes they are willing to make to overcome these obstacles:    Summary of Patient Progress X   Therapeutic Modalities:   Cognitive Behavioral Therapy Solution Focused Therapy Motivational Interviewing Relapse Prevention Therapy   Glenis Smoker, LCSW

## 2023-06-05 NOTE — Progress Notes (Signed)
   06/05/23 1000  Psych Admission Type (Psych Patients Only)  Admission Status Involuntary  Psychosocial Assessment  Patient Complaints Other (Comment) (patient states that his sleep meds wore off too quickly.)  Eye Contact Fair  Facial Expression Animated  Affect Appropriate to circumstance  Speech Logical/coherent  Interaction Assertive  Motor Activity Tremors  Appearance/Hygiene Layered clothes  Behavior Characteristics Cooperative;Appropriate to situation  Mood Pleasant  Aggressive Behavior  Effect No apparent injury  Thought Process  Coherency WDL  Content WDL  Delusions None reported or observed  Perception WDL  Hallucination None reported or observed  Judgment WDL  Confusion None  Danger to Self  Current suicidal ideation? Denies  Self-Injurious Behavior No self-injurious ideation or behavior indicators observed or expressed   Agreement Not to Harm Self Yes  Description of Agreement Verbal  Danger to Others  Danger to Others None reported or observed

## 2023-06-05 NOTE — [facility] Counselor (Signed)
CSW M. Armstrong reached out to leadership to assess LOG needed for patient's admission to [facility] group home.   CSW spoke with leader and drafted LOG. Leader to touch base with CSW team to confirm LOG amount of $1,000.00.   CSW team to continue to assess. Safe discharge updates communicated with patient and team.     Reymundo Poll, MSW, [facility] 06/05/2023 10:24 AM

## 2023-06-05 NOTE — Group Note (Signed)
Recreation Therapy Group Note   Group Topic:General Recreation  Group Date: 06/05/2023 Start Time: 1500 End Time: 1600 Facilitators: Rosina Lowenstein, LRT, CTRS Location:  Courtyard  Group Description: [employer]. LRT and patients played games of basketball, drew with chalk, and played corn hole while outside in the courtyard while getting fresh air and sunlight. Music was being played in the background. LRT and peers conversed about different games they have played before, what they do in their free time and anything else that is on their minds. LRT encouraged pts to drink water after being outside, sweating and getting their heart rate up.  Goal Area(s) Addressed: Patient will build on frustration tolerance skills. Patients will partake in a competitive play game with peers. Patients will gain knowledge of new leisure interest/hobby.    Affect/Mood: Appropriate   Participation Level: Active and Engaged   Participation Quality: Independent   Behavior: Calm and Cooperative   Speech/Thought Process: Coherent   Insight: Good   Judgement: Good   Modes of Intervention: Activity   Patient Response to Interventions:  Attentive, Engaged, and Receptive   Education Outcome:  Acknowledges education   Clinical Observations/Individualized Feedback: Mark [name] was active in their participation of session activities and group discussion. Pt identified "I haven't played ball in 16 years but this was fun!". Pt came late to group and went in early, however, played basketball with LRT and peers while outside.    Plan: Continue to engage patient in RT group sessions 2-3x/week.   Rosina Lowenstein, LRT, CTRS 06/05/2023 5:24 PM

## 2023-06-05 NOTE — Group Note (Signed)
Date:  06/05/2023 Time:  9:17 PM  Group Topic/Focus:  Wrap-Up Group:   The focus of this group is to help patients review their daily goal of treatment and discuss progress on daily workbooks.    Participation Level:  Did Not Attend  Lenore Cordia 06/05/2023, 9:17 PM

## 2023-06-05 NOTE — Group Note (Signed)
Date:  06/05/2023 Time:  10:11 AM  Group Topic/Focus:  Developing a Wellness Toolbox:   The focus of this group is to help patients develop a "wellness toolbox" with skills and strategies to promote recovery upon discharge. Identifying Needs:   The focus of this group is to help patients identify their personal needs that have been historically problematic and identify healthy behaviors to address their needs.    Participation Level:  Did Not Attend   [name] 06/05/2023, 10:11 AM

## 2023-06-06 NOTE — Group Note (Signed)
Recreation Therapy Group Note   Group Topic:Health and Wellness  Group Date: 06/06/2023 Start Time: 1500 End Time: 1600 Facilitators: Rosina Lowenstein, LRT, CTRS Location: Courtyard  Group Description: [employer]. LRT and patients played games of basketball, drew with chalk, and played corn hole while outside in the courtyard while getting fresh air and sunlight. Music was being played in the background. LRT and peers conversed about different games they have played before, what they do in their free time and anything else that is on their minds. LRT encouraged pts to drink water after being outside, sweating and getting their heart rate up.  Goal Area(s) Addressed: Patient will build on frustration tolerance skills. Patients will partake in a competitive play game with peers. Patients will gain knowledge of new leisure interest/hobby.    Affect/Mood: Appropriate   Participation Level: Active   Participation Quality: Independent   Behavior: Appropriate   Speech/Thought Process: Coherent   Insight: Good   Judgement: Good   Modes of Intervention: Activity and Music   Patient Response to Interventions:  Attentive and Receptive   Education Outcome:  Acknowledges education   Clinical Observations/Individualized Feedback: Mark [name] was active in their participation of session activities and group discussion. Pt chose to listen to music with peers while outside. Pt interacted well with LRT and peers duration of session.    Plan: Continue to engage patient in RT group sessions 2-3x/week.   Rosina Lowenstein, LRT, CTRS 06/06/2023 5:31 PM

## 2023-06-06 NOTE — Progress Notes (Signed)
   06/06/23 0500  Psych Admission Type (Psych Patients Only)  Admission Status Involuntary  Psychosocial Assessment  Patient Complaints None  Eye Contact Fair  Facial Expression Animated  Affect Appropriate to circumstance  Speech Logical/coherent  Interaction Assertive  Motor Activity Tremors  Appearance/Hygiene Layered clothes  Behavior Characteristics Cooperative;Appropriate to situation  Mood Pleasant  Aggressive Behavior  Effect No apparent injury  Thought Process  Coherency WDL  Content WDL  Delusions None reported or observed  Perception WDL  Hallucination None reported or observed  Judgment WDL  Confusion None  Danger to Self  Current suicidal ideation? Denies  Agreement Not to Harm Self Yes  Description of Agreement verbal  Danger to Others  Danger to Others None reported or observed

## 2023-06-06 NOTE — Progress Notes (Signed)
[facility] MD Progress Note  06/05/2023 Mark [name]  MRN:  [id number]  Subjective:  Mark [name] is a 63 year old African American male with a history of schizophrenia, HTN, and COPD who is here due to aggressive behavior toward his father. He has been living with father in [place]. Per father, there is an order of protection in place.  Chart reviewed, and patient seen during rounds. No acute changes overnight. Today patient reports" fine" mood.  He denies thoughts of harming himself and others. Visible in the milieu today. Not observed responding to internal stimuli. He is adherent with medication regimen.There is progress being made with discharge planning. Patient has been accepted to [facility] group home. PPD test to be read today.   Principal Problem: Paranoid schizophrenia (HCC) Diagnosis: Principal Problem:   Paranoid schizophrenia (HCC)  Total Time spent with patient: 20 minutes  Past Psychiatric History: see h&p  Past Medical History:  Past Medical History:  Diagnosis Date   COPD (chronic obstructive pulmonary disease) (HCC)    Hypertension    Schizophrenia (HCC)     Past Surgical History:  Procedure Laterality Date   LOWER EXTREMITY ANGIOGRAPHY N/A 09/18/2017   Procedure: LOWER EXTREMITY ANGIOGRAPHY- Recheck lysis;  Surgeon: Maeola Harman, MD;  Location: [facility] INVASIVE CV LAB;  Service: Cardiovascular;  Laterality: N/A;   PERIPHERAL VASCULAR INTERVENTION Left 09/18/2017   Procedure: PERIPHERAL VASCULAR INTERVENTION;  Surgeon: Maeola Harman, MD;  Location: [facility] INVASIVE CV LAB;  Service: Cardiovascular;  Laterality: Left;   THROMBECTOMY FEMORAL ARTERY Left 09/17/2017   Procedure: POSSIBLE THROMBECTOMY;  Surgeon: Maeola Harman, MD;  Location: [facility] OR;  Service: Vascular;  Laterality: Left;   VENOGRAM Left 09/17/2017   Procedure: ULTRASOUND POPLITEAL ACCESS; CENTRAL VENOGRAM, IVVS, LYSIS CATHETER PLACEMENT;  Surgeon: Maeola Harman, MD;  Location: [facility] OR;  Service: Vascular;  Laterality: Left;   Family History: History reviewed. No pertinent family history. Family Psychiatric  History: see h&p Social History:  Social History   Substance and Sexual Activity  Alcohol Use No     Social History   Substance and Sexual Activity  Drug Use Never    Social History   Socioeconomic History   Marital status: Single    Spouse name: Not on file   Number of children: Not on file   Years of education: Not on file   Highest education level: Not on file  Occupational History   Not on file  Tobacco Use   Smoking status: Every Day    Current packs/day: 0.50    Types: Cigarettes   Smokeless tobacco: Never  Vaping Use   Vaping status: Never Used  Substance and Sexual Activity   Alcohol use: No   Drug use: Never   Sexual activity: Not Currently  Other Topics Concern   Not on file  Social History Narrative   Not on file   Social Drivers of Health   Financial Resource Strain: Not on file  Food Insecurity: No Food Insecurity (04/27/2023)   Hunger Vital Sign    Worried About Running Out of Food in the Last Year: Never true    Ran Out of Food in the Last Year: Never true  Transportation Needs: No Transportation Needs (04/27/2023)   PRAPARE - Administrator, Civil Service (Medical): No    Lack of Transportation (Non-Medical): No  Physical Activity: Not on file  Stress: Not on file  Social Connections: Not on file  Sleep: Good  Appetite:  Good  Current Medications: Current Facility-Administered Medications  Medication Dose Route Frequency Provider Last Rate Last Admin   acetaminophen (TYLENOL) tablet 650 mg  650 mg Oral Q6H PRN Motley-Mangrum, [name], PMHNP       alum & mag hydroxide-simeth (MAALOX/MYLANTA) 200-200-20 MG/5ML suspension 30 mL  30 mL Oral Q4H PRN Motley-Mangrum, [name], PMHNP   30 mL at 06/04/23 0950   amantadine (SYMMETREL) capsule 100 mg   100 mg Oral BID Lewanda Rife, MD   100 mg at 06/05/23 2113   amLODipine (NORVASC) tablet 5 mg  5 mg Oral Daily Lewanda Rife, MD   5 mg at 06/05/23 1610   aspirin EC tablet 81 mg  81 mg Oral Daily Lewanda Rife, MD   81 mg at 06/05/23 9604   haloperidol (HALDOL) tablet 5 mg  5 mg Oral TID PRN Motley-Mangrum, [name], PMHNP   5 mg at 05/09/23 5409   And   diphenhydrAMINE (BENADRYL) capsule 50 mg  50 mg Oral TID PRN Motley-Mangrum, [name], PMHNP   50 mg at 05/09/23 0834   haloperidol lactate (HALDOL) injection 10 mg  10 mg Intramuscular TID PRN Motley-Mangrum, [name], PMHNP   10 mg at 05/19/23 1242   And   diphenhydrAMINE (BENADRYL) injection 50 mg  50 mg Intramuscular TID PRN Motley-Mangrum, [name], PMHNP       And   LORazepam (ATIVAN) injection 2 mg  2 mg Intramuscular TID PRN Motley-Mangrum, [name], PMHNP       haloperidol lactate (HALDOL) injection 5 mg  5 mg Intramuscular TID PRN Motley-Mangrum, [name], PMHNP       And   diphenhydrAMINE (BENADRYL) injection 50 mg  50 mg Intramuscular TID PRN Motley-Mangrum, [name], PMHNP   50 mg at 05/19/23 1240   And   LORazepam (ATIVAN) injection 2 mg  2 mg Intramuscular TID PRN Motley-Mangrum, [name], PMHNP   2 mg at 05/19/23 1240   divalproex (DEPAKOTE ER) 24 hr tablet 500 mg  500 mg Oral BID Motley-Mangrum, [name], PMHNP   500 mg at 06/05/23 1746   gabapentin (NEURONTIN) capsule 400 mg  400 mg Oral BID Motley-Mangrum, [name], PMHNP   400 mg at 06/05/23 2112   hydrOXYzine (ATARAX) tablet 25 mg  25 mg Oral TID PRN Motley-Mangrum, [name], PMHNP   25 mg at 06/04/23 2103   LORazepam (ATIVAN) tablet 1 mg  1 mg Oral Q8H PRN Motley-Mangrum, [name], PMHNP   1 mg at 05/09/23 0833   magnesium hydroxide (MILK OF MAGNESIA) suspension 30 mL  30 mL Oral Daily PRN Motley-Mangrum, [name], PMHNP       OLANZapine (ZYPREXA) tablet 15 mg  15 mg Oral QHS Lewanda Rife, MD   15 mg at 06/05/23 2112   traZODone (DESYREL) tablet 100 mg   100 mg Oral QHS PRN Motley-Mangrum, [name], PMHNP   100 mg at 06/05/23 2112   valbenazine ([name]) capsule 40 mg  40 mg Oral Daily [name], DO   40 mg at 06/05/23 8119    Lab Results:  No results found for this or any previous visit (from the past 48 hours).   Blood Alcohol level:  Lab Results  Component Value Date   [facility] <10 04/25/2023   ETH <10 12/31/2022    Metabolic Disorder Labs: Lab Results  Component Value Date   HGBA1C 5.8 (H) 05/28/2023   MPG 119.76 05/28/2023   MPG 111.15 12/31/2022  No results found for: "PROLACTIN" Lab Results  Component Value Date   CHOL 133 12/31/2022   TRIG 39 12/31/2022   HDL 54 12/31/2022   CHOLHDL 2.5 12/31/2022   VLDL 8 12/31/2022   LDLCALC 71 12/31/2022   LDLCALC 69 12/01/2022     Musculoskeletal: Strength & Muscle Tone: within normal limits Gait & Station: normal Patient leans: N/A  Psychiatric Specialty Exam:  Presentation  General Appearance:  Appropriate for Environment; Casual; Fairly Groomed  Eye Contact: Good  Speech: Clear and Coherent; Normal Rate  Speech Volume: Normal  Handedness: Right   Mood and Affect  Mood: "Fine"  Affect: Appropriate; Congruent   Thought Process  Thought Processes: Coherent  Descriptions of Associations:Intact  Orientation:Full (Time, Place and Person)  Thought Content:Logical  History of Schizophrenia/Schizoaffective disorder:Yes  Duration of Psychotic Symptoms:Greater than six months  Hallucinations:Denies  Ideas of Reference:None  Suicidal Thoughts:Denies  Homicidal Thoughts:Denies   Sensorium  Memory: Immediate Good; Recent Good  Judgment: Fair  Insight: Fair   Executive Functions  Concentration: Good  Attention Span: Good   Language: Good   Psychomotor Activity  Psychomotor Activity: Normal    Assets  Assets: Communication Skills; Desire for Improvement; Physical Health; Resilience; Social  Support   Sleep  Sleep: Fair     Physical Exam: Physical Exam Vitals and nursing note reviewed.  HENT:     Head: Normocephalic and atraumatic.     Nose: Nose normal.  Eyes:     Extraocular Movements: Extraocular movements intact.  Pulmonary:     Effort: Pulmonary effort is normal.  Musculoskeletal:        General: Normal range of motion.     Cervical back: Normal range of motion.  Skin:    General: Skin is warm and dry.  Neurological:     Mental Status: He is alert and oriented to person, place, and time. Mental status is at baseline.  Psychiatric:        Mood and Affect: Mood normal.        Behavior: Behavior normal.        Thought Content: Thought content normal.        Judgment: Judgment normal.    Review of Systems  All other systems reviewed and are negative.  Blood pressure (!) 153/93, pulse 77, temperature 98.4 F (36.9 C), resp. rate 17, height 5\' 11"  (1.803 m), weight 71.2 kg, SpO2 99%. Body mass index is 21.9 kg/m.   Treatment Plan Summary: Daily contact with patient to assess and evaluate symptoms and progress in treatment and Medication management  Continue olanzapine 15 mg at bedtime for schizophrenia Continue Depakote 500 mg po BID for mood Continue [name] 40 mg p.o. daily for TD Continue Norvasc 5 mg by mouth daily for HTN   Valproic acid level : 60, ALT/AST 10/16, QTC 438 05/28/23: HgbA1c 5.8  Pt has been  accepted to [facility] group home for next week pending the following:    TB Skin Test,Placed 06/03/23   LOG for $1000 dollars  Dashanique Brownstein Robyne Peers, NP

## 2023-06-06 NOTE — Group Note (Signed)
Recreation Therapy Group Note   Group Topic:Stress Management  Group Date: 06/06/2023 Start Time: 1000 End Time: 1055 Facilitators: Rosina Lowenstein, LRT, CTRS Location:  Craft Room  Group Description: Taboo. LRT and patients played the game Taboo. The object of the game is to have peers guess the word up at the top of the card drawn that is in bold, while being sure to not use any of the descriptive words down below it on the same card. If the person attempting to explain the word in bold uses any of the descriptive words down below, they lose their turn, and no one receives that card or point. LRT and patient's took turns being the one to describe the words while the rest of the group tried to guess what they were describing.   Goal Area(s) Addressed: Patient will identify physical symptoms of stress. Patient will identify emotional symptoms of stress. Patient will identify coping skills for stress. Patient will build frustration tolerance skills.  Patient will increase communication.   Affect/Mood: N/A   Participation Level: Did not attend    Clinical Observations/Individualized Feedback: Patient did not attend group.   Plan: Continue to engage patient in RT group sessions 2-3x/week.   Rosina Lowenstein, LRT, CTRS 06/06/2023 11:34 AM

## 2023-06-06 NOTE — Plan of Care (Signed)
Problem: Education: Goal: Knowledge of [facility] Education information/materials will improve Outcome: Progressing Goal: Emotional status will improve Outcome: Progressing

## 2023-06-06 NOTE — Group Note (Signed)
LCSW Group Therapy Note   Group Date: 06/06/2023 Start Time: 1330 End Time: 1430   Type of Therapy and Topic:  Group Therapy: Challenging Core Beliefs  Participation Level:  Did Not Attend  Description of Group:  Patients were educated about core beliefs and asked to identify one harmful core belief that they have. Patients were asked to explore from where those beliefs originate. Patients were asked to discuss how those beliefs make them feel and the resulting behaviors of those beliefs. They were then be asked if those beliefs are true and, if so, what evidence they have to support them. Lastly, group members were challenged to replace those negative core beliefs with helpful beliefs.   Therapeutic Goals:   1. Patient will identify harmful core beliefs and explore the origins of such beliefs. 2. Patient will identify feelings and behaviors that result from those core beliefs. 3. Patient will discuss whether such beliefs are true. 4.  Patient will replace harmful core beliefs with helpful ones.  Summary of Patient Progress:   Patient declined to attend treatment team.  Therapeutic Modalities: Cognitive Behavioral Therapy; Solution-Focused Therapy   Harden Mo, LCSWA 06/06/2023  2:42 PM

## 2023-06-06 NOTE — Plan of Care (Signed)
  Problem: Education: Goal: Knowledge of [facility] Education information/materials will improve Outcome: Progressing Goal: Emotional status will improve Outcome: Progressing Goal: Mental status will improve Outcome: Progressing Goal: Verbalization of understanding the information provided will improve Outcome: Progressing   Problem: Education: Goal: Emotional status will improve Outcome: Progressing Goal: Mental status will improve Outcome: Progressing   Problem: Coping: Goal: Ability to verbalize frustrations and anger appropriately will improve Outcome: Progressing Goal: Ability to demonstrate self-control will improve Outcome: Progressing   Problem: Health Behavior/Discharge Planning: Goal: Identification of resources available to assist in meeting health care needs will improve Outcome: Progressing Goal: Compliance with treatment plan for underlying cause of condition will improve Outcome: Progressing   Problem: Physical Regulation: Goal: Ability to maintain clinical measurements within normal limits will improve Outcome: Progressing   Problem: Safety: Goal: Periods of time without injury will increase Outcome: Progressing   Problem: Education: Goal: Knowledge of General Education information will improve Description: Including pain rating scale, medication(s)/side effects and non-pharmacologic comfort measures Outcome: Progressing   Problem: Health Behavior/Discharge Planning: Goal: Ability to manage health-related needs will improve Outcome: Progressing   Problem: Clinical Measurements: Goal: Ability to maintain clinical measurements within normal limits will improve Outcome: Progressing Goal: Will remain free from infection Outcome: Progressing Goal: Diagnostic test results will improve Outcome: Progressing Goal: Respiratory complications will improve Outcome: Progressing Goal: Cardiovascular complication will be avoided Outcome: Progressing    Problem: Activity: Goal: Risk for activity intolerance will decrease Outcome: Progressing   Problem: Nutrition: Goal: Adequate nutrition will be maintained Outcome: Progressing   Problem: Coping: Goal: Level of anxiety will decrease Outcome: Progressing   Problem: Elimination: Goal: Will not experience complications related to bowel motility Outcome: Progressing Goal: Will not experience complications related to urinary retention Outcome: Progressing   Problem: Pain Management: Goal: General experience of comfort will improve Outcome: Progressing   Problem: Safety: Goal: Ability to remain free from injury will improve Outcome: Progressing   Problem: Skin Integrity: Goal: Risk for impaired skin integrity will decrease Outcome: Progressing

## 2023-06-06 NOTE — Progress Notes (Signed)
   06/06/23 0900  Psych Admission Type (Psych Patients Only)  Admission Status Involuntary  Psychosocial Assessment  Patient Complaints None  Eye Contact Fair;Watchful  Facial Expression Animated;Fixed smile  Affect Appropriate to circumstance  Speech Logical/coherent  Interaction Assertive  Motor Activity Tremors;Slow  Appearance/Hygiene In scrubs;Layered clothes  Behavior Characteristics Cooperative;Appropriate to situation  Mood Pleasant  Aggressive Behavior  Effect No apparent injury  Thought Process  Coherency WDL  Content WDL  Delusions None reported or observed  Perception WDL  Hallucination None reported or observed  Judgment WDL  Confusion None  Danger to Self  Current suicidal ideation? Denies  Self-Injurious Behavior No self-injurious ideation or behavior indicators observed or expressed   Agreement Not to Harm Self Yes  Description of Agreement Verbal  Danger to Others  Danger to Others None reported or observed

## 2023-06-06 NOTE — Group Note (Signed)
Date:  06/06/2023 Time:  11:05 PM  Group Topic/Focus:  Orientation:   The focus of this group is to educate the patient on the purpose and policies of crisis stabilization and provide a format to answer questions about their admission.  The group details unit policies and expectations of patients while admitted.    Participation Level:  Did Not Attend  Participation Quality:   none  Affect:   none  Cognitive:   none  Insight: None  Engagement in Group:   none  Modes of Intervention:   none  Additional Comments:  none   Kaj Vasil 06/06/2023, 11:05 PM

## 2023-06-06 NOTE — Group Note (Signed)
Date:  06/06/2023 Time:  11:11 AM  Group Topic/Focus:  Goals Group:   The focus of this group is to help patients establish daily goals to achieve during treatment and discuss how the patient can incorporate goal setting into their daily lives to aide in recovery.    Participation Level:  Did Not Attend   Lynelle Smoke [facility] 06/06/2023, 11:11 AM

## 2023-06-07 MED ORDER — HYDROXYZINE HCL 25 MG PO TABS
25.0000 mg | ORAL_TABLET | Freq: Three times a day (TID) | ORAL | 0 refills | Status: AC | PRN
  Filled 2023-06-07: qty 30, 10d supply, fill #0

## 2023-06-07 MED ORDER — GABAPENTIN 400 MG PO CAPS
400.0000 mg | ORAL_CAPSULE | Freq: Two times a day (BID) | ORAL | 0 refills | Status: AC
  Filled 2023-06-07: qty 60, 30d supply, fill #0

## 2023-06-07 MED ORDER — OLANZAPINE 5 MG PO TABS
15.0000 mg | ORAL_TABLET | Freq: Every day | ORAL | 0 refills | Status: AC
  Filled 2023-06-07: qty 90, 30d supply, fill #0

## 2023-06-07 MED ORDER — TRAZODONE HCL 100 MG PO TABS
100.0000 mg | ORAL_TABLET | Freq: Every evening | ORAL | 0 refills | Status: AC | PRN
  Filled 2023-06-07: qty 30, 30d supply, fill #0

## 2023-06-07 MED ORDER — AMANTADINE HCL 100 MG PO CAPS
100.0000 mg | ORAL_CAPSULE | Freq: Two times a day (BID) | ORAL | 0 refills | Status: AC
  Filled 2023-06-07: qty 60, 30d supply, fill #0

## 2023-06-07 MED ORDER — AMLODIPINE BESYLATE 5 MG PO TABS: 5.0000 mg | ORAL_TABLET | Freq: Every day | ORAL | 0 refills | Status: AC

## 2023-06-07 MED ORDER — VALBENAZINE TOSYLATE 40 MG PO CAPS
40.0000 mg | ORAL_CAPSULE | Freq: Every day | ORAL | 0 refills | Status: AC
  Filled 2023-06-07 – 2023-06-08 (×2): qty 30, 30d supply, fill #0

## 2023-06-07 MED ORDER — DIVALPROEX SODIUM ER 500 MG PO TB24
500.0000 mg | ORAL_TABLET | Freq: Two times a day (BID) | ORAL | 0 refills | Status: AC
  Filled 2023-06-07: qty 60, 30d supply, fill #0

## 2023-06-07 MED ORDER — ASPIRIN 81 MG PO TBEC
81.0000 mg | DELAYED_RELEASE_TABLET | Freq: Every day | ORAL | 12 refills | Status: AC
  Filled 2023-06-07: qty 30, 30d supply, fill #0

## 2023-06-07 NOTE — Group Note (Signed)
Date:  06/07/2023 Time:  5:01 PM  Group Topic/Focus:  Outdoor Recreation Therapy is a form of psychotherapy that utilizes outdoor activities to promote physical, social, and psychological well-being    Participation Level:  Active  Participation Quality:  Appropriate  Affect:  Appropriate  Cognitive:  Appropriate  Insight: Appropriate  Engagement in Group:  Developing/Improving  Modes of Intervention:  Activity  Additional Comments:    Shanquita Ronning 06/07/2023, 5:01 PM

## 2023-06-07 NOTE — Progress Notes (Signed)
   06/06/23 2056  Psych Admission Type (Psych Patients Only)  Admission Status Involuntary  Psychosocial Assessment  Patient Complaints Anxiety  Eye Contact Fair  Facial Expression Animated  Affect Appropriate to circumstance  Speech Logical/coherent  Interaction Assertive  Motor Activity Tremors  Appearance/Hygiene Layered clothes  Behavior Characteristics Cooperative;Appropriate to situation  Mood Pleasant  Aggressive Behavior  Effect No apparent injury  Thought Process  Coherency WDL  Content WDL  Delusions None reported or observed  Perception WDL  Hallucination None reported or observed  Judgment WDL  Confusion None  Danger to Self  Current suicidal ideation? Denies  Agreement Not to Harm Self Yes  Description of Agreement verbal  Danger to Others  Danger to Others None reported or observed

## 2023-06-07 NOTE — Group Note (Signed)
Date:  06/07/2023 Time:  10:37 PM  Group Topic/Focus:  Coping With Mental Health Crisis:   The purpose of this group is to help patients identify strategies for coping with mental health crisis.  Group discusses possible causes of crisis and ways to manage them effectively.    Participation Level:  Active  Participation Quality:  Appropriate and Attentive  Affect:  Appropriate  Cognitive:  Alert and Appropriate  Insight: Appropriate, Good, and Improving  Engagement in Group:  Developing/Improving and Engaged  Modes of Intervention:  Activity, Discussion, Problem-solving, Rapport Building, and Support  Additional Comments:     Mandalyn Pasqua 06/07/2023, 10:37 PM

## 2023-06-07 NOTE — Progress Notes (Signed)
[facility] MD Progress Note  06/07/2023 1918 Mark [name]  MRN:  [id number]  Subjective:  Mark [name] is a 63 year old African American male with a history of schizophrenia, HTN, and COPD who is here due to aggressive behavior toward his father. He has been living with father in [place]. Per father, there is an order of protection in place.  Chart reviewed, case discussed with multidisciplinary team, and patient seen during rounds. No acute changes since yesterday. He is visible in the milieu today, interactive with peers and staff. Verbalizes he is a little nervous about leaving tomorrow but ready to get out of the hospital. No  concerns at this time.  30-day supply of medications sent to [facility] community pharmacy. Plan to discharge tomorrow.  Principal Problem: Paranoid schizophrenia (HCC) Diagnosis: Principal Problem:   Paranoid schizophrenia (HCC)  Total Time spent with patient: 20 minutes  Past Psychiatric History: see h&p  Past Medical History:  Past Medical History:  Diagnosis Date   COPD (chronic obstructive pulmonary disease) (HCC)    Hypertension    Schizophrenia (HCC)     Past Surgical History:  Procedure Laterality Date   LOWER EXTREMITY ANGIOGRAPHY N/A 09/18/2017   Procedure: LOWER EXTREMITY ANGIOGRAPHY- Recheck lysis;  Surgeon: Maeola Harman, MD;  Location: [facility] INVASIVE CV LAB;  Service: Cardiovascular;  Laterality: N/A;   PERIPHERAL VASCULAR INTERVENTION Left 09/18/2017   Procedure: PERIPHERAL VASCULAR INTERVENTION;  Surgeon: Maeola Harman, MD;  Location: [facility] INVASIVE CV LAB;  Service: Cardiovascular;  Laterality: Left;   THROMBECTOMY FEMORAL ARTERY Left 09/17/2017   Procedure: POSSIBLE THROMBECTOMY;  Surgeon: Maeola Harman, MD;  Location: [facility] OR;  Service: Vascular;  Laterality: Left;   VENOGRAM Left 09/17/2017   Procedure: ULTRASOUND POPLITEAL ACCESS; CENTRAL VENOGRAM, IVVS, LYSIS CATHETER PLACEMENT;  Surgeon: Maeola Harman, MD;  Location: [facility] OR;  Service: Vascular;  Laterality: Left;   Family History: History reviewed. No pertinent family history. Family Psychiatric  History: see h&p Social History:  Social History   Substance and Sexual Activity  Alcohol Use No     Social History   Substance and Sexual Activity  Drug Use Never    Social History   Socioeconomic History   Marital status: Single    Spouse name: Not on file   Number of children: Not on file   Years of education: Not on file   Highest education level: Not on file  Occupational History   Not on file  Tobacco Use   Smoking status: Every Day    Current packs/day: 0.50    Types: Cigarettes   Smokeless tobacco: Never  Vaping Use   Vaping status: Never Used  Substance and Sexual Activity   Alcohol use: No   Drug use: Never   Sexual activity: Not Currently  Other Topics Concern   Not on file  Social History Narrative   Not on file   Social Drivers of Health   Financial Resource Strain: Not on file  Food Insecurity: No Food Insecurity (04/27/2023)   Hunger Vital Sign    Worried About Running Out of Food in the Last Year: Never true    Ran Out of Food in the Last Year: Never true  Transportation Needs: No Transportation Needs (04/27/2023)   PRAPARE - Administrator, Civil Service (Medical): No    Lack of Transportation (Non-Medical): No  Physical Activity: Not on file  Stress: Not on file  Social Connections: Not on file  Sleep: Good  Appetite:  Good  Current Medications: Current Facility-Administered Medications  Medication Dose Route Frequency Provider Last Rate Last Admin   acetaminophen (TYLENOL) tablet 650 mg  650 mg Oral Q6H PRN Motley-Mangrum, [name], PMHNP       alum & mag hydroxide-simeth (MAALOX/MYLANTA) 200-200-20 MG/5ML suspension 30 mL  30 mL Oral Q4H PRN Motley-Mangrum, [name], PMHNP   30 mL at 06/04/23 0950   amantadine (SYMMETREL) capsule 100 mg   100 mg Oral BID Lewanda Rife, MD   100 mg at 06/07/23 0928   amLODipine (NORVASC) tablet 5 mg  5 mg Oral Daily Lewanda Rife, MD   5 mg at 06/07/23 1610   aspirin EC tablet 81 mg  81 mg Oral Daily Lewanda Rife, MD   81 mg at 06/07/23 0929   haloperidol (HALDOL) tablet 5 mg  5 mg Oral TID PRN Motley-Mangrum, [name], PMHNP   5 mg at 05/09/23 9604   And   diphenhydrAMINE (BENADRYL) capsule 50 mg  50 mg Oral TID PRN Motley-Mangrum, [name], PMHNP   50 mg at 05/09/23 0834   haloperidol lactate (HALDOL) injection 10 mg  10 mg Intramuscular TID PRN Motley-Mangrum, [name], PMHNP   10 mg at 05/19/23 1242   And   diphenhydrAMINE (BENADRYL) injection 50 mg  50 mg Intramuscular TID PRN Motley-Mangrum, [name], PMHNP       And   LORazepam (ATIVAN) injection 2 mg  2 mg Intramuscular TID PRN Motley-Mangrum, [name], PMHNP       haloperidol lactate (HALDOL) injection 5 mg  5 mg Intramuscular TID PRN Motley-Mangrum, [name], PMHNP       And   diphenhydrAMINE (BENADRYL) injection 50 mg  50 mg Intramuscular TID PRN Motley-Mangrum, [name], PMHNP   50 mg at 05/19/23 1240   And   LORazepam (ATIVAN) injection 2 mg  2 mg Intramuscular TID PRN Motley-Mangrum, [name], PMHNP   2 mg at 05/19/23 1240   divalproex (DEPAKOTE ER) 24 hr tablet 500 mg  500 mg Oral BID Motley-Mangrum, [name], PMHNP   500 mg at 06/07/23 1703   gabapentin (NEURONTIN) capsule 400 mg  400 mg Oral BID Motley-Mangrum, [name], PMHNP   400 mg at 06/07/23 0928   hydrOXYzine (ATARAX) tablet 25 mg  25 mg Oral TID PRN Motley-Mangrum, [name], PMHNP   25 mg at 06/04/23 2103   LORazepam (ATIVAN) tablet 1 mg  1 mg Oral Q8H PRN Motley-Mangrum, [name], PMHNP   1 mg at 05/09/23 0833   magnesium hydroxide (MILK OF MAGNESIA) suspension 30 mL  30 mL Oral Daily PRN Motley-Mangrum, [name], PMHNP       OLANZapine (ZYPREXA) tablet 15 mg  15 mg Oral QHS Lewanda Rife, MD   15 mg at 06/06/23 2123   traZODone (DESYREL) tablet 100 mg   100 mg Oral QHS PRN Motley-Mangrum, [name], PMHNP   100 mg at 06/06/23 2123   valbenazine ([name]) capsule 40 mg  40 mg Oral Daily [name], DO   40 mg at 06/07/23 5409    Lab Results:  No results found for this or any previous visit (from the past 48 hours).   Blood Alcohol level:  Lab Results  Component Value Date   [facility] <10 04/25/2023   ETH <10 12/31/2022    Metabolic Disorder Labs: Lab Results  Component Value Date   HGBA1C 5.8 (H) 05/28/2023   MPG 119.76 05/28/2023   MPG 111.15 12/31/2022  No results found for: "PROLACTIN" Lab Results  Component Value Date   CHOL 133 12/31/2022   TRIG 39 12/31/2022   HDL 54 12/31/2022   CHOLHDL 2.5 12/31/2022   VLDL 8 12/31/2022   LDLCALC 71 12/31/2022   LDLCALC 69 12/01/2022     Musculoskeletal: Strength & Muscle Tone: within normal limits Gait & Station: normal Patient leans: N/A  Psychiatric Specialty Exam:  Presentation  General Appearance:  Appropriate for Environment; Casual; Fairly Groomed  Eye Contact: Good  Speech: Clear and Coherent; Normal Rate  Speech Volume: Normal  Handedness: Right   Mood and Affect  Mood: "Good"  Affect: Appropriate; Congruent   Thought Process  Thought Processes: Coherent  Descriptions of Associations:Intact  Orientation:Full (Time, Place and Person)  Thought Content:Logical  History of Schizophrenia/Schizoaffective disorder:Yes  Duration of Psychotic Symptoms:Greater than six months  Hallucinations:Denies  Ideas of Reference:None  Suicidal Thoughts:Denies  Homicidal Thoughts:Denies   Sensorium  Memory: Immediate Good; Recent Good  Judgment: Fair  Insight: Fair   Executive Functions  Concentration: Good  Attention Span: Good   Language: Good   Psychomotor Activity  Psychomotor Activity: Normal    Assets  Assets: Communication Skills; Desire for Improvement; Physical Health; Resilience; Social  Support   Sleep  Sleep: Fair     Physical Exam: Physical Exam Vitals and nursing note reviewed.  HENT:     Head: Normocephalic and atraumatic.     Nose: Nose normal.  Eyes:     Extraocular Movements: Extraocular movements intact.  Pulmonary:     Effort: Pulmonary effort is normal.  Musculoskeletal:        General: Normal range of motion.     Cervical back: Normal range of motion.  Skin:    General: Skin is warm and dry.  Neurological:     Mental Status: He is alert and oriented to person, place, and time. Mental status is at baseline.  Psychiatric:        Mood and Affect: Mood normal.        Behavior: Behavior normal.        Thought Content: Thought content normal.        Judgment: Judgment normal.    Review of Systems  All other systems reviewed and are negative.  Blood pressure (!) 150/131, pulse 80, temperature 98.3 F (36.8 C), temperature source Oral, resp. rate 17, height 5\' 11"  (1.803 m), weight 71.2 kg, SpO2 100%. Body mass index is 21.9 kg/m.   Treatment Plan Summary: Daily contact with patient to assess and evaluate symptoms and progress in treatment and Medication management  Continue olanzapine 15 mg at bedtime for schizophrenia Continue Depakote 500 mg po BID for mood Continue [name] 40 mg p.o. daily for TD Continue Norvasc 5 mg by mouth daily for HTN Continue ASA 81mg  PO daily   Valproic acid level : 60, ALT/AST 10/16, QTC 438 05/28/23: HgbA1c 5.8  Pt has been  accepted to [facility] group home; anticipate discharge 06/08/23  Quantavia Frith Robyne Peers, NP

## 2023-06-07 NOTE — Progress Notes (Signed)
[facility] MD Progress Note  06/06/2023 Mark [name]  MRN:  [id number]  Subjective:  Mark [name] is a 63 year old African American male with a history of schizophrenia, HTN, and COPD who is here due to aggressive behavior toward his father. He has been living with father in [place]. Per father, there is an order of protection in place.  Chart reviewed, case discussed with multidisciplinary team, and patient seen during rounds. No acute changes overnight. He is visible in the milieu today. Today patient reports" fine" mood.  He denies thoughts of harming self and others. He inquires about his discharge placement. He is adherent with medication regimen and denies side effects. He has no concerns to discuss.  Plan to discharge Thursday to group home.  Principal Problem: Paranoid schizophrenia (HCC) Diagnosis: Principal Problem:   Paranoid schizophrenia (HCC)  Total Time spent with patient: 20 minutes  Past Psychiatric History: see h&p  Past Medical History:  Past Medical History:  Diagnosis Date   COPD (chronic obstructive pulmonary disease) (HCC)    Hypertension    Schizophrenia (HCC)     Past Surgical History:  Procedure Laterality Date   LOWER EXTREMITY ANGIOGRAPHY N/A 09/18/2017   Procedure: LOWER EXTREMITY ANGIOGRAPHY- Recheck lysis;  Surgeon: Maeola Harman, MD;  Location: [facility] INVASIVE CV LAB;  Service: Cardiovascular;  Laterality: N/A;   PERIPHERAL VASCULAR INTERVENTION Left 09/18/2017   Procedure: PERIPHERAL VASCULAR INTERVENTION;  Surgeon: Maeola Harman, MD;  Location: [facility] INVASIVE CV LAB;  Service: Cardiovascular;  Laterality: Left;   THROMBECTOMY FEMORAL ARTERY Left 09/17/2017   Procedure: POSSIBLE THROMBECTOMY;  Surgeon: Maeola Harman, MD;  Location: [facility] OR;  Service: Vascular;  Laterality: Left;   VENOGRAM Left 09/17/2017   Procedure: ULTRASOUND POPLITEAL ACCESS; CENTRAL VENOGRAM, IVVS, LYSIS CATHETER PLACEMENT;  Surgeon: Maeola Harman, MD;  Location: [facility] OR;  Service: Vascular;  Laterality: Left;   Family History: History reviewed. No pertinent family history. Family Psychiatric  History: see h&p Social History:  Social History   Substance and Sexual Activity  Alcohol Use No     Social History   Substance and Sexual Activity  Drug Use Never    Social History   Socioeconomic History   Marital status: Single    Spouse name: Not on file   Number of children: Not on file   Years of education: Not on file   Highest education level: Not on file  Occupational History   Not on file  Tobacco Use   Smoking status: Every Day    Current packs/day: 0.50    Types: Cigarettes   Smokeless tobacco: Never  Vaping Use   Vaping status: Never Used  Substance and Sexual Activity   Alcohol use: No   Drug use: Never   Sexual activity: Not Currently  Other Topics Concern   Not on file  Social History Narrative   Not on file   Social Drivers of Health   Financial Resource Strain: Not on file  Food Insecurity: No Food Insecurity (04/27/2023)   Hunger Vital Sign    Worried About Running Out of Food in the Last Year: Never true    Ran Out of Food in the Last Year: Never true  Transportation Needs: No Transportation Needs (04/27/2023)   PRAPARE - Administrator, Civil Service (Medical): No    Lack of Transportation (Non-Medical): No  Physical Activity: Not on file  Stress: Not on file  Social Connections: Not on file  Sleep: Good  Appetite:  Good  Current Medications: Current Facility-Administered Medications  Medication Dose Route Frequency Provider Last Rate Last Admin   acetaminophen (TYLENOL) tablet 650 mg  650 mg Oral Q6H PRN Motley-Mangrum, [name], PMHNP       alum & mag hydroxide-simeth (MAALOX/MYLANTA) 200-200-20 MG/5ML suspension 30 mL  30 mL Oral Q4H PRN Motley-Mangrum, [name], PMHNP   30 mL at 06/04/23 0950   amantadine (SYMMETREL) capsule 100 mg   100 mg Oral BID Lewanda Rife, MD   100 mg at 06/07/23 0928   amLODipine (NORVASC) tablet 5 mg  5 mg Oral Daily Lewanda Rife, MD   5 mg at 06/07/23 1610   aspirin EC tablet 81 mg  81 mg Oral Daily Lewanda Rife, MD   81 mg at 06/07/23 0929   haloperidol (HALDOL) tablet 5 mg  5 mg Oral TID PRN Motley-Mangrum, [name], PMHNP   5 mg at 05/09/23 9604   And   diphenhydrAMINE (BENADRYL) capsule 50 mg  50 mg Oral TID PRN Motley-Mangrum, [name], PMHNP   50 mg at 05/09/23 0834   haloperidol lactate (HALDOL) injection 10 mg  10 mg Intramuscular TID PRN Motley-Mangrum, [name], PMHNP   10 mg at 05/19/23 1242   And   diphenhydrAMINE (BENADRYL) injection 50 mg  50 mg Intramuscular TID PRN Motley-Mangrum, [name], PMHNP       And   LORazepam (ATIVAN) injection 2 mg  2 mg Intramuscular TID PRN Motley-Mangrum, [name], PMHNP       haloperidol lactate (HALDOL) injection 5 mg  5 mg Intramuscular TID PRN Motley-Mangrum, [name], PMHNP       And   diphenhydrAMINE (BENADRYL) injection 50 mg  50 mg Intramuscular TID PRN Motley-Mangrum, [name], PMHNP   50 mg at 05/19/23 1240   And   LORazepam (ATIVAN) injection 2 mg  2 mg Intramuscular TID PRN Motley-Mangrum, [name], PMHNP   2 mg at 05/19/23 1240   divalproex (DEPAKOTE ER) 24 hr tablet 500 mg  500 mg Oral BID Motley-Mangrum, [name], PMHNP   500 mg at 06/07/23 1703   gabapentin (NEURONTIN) capsule 400 mg  400 mg Oral BID Motley-Mangrum, [name], PMHNP   400 mg at 06/07/23 0928   hydrOXYzine (ATARAX) tablet 25 mg  25 mg Oral TID PRN Motley-Mangrum, [name], PMHNP   25 mg at 06/04/23 2103   LORazepam (ATIVAN) tablet 1 mg  1 mg Oral Q8H PRN Motley-Mangrum, [name], PMHNP   1 mg at 05/09/23 0833   magnesium hydroxide (MILK OF MAGNESIA) suspension 30 mL  30 mL Oral Daily PRN Motley-Mangrum, [name], PMHNP       OLANZapine (ZYPREXA) tablet 15 mg  15 mg Oral QHS Lewanda Rife, MD   15 mg at 06/06/23 2123   traZODone (DESYREL) tablet 100 mg   100 mg Oral QHS PRN Motley-Mangrum, [name], PMHNP   100 mg at 06/06/23 2123   valbenazine ([name]) capsule 40 mg  40 mg Oral Daily [name], DO   40 mg at 06/07/23 5409    Lab Results:  No results found for this or any previous visit (from the past 48 hours).   Blood Alcohol level:  Lab Results  Component Value Date   [facility] <10 04/25/2023   ETH <10 12/31/2022    Metabolic Disorder Labs: Lab Results  Component Value Date   HGBA1C 5.8 (H) 05/28/2023   MPG 119.76 05/28/2023   MPG 111.15 12/31/2022  No results found for: "PROLACTIN" Lab Results  Component Value Date   CHOL 133 12/31/2022   TRIG 39 12/31/2022   HDL 54 12/31/2022   CHOLHDL 2.5 12/31/2022   VLDL 8 12/31/2022   LDLCALC 71 12/31/2022   LDLCALC 69 12/01/2022     Musculoskeletal: Strength & Muscle Tone: within normal limits Gait & Station: normal Patient leans: N/A  Psychiatric Specialty Exam:  Presentation  General Appearance:  Appropriate for Environment; Casual; Fairly Groomed  Eye Contact: Good  Speech: Clear and Coherent; Normal Rate  Speech Volume: Normal  Handedness: Right   Mood and Affect  Mood: "Fine"  Affect: Appropriate; Congruent   Thought Process  Thought Processes: Coherent  Descriptions of Associations:Intact  Orientation:Full (Time, Place and Person)  Thought Content:Logical  History of Schizophrenia/Schizoaffective disorder:Yes  Duration of Psychotic Symptoms:Greater than six months  Hallucinations:Denies  Ideas of Reference:None  Suicidal Thoughts:Denies  Homicidal Thoughts:Denies   Sensorium  Memory: Immediate Good; Recent Good  Judgment: Fair  Insight: Fair   Executive Functions  Concentration: Good  Attention Span: Good   Language: Good   Psychomotor Activity  Psychomotor Activity: Normal    Assets  Assets: Communication Skills; Desire for Improvement; Physical Health; Resilience; Social  Support   Sleep  Sleep: Fair     Physical Exam: Physical Exam Vitals and nursing note reviewed.  HENT:     Head: Normocephalic and atraumatic.     Nose: Nose normal.  Eyes:     Extraocular Movements: Extraocular movements intact.  Pulmonary:     Effort: Pulmonary effort is normal.  Musculoskeletal:        General: Normal range of motion.     Cervical back: Normal range of motion.  Skin:    General: Skin is warm and dry.  Neurological:     Mental Status: He is alert and oriented to person, place, and time. Mental status is at baseline.  Psychiatric:        Mood and Affect: Mood normal.        Behavior: Behavior normal.        Thought Content: Thought content normal.        Judgment: Judgment normal.    Review of Systems  All other systems reviewed and are negative.  Blood pressure (!) 150/131, pulse 80, temperature 98.3 F (36.8 C), temperature source Oral, resp. rate 17, height 5\' 11"  (1.803 m), weight 71.2 kg, SpO2 100%. Body mass index is 21.9 kg/m.   Treatment Plan Summary: Daily contact with patient to assess and evaluate symptoms and progress in treatment and Medication management  Continue olanzapine 15 mg at bedtime for schizophrenia Continue Depakote 500 mg po BID for mood Continue [name] 40 mg p.o. daily for TD Continue Norvasc 5 mg by mouth daily for HTN   Valproic acid level : 60, ALT/AST 10/16, QTC 438 05/28/23: HgbA1c 5.8  Pt has been  accepted to [facility] group home; anticipate discharge Thursday  Rockie Schnoor Robyne Peers, NP

## 2023-06-07 NOTE — Group Note (Signed)
Date:  06/07/2023 Time:  4:53 PM  Group Topic/Focus:  ART THERAPY focus  helps provide a way to express emotions and experiences not easily expressed in words. It is not about the final product; it is about healing through the process of making art.    Participation Level:  Active  Participation Quality:  Appropriate  Affect:  Appropriate  Cognitive:  Appropriate  Insight: Appropriate  Engagement in Group:  Developing/Improving  Modes of Intervention:  Activity and Discussion  Additional Comments:    Katlyn Muldrew 06/07/2023, 4:53 PM

## 2023-06-07 NOTE — Plan of Care (Signed)
Problem: Education: Goal: Knowledge of [facility] Education information/materials will improve Outcome: Progressing Goal: Emotional status will improve Outcome: Progressing

## 2023-06-07 NOTE — BH IP Treatment Plan (Signed)
Interdisciplinary Treatment and Diagnostic Plan Update  06/07/2023 Time of Session: 8:30AM Mark [name] MRN: [id number]  Principal Diagnosis: Paranoid schizophrenia [facility])  Secondary Diagnoses: Principal Problem:   Paranoid schizophrenia (HCC)   Current Medications:  Current Facility-Administered Medications  Medication Dose Route Frequency Provider Last Rate Last Admin   acetaminophen (TYLENOL) tablet 650 mg  650 mg Oral Q6H PRN Motley-Mangrum, [name], PMHNP       alum & mag hydroxide-simeth (MAALOX/MYLANTA) 200-200-20 MG/5ML suspension 30 mL  30 mL Oral Q4H PRN Motley-Mangrum, [name], PMHNP   30 mL at 06/04/23 0950   amantadine (SYMMETREL) capsule 100 mg  100 mg Oral BID Lewanda Rife, MD   100 mg at 06/07/23 0928   amLODipine (NORVASC) tablet 5 mg  5 mg Oral Daily Lewanda Rife, MD   5 mg at 06/07/23 4098   aspirin EC tablet 81 mg  81 mg Oral Daily Lewanda Rife, MD   81 mg at 06/07/23 0929   haloperidol (HALDOL) tablet 5 mg  5 mg Oral TID PRN Motley-Mangrum, [name], PMHNP   5 mg at 05/09/23 1191   And   diphenhydrAMINE (BENADRYL) capsule 50 mg  50 mg Oral TID PRN Motley-Mangrum, [name], PMHNP   50 mg at 05/09/23 0834   haloperidol lactate (HALDOL) injection 10 mg  10 mg Intramuscular TID PRN Motley-Mangrum, [name], PMHNP   10 mg at 05/19/23 1242   And   diphenhydrAMINE (BENADRYL) injection 50 mg  50 mg Intramuscular TID PRN Motley-Mangrum, [name], PMHNP       And   LORazepam (ATIVAN) injection 2 mg  2 mg Intramuscular TID PRN Motley-Mangrum, [name], PMHNP       haloperidol lactate (HALDOL) injection 5 mg  5 mg Intramuscular TID PRN Motley-Mangrum, [name], PMHNP       And   diphenhydrAMINE (BENADRYL) injection 50 mg  50 mg Intramuscular TID PRN Motley-Mangrum, [name], PMHNP   50 mg at 05/19/23 1240   And   LORazepam (ATIVAN) injection 2 mg  2 mg Intramuscular TID PRN Motley-Mangrum, [name], PMHNP   2 mg at 05/19/23 1240   divalproex (DEPAKOTE ER)  24 hr tablet 500 mg  500 mg Oral BID Motley-Mangrum, [name], PMHNP   500 mg at 06/07/23 0929   gabapentin (NEURONTIN) capsule 400 mg  400 mg Oral BID Motley-Mangrum, [name], PMHNP   400 mg at 06/07/23 0928   hydrOXYzine (ATARAX) tablet 25 mg  25 mg Oral TID PRN Motley-Mangrum, [name], PMHNP   25 mg at 06/04/23 2103   LORazepam (ATIVAN) tablet 1 mg  1 mg Oral Q8H PRN Motley-Mangrum, [name], PMHNP   1 mg at 05/09/23 0833   magnesium hydroxide (MILK OF MAGNESIA) suspension 30 mL  30 mL Oral Daily PRN Motley-Mangrum, [name], PMHNP       OLANZapine (ZYPREXA) tablet 15 mg  15 mg Oral QHS Lewanda Rife, MD   15 mg at 06/06/23 2123   traZODone (DESYREL) tablet 100 mg  100 mg Oral QHS PRN Motley-Mangrum, [name], PMHNP   100 mg at 06/06/23 2123   valbenazine ([name]) capsule 40 mg  40 mg Oral Daily [name], DO   40 mg at 06/07/23 4782   PTA Medications: Medications Prior to Admission  Medication Sig Dispense Refill Last Dose/Taking   amantadine (SYMMETREL) 100 MG capsule Take 100 mg by mouth 2 (two) times daily.      amLODipine (NORVASC) 5 MG tablet Take  1 tablet (5 mg total) by mouth daily.      aspirin EC 81 MG tablet Take 81 mg by mouth daily. Swallow whole.      benztropine (COGENTIN) 1 MG tablet Take 1 tablet (1 mg total) by mouth 2 (two) times daily. 60 tablet 0    cyclobenzaprine (FLEXERIL) 10 MG tablet Take 10 mg by mouth 2 (two) times daily as needed.      divalproex (DEPAKOTE ER) 500 MG 24 hr tablet Take 1 tablet (500 mg total) by mouth 2 (two) times daily. (Patient not taking: Reported on 04/25/2023) 60 tablet 0    gabapentin (NEURONTIN) 300 MG capsule Take 300 mg by mouth 3 (three) times daily. (Patient not taking: Reported on 04/25/2023)      gabapentin (NEURONTIN) 400 MG capsule Take 1 capsule (400 mg total) by mouth 3 (three) times daily. (Patient not taking: Reported on 04/25/2023) 90 capsule 0    haloperidol (HALDOL) 10 MG tablet Take 1 tablet (10 mg  total) by mouth 2 (two) times daily. (Patient not taking: Reported on 04/25/2023)      traZODone (DESYREL) 150 MG tablet Take 1 tablet (150 mg total) by mouth at bedtime. (Patient not taking: Reported on 04/25/2023) 30 tablet 0     Patient Stressors: Marital or family conflict   Medication change or noncompliance    Patient Strengths: Supportive family/friends   Treatment Modalities: Medication Management, Group therapy, Case management,  1 to 1 session with clinician, Psychoeducation, Recreational therapy.   Physician Treatment Plan for Primary Diagnosis: Paranoid schizophrenia (HCC) Long Term Goal(s): Improvement in symptoms so as ready for discharge   Short Term Goals: Ability to identify changes in lifestyle to reduce recurrence of condition will improve Ability to verbalize feelings will improve Ability to disclose and discuss suicidal ideas Ability to demonstrate self-control will improve Ability to identify and develop effective coping behaviors will improve Ability to maintain clinical measurements within normal limits will improve Compliance with prescribed medications will improve Ability to identify triggers associated with substance abuse/mental health issues will improve  Medication Management: Evaluate patient's response, side effects, and tolerance of medication regimen.  Therapeutic Interventions: 1 to 1 sessions, Unit Group sessions and Medication administration.  Evaluation of Outcomes: Progressing  Physician Treatment Plan for Secondary Diagnosis: Principal Problem:   Paranoid schizophrenia (HCC)  Long Term Goal(s): Improvement in symptoms so as ready for discharge   Short Term Goals: Ability to identify changes in lifestyle to reduce recurrence of condition will improve Ability to verbalize feelings will improve Ability to disclose and discuss suicidal ideas Ability to demonstrate self-control will improve Ability to identify and develop effective coping  behaviors will improve Ability to maintain clinical measurements within normal limits will improve Compliance with prescribed medications will improve Ability to identify triggers associated with substance abuse/mental health issues will improve     Medication Management: Evaluate patient's response, side effects, and tolerance of medication regimen.  Therapeutic Interventions: 1 to 1 sessions, Unit Group sessions and Medication administration.  Evaluation of Outcomes: Progressing   RN Treatment Plan for Primary Diagnosis: Paranoid schizophrenia (HCC) Long Term Goal(s): Knowledge of disease and therapeutic regimen to maintain health will improve  Short Term Goals: Ability to demonstrate self-control, Ability to participate in decision making will improve, Ability to verbalize feelings will improve, Ability to disclose and discuss suicidal ideas, Ability to identify and develop effective coping behaviors will improve, and Compliance with prescribed medications will improve  Medication Management: RN will administer medications as  ordered by provider, will assess and evaluate patient's response and provide education to patient for prescribed medication. RN will report any adverse and/or side effects to prescribing provider.  Therapeutic Interventions: 1 on 1 counseling sessions, Psychoeducation, Medication administration, Evaluate responses to treatment, Monitor vital signs and CBGs as ordered, Perform/monitor CIWA, COWS, AIMS and Fall Risk screenings as ordered, Perform wound care treatments as ordered.  Evaluation of Outcomes: Progressing   LCSW Treatment Plan for Primary Diagnosis: Paranoid schizophrenia (HCC) Long Term Goal(s): Safe transition to appropriate next level of care at discharge, Engage patient in therapeutic group addressing interpersonal concerns.  Short Term Goals: Engage patient in aftercare planning with referrals and resources, Increase social support, Increase ability  to appropriately verbalize feelings, Increase emotional regulation, Facilitate acceptance of mental health diagnosis and concerns, and Increase skills for wellness and recovery  Therapeutic Interventions: Assess for all discharge needs, 1 to 1 time with Social worker, Explore available resources and support systems, Assess for adequacy in community support network, Educate family and significant other(s) on suicide prevention, Complete Psychosocial Assessment, Interpersonal group therapy.  Evaluation of Outcomes: Progressing   Progress in Treatment: Attending groups: Yes. Participating in groups: Yes. Taking medication as prescribed: Yes. Toleration medication: Yes. Family/Significant other contact made: Yes, individual(s) contacted:  SPE completed with the patient's father Patient understands diagnosis: No. Discussing patient identified problems/goals with staff: Yes. Medical problems stabilized or resolved: Yes. Denies suicidal/homicidal ideation: Yes. Issues/concerns per patient self-inventory: No. Other: none  New problem(s) identified: No, Describe:  None identified Update 05/03/23: No changes at this time. Update 05/08/23: No changes at this time. Update 05/13/2023: No changes at this time. Update 05/18/2023: No changes at this time. Update 05/23/23: No changes at this time. Update 05/28/23: No changes at this time.  Update 06/02/23:  No changes at this time. Update 06/07/2023:  No changes at this time.    New Short Term/Long Term Goal(s):  elimination of symptoms of psychosis, medication management for mood stabilization; elimination of SI thoughts; development of comprehensive mental wellness plan. 05/03/23 Update: No changes at this time. Update 05/08/23: No changes at this time.   Update 05/13/2023: No changes at this time. Update 05/18/2023: No changes at this time. Update 05/23/23: No changes at this time. Update 05/28/23: No changes at this time.  Update 06/02/23:  No changes at this  time. Update 06/07/2023:  No changes at this time.    Patient Goals:  "To stay out of trouble and move back home" 05/03/23 Update: No changes at this time. Update 05/08/23: No changes at this time.   Update 05/13/2023: No changes at this time. Update 05/18/2023: No changes at this time. Update 05/23/23: No changes at this time.  Update 05/28/23: No changes at this time.  Update 06/02/23:  No changes at this time. Update 06/07/2023:  No changes at this time. Update 06/07/2023:  No changes at this time.    Discharge Plan or Barriers: CSW will assist with appropriate discharge planning 05/03/23 Update: No changes at this time. Update 05/08/23: Pt's guardian reports that he cannot come back home due to restraining order. CSW will discuss with leadership next steps for safe discharge planning. Update 05/13/2023: No changes at this time. Update 05/18/2023: Pt awaiting care coordinator through [facility] to assist with placement. Update 05/23/23: No changes at this time.  Update 05/28/23: The patient does not have placement and does not want to go into a shelter. Waiting for a call back from the care coordinators and the patient  dad. Update 06/02/23:  Placement continues to be a concern for the patient at this time.  Father maintains that the patient can not return home.  Patient has has one group home interview and CSW is assisting in identifying the patient's funding source to assist with any possible placement. Update 06/07/2023:  CSW team has located placement for the patient at a group home with [facility].  Patient is expected to discharge on 06/08/2023.  No identified concerns at this time.    Reason for Continuation of Hospitalization: Aggression Medication stabilization   Estimated Length of Stay: 1 to 7 days 05/03/23 Update: No changes at this time. Update 05/08/23: TBD  Update 05/13/2023: TBD Update 05/18/2023: TBD. Update 05/23/23: No changes at this time. Update 05/28/23: TBD  Update 06/02/23:  TBD  Last 3 [place]  Suicide Severity Risk Score: Flowsheet Row Admission (Current) from 04/27/2023 in [facility] ED from 12/31/2022 in [facility] ED from 12/29/2022 in [facility] Emergency Department at [facility]  C-SSRS RISK CATEGORY No Risk No Risk No Risk       Last [name] 2/9 Scores:     No data to display          Scribe for Treatment Team: Harden Mo, Alexander Mt 06/07/2023 10:26 AM

## 2023-06-07 NOTE — Progress Notes (Signed)
   06/07/23 0928  Psych Admission Type (Psych Patients Only)  Admission Status Involuntary  Psychosocial Assessment  Patient Complaints Anxiety  Eye Contact Fair  Facial Expression Animated  Affect Appropriate to circumstance  Speech Logical/coherent  Interaction Assertive  Motor Activity Tremors  Appearance/Hygiene In scrubs  Behavior Characteristics Cooperative;Appropriate to situation  Mood Pleasant  Thought Process  Coherency WDL  Content WDL  Delusions None reported or observed  Perception WDL  Hallucination None reported or observed  Judgment Limited  Confusion None  Danger to Self  Current suicidal ideation? Denies  Agreement Not to Harm Self Yes  Description of Agreement Verbal  Danger to Others  Danger to Others None reported or observed

## 2023-06-07 NOTE — Plan of Care (Signed)
Problem: Education: Goal: Emotional status will improve Outcome: Progressing Goal: Mental status will improve Outcome: Progressing Goal: Verbalization of understanding the information provided will improve Outcome: Progressing

## 2023-06-07 NOTE — Group Note (Signed)
LCSW Group Therapy Note   Group Date: 06/07/2023 Start Time: 1300 End Time: 1401   Type of Therapy and Topic:  Group Therapy: Challenging Core Beliefs  Participation Level:  Did Not Attend  Description of Group:  Patients were educated about core beliefs and asked to identify one harmful core belief that they have. Patients were asked to explore from where those beliefs originate. Patients were asked to discuss how those beliefs make them feel and the resulting behaviors of those beliefs. They were then be asked if those beliefs are true and, if so, what evidence they have to support them. Lastly, group members were challenged to replace those negative core beliefs with helpful beliefs.   Therapeutic Goals:   1. Patient will identify harmful core beliefs and explore the origins of such beliefs. 2. Patient will identify feelings and behaviors that result from those core beliefs. 3. Patient will discuss whether such beliefs are true. 4.  Patient will replace harmful core beliefs with helpful ones.  Summary of Patient Progress:  Patient did not attend group.   Therapeutic Modalities: Cognitive Behavioral Therapy; Solution-Focused Therapy   Lowry Ram, LCSWA 06/07/2023  2:03 PM

## 2023-06-08 ENCOUNTER — Other Ambulatory Visit: Payer: Self-pay

## 2023-06-08 NOTE — BHH Counselor (Signed)
CSW spoke with Deanna at [facility], at this time patient remains on the waitlist.   Penni Homans, MSW, LCSW 06/08/2023 11:35 AM

## 2023-06-08 NOTE — Group Note (Signed)
Recreation Therapy Group Note   Group Topic:Self-Esteem  Group Date: 06/08/2023 Start Time: 1000 End Time: 1100 Facilitators: Rosina Lowenstein, LRT, CTRS Location:  [facility] Room  Group Description: Positive Affirmation Worksheet. Patients and LRT discussed the importance of self-love/self-esteem and things that cause it to fluctuate, including our mental health. Pt completed a worksheet that helps them identify 24 different strengths and qualities about themselves. Pt encouraged to read aloud at least 2 off their sheet to the group. LRT and pts discussed how this can be applied to daily life post-discharge. LRT and patients played positive affirmation bingo afterwards.  Goal Area(s) Addressed: Patient will identify positive qualities about themselves. Patient will learn new positive affirmations.  Patient will recite positive qualities and affirmations aloud to the group.  Patient will practice positive self-talk.  Patient will increase communication.   Affect/Mood: N/A   Participation Level: Did not attend    Clinical Observations/Individualized Feedback: Patient did not attend group.   Plan: Continue to engage patient in RT group sessions 2-3x/week.   Rosina Lowenstein, LRT, CTRS 06/08/2023 12:10 PM

## 2023-06-08 NOTE — Progress Notes (Signed)
Patient isolative to self and room. Did come out for meals and snacks as well as sat in the chair by phone. Minimal interaction with staff and peers. Pleasant and cooperative. Preoccupied. Encouragement and support provided. Safety checks maintained. Medications given as prescribed. Pt receptive and remains safe on unit with q 15 min checks.

## 2023-06-08 NOTE — Plan of Care (Signed)
Problem: Education: Goal: Emotional status will improve Outcome: Progressing Goal: Mental status will improve Outcome: Progressing   Problem: Education: Goal: Mental status will improve Outcome: Progressing   Problem: Safety: Goal: Periods of time without injury will increase Outcome: Progressing

## 2023-06-08 NOTE — Group Note (Signed)
Date:  06/08/2023 Time:  9:47 PM  Group Topic/Focus:  Goals Group:   The focus of this group is to help patients establish daily goals to achieve during treatment and discuss how the patient can incorporate goal setting into their daily lives to aide in recovery.    Participation Level:  Active  Participation Quality:  Appropriate, Attentive, and Sharing  Affect:  Appropriate  Cognitive:  Alert and Appropriate  Insight: Appropriate and Good  Engagement in Group:  Developing/Improving  Modes of Intervention:  Discussion, Orientation, Rapport Building, and Support  Additional Comments:     Elan Brainerd 06/08/2023, 9:47 PM

## 2023-06-08 NOTE — Group Note (Signed)
Date:  06/08/2023 Time:  10:47 AM  Group Topic/Focus:  Goals Group:   The focus of this group is to help patients establish daily goals to achieve during treatment and discuss how the patient can incorporate goal setting into their daily lives to aide in recovery.  Participation Level:  Did Not Attend  Ashara Lounsbury A Suhan Paci 06/08/2023, 10:47 AM

## 2023-06-08 NOTE — Progress Notes (Signed)
   06/08/23 1000  Psych Admission Type (Psych Patients Only)  Admission Status Involuntary  Psychosocial Assessment  Patient Complaints None  Eye Contact Fair  Facial Expression Animated  Affect Appropriate to circumstance  Speech Logical/coherent;Slow  Interaction Assertive  Motor Activity Tremors  Appearance/Hygiene In scrubs  Behavior Characteristics Cooperative;Appropriate to situation  Mood Pleasant  Thought Process  Coherency WDL  Content WDL  Delusions None reported or observed  Perception WDL  Hallucination None reported or observed  Judgment Limited  Confusion None  Danger to Self  Current suicidal ideation? Denies  Self-Injurious Behavior No self-injurious ideation or behavior indicators observed or expressed   Agreement Not to Harm Self Yes  Description of Agreement verbal

## 2023-06-08 NOTE — Progress Notes (Signed)
Pts home meds are locked in med room.

## 2023-06-08 NOTE — Discharge Summary (Signed)
Physician Discharge Summary Note  Patient:  Mark [name] is an 63 y.o., male MRN:  [id number] DOB:  1960-10-04 Patient phone:  [phone] (home)  Patient address:   [address] [place] [address],  Total Time spent with patient: 45 minutes  Date of Admission:  04/27/2023 Date of Discharge: 06/09/2023  Reason for Admission:  Mark [name] is a 63 year old African American male with a history of schizophrenia who was involuntarily admitted to Psychiatry for aggressive behavior towards his father.  Principal Problem: Paranoid schizophrenia Mark [name]) Discharge Diagnoses: Principal Problem:   Paranoid schizophrenia (HCC)  Past Psychiatric History: see h&p  Past Medical History:  Past Medical History:  Diagnosis Date   COPD (chronic obstructive pulmonary disease) (HCC)    Hypertension    Schizophrenia (HCC)     Past Surgical History:  Procedure Laterality Date   LOWER EXTREMITY ANGIOGRAPHY N/A 09/18/2017   Procedure: LOWER EXTREMITY ANGIOGRAPHY- Recheck lysis;  Surgeon: Mark Harman, MD;  Location: [facility] INVASIVE CV LAB;  Service: Cardiovascular;  Laterality: N/A;   PERIPHERAL VASCULAR INTERVENTION Left 09/18/2017   Procedure: PERIPHERAL VASCULAR INTERVENTION;  Surgeon: Mark Harman, MD;  Location: [facility] INVASIVE CV LAB;  Service: Cardiovascular;  Laterality: Left;   THROMBECTOMY FEMORAL ARTERY Left 09/17/2017   Procedure: POSSIBLE THROMBECTOMY;  Surgeon: Mark Harman, MD;  Location: [facility] OR;  Service: Vascular;  Laterality: Left;   VENOGRAM Left 09/17/2017   Procedure: ULTRASOUND POPLITEAL ACCESS; CENTRAL VENOGRAM, IVVS, LYSIS CATHETER PLACEMENT;  Surgeon: Mark Harman, MD;  Location: [facility] OR;  Service: Vascular;  Laterality: Left;   Family History: History reviewed. No pertinent family history. Family Psychiatric  History: see h&p Social History:  Social History   Substance and Sexual Activity  Alcohol Use No     Social History    Substance and Sexual Activity  Drug Use Never    Social History   Socioeconomic History   Marital status: Single    Spouse name: Not on file   Number of children: Not on file   Years of education: Not on file   Highest education level: Not on file  Occupational History   Not on file  Tobacco Use   Smoking status: Every Day    Current packs/day: 0.50    Types: Cigarettes   Smokeless tobacco: Never  Vaping Use   Vaping status: Never Used  Substance and Sexual Activity   Alcohol use: No   Drug use: Never   Sexual activity: Not Currently  Other Topics Concern   Not on file  Social History Narrative   Not on file   Social Drivers of Health   Financial Resource Strain: Not on file  Food Insecurity: No Food Insecurity (04/27/2023)   Hunger Vital Sign    Worried About Running Out of Food in the Last Year: Never true    Ran Out of Food in the Last Year: Never true  Transportation Needs: No Transportation Needs (04/27/2023)   PRAPARE - Administrator, Civil Service (Medical): No    Lack of Transportation (Non-Medical): No  Physical Activity: Not on file  Stress: Not on file  Social Connections: Not on file    Hospital Course:  Mark [name]  was involuntarily admitted to [facility] on 04/27/2023 for aggressive behavior toward his father.   In the context of medication noncompliance.  Upon admission,  the patient was noted to be a poor historian but pleasant and cooperative.  It was also noted that he had  an upper extremity tremor bilaterally.    During the patient's hospitalization, patient had extensive initial psychiatric evaluation and follow-up evaluations daily.  Psychiatric diagnoses provided upon initial assessment are listed above.  Mark [name] was restarted on his home medications including amantadine, amlodipine, aspirin, Depakote, gabapentin, Haldol, and trazodone. [name] was initiated for tardive dyskinesia. During the hospitalization,  other adjustments were made to the patient's psychotropic medication regimen including discontinuation of Haldol and initiation and optimization of Zyprexa for schizophrenia and hydroxyzine as needed for anxiety.     The patient's hair was discussed during the multidisciplinary team meeting overall during the hospitalization.  The patient denied having side effects to prescribed psychotropic medication.  The patient was evaluated daily by a psychiatric provider to ascertain response to treatment.  Improvement was noted by the patient's report of decreasing symptoms., improved sleep and appetite, affect, medication tolerance, behavior, and participation in unit programming.  The patient completed a daily self-inventory noting mood, mental status, pain, new symptoms, anxiety, and any concerns. There were significant challenges in developing a safe discharge plan as Mark [name] was unable to return home due to restraining order and the patient was not appropriate for shelter. Patient was accepted to [facility] group home in [place].  Symptoms were reported as significantly decreased or resolved by discharge. On the day of discharge, Mark [name] reports his mood is stable but "a little anxious". The patient denied having suicidal and homicidal thoughts for a significant period of time prior to discharge. Patient reports chronic auditory hallucinations not causing any acute distress. He denies any visual hallucinations or paranoia. He was motivated to continue taking medication with the goal of continued improvement in mental health. The patient reports his agitation and psychosis responded well to the medication and the patient reports overall benefit from this inpatient hospitalization. Supportive psychotherapy was provided. The patient also participated in regular group therapy while hospitalized. Coping skills, social skills, problem solving, as well as relaxation therapies were also part of the unit  programming.    Musculoskeletal: Strength & Muscle Tone: within normal limits Gait & Station: normal Patient leans: N/A   Psychiatric Specialty Exam:  Presentation  General Appearance:  Appropriate for Environment; Casual  Eye Contact: Good  Speech: Clear and Coherent; Normal Rate  Speech Volume: Normal  Handedness: Right   Mood and Affect  Mood: Euthymic; Anxious  Affect: Blunt   Thought Process  Thought Processes: Coherent  Descriptions of Associations:Intact  Orientation:Full (Time, Place and Person)  Thought Content:Logical  History of Schizophrenia/Schizoaffective disorder:Yes  Duration of Psychotic Symptoms:Greater than six months  Hallucinations:No data recorded  Ideas of Reference:None  Suicidal Thoughts:No data recorded  Homicidal Thoughts:No data recorded   Sensorium  Memory: Immediate Good; Recent Good; Remote Good  Judgment: Fair  Insight: Fair   Chartered certified accountant: Fair  Attention Span: Fair  Recall: Fair  Fund of Knowledge: Fair  Language: Good   Psychomotor Activity  Psychomotor Activity: No data recorded   Assets  Assets: Communication Skills; Housing; Physical Health   Sleep  Sleep: No data recorded    Physical Exam: Physical Exam Vitals and nursing note reviewed.  HENT:     Head: Normocephalic and atraumatic.     Nose: Nose normal.  Eyes:     Extraocular Movements: Extraocular movements intact.  Pulmonary:     Effort: Pulmonary effort is normal.  Musculoskeletal:        General: Normal range of motion.     Cervical back: Normal range of motion.  Skin:    General: Skin is warm and dry.  Neurological:     Mental Status: He is alert and oriented to person, place, and time. Mental status is at baseline.  Psychiatric:        Mood and Affect: Mood normal.        Behavior: Behavior normal.        Thought Content: Thought content normal.        Judgment: Judgment  normal.    Review of Systems  Psychiatric/Behavioral:  The patient is nervous/anxious.   All other systems reviewed and are negative.  Blood pressure 131/76, pulse 63, temperature 98.3 F (36.8 C), resp. rate 20, height 5\' 11"  (1.803 m), weight 71.2 kg, SpO2 100%. Body mass index is 21.9 kg/m.   Social History   Tobacco Use  Smoking Status Every Day   Current packs/day: 0.50   Types: Cigarettes  Smokeless Tobacco Never   Tobacco Cessation:  A prescription for an FDA-approved tobacco cessation medication was offered at discharge and the patient refused   Blood Alcohol level:  Lab Results  Component Value Date   [facility] <10 04/25/2023   ETH <10 12/31/2022    Metabolic Disorder Labs:  Lab Results  Component Value Date   HGBA1C 5.8 (H) 05/28/2023   MPG 119.76 05/28/2023   MPG 111.15 12/31/2022   No results found for: "PROLACTIN" Lab Results  Component Value Date   CHOL 133 12/31/2022   TRIG 39 12/31/2022   HDL 54 12/31/2022   CHOLHDL 2.5 12/31/2022   VLDL 8 12/31/2022   LDLCALC 71 12/31/2022   LDLCALC 69 12/01/2022    See Psychiatric Specialty Exam and Suicide Risk Assessment completed by Attending Physician prior to discharge.  Discharge destination:  Other:  group home  Is patient on multiple antipsychotic therapies at discharge:  No   Has Patient had three or more failed trials of antipsychotic monotherapy by history:  No  Recommended Plan for Multiple Antipsychotic Therapies: NA  Discharge Instructions     Diet - low sodium heart healthy   Complete by: As directed    Increase activity slowly   Complete by: As directed       Allergies as of 06/09/2023   No Known Allergies      Medication List     STOP taking these medications    benztropine 1 MG tablet Commonly known as: COGENTIN   cyclobenzaprine 10 MG tablet Commonly known as: FLEXERIL   haloperidol 10 MG tablet Commonly known as: HALDOL       TAKE these medications       Indication  amantadine 100 MG capsule Commonly known as: SYMMETREL Take 1 capsule (100 mg total) by mouth 2 (two) times daily.  Indication: Extrapyramidal Reaction caused by Medications   amLODipine 5 MG tablet Commonly known as: NORVASC Take 1 tablet (5 mg total) by mouth daily.  Indication: High Blood Pressure   aspirin EC 81 MG tablet Take 1 tablet (81 mg total) by mouth daily. Swallow whole.  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 What changed: when to take this  Indication: aggression   gabapentin 400 MG capsule Commonly known as: NEURONTIN Take 1 capsule (400 mg total) by mouth 2 (two) times daily. What changed:  when to take this Another medication with the same name was removed. Continue taking  this medication, and follow the directions you see here.  Indication: Fine to Coarse Slow Tremor Affecting Head, Hands & Voice   hydrOXYzine 25 MG tablet Commonly known as: ATARAX Take 1 tablet (25 mg total) by mouth 3 (three) times daily as needed (anxious).  Indication: Feeling Anxious   [name] 40 MG capsule Generic drug: valbenazine Take 1 capsule (40 mg total) by mouth daily.  Indication: Tardive Dyskinesia   OLANZapine 5 MG tablet Commonly known as: ZYPREXA Take 3 tablets (15 mg total) by mouth at bedtime.  Indication: Schizophrenia   traZODone 100 MG tablet Commonly known as: DESYREL Take 1 tablet (100 mg total) by mouth at bedtime as needed for sleep. What changed:  medication strength how much to take when to take this reasons to take this  Indication: Trouble Sleeping        Follow-up Information     [facility] Go to.   Why: Virtual appointment is 06/19/23 at 11 AM via the phone and will receive a call at [phone]. Contact information: [address]  [address] [place]  [address] [phone]                - It is recommended to the patient to continue psychiatric medications as prescribed, after discharge from the hospital.   - It is recommended to the patient to follow up with your outpatient psychiatric provider and PCP. - It was discussed with the patient, the impact of alcohol, drugs, tobacco have been there overall psychiatric and medical wellbeing, and total abstinence from substance use was recommended the patient. - 30-day supply of medications were provided to patient at discharge. Patient agreeable to plan. Given opportunity to ask questions. Appears to feel comfortable with discharge.   - In the event of worsening symptoms, the patient is instructed to call the crisis hotline, 911 and or go to the nearest ED for appropriate evaluation and treatment of symptoms. To follow-up with primary care provider for other medical issues, concerns and or health care needs - Patient was discharged to group home with a plan to follow up as noted above.     Signed: Domonique Cothran Robyne Peers, NP

## 2023-06-08 NOTE — Group Note (Signed)
Recreation Therapy Group Note   Group Topic:Health and Wellness  Group Date: 06/08/2023 Start Time: 1530 End Time: 1610 Facilitators: Rosina Lowenstein, LRT, CTRS Location: Courtyard  Group Description: [employer]. LRT and patients played games of basketball, drew with chalk, and played corn hole while outside in the courtyard while getting fresh air and sunlight. Music was being played in the background. LRT and peers conversed about different games they have played before, what they do in their free time and anything else that is on their minds. LRT encouraged pts to drink water after being outside, sweating and getting their heart rate up.  Goal Area(s) Addressed: Patient will build on frustration tolerance skills. Patients will partake in a competitive play game with peers. Patients will gain knowledge of new leisure interest/hobby.    Affect/Mood: Appropriate   Participation Level: Moderate   Participation Quality: Independent   Behavior: Appropriate   Speech/Thought Process: Coherent   Insight: Fair   Judgement: Fair    Modes of Intervention: Activity   Patient Response to Interventions:  Receptive   Education Outcome:  In group clarification offered    Clinical Observations/Individualized Feedback: Mark [name] was mostly active in their participation of session activities and group discussion. Pt left group early. Pt chose to listen to music and speak with MHT while outside. Pt was calm and pleasant.    Plan: Continue to engage patient in RT group sessions 2-3x/week.   [address], LRT, CTRS 06/08/2023 5:27 PM

## 2023-06-08 NOTE — Plan of Care (Signed)
  Problem: Education: Goal: Knowledge of [facility] Education information/materials will improve Outcome: Progressing Goal: Emotional status will improve Outcome: Progressing Goal: Mental status will improve Outcome: Progressing Goal: Verbalization of understanding the information provided will improve Outcome: Progressing   Problem: Education: Goal: Emotional status will improve Outcome: Progressing Goal: Mental status will improve Outcome: Progressing   Problem: Coping: Goal: Ability to verbalize frustrations and anger appropriately will improve Outcome: Progressing Goal: Ability to demonstrate self-control will improve Outcome: Progressing   Problem: Health Behavior/Discharge Planning: Goal: Identification of resources available to assist in meeting health care needs will improve Outcome: Progressing Goal: Compliance with treatment plan for underlying cause of condition will improve Outcome: Progressing   Problem: Physical Regulation: Goal: Ability to maintain clinical measurements within normal limits will improve Outcome: Progressing   Problem: Safety: Goal: Periods of time without injury will increase Outcome: Progressing   Problem: Education: Goal: Knowledge of General Education information will improve Description: Including pain rating scale, medication(s)/side effects and non-pharmacologic comfort measures Outcome: Progressing   Problem: Health Behavior/Discharge Planning: Goal: Ability to manage health-related needs will improve Outcome: Progressing   Problem: Clinical Measurements: Goal: Ability to maintain clinical measurements within normal limits will improve Outcome: Progressing Goal: Will remain free from infection Outcome: Progressing Goal: Diagnostic test results will improve Outcome: Progressing Goal: Respiratory complications will improve Outcome: Progressing Goal: Cardiovascular complication will be avoided Outcome: Progressing    Problem: Activity: Goal: Risk for activity intolerance will decrease Outcome: Progressing   Problem: Nutrition: Goal: Adequate nutrition will be maintained Outcome: Progressing   Problem: Coping: Goal: Level of anxiety will decrease Outcome: Progressing   Problem: Elimination: Goal: Will not experience complications related to bowel motility Outcome: Progressing Goal: Will not experience complications related to urinary retention Outcome: Progressing   Problem: Pain Management: Goal: General experience of comfort will improve Outcome: Progressing   Problem: Safety: Goal: Ability to remain free from injury will improve Outcome: Progressing   Problem: Skin Integrity: Goal: Risk for impaired skin integrity will decrease Outcome: Progressing
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
## 2023-06-08 NOTE — Group Note (Signed)
[facility] LCSW Group Therapy Note   Group Date: 06/08/2023 Start Time: 1300 End Time: 1340   Type of Therapy/Topic:  Group Therapy:  Balance in Life  Participation Level:  Did Not Attend   Description of Group:    This group will address the concept of balance and how it feels and looks when one is unbalanced. Patients will be encouraged to process areas in their lives that are out of balance, and identify reasons for remaining unbalanced. Facilitators will guide patients utilizing problem- solving interventions to address and correct the stressor making their life unbalanced. Understanding and applying boundaries will be explored and addressed for obtaining  and maintaining a balanced life. Patients will be encouraged to explore ways to assertively make their unbalanced needs known to significant others in their lives, using other group members and facilitator for support and feedback.  Therapeutic Goals: Patient will identify two or more emotions or situations they have that consume much of in their lives. Patient will identify signs/triggers that life has become out of balance:  Patient will identify two ways to set boundaries in order to achieve balance in their lives:  Patient will demonstrate ability to communicate their needs through discussion and/or role plays  Summary of Patient Progress: X   Therapeutic Modalities:   Cognitive Behavioral Therapy Solution-Focused Therapy Assertiveness Training   Glenis Smoker, LCSW

## 2023-06-08 NOTE — Plan of Care (Signed)
  Problem: Education: Goal: Emotional status will improve Outcome: Progressing Goal: Mental status will improve Outcome: Progressing Goal: Verbalization of understanding the information provided will improve Outcome: Progressing   Problem: Education: Goal: Emotional status will improve Outcome: Progressing

## 2023-06-08 NOTE — Progress Notes (Signed)
Patient is pleasant and cooperative. Denies SI, HI, AVH. Medication compliant. Appropriate with staff and peers. Voiced no concerns or complaints. Excited about discharge on tomorrow. Encouragement and support provided. Safety checks maintained. Medications given as prescribed. Pt receptive and remains safe on unit with q 15 min checks.

## 2023-06-09 ENCOUNTER — Other Ambulatory Visit: Payer: Self-pay

## 2023-06-09 NOTE — BHH Counselor (Signed)
CSW touched base with Clemmons of patient's group home. Clemmons requested TB test results and FL2. Requested documentation prepared for Clemmons and added to discharge paperwork as requested.   Clemmons confirmed that he will be here to pick the patient up at 2 PM. This has been communicated to team and patient.   CSW team to continue to assess.    Reymundo Poll, MSW, LCSWA 06/09/2023 11:13 AM

## 2023-06-09 NOTE — BHH Counselor (Signed)
CSW touched base with Clemmons of patient's group home at [phone].   Clemmons did not answer. CSW left HIPAA compliant voicemail.    CSW team to continue to assess.   Reymundo Poll, MSW, LCSWA 06/09/2023 10:50 AM

## 2023-06-09 NOTE — Progress Notes (Signed)
Patient ID: Mark [name], male   DOB: 08-03-1960, 63 y.o.   MRN: [id number]  Patient discharged to group home. Denies SI/HI/AVH at discharge. No distress noted.

## 2023-06-09 NOTE — Progress Notes (Signed)
  [facility] Adult Case Management Discharge Plan :  Will you be returning to the same living situation after discharge:  No.Patient to go to [facility]. At discharge, do you have transportation home?: Yes,  CSW to arrange transportation for safe discharge on patient's behalf.  Do you have the ability to pay for your medications:  Yes, [employer] / [employer]   Release of information consent forms completed and in the chart;  Patient's signature needed at discharge.  Patient to Follow up at:  Follow-up Information     [facility] Go to.   Why: Virtual appointment is 06/19/23 at 11 AM via the phone and will receive a call at [phone]. Contact information: [address]  [address] [place] [address] [phone]                 Next level of care provider has access to [facility] Link:no  Safety Planning and Suicide Prevention discussed: Yes,  Bradyn Soward [phone]), has been identified by the patient as the family member/significant other with whom the patient will be residing, and identified as the person(s) who will aid the patient in the event of a mental health crisis (suicidal ideations/suicide attempt).  SPE written material given at discharge.      Has patient been referred to the Quitline?: Patient refused referral for treatment  Patient has been referred for addiction treatment: No known substance use disorder.  Lowry Ram, LCSW 06/09/2023, 9:52 AM

## 2023-06-09 NOTE — Group Note (Signed)
Date:  06/09/2023 Time:  10:27 AM  Group Topic/Focus:  Goals Group:   The focus of this group is to help patients establish daily goals to achieve during treatment and discuss how the patient can incorporate goal setting into their daily lives to aide in recovery.    Participation Level:  Active  Participation Quality:  Appropriate  Affect:  Appropriate  Cognitive:  Appropriate  Insight: Appropriate  Engagement in Group:  Engaged  Modes of Intervention:  Activity  Additional Comments:    Mark [name] 06/09/2023, 10:27 AM

## 2023-06-09 NOTE — Plan of Care (Signed)
   Problem: Education: Goal: Emotional status will improve Outcome: Progressing Goal: Mental status will improve Outcome: Progressing Goal: Verbalization of understanding the information provided will improve Outcome: Progressing

## 2023-06-09 NOTE — Plan of Care (Signed)
  Problem: Education: Goal: Knowledge of [place] [facility] Education information/materials will improve Outcome: Adequate for Discharge Goal: Emotional status will improve 06/09/2023 1343 by Gardiner Barefoot, RN Outcome: Adequate for Discharge 06/09/2023 0951 by Gardiner Barefoot, RN Outcome: Progressing Goal: Mental status will improve 06/09/2023 1343 by Gardiner Barefoot, RN Outcome: Adequate for Discharge 06/09/2023 0951 by Gardiner Barefoot, RN Outcome: Progressing Goal: Verbalization of understanding the information provided will improve 06/09/2023 1343 by Gardiner Barefoot, RN Outcome: Adequate for Discharge 06/09/2023 0951 by Gardiner Barefoot, RN Outcome: Progressing   Problem: Education: Goal: Emotional status will improve Outcome: Adequate for Discharge Goal: Mental status will improve Outcome: Adequate for Discharge   Problem: Coping: Goal: Ability to verbalize frustrations and anger appropriately will improve Outcome: Adequate for Discharge Goal: Ability to demonstrate self-control will improve Outcome: Adequate for Discharge   Problem: Health Behavior/Discharge Planning: Goal: Identification of resources available to assist in meeting health care needs will improve Outcome: Adequate for Discharge Goal: Compliance with treatment plan for underlying cause of condition will improve Outcome: Adequate for Discharge   Problem: Physical Regulation: Goal: Ability to maintain clinical measurements within normal limits will improve Outcome: Adequate for Discharge   Problem: Safety: Goal: Periods of time without injury will increase Outcome: Adequate for Discharge   Problem: Education: Goal: Knowledge of General Education information will improve Description: Including pain rating scale, medication(s)/side effects and non-pharmacologic comfort measures Outcome: Adequate for Discharge   Problem: Health Behavior/Discharge Planning: Goal: Ability to manage  health-related needs will improve Outcome: Adequate for Discharge   Problem: Clinical Measurements: Goal: Ability to maintain clinical measurements within normal limits will improve Outcome: Adequate for Discharge Goal: Will remain free from infection Outcome: Adequate for Discharge Goal: Diagnostic test results will improve Outcome: Adequate for Discharge Goal: Respiratory complications will improve Outcome: Adequate for Discharge Goal: Cardiovascular complication will be avoided Outcome: Adequate for Discharge   Problem: Activity: Goal: Risk for activity intolerance will decrease Outcome: Adequate for Discharge   Problem: Nutrition: Goal: Adequate nutrition will be maintained Outcome: Adequate for Discharge   Problem: Coping: Goal: Level of anxiety will decrease Outcome: Adequate for Discharge   Problem: Elimination: Goal: Will not experience complications related to bowel motility Outcome: Adequate for Discharge Goal: Will not experience complications related to urinary retention Outcome: Adequate for Discharge   Problem: Pain Management: Goal: General experience of comfort will improve Outcome: Adequate for Discharge   Problem: Safety: Goal: Ability to remain free from injury will improve Outcome: Adequate for Discharge   Problem: Skin Integrity: Goal: Risk for impaired skin integrity will decrease Outcome: Adequate for Discharge

## 2023-06-09 NOTE — Group Note (Signed)
Recreation Therapy Group Note   Group Topic:Leisure Education  Group Date: 06/09/2023 Start Time: 1000 End Time: 1110 Facilitators: Rosina Lowenstein, LRT, CTRS Location:  Craft Room  Group Description: Leisure. Patients were given the option to choose from singing karaoke, coloring mandalas, using oil pastels, journaling, or playing with play-doh. LRT and pts discussed the meaning of leisure, the importance of participating in leisure during their free time/when they're outside of the hospital, as well as how our leisure interests can also serve as coping skills.   Goal Area(s) Addressed:  Patient will identify a current leisure interest.  Patient will learn the definition of "leisure". Patient will practice making a positive decision. Patient will have the opportunity to try a new leisure activity. Patient will communicate with peers and LRT.    Affect/Mood: Appropriate   Participation Level: Active and Engaged   Participation Quality: Independent   Behavior: Alert, Appropriate, and Cooperative   Speech/Thought Process: Coherent   Insight: Fair   Judgement: Fair    Modes of Intervention: Clarification, Education, Exploration, Guided Discussion, Music, Rapport Building, and Socialization   Patient Response to Interventions:  Attentive, Engaged, and Receptive   Education Outcome:  Acknowledges education   Clinical Observations/Individualized Feedback: Lawrnce was active in their participation of session activities and group discussion. Pt identified "being outside and walking" as things he does in his free time. Pt chose to listen to music while in group. Pt was observed singing and dancing along with the music and peers singing.    Plan: Continue to engage patient in RT group sessions 2-3x/week.   Rosina Lowenstein, LRT, CTRS 06/09/2023 11:35 AM

## 2023-06-11 NOTE — Progress Notes (Signed)
[facility] MD Progress Note  Mark [name]  MRN:  [id number]  Subjective:  Mark [name] is a 63 year old African American male with a history of schizophrenia, HTN, and COPD who is here due to aggressive behavior toward his father. He has been living with father in [place]. Per father, there is an order of protection in place.  Chart reviewed, case discussed with multidisciplinary team, and patient seen during rounds. No acute changes since yesterday. Mark [name] continues to do well.He is visible in the milieu today, interactive with peers and staff. He is adherent with medication regimen and denies side effects. He is eating and sleeping well. Denies thoughts of harming self and others. Anticipated discharge to group home today but unfortunately group home owner called and rescheduled for tomorrow due issue at home.  30-day supply of medications delivered from [facility] community pharmacy. Plan to discharge to [facility] group home tomorrow.  Principal Problem: Paranoid schizophrenia (HCC) Diagnosis: Principal Problem:   Paranoid schizophrenia (HCC)  Total Time spent with patient: 20 minutes  Past Psychiatric History: see h&p  Past Medical History:  Past Medical History:  Diagnosis Date   COPD (chronic obstructive pulmonary disease) (HCC)    Hypertension    Schizophrenia (HCC)     Past Surgical History:  Procedure Laterality Date   LOWER EXTREMITY ANGIOGRAPHY N/A 09/18/2017   Procedure: LOWER EXTREMITY ANGIOGRAPHY- Recheck lysis;  Surgeon: Maeola Harman, MD;  Location: [facility] INVASIVE CV LAB;  Service: Cardiovascular;  Laterality: N/A;   PERIPHERAL VASCULAR INTERVENTION Left 09/18/2017   Procedure: PERIPHERAL VASCULAR INTERVENTION;  Surgeon: Maeola Harman, MD;  Location: [facility] INVASIVE CV LAB;  Service: Cardiovascular;  Laterality: Left;   THROMBECTOMY FEMORAL ARTERY Left 09/17/2017   Procedure: POSSIBLE THROMBECTOMY;  Surgeon: Maeola Harman, MD;  Location: [facility]  OR;  Service: Vascular;  Laterality: Left;   VENOGRAM Left 09/17/2017   Procedure: ULTRASOUND POPLITEAL ACCESS; CENTRAL VENOGRAM, IVVS, LYSIS CATHETER PLACEMENT;  Surgeon: Maeola Harman, MD;  Location: [facility] OR;  Service: Vascular;  Laterality: Left;   Family History: History reviewed. No pertinent family history. Family Psychiatric  History: see h&p Social History:  Social History   Substance and Sexual Activity  Alcohol Use No     Social History   Substance and Sexual Activity  Drug Use Never    Social History   Socioeconomic History   Marital status: Single    Spouse name: Not on file   Number of children: Not on file   Years of education: Not on file   Highest education level: Not on file  Occupational History   Not on file  Tobacco Use   Smoking status: Every Day    Current packs/day: 0.50    Types: Cigarettes   Smokeless tobacco: Never  Vaping Use   Vaping status: Never Used  Substance and Sexual Activity   Alcohol use: No   Drug use: Never   Sexual activity: Not Currently  Other Topics Concern   Not on file  Social History Narrative   Not on file   Social Drivers of Health   Financial Resource Strain: Not on file  Food Insecurity: No Food Insecurity (04/27/2023)   Hunger Vital Sign    Worried About Running Out of Food in the Last Year: Never true    Ran Out of Food in the Last Year: Never true  Transportation Needs: No Transportation Needs (04/27/2023)   PRAPARE - Transportation    Lack of Transportation (Medical): No    Lack  of Transportation (Non-Medical): No  Physical Activity: Not on file  Stress: Not on file  Social Connections: Not on file                      Sleep: Good  Appetite:  Good  Current Medications: No current facility-administered medications for this encounter.   Current Outpatient Medications  Medication Sig Dispense Refill   amantadine (SYMMETREL) 100 MG capsule Take 1 capsule (100 mg total) by mouth 2  (two) times daily. 60 capsule 0   amLODipine (NORVASC) 5 MG tablet Take 1 tablet (5 mg total) by mouth daily. 30 tablet 0   aspirin EC 81 MG tablet Take 1 tablet (81 mg total) by mouth daily. Swallow whole. 30 tablet 12   divalproex (DEPAKOTE ER) 500 MG 24 hr tablet Take 1 tablet (500 mg total) by mouth 2 (two) times daily. 60 tablet 0   gabapentin (NEURONTIN) 400 MG capsule Take 1 capsule (400 mg total) by mouth 2 (two) times daily. 60 capsule 0   hydrOXYzine (ATARAX) 25 MG tablet Take 1 tablet (25 mg total) by mouth 3 (three) times daily as needed (anxious). 30 tablet 0   OLANZapine (ZYPREXA) 5 MG tablet Take 3 tablets (15 mg total) by mouth at bedtime. 90 tablet 0   traZODone (DESYREL) 100 MG tablet Take 1 tablet (100 mg total) by mouth at bedtime as needed for sleep. 30 tablet 0   valbenazine ([name]) 40 MG capsule Take 1 capsule (40 mg total) by mouth daily. 30 capsule 0    Lab Results:  No results found for this or any previous visit (from the past 48 hours).   Blood Alcohol level:  Lab Results  Component Value Date   ETH <10 04/25/2023   ETH <10 12/31/2022    Metabolic Disorder Labs: Lab Results  Component Value Date   HGBA1C 5.8 (H) 05/28/2023   MPG 119.76 05/28/2023   MPG 111.15 12/31/2022   No results found for: "PROLACTIN" Lab Results  Component Value Date   CHOL 133 12/31/2022   TRIG 39 12/31/2022   HDL 54 12/31/2022   CHOLHDL 2.5 12/31/2022   VLDL 8 12/31/2022   LDLCALC 71 12/31/2022   LDLCALC 69 12/01/2022     Musculoskeletal: Strength & Muscle Tone: within normal limits Gait & Station: normal Patient leans: N/A  Psychiatric Specialty Exam:  Presentation  General Appearance:  Appropriate for Environment; Casual  Eye Contact: Good  Speech: Clear and Coherent; Normal Rate  Speech Volume: Normal  Handedness: Right   Mood and Affect  Mood: "Good"  Affect: Blunt   Thought Process  Thought Processes: Coherent  Descriptions of  Associations:Intact  Orientation:Full (Time, Place and Person)  Thought Content:Logical  History of Schizophrenia/Schizoaffective disorder:Yes  Duration of Psychotic Symptoms:Greater than six months  Hallucinations:Denies  Ideas of Reference:None  Suicidal Thoughts:Denies  Homicidal Thoughts:Denies   Sensorium  Memory: Immediate Good; Recent Good; Remote Good  Judgment: Fair  Insight: Fair   Chartered certified accountant: Fair  Attention Span: Fair   Language: Good   Psychomotor Activity  Psychomotor Activity: Normal    Assets  Assets: Manufacturing systems engineer; Housing; Physical Health   Sleep  Sleep: Fair     Physical Exam: Physical Exam Vitals and nursing note reviewed.  HENT:     Head: Normocephalic and atraumatic.     Nose: Nose normal.  Eyes:     Extraocular Movements: Extraocular movements intact.  Pulmonary:     Effort: Pulmonary effort  is normal.  Musculoskeletal:        General: Normal range of motion.     Cervical back: Normal range of motion.  Skin:    General: Skin is warm and dry.  Neurological:     Mental Status: He is alert and oriented to person, place, and time. Mental status is at baseline.  Psychiatric:        Mood and Affect: Mood normal.        Behavior: Behavior normal.        Thought Content: Thought content normal.        Judgment: Judgment normal.    Review of Systems  All other systems reviewed and are negative.  Blood pressure 131/76, pulse 63, temperature 98.3 F (36.8 C), resp. rate 20, height 5\' 11"  (1.803 m), weight 71.2 kg, SpO2 100%. Body mass index is 21.9 kg/m.   Treatment Plan Summary: Daily contact with patient to assess and evaluate symptoms and progress in treatment and Medication management  Continue olanzapine 15 mg at bedtime for schizophrenia Continue Depakote DR 500 mg po BID for mood Continue [name] 40 mg p.o. daily for TD Continue Norvasc 5 mg by mouth daily for  HTN Continue ASA 81mg  PO daily Continue amantadine 100mg  PO BID    Valproic acid level : 60, ALT/AST 10/16, QTC 438 05/28/23: HgbA1c 5.8   Helia Haese E Jakari Sada, NP

## 2023-06-27 ENCOUNTER — Emergency Department: Payer: MEDICAID

## 2023-06-27 ENCOUNTER — Emergency Department
Admission: EM | Admit: 2023-06-27 | Discharge: 2023-06-27 | Disposition: A | Discharge status: Home / Self Care | Payer: MEDICAID | Attending: Emergency Medicine | Admitting: Emergency Medicine

## 2023-06-27 ENCOUNTER — Other Ambulatory Visit: Payer: Self-pay

## 2023-06-27 DIAGNOSIS — M545 Low back pain, unspecified: Secondary | ICD-10-CM | POA: Insufficient documentation

## 2023-06-27 DIAGNOSIS — J449 Chronic obstructive pulmonary disease, unspecified: Secondary | ICD-10-CM | POA: Diagnosis not present

## 2023-06-27 DIAGNOSIS — I10 Essential (primary) hypertension: Secondary | ICD-10-CM | POA: Insufficient documentation

## 2023-06-27 DIAGNOSIS — R030 Elevated blood-pressure reading, without diagnosis of hypertension: Secondary | ICD-10-CM

## 2023-06-27 MED ORDER — ACETAMINOPHEN 325 MG PO TABS
650.0000 mg | ORAL_TABLET | Freq: Once | ORAL | Status: AC
  Administered 2023-06-27: 650 mg via ORAL
  Filled 2023-06-27: qty 2

## 2023-06-27 MED ORDER — NAPROXEN 500 MG PO TABS: 500.0000 mg | ORAL_TABLET | Freq: Two times a day (BID) | ORAL | 0 refills | Status: AC

## 2023-06-27 MED ORDER — METHOCARBAMOL 500 MG PO TABS: 500.0000 mg | ORAL_TABLET | Freq: Three times a day (TID) | ORAL | 0 refills | Status: AC | PRN

## 2023-06-27 MED ORDER — METHOCARBAMOL 500 MG PO TABS
500.0000 mg | ORAL_TABLET | Freq: Once | ORAL | Status: AC
  Administered 2023-06-27: 500 mg via ORAL
  Filled 2023-06-27: qty 1

## 2023-06-27 NOTE — ED Triage Notes (Signed)
First nurse note: pt to ED [facility] from [facility] group home for back pain. Oriented per EMS.

## 2023-06-27 NOTE — Discharge Instructions (Addendum)
Follow-up with your primary care provider if any continued problems.  Also take your blood pressure medication when you get home as it was elevated in the emergency department he should be taking it every day.  Continue with your regular medication.  A prescription for methocarbamol was sent to the pharmacy for you to take every 8 hours for 1 day and naproxen twice daily with food.

## 2023-06-27 NOTE — ED Provider Notes (Signed)
[facility] Provider Note    Event Date/Time   First MD Initiated Contact with Patient 06/27/23 [phone]     (approximate)   History   Back Pain   HPI  Mark [name] is a 63 y.o. male   presents to the ED via EMS from [facility] family care group home with complaint of back pain.  Patient denies any recent falls or injuries.  He states pain is worse with movement.  No urinary symptoms per patient.  Patient has history of hypertension, schizophrenia, paranoia, bipolar, COPD, DVT, AKI, AMS, hypernatremia, tardive dyskinesia, acute encephalopathy and drug overdose.      Physical Exam   Triage Vital Signs: ED Triage Vitals  Encounter Vitals Group     BP 06/27/23 0837 (!) 194/96     Systolic BP Percentile --      Diastolic BP Percentile --      Pulse Rate 06/27/23 0837 62     Resp 06/27/23 0837 17     Temp 06/27/23 0837 97.8 F (36.6 C)     Temp src --      SpO2 06/27/23 0837 97 %     Weight 06/27/23 0839 168 lb (76.2 kg)     Height 06/27/23 0839 5\' 11"  (1.803 m)     Head Circumference --      Peak Flow --      Pain Score 06/27/23 0839 10     Pain Loc --      Pain Education --      Exclude from Growth Chart --     Most recent vital signs: Vitals:   06/27/23 0837 06/27/23 1054  BP: (!) 194/96 (!) 180/107  Pulse: 62 66  Resp: 17 18  Temp: 97.8 F (36.6 C)   SpO2: 97% 98%     General: Awake, no distress.  Supine, no acute distress. CV:  Good peripheral perfusion.  Heart regular rate rhythm. Resp:  Normal effort.  Lungs clear bilaterally. Abd:  No distention.  Soft, flat, nontender.  Bowel sounds normoactive x 4 quadrants. Other:  Tenderness noted on palpation of the lower lumbar, L4-L5 and L5-S1 area and paravertebral muscles bilaterally.  Patient is able move upper and lower extremities without any difficulty.  Range of motion increases patient pain.   ED Results / Procedures / Treatments   Labs (all labs ordered are listed, but only  abnormal results are displayed) Labs Reviewed - No data to display   RADIOLOGY On x-ray images reviewed and interpreted myself independent of the radiologist and no acute findings but degenerative changes were noted.  Radiology report also shows degenerative changes.    PROCEDURES:  Critical Care performed:   Procedures   MEDICATIONS ORDERED IN ED: Medications  methocarbamol (ROBAXIN) tablet 500 mg (500 mg Oral Given 06/27/23 1048)  acetaminophen (TYLENOL) tablet 650 mg (650 mg Oral Given 06/27/23 1048)     IMPRESSION / MDM / ASSESSMENT AND PLAN / ED COURSE  I reviewed the triage vital signs and the nursing notes.   Differential diagnosis includes, but is not limited to, back pain/chronic versus acute, lumbar strain, compression fracture, urolithiasis, UTI, musculoskeletal pain.  63 year old male presents to the ED with complaint of low back pain without history of injury.  Patient was brought to the ED via EMS from a group home.  He reports that he was not given his blood pressure medication prior to arrival.  X-rays of the lumbar spine shows degenerative changes only.  Patient  was given methocarbamol while in the ED and was able to ambulate with minimal assistance.  Patient is to follow-up with his PCP if any continued problems.  A prescription for naproxen twice daily and a prescription for methocarbamol 500 mg every 8 hours #4 was sent to the pharmacy.  Patient was made aware that this medication could cause drowsiness.      Patient's presentation is most consistent with acute complicated illness / injury requiring diagnostic workup.  FINAL CLINICAL IMPRESSION(S) / ED DIAGNOSES   Final diagnoses:  Acute midline low back pain without sciatica  Elevated blood pressure reading     Rx / DC Orders   ED Discharge Orders          Ordered    methocarbamol (ROBAXIN) 500 MG tablet  Every 8 hours PRN        06/27/23 1317    naproxen (NAPROSYN) 500 MG tablet  2 times daily  with meals        06/27/23 1317             Note:  This document was prepared using Dragon voice recognition software and may include unintentional dictation errors.   Tommi Rumps, PA-C 06/27/23 1329    Delton Prairie, MD 06/27/23 [phone]

## 2023-10-27 ENCOUNTER — Other Ambulatory Visit: Payer: Self-pay

## 2023-10-27 ENCOUNTER — Encounter (HOSPITAL_COMMUNITY): Payer: Self-pay

## 2023-10-27 ENCOUNTER — Emergency Department (HOSPITAL_COMMUNITY)

## 2023-10-27 ENCOUNTER — Emergency Department (HOSPITAL_COMMUNITY): Admission: EM | Admit: 2023-10-27 | Discharge: 2023-10-27 | Disposition: A | Discharge status: Home / Self Care

## 2023-10-27 DIAGNOSIS — R0602 Shortness of breath: Secondary | ICD-10-CM | POA: Insufficient documentation

## 2023-10-27 DIAGNOSIS — Z7982 Long term (current) use of aspirin: Secondary | ICD-10-CM | POA: Diagnosis not present

## 2023-10-27 DIAGNOSIS — Z79899 Other long term (current) drug therapy: Secondary | ICD-10-CM | POA: Diagnosis not present

## 2023-10-27 DIAGNOSIS — I10 Essential (primary) hypertension: Secondary | ICD-10-CM | POA: Insufficient documentation

## 2023-10-27 LAB — BASIC METABOLIC PANEL WITH GFR
Anion gap: 15 (ref 5–15)
BUN: 12 mg/dL (ref 8–23)
CO2: 20 mmol/L — ABNORMAL LOW (ref 22–32)
Calcium: 10.4 mg/dL — ABNORMAL HIGH (ref 8.9–10.3)
Chloride: 104 mmol/L (ref 98–111)
Creatinine, Ser: 1.23 mg/dL (ref 0.61–1.24)
GFR, Estimated: >60 mL/min (ref >60)
Glucose, Bld: 97 mg/dL (ref 70–99)
Potassium: 4 mmol/L (ref 3.5–5.1)
Sodium: 139 mmol/L (ref 135–145)

## 2023-10-27 LAB — I-STAT CHEM 8, ED
BUN: 13 mg/dL (ref 8–23)
Calcium, Ion: 1.23 mmol/L (ref 1.15–1.40)
Chloride: 105 mmol/L (ref 98–111)
Creatinine, Ser: 1.1 mg/dL (ref 0.61–1.24)
Glucose, Bld: 97 mg/dL (ref 70–99)
HCT: 46 % (ref 39.0–52.0)
Hemoglobin: 15.6 g/dL (ref 13.0–17.0)
Potassium: 3.9 mmol/L (ref 3.5–5.1)
Sodium: 139 mmol/L (ref 135–145)
TCO2: 23 mmol/L (ref 22–32)

## 2023-10-27 LAB — CBC
HCT: 46.6 % (ref 39.0–52.0)
Hemoglobin: 15.4 g/dL (ref 13.0–17.0)
MCH: 29.2 pg (ref 26.0–34.0)
MCHC: 33 g/dL (ref 30.0–36.0)
MCV: 88.3 fL (ref 80.0–100.0)
Platelets: 252 10*3/uL (ref 150–400)
RBC: 5.28 MIL/uL (ref 4.22–5.81)
RDW: 12.3 % (ref 11.5–15.5)
WBC: 7.4 10*3/uL (ref 4.0–10.5)
nRBC: 0 % (ref 0.0–0.2)

## 2023-10-27 LAB — I-STAT VENOUS BLOOD GAS, ED
Acid-Base Excess: 3 mmol/L — ABNORMAL HIGH (ref 0.0–2.0)
Bicarbonate: 23.9 mmol/L (ref 20.0–28.0)
Calcium, Ion: 1.26 mmol/L (ref 1.15–1.40)
HCT: 47 % (ref 39.0–52.0)
Hemoglobin: 16 g/dL (ref 13.0–17.0)
O2 Saturation: 41 %
Potassium: 3.6 mmol/L (ref 3.5–5.1)
Sodium: 140 mmol/L (ref 135–145)
TCO2: 25 mmol/L (ref 22–32)
pCO2, Ven: 26.6 mmHg — ABNORMAL LOW (ref 44–60)
pH, Ven: 7.562 — ABNORMAL HIGH (ref 7.25–7.43)
pO2, Ven: 19 mmHg — CL (ref 32–45)

## 2023-10-27 LAB — TROPONIN I (HIGH SENSITIVITY)
Troponin I (High Sensitivity): 6 ng/L (ref <18)
Troponin I (High Sensitivity): 5 ng/L (ref <18)

## 2023-10-27 MED ORDER — LORAZEPAM 2 MG/ML IJ SOLN
1.0000 mg | Freq: Once | INTRAMUSCULAR | Status: AC
  Administered 2023-10-27: 1 mg via INTRAVENOUS
  Filled 2023-10-27: qty 1

## 2023-10-27 MED ORDER — IOHEXOL 350 MG/ML SOLN
75.0000 mL | Freq: Once | INTRAVENOUS | Status: AC | PRN
  Administered 2023-10-27: 75 mL via INTRAVENOUS

## 2023-10-27 MED ORDER — IPRATROPIUM-ALBUTEROL 0.5-2.5 (3) MG/3ML IN SOLN
3.0000 mL | Freq: Once | RESPIRATORY_TRACT | Status: AC
  Administered 2023-10-27: 3 mL via RESPIRATORY_TRACT
  Filled 2023-10-27: qty 3

## 2023-10-27 NOTE — ED Notes (Signed)
 RN got a room saturation at 57%, RT called at this time. MD at bedside

## 2023-10-27 NOTE — Progress Notes (Signed)
 RT called to bedside for low O2 sats. RT placed pt on a NRB and tried to get a good O2 reading on pt. Sats then showed to be in the 70s on NRB so RT attempted to place pt on bipap. Pt was then getting very agitated and yelling and the Bipap machine seemed to be having issues, showing to have a 100% leak despite RT trouble shooting machine, tightening the face mask and checking for any lose connections. RT then took pt off bipap, placed pt back on NRB to set up for heated HFNC. Pt placed on HHFNC 50L/100%, sats then came up into the 90s. Pt tolerating it well at this time.

## 2023-10-27 NOTE — Progress Notes (Signed)
 Pt taken off of HHFNC and placed on [place] per MD. Pt tolerating well no distress noted at this time.

## 2023-10-27 NOTE — Discharge Instructions (Addendum)
 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  They should call to schedule an appointment.  However if you do not hear from them in the next 48 hours, you will need to call and schedule an appointment.  Their number is attached.  If you develop chest pain radiating to your back, lightheadedness, passout or any new or worsening symptoms please return to the emergency department immediately.  Continue to take your Eliquis  for your blood clot in your leg.

## 2023-10-27 NOTE — ED Triage Notes (Signed)
 [facility] that started 2 days ago. DVT diagnosed yesterday left lower leg. Pt started elliquis today. Axox4. Room air was 70%.

## 2023-10-27 NOTE — ED Provider Notes (Signed)
 [place] EMERGENCY DEPARTMENT AT [facility] Provider Note   CSN: [id number] Arrival date & time: 10/27/23  1629     Patient presents with: Shortness of Breath   Mark [name] is a 63 y.o. male.   This is a 63 year old male presenting emergency department for shortness of breath.  Reports symptoms for the past 2 days.  No rhinorrhea, cough.  Denies prior pulmonary history.  Not having chest pain, no lightheadedness, no near syncope.   Shortness of Breath      Prior to Admission medications   Medication Sig Start Date End Date Taking? Authorizing Provider  amantadine  (SYMMETREL ) 100 MG capsule Take 1 capsule (100 mg total) by mouth 2 (two) times daily. 06/07/23   Remington, Amber E, NP  amLODipine  (NORVASC ) 5 MG tablet Take 1 tablet (5 mg total) by mouth daily. 06/07/23   Remington, Amber E, NP  aspirin  EC 81 MG tablet Take 1 tablet (81 mg total) by mouth daily. Swallow whole. 06/08/23   Remington, Amber E, NP  divalproex  (DEPAKOTE  ER) 500 MG 24 hr tablet Take 1 tablet (500 mg total) by mouth 2 (two) times daily. 06/08/23   Remington, Amber E, NP  gabapentin  (NEURONTIN ) 400 MG capsule Take 1 capsule (400 mg total) by mouth 2 (two) times daily. 06/07/23   Remington, Amber E, NP  hydrOXYzine  (ATARAX ) 25 MG tablet Take 1 tablet (25 mg total) by mouth 3 (three) times daily as needed (anxious). 06/07/23   Remington, Amber E, NP  methocarbamol  (ROBAXIN ) 500 MG tablet Take 1 tablet (500 mg total) by mouth every 8 (eight) hours as needed for muscle spasms. 06/27/23   Stafford Eagles, PA-C  naproxen  (NAPROSYN ) 500 MG tablet Take 1 tablet (500 mg total) by mouth 2 (two) times daily with a meal. 06/27/23   Stafford Eagles, PA-C  OLANZapine  (ZYPREXA ) 5 MG tablet Take 3 tablets (15 mg total) by mouth at bedtime. 06/07/23   Remington, Amber E, NP  traZODone  (DESYREL ) 100 MG tablet Take 1 tablet (100 mg total) by mouth at bedtime as needed for sleep. 06/07/23   Remington, Amber E, NP   valbenazine  ([name] ) 40 MG capsule Take 1 capsule (40 mg total) by mouth daily. 06/08/23   Remington, Amber E, NP    Allergies: Patient has no known allergies.    Review of Systems  Respiratory:  Positive for shortness of breath.     Updated Vital Signs BP (!) 121/96   Pulse 78   Temp 97.6 F (36.4 C) (Oral)   Resp 20   Ht 5' 10 (1.778 m)   Wt 76.2 kg   SpO2 100%   BMI 24.10 kg/m   Physical Exam Vitals and nursing note reviewed.  Constitutional:      General: He is not in acute distress.    Appearance: He is not toxic-appearing.  HENT:     Head: Normocephalic.   Cardiovascular:     Rate and Rhythm: Normal rate and regular rhythm.  Pulmonary:     Effort: Pulmonary effort is normal. Tachypnea present.     Breath sounds: No wheezing, rhonchi or rales.   Musculoskeletal:     Cervical back: Normal range of motion.     Right lower leg: No edema.     Left lower leg: No edema.   Skin:    General: Skin is warm.     Capillary Refill: Capillary refill takes less than 2 seconds.   Neurological:     Mental  Status: He is alert and oriented to person, place, and time.   Psychiatric:        Mood and Affect: Mood normal.        Behavior: Behavior normal.     (all labs ordered are listed, but only abnormal results are displayed) Labs Reviewed  BASIC METABOLIC PANEL WITH GFR - Abnormal; Notable for the following components:      Result Value   CO2 20 (*)    Calcium 10.4 (*)    All other components within normal limits  I-STAT VENOUS BLOOD GAS, ED - Abnormal; Notable for the following components:   pH, Ven 7.562 (*)    pCO2, Ven 26.6 (*)    pO2, Ven 19 (*)    Acid-Base Excess 3.0 (*)    All other components within normal limits  CBC  I-STAT CHEM 8, ED  TROPONIN I (HIGH SENSITIVITY)  TROPONIN I (HIGH SENSITIVITY)    EKG: EKG Interpretation Date/Time:  Friday October 27 2023 16:43:52 EDT Ventricular Rate:  68 PR Interval:  181 QRS Duration:  104 QT  Interval:  418 QTC Calculation: 445 R Axis:   70  Text Interpretation: Sinus rhythm Probable left ventricular hypertrophy Confirmed by Elise Guile [phone]) on 10/27/2023 4:47:20 PM  Radiology: CT Angio Chest PE W and/or Wo Contrast Result Date: 10/27/2023 CLINICAL DATA:  High probability pulmonary embolism, dyspnea, DVT EXAM: CT ANGIOGRAPHY CHEST WITH CONTRAST TECHNIQUE: Multidetector CT imaging of the chest was performed using the standard protocol during bolus administration of intravenous contrast. Multiplanar CT image reconstructions and MIPs were obtained to evaluate the vascular anatomy. RADIATION DOSE REDUCTION: This exam was performed according to the departmental dose-optimization program which includes automated exposure control, adjustment of the mA and/or kV according to patient size and/or use of iterative reconstruction technique. CONTRAST:  75mL OMNIPAQUE  IOHEXOL  350 MG/ML SOLN COMPARISON:  03/18/2019 FINDINGS: Cardiovascular: There is adequate opacification of the pulmonary arterial tree. No intraluminal filling defect identified to suggest acute pulmonary embolism. The central pulmonary arteries are of normal caliber. Global cardiac size is within normal limits. No significant coronary artery calcification. Trace pericardial effusion. There is progressive fusiform dilation of the descending thoracic aorta which measures 3.5 cm in greatest diameter (previously measuring 3.2 cm). There is, additionally, crescentic high attenuation seen within the wall of the descending thoracic aorta extending from the level of the inferior left pulmonary vein to the diaphragmatic hiatus compatible with a small intramural hematoma. The ascending aorta is of normal caliber. Mild superimposed atherosclerotic calcification. Mediastinum/Nodes: No enlarged mediastinal, hilar, or axillary lymph nodes. Thyroid  gland, trachea, and esophagus demonstrate no significant findings. Lungs/Pleura: Mild emphysema. Lungs are  clear. No pleural effusion or pneumothorax. Upper Abdomen: No acute abnormality. Musculoskeletal: No acute bone abnormality. No lytic or blastic bone lesion. Osseous structures are age appropriate. Review of the MIP images confirms the above findings. IMPRESSION: 1. No acute pulmonary embolism. 2. 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 3. High attenuation within the wall of the descending thoracic aorta extending from the level of the inferior left pulmonary vein to the diaphragmatic hiatus is compatible with a small intramural hematoma. Short-term follow-up thoracic CT arteriography is recommended to document stability or resolution. Vascular consultation is advised. 4. Mild emphysema. Aortic Atherosclerosis (ICD10-I70.0) and Emphysema (ICD10-J43.9). Electronically Signed   By: Worthy Heads M.D.   On: 10/27/2023 21:29   DG Chest Portable 1 View Result Date: 10/27/2023 CLINICAL DATA:  Short of  breath.  Deep venous thrombosis EXAM: PORTABLE CHEST 1 VIEW COMPARISON:  06/28/2021 FINDINGS: Normal mediastinum and cardiac silhouette. Normal pulmonary vasculature. No evidence of effusion, infiltrate, or pneumothorax. No acute bony abnormality. IMPRESSION: No acute cardiopulmonary process. Electronically Signed   By: Deboraha Fallow M.D.   On: 10/27/2023 19:21     Procedures   Medications Ordered in the ED  ipratropium-albuterol  (DUONEB) 0.5-2.5 (3) MG/3ML nebulizer solution 3 mL (3 mLs Nebulization Given 10/27/23 1717)  LORazepam  (ATIVAN ) injection 1 mg (1 mg Intravenous Given 10/27/23 1759)  iohexol  (OMNIPAQUE ) 350 MG/ML injection 75 mL (75 mLs Intravenous Contrast Given 10/27/23 2115)    Clinical Course as of 10/27/23 2318  Fri Oct 27, 2023  1706 DG Chest Portable 1 View X-ray without obvious pneumonia or pneumothorax on my independent review. [TY]  2215 CT Angio Chest PE W and/or Wo Contrast Discussed findings with vascular surgery  who is reviewing the images and will call back with further recommendations. [TY]  2238 CT Angio Chest PE W and/or Wo Contrast MPRESSION: 1. No acute pulmonary embolism. 2. 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 3. High attenuation within the wall of the descending thoracic aorta extending from the level of the inferior left pulmonary vein to the diaphragmatic hiatus is compatible with a small intramural hematoma. Short-term follow-up thoracic CT arteriography is recommended to document stability or resolution. Vascular consultation is advised. 4. Mild emphysema.   [TY]    Clinical Course User Index [TY] Rolinda Climes, DO                                 Medical Decision Making 63 year old male presented the emergency department for shortness of breath.  social determinant of health; patient in police custody.  He is afebrile, nontachycardic, he is tachypneic, slightly hypertensive.  Initially was not hypoxic, but then had some borderline readings with questionable waveform on the monitor.  Out of abundance of caution was placed on oxygen.  Given a breathing treatment.  However, lungs clear with good air movement.  He does appear quite anxious, was given Ativan .  Per review of paperwork that was sent with him he was started on Eliquis  yesterday for a DVT.  CTA obtained negative for PE did show aortic aneurysm and small intramural thrombus of the descending aorta.  Case discussed with vascular surgery.  See ED course.  Nothing to do and can follow-up outpatient.  Patient was taken off oxygen and has remained without desaturation.  No leukocytosis to suggest systemic infection.  No anemia.  No metabolic derangements.  Troponin negative.  Stable for discharge.  Amount and/or Complexity of Data Reviewed Labs: ordered. Radiology: ordered. Decision-making details documented in ED Course. ECG/medicine tests:  ordered.  Risk Prescription drug management.      Final diagnoses:  SOB (shortness of breath)    ED Discharge Orders          Ordered    Ambulatory referral to Vascular Surgery        10/27/23 2237               Rolinda Climes, DO 10/27/23 2318

## 2023-12-14 ENCOUNTER — Emergency Department (HOSPITAL_COMMUNITY): Payer: MEDICAID

## 2023-12-14 ENCOUNTER — Emergency Department (HOSPITAL_COMMUNITY)
Admission: EM | Admit: 2023-12-14 | Discharge: 2023-12-14 | Disposition: A | Discharge status: Home / Self Care | Payer: MEDICAID | Attending: Emergency Medicine | Admitting: Emergency Medicine

## 2023-12-14 ENCOUNTER — Other Ambulatory Visit: Payer: Self-pay

## 2023-12-14 DIAGNOSIS — I10 Essential (primary) hypertension: Secondary | ICD-10-CM | POA: Insufficient documentation

## 2023-12-14 DIAGNOSIS — Z79899 Other long term (current) drug therapy: Secondary | ICD-10-CM | POA: Insufficient documentation

## 2023-12-14 DIAGNOSIS — E876 Hypokalemia: Secondary | ICD-10-CM | POA: Diagnosis not present

## 2023-12-14 DIAGNOSIS — R739 Hyperglycemia, unspecified: Secondary | ICD-10-CM | POA: Diagnosis not present

## 2023-12-14 DIAGNOSIS — J449 Chronic obstructive pulmonary disease, unspecified: Secondary | ICD-10-CM | POA: Insufficient documentation

## 2023-12-14 DIAGNOSIS — Z7982 Long term (current) use of aspirin: Secondary | ICD-10-CM | POA: Diagnosis not present

## 2023-12-14 DIAGNOSIS — R0602 Shortness of breath: Secondary | ICD-10-CM | POA: Diagnosis present

## 2023-12-14 LAB — TROPONIN I (HIGH SENSITIVITY): Troponin I (High Sensitivity): 10 ng/L (ref <18)

## 2023-12-14 LAB — BASIC METABOLIC PANEL WITH GFR
Anion gap: 15 (ref 5–15)
BUN: 12 mg/dL (ref 8–23)
CO2: 23 mmol/L (ref 22–32)
Calcium: 11.5 mg/dL — ABNORMAL HIGH (ref 8.9–10.3)
Chloride: 105 mmol/L (ref 98–111)
Creatinine, Ser: 1.23 mg/dL (ref 0.61–1.24)
GFR, Estimated: >60 mL/min (ref >60)
Glucose, Bld: 133 mg/dL — ABNORMAL HIGH (ref 70–99)
Potassium: 3.3 mmol/L — ABNORMAL LOW (ref 3.5–5.1)
Sodium: 143 mmol/L (ref 135–145)

## 2023-12-14 LAB — CBC WITH DIFFERENTIAL/PLATELET
Abs Immature Granulocytes: 0.01 K/uL (ref 0.00–0.07)
Basophils Absolute: 0 K/uL (ref 0.0–0.1)
Basophils Relative: 0 %
Eosinophils Absolute: 0 K/uL (ref 0.0–0.5)
Eosinophils Relative: 1 %
HCT: 49.4 % (ref 39.0–52.0)
Hemoglobin: 15.3 g/dL (ref 13.0–17.0)
Immature Granulocytes: 0 %
Lymphocytes Relative: 40 %
Lymphs Abs: 2.4 K/uL (ref 0.7–4.0)
MCH: 27.6 pg (ref 26.0–34.0)
MCHC: 31 g/dL (ref 30.0–36.0)
MCV: 89 fL (ref 80.0–100.0)
Monocytes Absolute: 0.4 K/uL (ref 0.1–1.0)
Monocytes Relative: 6 %
Neutro Abs: 3.2 K/uL (ref 1.7–7.7)
Neutrophils Relative %: 53 %
Platelets: 292 K/uL (ref 150–400)
RBC: 5.55 MIL/uL (ref 4.22–5.81)
RDW: 12.9 % (ref 11.5–15.5)
WBC: 6 K/uL (ref 4.0–10.5)
nRBC: 0 % (ref 0.0–0.2)

## 2023-12-14 LAB — BRAIN NATRIURETIC PEPTIDE: B Natriuretic Peptide: 14.9 pg/mL (ref 0.0–100.0)

## 2023-12-14 LAB — D-DIMER, QUANTITATIVE: D-Dimer, Quant: <0.27 ug{FEU}/mL (ref 0.00–0.50)

## 2023-12-14 MED ORDER — POTASSIUM CHLORIDE CRYS ER 20 MEQ PO TBCR: 20.0000 meq | EXTENDED_RELEASE_TABLET | Freq: Two times a day (BID) | ORAL | 0 refills | Status: AC

## 2023-12-14 MED ORDER — DEXAMETHASONE 4 MG PO TABS
10.0000 mg | ORAL_TABLET | Freq: Once | ORAL | Status: AC
  Administered 2023-12-14: 10 mg via ORAL
  Filled 2023-12-14: qty 1

## 2023-12-14 MED ORDER — POTASSIUM CHLORIDE CRYS ER 20 MEQ PO TBCR
40.0000 meq | EXTENDED_RELEASE_TABLET | Freq: Once | ORAL | Status: AC
  Administered 2023-12-14: 40 meq via ORAL
  Filled 2023-12-14: qty 2

## 2023-12-14 NOTE — ED Triage Notes (Signed)
 [place] [facility] [place]. Pt reports having SOB for past month. Pt lung sounds clear in all fields. Pt denies any other complaints.   97% RA 130HR 162/90

## 2023-12-14 NOTE — Discharge Instructions (Addendum)
 Your evaluation today did not show any problems with your breathing or lung function.  However, your calcium level is a little high.  You need to follow-up with your primary care provider for further evaluation of the high calcium level.

## 2023-12-14 NOTE — ED Provider Notes (Signed)
 [place] EMERGENCY DEPARTMENT AT [facility] Provider Note   CSN: [id number] Arrival date & time: 12/14/23  0036     Patient presents with: Shortness of Breath   Mark [name] is a 63 y.o. male.   The history is provided by the patient.  Shortness of Breath  He has history of hypertension, COPD, schizophrenia and was brought in by ambulance because of shortness of breath.  Patient is a very poor historian, but apparently told EMS that he had been having shortness of breath for the last month.  He denies cough, fever, chills, chest pain.    Prior to Admission medications   Medication Sig Start Date End Date Taking? Authorizing Provider  amantadine  (SYMMETREL ) 100 MG capsule Take 1 capsule (100 mg total) by mouth 2 (two) times daily. 06/07/23   Remington, Amber E, NP  amLODipine  (NORVASC ) 5 MG tablet Take 1 tablet (5 mg total) by mouth daily. 06/07/23   Remington, Amber E, NP  aspirin  EC 81 MG tablet Take 1 tablet (81 mg total) by mouth daily. Swallow whole. 06/08/23   Remington, Amber E, NP  divalproex  (DEPAKOTE  ER) 500 MG 24 hr tablet Take 1 tablet (500 mg total) by mouth 2 (two) times daily. 06/08/23   Remington, Amber E, NP  gabapentin  (NEURONTIN ) 400 MG capsule Take 1 capsule (400 mg total) by mouth 2 (two) times daily. 06/07/23   Remington, Amber E, NP  hydrOXYzine  (ATARAX ) 25 MG tablet Take 1 tablet (25 mg total) by mouth 3 (three) times daily as needed (anxious). 06/07/23   Remington, Amber E, NP  methocarbamol  (ROBAXIN ) 500 MG tablet Take 1 tablet (500 mg total) by mouth every 8 (eight) hours as needed for muscle spasms. 06/27/23   Saunders Shona CROME, PA-C  naproxen  (NAPROSYN ) 500 MG tablet Take 1 tablet (500 mg total) by mouth 2 (two) times daily with a meal. 06/27/23   Saunders Shona CROME, PA-C  OLANZapine  (ZYPREXA ) 5 MG tablet Take 3 tablets (15 mg total) by mouth at bedtime. 06/07/23   Remington, Amber E, NP  traZODone  (DESYREL ) 100 MG tablet Take 1 tablet (100 mg total)  by mouth at bedtime as needed for sleep. 06/07/23   Remington, Amber E, NP  valbenazine  ([name] ) 40 MG capsule Take 1 capsule (40 mg total) by mouth daily. 06/08/23   Remington, Amber E, NP    Allergies: Patient has no known allergies.    Review of Systems  Respiratory:  Positive for shortness of breath.   All other systems reviewed and are negative.   Updated Vital Signs There were no vitals taken for this visit.  Physical Exam Vitals and nursing note reviewed.   63 year old male, appears mildly dyspneic at rest, but is in no acute distress. Vital signs are significant for elevated blood pressure. Oxygen saturation is 100%, which is normal. Head is normocephalic and atraumatic. PERRLA, EOMI. Oropharynx is clear. Neck is nontender and supple without adenopathy or JVD. Back is nontender and there is no CVA tenderness. Lungs are clear without rales, wheezes, or rhonchi. Chest is nontender. Heart has regular rate and rhythm without murmur. Abdomen is soft, flat, nontender. Extremities have no cyanosis or edema, full range of motion is present. Skin is warm and dry without rash. Neurologic: Awake and alert, cranial nerves are intact, moves all extremities equally.  (all labs ordered are listed, but only abnormal results are displayed) Labs Reviewed  BASIC METABOLIC PANEL WITH GFR - Abnormal; Notable for the following components:  Result Value   Potassium 3.3 (*)    Glucose, Bld 133 (*)    Calcium 11.5 (*)    All other components within normal limits  BRAIN NATRIURETIC PEPTIDE  CBC WITH DIFFERENTIAL/PLATELET  D-DIMER, QUANTITATIVE (NOT AT [facility]  TROPONIN I (HIGH SENSITIVITY)    EKG: EKG Interpretation Date/Time:  Thursday December 14 2023 00:47:05 EDT Ventricular Rate:  87 PR Interval:  159 QRS Duration:  84 QT Interval:  362 QTC Calculation: 436 R Axis:   82  Text Interpretation: Sinus rhythm Probable left atrial enlargement Borderline right axis deviation Probable  left ventricular hypertrophy When compared with ECG of 10/27/2023, No significant change was found Confirmed by Raford Lenis ([id number]) on 12/14/2023 12:52:37 AM  Radiology: [facility] Chest Port 1 View Result Date: 12/14/2023 CLINICAL DATA:  Shortness of breath EXAM: PORTABLE CHEST 1 VIEW COMPARISON:  10/27/2023 FINDINGS: The heart size and mediastinal contours are within normal limits. Both lungs are clear. The visualized skeletal structures are unremarkable. IMPRESSION: No active disease. Electronically Signed   By: Luke Bun M.D.   On: 12/14/2023 01:02     Procedures  Cardiac monitor shows normal sinus rhythm, per my interpretation. Medications Ordered in the ED  dexamethasone  (DECADRON ) tablet 10 mg (has no administration in time range)  potassium chloride  SA (KLOR-CON  M) CR tablet 40 mEq (has no administration in time range)                                    Medical Decision Making Amount and/or Complexity of Data Reviewed Labs: ordered. Radiology: ordered.  Risk Prescription drug management.   Complaints of shortness of breath with normal respiratory exam and normal oxygen saturation.  Differential diagnosis includes pneumonia, heart failure, pulmonary embolism, COPD exacerbation, subjective dyspnea.  I reviewed his electrocardiogram, and my interpretation is left ventricular hypertrophy, borderline right axis deviation, left atrial enlargement but unchanged from prior.  I have ordered laboratory workup and chest x-ray.  I have reviewed his past records, and note ED visit on 10/27/2023 where he presented with dyspnea and had negative workup including negative CT angiogram.  He was supposed to be on apixaban  at that time, not currently on anticoagulation.  Chest x-ray shows changes of COPD but no acute process.  I have independently viewed the image, and agree with the radiologist's interpretation.  I have reviewed his laboratory tests, and my interpretation is normal troponin, normal BNP,  normal D-dimer, normal CBC, metabolic panel significant for elevated random glucose level and mild hypokalemia and elevated calcium level.  I have ordered a dose of oral potassium.  Glucose will need to be followed as an outpatient.  I note calcium level has been elevated since 04/25/2023 and is higher today.  This will need to be evaluated as an outpatient.  I have told the patient about this and stressed the importance of following up with the primary care provider for further workup.  I have ordered a dose of dexamethasone  which would be helpful if he had a subclinical COPD exacerbation and I am discharging him with a prescription for oral potassium.     Final diagnoses:  Shortness of breath  Hypokalemia  Elevated random blood glucose level  Hypercalcemia    ED Discharge Orders          Ordered    potassium chloride  SA (KLOR-CON  M) 20 MEQ tablet  2 times daily  12/14/23 0156               Raford Lenis, MD 12/14/23 [phone]

## 2023-12-20 ENCOUNTER — Encounter (HOSPITAL_COMMUNITY): Payer: Self-pay | Admitting: Emergency Medicine

## 2023-12-20 ENCOUNTER — Encounter (HOSPITAL_COMMUNITY): Payer: Self-pay

## 2023-12-20 ENCOUNTER — Emergency Department (HOSPITAL_COMMUNITY)
Admission: EM | Admit: 2023-12-20 | Discharge: 2023-12-24 | Disposition: A | Discharge status: Home / Self Care | Payer: MEDICAID | Attending: Emergency Medicine | Admitting: Emergency Medicine

## 2023-12-20 ENCOUNTER — Other Ambulatory Visit: Payer: Self-pay

## 2023-12-20 ENCOUNTER — Emergency Department (HOSPITAL_COMMUNITY)
Admission: EM | Admit: 2023-12-20 | Discharge: 2023-12-20 | Discharge status: Against Medical Advice | Payer: MEDICAID | Source: Intra-hospital | Attending: Emergency Medicine | Admitting: Emergency Medicine

## 2023-12-20 DIAGNOSIS — Z7982 Long term (current) use of aspirin: Secondary | ICD-10-CM | POA: Insufficient documentation

## 2023-12-20 DIAGNOSIS — Z5321 Procedure and treatment not carried out due to patient leaving prior to being seen by health care provider: Secondary | ICD-10-CM | POA: Insufficient documentation

## 2023-12-20 DIAGNOSIS — M791 Myalgia, unspecified site: Secondary | ICD-10-CM | POA: Insufficient documentation

## 2023-12-20 DIAGNOSIS — R456 Violent behavior: Secondary | ICD-10-CM | POA: Insufficient documentation

## 2023-12-20 DIAGNOSIS — F209 Schizophrenia, unspecified: Secondary | ICD-10-CM | POA: Diagnosis not present

## 2023-12-20 DIAGNOSIS — R4182 Altered mental status, unspecified: Secondary | ICD-10-CM | POA: Diagnosis present

## 2023-12-20 DIAGNOSIS — R443 Hallucinations, unspecified: Secondary | ICD-10-CM

## 2023-12-20 DIAGNOSIS — R440 Auditory hallucinations: Secondary | ICD-10-CM | POA: Diagnosis present

## 2023-12-20 LAB — CBC WITH DIFFERENTIAL/PLATELET
Abs Immature Granulocytes: 0.02 K/uL (ref 0.00–0.07)
Basophils Absolute: 0 K/uL (ref 0.0–0.1)
Basophils Relative: 0 %
Eosinophils Absolute: 0.1 K/uL (ref 0.0–0.5)
Eosinophils Relative: 1 %
HCT: 46 % (ref 39.0–52.0)
Hemoglobin: 14.4 g/dL (ref 13.0–17.0)
Immature Granulocytes: 0 %
Lymphocytes Relative: 43 %
Lymphs Abs: 3.7 K/uL (ref 0.7–4.0)
MCH: 28.2 pg (ref 26.0–34.0)
MCHC: 31.3 g/dL (ref 30.0–36.0)
MCV: 90 fL (ref 80.0–100.0)
Monocytes Absolute: 0.8 K/uL (ref 0.1–1.0)
Monocytes Relative: 9 %
Neutro Abs: 4 K/uL (ref 1.7–7.7)
Neutrophils Relative %: 47 %
Platelets: 267 K/uL (ref 150–400)
RBC: 5.11 MIL/uL (ref 4.22–5.81)
RDW: 13.3 % (ref 11.5–15.5)
WBC: 8.6 K/uL (ref 4.0–10.5)
nRBC: 0 % (ref 0.0–0.2)

## 2023-12-20 LAB — COMPREHENSIVE METABOLIC PANEL WITH GFR
ALT: 16 U/L (ref 0–44)
AST: 22 U/L (ref 15–41)
Albumin: 4.3 g/dL (ref 3.5–5.0)
Alkaline Phosphatase: 68 U/L (ref 38–126)
Anion gap: 9 (ref 5–15)
BUN: 18 mg/dL (ref 8–23)
CO2: 24 mmol/L (ref 22–32)
Calcium: 10.6 mg/dL — ABNORMAL HIGH (ref 8.9–10.3)
Chloride: 107 mmol/L (ref 98–111)
Creatinine, Ser: 1.31 mg/dL — ABNORMAL HIGH (ref 0.61–1.24)
GFR, Estimated: >60 mL/min (ref >60)
Glucose, Bld: 116 mg/dL — ABNORMAL HIGH (ref 70–99)
Potassium: 3.6 mmol/L (ref 3.5–5.1)
Sodium: 140 mmol/L (ref 135–145)
Total Bilirubin: 1.5 mg/dL — ABNORMAL HIGH (ref 0.0–1.2)
Total Protein: 8 g/dL (ref 6.5–8.1)

## 2023-12-20 LAB — ETHANOL: Alcohol, Ethyl (B): <15 mg/dL (ref <15)

## 2023-12-20 MED ORDER — DIPHENHYDRAMINE HCL 50 MG/ML IJ SOLN
50.0000 mg | Freq: Once | INTRAMUSCULAR | Status: DC
  Filled 2023-12-20: qty 1

## 2023-12-20 MED ORDER — ASPIRIN 81 MG PO TBEC
81.0000 mg | DELAYED_RELEASE_TABLET | Freq: Every day | ORAL | Status: DC
  Administered 2023-12-21 – 2023-12-24 (×4): 81 mg via ORAL
  Filled 2023-12-20 (×4): qty 1

## 2023-12-20 MED ORDER — NAPROXEN 500 MG PO TABS
500.0000 mg | ORAL_TABLET | Freq: Once | ORAL | Status: AC
  Administered 2023-12-20 (×2): 500 mg via ORAL
  Filled 2023-12-20: qty 1

## 2023-12-20 MED ORDER — AMLODIPINE BESYLATE 5 MG PO TABS
5.0000 mg | ORAL_TABLET | Freq: Every day | ORAL | Status: DC
  Administered 2023-12-21 – 2023-12-24 (×4): 5 mg via ORAL
  Filled 2023-12-20 (×4): qty 1

## 2023-12-20 MED ORDER — HYDROXYZINE HCL 25 MG PO TABS
25.0000 mg | ORAL_TABLET | Freq: Three times a day (TID) | ORAL | Status: DC | PRN
  Administered 2023-12-21 – 2023-12-23 (×3): 25 mg via ORAL
  Filled 2023-12-20 (×3): qty 1

## 2023-12-20 MED ORDER — OLANZAPINE 5 MG PO TABS
5.0000 mg | ORAL_TABLET | Freq: Every day | ORAL | Status: DC
  Administered 2023-12-20 (×2): 5 mg via ORAL
  Filled 2023-12-20: qty 1

## 2023-12-20 MED ORDER — DIVALPROEX SODIUM ER 500 MG PO TB24
500.0000 mg | ORAL_TABLET | Freq: Two times a day (BID) | ORAL | Status: DC
  Administered 2023-12-21 – 2023-12-24 (×7): 500 mg via ORAL
  Filled 2023-12-20 (×7): qty 1

## 2023-12-20 MED ORDER — LORAZEPAM 1 MG PO TABS
2.0000 mg | ORAL_TABLET | Freq: Once | ORAL | Status: AC
  Administered 2023-12-20 (×2): 2 mg via ORAL
  Filled 2023-12-20: qty 2

## 2023-12-20 MED ORDER — GABAPENTIN 400 MG PO CAPS
400.0000 mg | ORAL_CAPSULE | Freq: Two times a day (BID) | ORAL | Status: DC
  Administered 2023-12-21 – 2023-12-24 (×7): 400 mg via ORAL
  Filled 2023-12-20 (×7): qty 1

## 2023-12-20 MED ORDER — HALOPERIDOL LACTATE 5 MG/ML IJ SOLN
5.0000 mg | Freq: Once | INTRAMUSCULAR | Status: DC
  Filled 2023-12-20: qty 1

## 2023-12-20 MED ORDER — LORAZEPAM 2 MG/ML IJ SOLN: 2.0000 mg | Freq: Once | INTRAMUSCULAR | Status: DC

## 2023-12-20 NOTE — ED Notes (Signed)
 Patients belongings has been placed in [address] [employer] [address] ) cabinets. 2 belongings bags

## 2023-12-20 NOTE — ED Notes (Signed)
 Will attempt to get this patient EKG at a later time

## 2023-12-20 NOTE — ED Notes (Signed)
 While in triage, pt states I wanna go on out, can I get a sandwich?. Pt states he doesn't want to be seen by a provider now. Given a sandwich and diet drink, left to lobby

## 2023-12-20 NOTE — ED Notes (Signed)
 Pt sitting in chair with auditory hallucinations. Pt talking and yelling with someone not there. Md aware
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
## 2023-12-20 NOTE — ED Notes (Addendum)
 This NT was informed that this patient went off the unit with [employer].

## 2023-12-20 NOTE — ED Triage Notes (Signed)
 Pt BIB [facility] found on sidewalk. EMS reports pt was found talking to himself. Pt was initially [facility] of jail and was taken to [facility] this a.m. by [employer] for pysch eval. Speech incomprehensible at times. EMS states he was having hallucinations on truck. Hx of schizophrenia and aggressive behaviors.

## 2023-12-20 NOTE — ED Notes (Signed)
 Patient went off the unit with [employer].

## 2023-12-20 NOTE — ED Notes (Signed)
 This NT was informed that this patient came back to the unit with [employer].

## 2023-12-20 NOTE — ED Notes (Signed)
 Pt said he wants to go to another hospital [facility] to be exact

## 2023-12-20 NOTE — ED Notes (Signed)
 Patient has dressed out in purple scrubs

## 2023-12-20 NOTE — ED Notes (Signed)
 Patient has return back on the unit with [employer]

## 2023-12-20 NOTE — BH Assessment (Addendum)
 Patient had been given 2mg  oral Ativan  at 21:23.  Clinician contacted RN Regino to see if he could be assessed.  He is currently too sedated to participate in teleassessment.  TTS will try again later.

## 2023-12-20 NOTE — ED Notes (Signed)
 Pt resting on the stretcher.

## 2023-12-21 DIAGNOSIS — F209 Schizophrenia, unspecified: Secondary | ICD-10-CM

## 2023-12-21 LAB — RAPID URINE DRUG SCREEN, HOSP PERFORMED
Amphetamines: NOT DETECTED
Barbiturates: NOT DETECTED
Benzodiazepines: POSITIVE — AB
Cocaine: NOT DETECTED
Opiates: NOT DETECTED
Tetrahydrocannabinol: NOT DETECTED

## 2023-12-21 MED ORDER — OLANZAPINE 5 MG PO TBDP
5.0000 mg | ORAL_TABLET | Freq: Two times a day (BID) | ORAL | Status: DC
  Administered 2023-12-21 – 2023-12-24 (×7): 5 mg via ORAL
  Filled 2023-12-21 (×7): qty 1

## 2023-12-21 NOTE — Consult Note (Signed)
 [facility] Psychiatric Consult Initial  Patient Name: .Mark [name]  MRN: [id number]  DOB: Jan 22, 1961  Consult Order details:  Orders (From admission, onward)     Start     Ordered   12/20/23 2207  CONSULT TO CALL ACT TEAM       Ordering Provider: Bari Roxie HERO, DO  Provider:  (Not yet assigned)  Question:  Reason for Consult?  Answer:  Active auditory and visual hallucinations, aggressive, danger to self, psychosis, under IVC   12/20/23 2206             Mode of Visit: In person    Psychiatry Consult Evaluation  Service Date: December 21, 2023 LOS:  LOS: 0 days  Chief Complaint Possible hallucinations; found walking on sidewalk.  Primary Psychiatric Diagnoses  Schizophrenia 2.   Aggressive behaviors  Assessment  Mark [name] is a 63 y.o. male admitted: Presented to the EDfor 12/20/2023  3:56 PM for possible hallucinations; found walking on sidewalk.[name] He carries the psychiatric diagnoses of schizophrenia and aggressive behaviors and has a past medical history of tremors.   63 year old male with chronic schizophrenia, current homelessness, medication noncompliance, and recent disorganized/delusional thinking. His lack of insight, inability to care for himself, and history of aggressive behaviors indicate high risk for psychiatric deterioration without structured care. Please see plan below for detailed recommendations.   Diagnoses:  Active Hospital problems: Principal Problem:   Schizophrenia (HCC) Active Problems:   AMS (altered mental status)    Plan   ## Psychiatric Medication Recommendations:  Start Zyprexa  5 mg PO BID for mood/psychosis Give Atarax  25 mg PO TID as needed for anxiety Give Depakote  ER 500 mg PO BID for mood   ## Medical Decision Making Capacity: Not specifically addressed in this encounter  ## Further Work-up:  -- No further workup needed at this time TSH, B12, folate, EKG, While pt on Qtc prolonging medications, please monitor &  replete K+ to 4 and Mg2+ to 2, or UDS -- most recent EKG on 12/21/2023 had QtC of 443 -- Pertinent labwork reviewed earlier this admission includes: CBC, CMP, EKG, UDS   ## Disposition:-- We recommend inpatient psychiatric hospitalization. Patient has been involuntarily committed on 12/20/2023.   ## Behavioral / Environmental: - No specific recommendations at this time. , To minimize splitting of staff, assign one staff person to communicate all information from the team when feasible., or Utilize compassion and acknowledge the patient's experiences while setting clear and realistic expectations for care.    ## Safety and Observation Level:  - Based on my clinical evaluation, I estimate the patient to be at low risk of self harm in the current setting. - At this time, we recommend  routine. This decision is based on my review of the chart including patient's history and current presentation, interview of the patient, mental status examination, and consideration of suicide risk including evaluating suicidal ideation, plan, intent, suicidal or self-harm behaviors, risk factors, and protective factors. This judgment is based on our ability to directly address suicide risk, implement suicide prevention strategies, and develop a safety plan while the patient is in the clinical setting. Please contact our team if there is a concern that risk level has changed.  CSSR Risk Category:   Suicide Risk Assessment: Patient has following modifiable risk factors for suicide: medication noncompliance, lack of access to outpatient mental health resources, and active mental illness (to encompass adhd, tbi, mania, psychosis, trauma reaction), which we are addressing by recommending inpatient psychiatric  admission. Patient has following non-modifiable or demographic risk factors for suicide: male gender and psychiatric hospitalization Patient has the following protective factors against suicide: Supportive  friends  Thank you for this consult request. Recommendations have been communicated to the primary team.  We will continue to follow patient at this time.   Mark [name], PMHNP       History of Present Illness  Relevant Aspects of Hospital ED Course:  Admitted on 12/20/2023 for Possible hallucinations; found walking on sidewalk.  Patient Report:  The patient is a 63 year old male with a known history of schizophrenia and prior aggressive behaviors, brought to the ED after being found walking on a sidewalk with concerns for possible hallucinations. On assessment, patient reports experiencing "delusional thoughts" but is unable to elaborate.  He denies suicidal ideation (SI), homicidal ideation (HI), and current substance use. Urine drug screen (UDS) and blood alcohol level (BAL) are negative. Patient is unable to recall his psychiatric medications. Speech is intermittently difficult to understand, which is reportedly his baseline. He denies acute medical complaints.  The patient is homeless and noncompliant with psychiatric medications. Chart review notes prior psychiatric admission to [facility] on 04/27/2023 for aggressive behaviors and decompensating schizophrenia.  Psych ROS:  Depression: Denies  Anxiety:   Denies  Mania (lifetime and current):  Denies  Psychosis: (lifetime and current): Currently   Collateral information:  Contacted None   Review of Systems  Psychiatric/Behavioral:  Positive for hallucinations.        Delusional thoughts      Psychiatric and Social History  Psychiatric History:  Information collected from patient   Prev Dx/Sx: Schizophrenia, Paranoid type, Chronic Current Psych Provider: Teaching laboratory technician as of last year.  Patient does not remember who he sees. Home Meds (current): Depakote , Haldol , Cogentin , Trazodone , [name] , Gabapentin  Previous Med Trials: Depakote , clozapine , Haldol , Cogentin , gabapentin , trazodone , Vistaril    Therapy: na   Prior Psych Hospitalization: yes, [facility], [facility] plus more  Prior Self Harm: Denies Prior Violence: Threatens father and other family members.   Family Psych History: unknown  Family Hx suicide: denies   Social History:  Developmental Hx: unknown, will defer to father who is legal guardian Educational Hx: patient does not remember where he stopped in school. Occupational Hx: unemployed, Armed forces operational officer Hx: denies, Multiple IVC to hospitals  Living Situation: Lives with his father Spiritual Hx: denies Access to weapons/lethal means: denies    Substance History Alcohol: denies  Type of alcohol na Last Drink na Number of drinks per day na History of alcohol withdrawal seizures na History of DT's na Tobacco: na Illicit drugs: reports occasional use for Cannabis Prescription drug abuse: denies Rehab hx: denies  Exam Findings  Physical Exam:  Vital Signs:  Temp:  [97.5 F (36.4 C)-97.6 F (36.4 C)] 97.6 F (36.4 C) (08/14 0738) Pulse Rate:  [65-84] 65 (08/14 0738) Resp:  [16-20] 20 (08/14 0738) BP: (111-132)/(69-91) 111/69 (08/14 0738) SpO2:  [100 %] 100 % (08/14 0738) Weight:  [76.2 kg] 76.2 kg (08/13 1612) Blood pressure 111/69, pulse 65, temperature 97.6 F (36.4 C), temperature source Oral, resp. rate 20, height 5' 10 (1.778 m), weight 76.2 kg, SpO2 100%. Body mass index is 24.1 kg/m.  Physical Exam Vitals and nursing note reviewed. Exam conducted with a chaperone present.  Neurological:     Mental Status: He is alert.  Psychiatric:        Attention and Perception: He is inattentive.        Mood and Affect: Affect  is flat.        Speech: Speech is rapid and pressured.        Behavior: Behavior is cooperative.        Thought Content: Thought content is delusional.        Cognition and Memory: Cognition is impaired.        Judgment: Judgment is inappropriate.     Mental Status Exam: General Appearance: Casual and Fairly Groomed  Orientation:  Full (Time,  Place, and Person)  Memory:  Immediate;   Fair Recent;   Fair Remote;   Fair  Concentration:  Concentration: Fair and Attention Span: Fair  Recall:  Fair  Attention  Good  Eye Contact:  Good  Speech:  Garbled  Language:  Fair  Volume:  Normal  Mood: anxious  Affect:  Congruent  Thought Process: Disorganized at times  Thought Content: Reports "delusional thoughts" without elaboration; denies SI/HI  Suicidal Thoughts:  No  Homicidal Thoughts:  No  Judgement: Poor  Insight: Poor   Psychomotor Activity:  TD and Tremor  Akathisia:  NA  Fund of Knowledge:  Fair       Assets:  Manufacturing systems engineer Desire for Improvement Housing Physical Health Resilience Social Support  Cognition:  WNL  ADL's:  Intact  AIMS (if indicated):        Other History   These have been pulled in through the EMR, reviewed, and updated if appropriate.  Family History:  The patient's family history is not on file.  Medical History: Past Medical History:  Diagnosis Date  . COPD (chronic obstructive pulmonary disease) (HCC)   . Hypertension   . Schizophrenia [facility])     Surgical History: Past Surgical History:  Procedure Laterality Date  . LOWER EXTREMITY ANGIOGRAPHY N/A 09/18/2017   Procedure: LOWER EXTREMITY ANGIOGRAPHY- Recheck lysis;  Surgeon: Sheree Penne Bruckner, MD;  Location: [facility] INVASIVE CV LAB;  Service: Cardiovascular;  Laterality: N/A;  . PERIPHERAL VASCULAR INTERVENTION Left 09/18/2017   Procedure: PERIPHERAL VASCULAR INTERVENTION;  Surgeon: Sheree Penne Bruckner, MD;  Location: [facility] INVASIVE CV LAB;  Service: Cardiovascular;  Laterality: Left;  . THROMBECTOMY FEMORAL ARTERY Left 09/17/2017   Procedure: POSSIBLE THROMBECTOMY;  Surgeon: Sheree Penne Bruckner, MD;  Location[facility] OR;  Service: Vascular;  Laterality: Left;  [name] VENOGRAM Left 09/17/2017   Procedure: ULTRASOUND POPLITEAL ACCESS; CENTRAL VENOGRAM, IVVS, LYSIS CATHETER PLACEMENT;  Surgeon: Sheree Penne Bruckner, MD;   Location: [facility] OR;  Service: Vascular;  Laterality: Left;     Medications:   Current Facility-Administered Medications:  .  amLODipine  (NORVASC ) tablet 5 mg, 5 mg, Oral, Daily, Horton, Kristie M, DO, 5 mg at 12/21/23 9071 .  aspirin  EC tablet 81 mg, 81 mg, Oral, Daily, Horton, Kristie M, DO, 81 mg at 12/21/23 9071 .  divalproex  (DEPAKOTE  ER) 24 hr tablet 500 mg, 500 mg, Oral, BID, Horton, Kristie M, DO, 500 mg at 12/21/23 0704 .  gabapentin  (NEURONTIN ) capsule 400 mg, 400 mg, Oral, BID, Horton, Kristie M, DO, 400 mg at 12/21/23 9071 .  hydrOXYzine  (ATARAX ) tablet 25 mg, 25 mg, Oral, TID PRN, Horton, Kristie M, DO  Current Outpatient Medications:  .  amantadine  (SYMMETREL ) 100 MG capsule, Take 1 capsule (100 mg total) by mouth 2 (two) times daily., Disp: 60 capsule, Rfl: 0 .  amLODipine  (NORVASC ) 5 MG tablet, Take 1 tablet (5 mg total) by mouth daily., Disp: 30 tablet, Rfl: 0 .  aspirin  EC 81 MG tablet, Take 1 tablet (81 mg total) by mouth daily. Swallow  whole., Disp: 30 tablet, Rfl: 12 .  divalproex  (DEPAKOTE  ER) 500 MG 24 hr tablet, Take 1 tablet (500 mg total) by mouth 2 (two) times daily., Disp: 60 tablet, Rfl: 0 .  gabapentin  (NEURONTIN ) 400 MG capsule, Take 1 capsule (400 mg total) by mouth 2 (two) times daily., Disp: 60 capsule, Rfl: 0 .  hydrOXYzine  (ATARAX ) 25 MG tablet, Take 1 tablet (25 mg total) by mouth 3 (three) times daily as needed (anxious)., Disp: 30 tablet, Rfl: 0 .  methocarbamol  (ROBAXIN ) 500 MG tablet, Take 1 tablet (500 mg total) by mouth every 8 (eight) hours as needed for muscle spasms., Disp: 4 tablet, Rfl: 0 .  naproxen  (NAPROSYN ) 500 MG tablet, Take 1 tablet (500 mg total) by mouth 2 (two) times daily with a meal., Disp: 20 tablet, Rfl: 0 .  OLANZapine  (ZYPREXA ) 5 MG tablet, Take 3 tablets (15 mg total) by mouth at bedtime., Disp: 90 tablet, Rfl: 0 .  potassium chloride  SA (KLOR-CON  M) 20 MEQ tablet, Take 1 tablet (20 mEq total) by mouth 2 (two) times daily., Disp: 10  tablet, Rfl: 0 .  traZODone  (DESYREL ) 100 MG tablet, Take 1 tablet (100 mg total) by mouth at bedtime as needed for sleep., Disp: 30 tablet, Rfl: 0 .  valbenazine  ([name] ) 40 MG capsule, Take 1 capsule (40 mg total) by mouth daily., Disp: 30 capsule, Rfl: 0  Allergies: No Known Allergies  Sophiah Rolin MOTLEY-MANGRUM, PMHNP

## 2023-12-21 NOTE — BH Assessment (Addendum)
 Clinician checked with NT Breanna and she said that patient is still sleeping soundly.  Pt will be seen on dayshift by psychiatry.

## 2023-12-21 NOTE — ED Notes (Signed)
 TTS provider reached out to see if pt is awake and ready to consult. Pt is still asleep. Provider has been notified.

## 2023-12-21 NOTE — ED Provider Notes (Signed)
 Emergency Medicine Observation Re-evaluation Note  Mark [name] is a 63 y.o. male, seen on rounds today.  Pt initially presented to the ED for complaints of Hallucinations Currently, the patient is sitting in bed and does not have any complaints.  Physical Exam  BP 111/69 (BP Location: Right Arm)   Pulse 65   Temp 97.6 F (36.4 C) (Oral)   Resp 20   Ht 5' 10 (1.778 m)   Wt 76.2 kg   SpO2 100%   BMI 24.10 kg/m  Physical Exam General: nad Cardiac: regular Lungs: clear Psych: cooperative and calm at this time  ED Course / MDM  EKG:   I have reviewed the labs performed to date as well as medications administered while in observation.  Recent changes in the last 24 hours include none.  Plan  Current plan is for waiting on psych assessment.    Doretha Folks, MD 12/21/23 [phone]

## 2023-12-22 MED ORDER — ALUM & MAG HYDROXIDE-SIMETH 200-200-20 MG/5ML PO SUSP
30.0000 mL | Freq: Four times a day (QID) | ORAL | Status: DC | PRN
  Administered 2023-12-22: 30 mL via ORAL
  Filled 2023-12-22: qty 30

## 2023-12-22 NOTE — ED Notes (Signed)
 This NT was given report by off-going NT. Pt currently resting in hall bed, normal chest rise and fall noted.

## 2023-12-22 NOTE — Progress Notes (Signed)
 Inpatient Psychiatric Referral  Patient was recommended inpatient per Jadeka Motley-Mangrum, NP. There are no available beds at [facility], per [facility]. Patient was referred to the following out of network facilities:  Destination  Service Provider Request Status Address Phone Fax  [facility]  Pending - Request Sent [address], [place] [address] [phone]  [facility]  Pending - Request Sent [address] [place] [address] [phone]  [facility]  Pending - Request Sent [address] [place] [address] [phone]  [facility]  Pending - Request Sent [address], [place] [address] [phone]  [facility]  Pending - Request Sent [address] [place] [address] [phone]  [facility]  Pending - Request Sent [address] [place] [phone]  [facility]  Pending - Request Sent [address] [place] [address] [phone]  [facility]  Pending - Request Sent [address] [place] [address] [phone]    Situation ongoing, CSW to continue following and update chart as more information becomes available.   Harrie Sofia MSW, LCSWA 12/22/2023  12:36PM

## 2023-12-22 NOTE — Progress Notes (Signed)
 Pt has been accepted to [facility] on 12/22/2023 Bed assignment: Main campus  Pt meets inpatient criteria per [name]   Attending Physician will be Millie Manners, MD  Report can be called to: [phone] (this is a pager, please leave call-back number when giving report)  Pt can arrive after asap  Care Team Notified: Landry Orn, RN, Chesley Holt

## 2023-12-22 NOTE — ED Notes (Signed)
 Patient to room 30.   Patient ambulated to room. Patient oriented to unit and room. Patient  cooperative.

## 2023-12-22 NOTE — ED Provider Notes (Signed)
 Emergency Medicine Observation Re-evaluation Note  Mark [name] is a 63 y.o. male, seen on rounds today.  Pt initially presented to the ED for complaints of Hallucinations Currently, the patient is resting.  Physical Exam  BP 122/86   Pulse 60   Temp 97.8 F (36.6 C)   Resp 18   Ht 5' 10 (1.778 m)   Wt 76.2 kg   SpO2 96%   BMI 24.10 kg/m  Physical Exam General: NAD   ED Course / MDM  EKG:   I have reviewed the labs performed to date as well as medications administered while in observation.  Recent changes in the last 24 hours include no acute events reported.  Plan  Current plan is for placement.    Mark Maude BROCKS, MD 12/22/23 [phone]

## 2023-12-23 NOTE — ED Provider Notes (Signed)
 Emergency Medicine Observation Re-evaluation Note  Mark [name] is a 63 y.o. male, seen on rounds today.  Pt initially presented to the ED for complaints of Hallucinations Currently, the patient is eating, watching TV. No acute events overnight.   Physical Exam  BP 120/82 (BP Location: Right Arm)   Pulse 68   Temp 98.1 F (36.7 C) (Oral)   Resp 16   Ht 5' 10 (1.778 m)   Wt 76.2 kg   SpO2 100%   BMI 24.10 kg/m  Physical Exam General: NAD Lungs: No respiratory distress Psych: Calm, cooperative  ED Course / MDM  EKG:EKG Interpretation Date/Time:  Thursday December 21 2023 09:01:42 EDT Ventricular Rate:  65 PR Interval:  170 QRS Duration:  98 QT Interval:  426 QTC Calculation: 443 R Axis:   76  Text Interpretation: Normal sinus rhythm Moderate voltage criteria for LVH, may be normal variant ( Sokolow-Lyon , Cornell product ) Borderline ECG When compared with ECG of 14-Dec-2023 00:47, PREVIOUS ECG IS PRESENT Confirmed by Cleotilde Rogue ([id number]) on 12/22/2023 8:26:10 PM  I have reviewed the labs performed to date as well as medications administered while in observation.  Recent changes in the last 24 hours include patient accepted to bed at [facility].  Plan  Current plan is for patient will go to [facility] today.     Gennaro Duwaine CROME, DO 12/23/23 [phone]

## 2023-12-23 NOTE — ED Notes (Signed)
 Patient has been alert this shift. Patient medication compliant.  Patient quiet and guarded. Patient can do all ADLs.

## 2023-12-24 NOTE — ED Provider Notes (Signed)
 Emergency Medicine Observation Re-evaluation Note  Mark [name] is a 63 y.o. male, seen on rounds today.  Pt initially presented to the ED for complaints of Hallucinations Currently, the patient is resting.  Physical Exam  BP (!) 148/63 (BP Location: Right Arm)   Pulse 61   Temp 97.8 F (36.6 C) (Oral)   Resp 18   Ht 1.778 m (5' 10)   Wt 76.2 kg   SpO2 100%   BMI 24.10 kg/m  Physical Exam General: No acute distress Cardiac: Regular rate   ED Course / MDM    I have reviewed the labs performed to date as well as medications administered while in observation.  Recent changes in the last 24 hours include no behavioral events yesterday.  Plan  Current plan is for inpatient treatment at [name].  Plan is for transport today.    Randol Simmonds, MD 12/24/23 [phone]

## 2023-12-24 NOTE — ED Notes (Signed)
 Lm for [employer] reg transporting pt to [facility].

## 2023-12-24 NOTE — ED Notes (Signed)
 Report given to Mica Elgin,RN at [facility].
